# Patient Record
Sex: Male | Born: 1960 | Race: Black or African American | Hispanic: No | Marital: Single | Smoking: Former smoker
Health system: Southern US, Community
[De-identification: ages and names within clinical notes are randomized; demographics above are authoritative.]

## PROBLEM LIST (undated history)

## (undated) DIAGNOSIS — M1990 Unspecified osteoarthritis, unspecified site: Secondary | ICD-10-CM

## (undated) DIAGNOSIS — E119 Type 2 diabetes mellitus without complications: Secondary | ICD-10-CM

## (undated) DIAGNOSIS — I10 Essential (primary) hypertension: Secondary | ICD-10-CM

## (undated) DIAGNOSIS — K219 Gastro-esophageal reflux disease without esophagitis: Secondary | ICD-10-CM

## (undated) DIAGNOSIS — F419 Anxiety disorder, unspecified: Secondary | ICD-10-CM

## (undated) DIAGNOSIS — E785 Hyperlipidemia, unspecified: Secondary | ICD-10-CM

## (undated) DIAGNOSIS — Z449 Encounter for fitting and adjustment of unspecified external prosthetic device: Secondary | ICD-10-CM

## (undated) DIAGNOSIS — M8580 Other specified disorders of bone density and structure, unspecified site: Secondary | ICD-10-CM

## (undated) DIAGNOSIS — G709 Myoneural disorder, unspecified: Secondary | ICD-10-CM

## (undated) DIAGNOSIS — T7840XA Allergy, unspecified, initial encounter: Secondary | ICD-10-CM

## (undated) DIAGNOSIS — K759 Inflammatory liver disease, unspecified: Secondary | ICD-10-CM

## (undated) HISTORY — DX: Other specified disorders of bone density and structure, unspecified site: M85.80

## (undated) HISTORY — DX: Anxiety disorder, unspecified: F41.9

## (undated) HISTORY — PX: OTHER SURGICAL HISTORY: SHX169

## (undated) HISTORY — DX: Encounter for fitting and adjustment of unspecified external prosthetic device: Z44.9

## (undated) HISTORY — DX: Gastro-esophageal reflux disease without esophagitis: K21.9

## (undated) HISTORY — DX: Unspecified osteoarthritis, unspecified site: M19.90

## (undated) HISTORY — DX: Myoneural disorder, unspecified: G70.9

## (undated) HISTORY — PX: HERNIA REPAIR: SHX51

## (undated) HISTORY — DX: Hyperlipidemia, unspecified: E78.5

## (undated) HISTORY — DX: Allergy, unspecified, initial encounter: T78.40XA

---

## 2012-05-18 ENCOUNTER — Emergency Department (HOSPITAL_BASED_OUTPATIENT_CLINIC_OR_DEPARTMENT_OTHER)
Admission: EM | Admit: 2012-05-18 | Discharge: 2012-05-19 | Disposition: A | Discharge status: Home / Self Care | Payer: Medicare HMO | Attending: Emergency Medicine | Admitting: Emergency Medicine

## 2012-05-18 ENCOUNTER — Emergency Department (HOSPITAL_BASED_OUTPATIENT_CLINIC_OR_DEPARTMENT_OTHER): Payer: Medicare HMO

## 2012-05-18 ENCOUNTER — Encounter (HOSPITAL_BASED_OUTPATIENT_CLINIC_OR_DEPARTMENT_OTHER): Payer: Self-pay | Admitting: *Deleted

## 2012-05-18 DIAGNOSIS — E1049 Type 1 diabetes mellitus with other diabetic neurological complication: Secondary | ICD-10-CM | POA: Insufficient documentation

## 2012-05-18 DIAGNOSIS — G629 Polyneuropathy, unspecified: Secondary | ICD-10-CM

## 2012-05-18 DIAGNOSIS — I10 Essential (primary) hypertension: Secondary | ICD-10-CM | POA: Insufficient documentation

## 2012-05-18 DIAGNOSIS — Z79899 Other long term (current) drug therapy: Secondary | ICD-10-CM | POA: Insufficient documentation

## 2012-05-18 DIAGNOSIS — R7989 Other specified abnormal findings of blood chemistry: Secondary | ICD-10-CM | POA: Insufficient documentation

## 2012-05-18 DIAGNOSIS — E1142 Type 2 diabetes mellitus with diabetic polyneuropathy: Secondary | ICD-10-CM | POA: Insufficient documentation

## 2012-05-18 DIAGNOSIS — Z87891 Personal history of nicotine dependence: Secondary | ICD-10-CM | POA: Insufficient documentation

## 2012-05-18 DIAGNOSIS — E1069 Type 1 diabetes mellitus with other specified complication: Secondary | ICD-10-CM | POA: Insufficient documentation

## 2012-05-18 DIAGNOSIS — R739 Hyperglycemia, unspecified: Secondary | ICD-10-CM

## 2012-05-18 HISTORY — DX: Type 2 diabetes mellitus without complications: E11.9

## 2012-05-18 HISTORY — DX: Essential (primary) hypertension: I10

## 2012-05-18 LAB — GLUCOSE, CAPILLARY
Glucose-Capillary: 276 mg/dL — ABNORMAL HIGH (ref 70–99)
Glucose-Capillary: 343 mg/dL — ABNORMAL HIGH (ref 70–99)
Glucose-Capillary: 458 mg/dL — ABNORMAL HIGH (ref 70–99)
Glucose-Capillary: 471 mg/dL — ABNORMAL HIGH (ref 70–99)

## 2012-05-18 LAB — RAPID URINE DRUG SCREEN, HOSP PERFORMED
Amphetamines: NOT DETECTED
Benzodiazepines: NOT DETECTED
Cocaine: NOT DETECTED
Opiates: POSITIVE — AB

## 2012-05-18 LAB — COMPREHENSIVE METABOLIC PANEL
Albumin: 3.6 g/dL (ref 3.5–5.2)
Alkaline Phosphatase: 104 U/L (ref 39–117)
BUN: 14 mg/dL (ref 6–23)
Potassium: 3.9 mEq/L (ref 3.5–5.1)
Total Protein: 8.4 g/dL — ABNORMAL HIGH (ref 6.0–8.3)

## 2012-05-18 LAB — URINALYSIS, ROUTINE W REFLEX MICROSCOPIC
Glucose, UA: >1000 mg/dL — AB
Ketones, ur: 15 mg/dL — AB
Protein, ur: 100 mg/dL — AB

## 2012-05-18 LAB — CBC WITH DIFFERENTIAL/PLATELET
Basophils Absolute: 0.1 10*3/uL (ref 0.0–0.1)
Eosinophils Absolute: 0.1 10*3/uL (ref 0.0–0.7)
HCT: 38 % — ABNORMAL LOW (ref 39.0–52.0)
Lymphocytes Relative: 22 % (ref 12–46)
MCHC: 35 g/dL (ref 30.0–36.0)
Neutro Abs: 3.3 10*3/uL (ref 1.7–7.7)
Neutrophils Relative %: 59 % (ref 43–77)
RDW: 12.3 % (ref 11.5–15.5)

## 2012-05-18 LAB — ETHANOL: Alcohol, Ethyl (B): <11 mg/dL (ref 0–11)

## 2012-05-18 MED ORDER — OXYCODONE HCL 5 MG PO TABS: 5.0000 mg | ORAL_TABLET | ORAL | Status: DC | PRN

## 2012-05-18 MED ORDER — SODIUM CHLORIDE 0.9 % IV SOLN
1000.0000 mL | Freq: Once | INTRAVENOUS | Status: AC
  Administered 2012-05-18: 1000 mL via INTRAVENOUS

## 2012-05-18 MED ORDER — SODIUM CHLORIDE 0.9 % IV SOLN
Freq: Once | INTRAVENOUS | Status: AC
  Administered 2012-05-18: 1000 mL via INTRAVENOUS

## 2012-05-18 MED ORDER — SODIUM CHLORIDE 0.9 % IV SOLN
INTRAVENOUS | Status: DC
  Administered 2012-05-18: 100 [IU]/h via INTRAVENOUS

## 2012-05-18 MED ORDER — INSULIN REGULAR HUMAN 100 UNIT/ML IJ SOLN
INTRAMUSCULAR | Status: AC
  Filled 2012-05-18: qty 10

## 2012-05-18 MED ORDER — SODIUM CHLORIDE 0.9 % IV SOLN
1000.0000 mL | INTRAVENOUS | Status: DC
  Administered 2012-05-19: 1000 mL via INTRAVENOUS

## 2012-05-18 NOTE — ED Provider Notes (Signed)
History     CSN: [id number]  Arrival date & time 05/18/12  1758   First MD Initiated Contact with Patient 05/18/12 1814      Chief Complaint  Patient presents with  . Hyperglycemia    (Consider location/radiation/quality/duration/timing/severity/associated sxs/prior treatment) Patient is a 51 y.o. male presenting with diabetes problem. The history is provided by the patient. No language interpreter was used.  Diabetes He presents for his initial diabetic visit. He has type 1 diabetes mellitus.  Pt reports his monitor is reading high.   Pt reports he has been losing weight.   Pt reports he is having a lot of pain from nerve damage in his legs.  (Pt is in between doctors)   [facility] but [facility] will not manage his medications any longer (?dismissed)  He is scheduled to see [place] MD in November.  Family member reports pt is suffering in pain and has been confused and having trouble walking.   (Pt reports he can not be admitted because he has court tomorrow and has to appear  Past Medical History  Diagnosis Date  . Diabetes mellitus without complication   . Hypertension     History reviewed. No pertinent past surgical history.  No family history on file.  History  Substance Use Topics  . Smoking status: Former Games developer  . Smokeless tobacco: Not on file  . Alcohol Use: Yes      Review of Systems  All other systems reviewed and are negative.    Allergies  Review of patient's allergies indicates no known allergies.  Home Medications   Current Outpatient Rx  Name Route Sig Dispense Refill  . ALPRAZOLAM 1 MG PO TABS Oral Take 1 mg by mouth at bedtime as needed.    Marland Kitchen AMLODIPINE BESYLATE 10 MG PO TABS Oral Take 10 mg by mouth daily.    . FUROSEMIDE 20 MG PO TABS Oral Take 20 mg by mouth 2 (two) times daily.    . MELOXICAM 15 MG PO TABS Oral Take 15 mg by mouth daily.    Marland Kitchen METFORMIN HCL 1000 MG PO TABS Oral Take 1,000 mg by mouth 2 (two) times  daily with a meal.    . OXYCONTIN PO Oral Take by mouth.    . OXYCODONE HCL PO Oral Take by mouth.    . VENLAFAXINE HCL 75 MG PO TABS Oral Take 75 mg by mouth 2 (two) times daily.      BP 119/84  Pulse 95  Temp 98.5 F (36.9 C) (Oral)  Resp 20  SpO2 100%  Physical Exam  Nursing note and vitals reviewed. Constitutional: He is oriented to person, place, and time. He appears well-developed and well-nourished.  HENT:  Head: Normocephalic and atraumatic.  Right Ear: External ear normal.  Left Ear: External ear normal.  Nose: Nose normal.  Mouth/Throat: Oropharynx is clear and moist.  Eyes: Conjunctivae normal and EOM are normal. Pupils are equal, round, and reactive to light.  Neck: Normal range of motion. Neck supple.  Cardiovascular: Normal rate and regular rhythm.   Pulmonary/Chest: Effort normal and breath sounds normal.  Abdominal: Soft. Bowel sounds are normal.  Musculoskeletal: Normal range of motion.  Neurological: He is alert and oriented to person, place, and time. He has normal reflexes.  Skin: Skin is warm.  Psychiatric: He has a normal mood and affect.    ED Course  Procedures (including critical care time)  Labs Reviewed - No data to display  No results found.   No diagnosis found.  Results for orders placed during the hospital encounter of 05/18/12  GLUCOSE, CAPILLARY      Component Value Range   Glucose-Capillary 458 (*) 70 - 99 mg/dL   Comment 1 Notify RN     Comment 2 Documented in Chart    CBC WITH DIFFERENTIAL      Component Value Range   WBC 5.8  4.0 - 10.5 K/uL   RBC 4.21 (*) 4.22 - 5.81 MIL/uL   Hemoglobin 13.3  13.0 - 17.0 g/dL   HCT 16.1 (*) 09.6 - 04.5 %   MCV 90.3  78.0 - 100.0 fL   MCH 31.6  26.0 - 34.0 pg   MCHC 35.0  30.0 - 36.0 g/dL   RDW 40.9  81.1 - 91.4 %   Platelets 105 (*) 150 - 400 K/uL   Neutrophils Relative 59  43 - 77 %   Lymphocytes Relative 22  12 - 46 %   Monocytes Relative 17 (*) 3 - 12 %   Eosinophils Relative 1  0 -  5 %   Basophils Relative 1  0 - 1 %   Neutro Abs 3.3  1.7 - 7.7 K/uL   Lymphs Abs 1.3  0.7 - 4.0 K/uL   Monocytes Absolute 1.0  0.1 - 1.0 K/uL   Eosinophils Absolute 0.1  0.0 - 0.7 K/uL   Basophils Absolute 0.1  0.0 - 0.1 K/uL   Smear Review MORPHOLOGY UNREMARKABLE    COMPREHENSIVE METABOLIC PANEL      Component Value Range   Sodium 132 (*) 135 - 145 mEq/L   Potassium 3.9  3.5 - 5.1 mEq/L   Chloride 92 (*) 96 - 112 mEq/L   CO2 24  19 - 32 mEq/L   Glucose, Bld 496 (*) 70 - 99 mg/dL   BUN 14  6 - 23 mg/dL   Creatinine, Ser 7.82  0.50 - 1.35 mg/dL   Calcium 95.6  8.4 - 21.3 mg/dL   Total Protein 8.4 (*) 6.0 - 8.3 g/dL   Albumin 3.6  3.5 - 5.2 g/dL   AST 91 (*) 0 - 37 U/L   ALT 140 (*) 0 - 53 U/L   Alkaline Phosphatase 104  39 - 117 U/L   Total Bilirubin 1.8 (*) 0.3 - 1.2 mg/dL   GFR calc non Af Amer 86 (*) >90 mL/min   GFR calc Af Amer >90  >90 mL/min  ETHANOL      Component Value Range   Alcohol, Ethyl (B) <11  0 - 11 mg/dL  URINE RAPID DRUG SCREEN (HOSP PERFORMED)      Component Value Range   Opiates POSITIVE (*) NONE DETECTED   Cocaine NONE DETECTED  NONE DETECTED   Benzodiazepines NONE DETECTED  NONE DETECTED   Amphetamines NONE DETECTED  NONE DETECTED   Tetrahydrocannabinol POSITIVE (*) NONE DETECTED   Barbiturates NONE DETECTED  NONE DETECTED  URINALYSIS, ROUTINE W REFLEX MICROSCOPIC      Component Value Range   Color, Urine AMBER (*) YELLOW   APPearance CLEAR  CLEAR   Specific Gravity, Urine 1.039 (*) 1.005 - 1.030   pH 5.5  5.0 - 8.0   Glucose, UA >1000 (*) NEGATIVE mg/dL   Hgb urine dipstick MODERATE (*) NEGATIVE   Bilirubin Urine SMALL (*) NEGATIVE   Ketones, ur 15 (*) NEGATIVE mg/dL   Protein, ur 086 (*) NEGATIVE mg/dL   Urobilinogen, UA 1.0  0.0 - 1.0 mg/dL  Nitrite NEGATIVE  NEGATIVE   Leukocytes, UA NEGATIVE  NEGATIVE  URINE MICROSCOPIC-ADD ON      Component Value Range   Squamous Epithelial / LPF RARE  RARE   RBC / HPF 7-10  <3 RBC/hpf   Bacteria,  UA RARE  RARE   Ct Head Wo Contrast  05/18/2012  *RADIOLOGY REPORT*  Clinical Data: Mental confusion.  CT HEAD WITHOUT CONTRAST  Technique:  Contiguous axial images were obtained from the base of the skull through the vertex without contrast.  Comparison: None.  Findings: Mild generalized atrophy is present.  Periventricular and subcortical white matter hypoattenuation is advanced for age.  No acute cortical infarct, hemorrhage, or mass lesion is present.  The ventricles are of normal size.  No significant extra-axial fluid collection is present.  The left maxillary sinus and anterior ethmoid air cells are opacified and expanded.  The paranasal sinuses are otherwise clear.  The osseous skull is intact.  IMPRESSION:  1.  Mild atrophy and white matter disease is advanced for age. 2.  No acute intracranial abnormality. 3.  Opacified and enlarged left maxillary sinus raises the possibility of a mucocele.   Original Report Authenticated By: Jamesetta Orleans. MATTERN, M.D.     Date: 05/18/2012  Rate: 75  Rhythm: normal sinus rhythm  QRS Axis: normal  Intervals: normal  ST/T Wave abnormalities: normal  Conduction Disutrbances:none  Narrative Interpretation:   Old EKG Reviewed: none available   MDM  IV ns, glucomander started.  Pt tolerating po fluids and eating here.   I advised pt  He needs to follow up with primary care.   I will give pt an rx for 10 oxycodone.   Pt advised of increased lft.   Pt advised to avoid tylenol and alcohol.   I advised family to call [facility] to attempt an earlier appointment.         Lonia Skinner [place], [place] 05/18/12 2122  Elson Areas, [place] 05/18/12 [address]  Lonia Skinner [place], [place] 05/18/12 2202

## 2012-05-18 NOTE — ED Notes (Addendum)
High blood sugar at home. Yesterday it read high. Not sleeping or eating. Ran out of his medication 2 weeks ago. He is confused and aggressive at triage.

## 2012-05-18 NOTE — ED Notes (Signed)
CBG 458. RN and PA notified at bedside.

## 2012-05-19 LAB — GLUCOSE, CAPILLARY: Glucose-Capillary: 213 mg/dL — ABNORMAL HIGH (ref 70–99)

## 2012-05-19 NOTE — ED Provider Notes (Signed)
Medical screening examination/treatment/procedure(s) were conducted as a shared visit with non-physician practitioner(s) and myself.  I personally evaluated the patient during the encounter  CRITICAL CARE Performed by: Shelda Jakes.   Total critical care time: 30  Critical care time was exclusive of separately billable procedures and treating other patients.  Critical care was necessary to treat or prevent imminent or life-threatening deterioration.  Critical care was time spent personally by me on the following activities: development of treatment plan with patient and/or surrogate as well as nursing, discussions with consultants, evaluation of patient's response to treatment, examination of patient, obtaining history from patient or surrogate, ordering and performing treatments and interventions, ordering and review of laboratory studies, ordering and review of radiographic studies, pulse oximetry and re-evaluation of patient's condition.   Patient seen by me. High blood sugar requiring IV insulin drip for several hours to control blood sugars. They did get under control, came down to 200 range and patient stable enough for discharge.    Shelda Jakes, MD 05/19/12 1256

## 2013-07-28 ENCOUNTER — Encounter (HOSPITAL_BASED_OUTPATIENT_CLINIC_OR_DEPARTMENT_OTHER): Payer: Self-pay | Admitting: Emergency Medicine

## 2013-07-28 ENCOUNTER — Emergency Department (HOSPITAL_BASED_OUTPATIENT_CLINIC_OR_DEPARTMENT_OTHER)
Admission: EM | Admit: 2013-07-28 | Discharge: 2013-07-29 | Disposition: A | Discharge status: Home / Self Care | Payer: Medicare HMO | Attending: Emergency Medicine | Admitting: Emergency Medicine

## 2013-07-28 DIAGNOSIS — Z791 Long term (current) use of non-steroidal anti-inflammatories (NSAID): Secondary | ICD-10-CM | POA: Insufficient documentation

## 2013-07-28 DIAGNOSIS — R112 Nausea with vomiting, unspecified: Secondary | ICD-10-CM | POA: Insufficient documentation

## 2013-07-28 DIAGNOSIS — E119 Type 2 diabetes mellitus without complications: Secondary | ICD-10-CM | POA: Insufficient documentation

## 2013-07-28 DIAGNOSIS — R52 Pain, unspecified: Secondary | ICD-10-CM | POA: Insufficient documentation

## 2013-07-28 DIAGNOSIS — R1084 Generalized abdominal pain: Secondary | ICD-10-CM | POA: Insufficient documentation

## 2013-07-28 DIAGNOSIS — Z8669 Personal history of other diseases of the nervous system and sense organs: Secondary | ICD-10-CM | POA: Insufficient documentation

## 2013-07-28 DIAGNOSIS — Z87891 Personal history of nicotine dependence: Secondary | ICD-10-CM | POA: Insufficient documentation

## 2013-07-28 DIAGNOSIS — I10 Essential (primary) hypertension: Secondary | ICD-10-CM | POA: Insufficient documentation

## 2013-07-28 DIAGNOSIS — Z79899 Other long term (current) drug therapy: Secondary | ICD-10-CM | POA: Insufficient documentation

## 2013-07-28 LAB — CBC WITH DIFFERENTIAL/PLATELET
BASOS PCT: 1 % (ref 0–1)
Basophils Absolute: 0 10*3/uL (ref 0.0–0.1)
Eosinophils Absolute: 0 10*3/uL (ref 0.0–0.7)
Eosinophils Relative: 0 % (ref 0–5)
HEMATOCRIT: 31.4 % — AB (ref 39.0–52.0)
HEMOGLOBIN: 10.4 g/dL — AB (ref 13.0–17.0)
LYMPHS PCT: 14 % (ref 12–46)
Lymphs Abs: 0.6 10*3/uL — ABNORMAL LOW (ref 0.7–4.0)
MCH: 31 pg (ref 26.0–34.0)
MCHC: 33.1 g/dL (ref 30.0–36.0)
MCV: 93.7 fL (ref 78.0–100.0)
MONOS PCT: 11 % (ref 3–12)
Monocytes Absolute: 0.4 10*3/uL (ref 0.1–1.0)
NEUTROS ABS: 3.1 10*3/uL (ref 1.7–7.7)
NEUTROS PCT: 75 % (ref 43–77)
Platelets: 151 10*3/uL (ref 150–400)
RBC: 3.35 MIL/uL — ABNORMAL LOW (ref 4.22–5.81)
RDW: 11.3 % — ABNORMAL LOW (ref 11.5–15.5)
WBC: 4.1 10*3/uL (ref 4.0–10.5)

## 2013-07-28 LAB — BASIC METABOLIC PANEL
BUN: 12 mg/dL (ref 6–23)
CHLORIDE: 97 meq/L (ref 96–112)
CO2: 23 meq/L (ref 19–32)
Calcium: 9.6 mg/dL (ref 8.4–10.5)
Creatinine, Ser: 0.7 mg/dL (ref 0.50–1.35)
GFR calc Af Amer: >90 mL/min (ref >90)
GFR calc non Af Amer: >90 mL/min (ref >90)
Glucose, Bld: 127 mg/dL — ABNORMAL HIGH (ref 70–99)
POTASSIUM: 4 meq/L (ref 3.7–5.3)
Sodium: 136 mEq/L — ABNORMAL LOW (ref 137–147)

## 2013-07-28 MED ORDER — ONDANSETRON HCL 4 MG/2ML IJ SOLN
4.0000 mg | Freq: Once | INTRAMUSCULAR | Status: AC
Start: 2013-07-28 — End: 2013-07-28
  Administered 2013-07-28: 4 mg via INTRAVENOUS
  Filled 2013-07-28: qty 2

## 2013-07-28 MED ORDER — HYDROMORPHONE HCL PF 1 MG/ML IJ SOLN
INTRAMUSCULAR | Status: AC
  Administered 2013-07-28: 1 mg via INTRAVENOUS
  Filled 2013-07-28: qty 1

## 2013-07-28 MED ORDER — SODIUM CHLORIDE 0.9 % IV BOLUS (SEPSIS)
1000.0000 mL | Freq: Once | INTRAVENOUS | Status: AC
  Administered 2013-07-28: 1000 mL via INTRAVENOUS

## 2013-07-28 MED ORDER — HYDROMORPHONE HCL PF 1 MG/ML IJ SOLN
1.0000 mg | Freq: Once | INTRAMUSCULAR | Status: AC
  Administered 2013-07-28: 1 mg via INTRAVENOUS

## 2013-07-28 MED ORDER — OXYMETAZOLINE HCL 0.05 % NA SOLN
1.0000 | Freq: Once | NASAL | Status: AC
  Administered 2013-07-28: 1 via NASAL
  Filled 2013-07-28: qty 15

## 2013-07-28 NOTE — ED Notes (Signed)
Patient reports that he has had 6 episodes of vomiting since early am, also had a right sided nosebleed that has not stopped on assessment, no vomiting on arrival

## 2013-07-28 NOTE — ED Notes (Signed)
Pt's brother Lorina RabonLeonard Eggebrecht will pick pt up when he is discharged [phone]

## 2013-07-28 NOTE — ED Provider Notes (Signed)
CSN: [id number]     Arrival date & time 07/28/13  1840 History   First MD Initiated Contact with Patient 07/28/13 2247     Chief Complaint  Patient presents with  . Vomiting    (Consider location/radiation/quality/duration/timing/severity/associated sxs/prior Treatment) HPI This is a 53 year old male with history of diabetes. He has had nausea and vomiting since this morning. If not keep anything on his stomach. Eating or drinking anything causes him to vomit. He is also having loose stools. He has chronic neuropathy and has not been able to take his pain medication. He is complaining of generalized pain of moderate to severe intensity. All of the vomiting he has had a right epistaxis which has become hemostatic in the ED. He is having some mild generalized abdominal pain.  Past Medical History  Diagnosis Date  . Diabetes mellitus without complication   . Hypertension    History reviewed. No pertinent past surgical history. No family history on file. History  Substance Use Topics  . Smoking status: Former Games developermoker  . Smokeless tobacco: Not on file  . Alcohol Use: Yes    Review of Systems  All other systems reviewed and are negative.    Allergies  Review of patient's allergies indicates no known allergies.  Home Medications   Current Outpatient Rx  Name  Route  Sig  Dispense  Refill  . ALPRAZolam (XANAX) 1 MG tablet   Oral   Take 1 mg by mouth at bedtime as needed.         Marland Kitchen. oxymorphone (OPANA) 10 MG tablet   Oral   Take 10 mg by mouth every 4 (four) hours as needed for pain.         Marland Kitchen. amLODipine (NORVASC) 10 MG tablet   Oral   Take 10 mg by mouth daily.         . furosemide (LASIX) 20 MG tablet   Oral   Take 20 mg by mouth 2 (two) times daily.         . meloxicam (MOBIC) 15 MG tablet   Oral   Take 15 mg by mouth daily.         . metFORMIN (GLUCOPHAGE) 1000 MG tablet   Oral   Take 1,000 mg by mouth 2 (two) times daily with a meal.         .  OxyCODONE HCl (OXYCONTIN PO)   Oral   Take by mouth.         . OXYCODONE HCL PO   Oral   Take by mouth.         . venlafaxine (EFFEXOR) 75 MG tablet   Oral   Take 75 mg by mouth 2 (two) times daily.          BP 165/99  Pulse 103  Temp(Src) 98.2 F (36.8 C) (Oral)  Resp 18  Ht 5\' 7"  (1.702 m)  Wt 198 lb (89.812 kg)  BMI 31.00 kg/m2  SpO2 100%  Physical Exam General: Well-developed, well-nourished male in no acute distress; appearance consistent with age of record HENT: normocephalic; atraumatic; no active bleeding in nostrils Eyes: pupils equal, round and reactive to light; extraocular muscles intact Neck: supple Heart: regular rate and rhythm Lungs: clear to auscultation bilaterally Abdomen: soft; nondistended; mild diffuse tenderness; no masses or hepatosplenomegaly; bowel sounds present Extremities: No deformity; full range of motion; right BKA Neurologic: Awake, alert and oriented; motor function intact in all extremities and symmetric; no facial droop Skin: Warm and dry Psychiatric:  Normal mood and affect    ED Course  Procedures (including critical care time)   MDM   Nursing notes and vitals signs, including pulse oximetry, reviewed.  Summary of this visit's results, reviewed by myself:  Labs:  Results for orders placed during the hospital encounter of 07/28/13 (from the past 24 hour(s))  CBC WITH DIFFERENTIAL     Status: Abnormal   Collection Time    07/28/13 11:00 PM      Result Value Range   WBC 4.1  4.0 - 10.5 K/uL   RBC 3.35 (*) 4.22 - 5.81 MIL/uL   Hemoglobin 10.4 (*) 13.0 - 17.0 g/dL   HCT 11.9 (*) 14.7 - 82.9 %   MCV 93.7  78.0 - 100.0 fL   MCH 31.0  26.0 - 34.0 pg   MCHC 33.1  30.0 - 36.0 g/dL   RDW 56.2 (*) 13.0 - 86.5 %   Platelets 151  150 - 400 K/uL   Neutrophils Relative % 75  43 - 77 %   Neutro Abs 3.1  1.7 - 7.7 K/uL   Lymphocytes Relative 14  12 - 46 %   Lymphs Abs 0.6 (*) 0.7 - 4.0 K/uL   Monocytes Relative 11  3 - 12 %    Monocytes Absolute 0.4  0.1 - 1.0 K/uL   Eosinophils Relative 0  0 - 5 %   Eosinophils Absolute 0.0  0.0 - 0.7 K/uL   Basophils Relative 1  0 - 1 %   Basophils Absolute 0.0  0.0 - 0.1 K/uL  BASIC METABOLIC PANEL     Status: Abnormal   Collection Time    07/28/13 11:00 PM      Result Value Range   Sodium 136 (*) 137 - 147 mEq/L   Potassium 4.0  3.7 - 5.3 mEq/L   Chloride 97  96 - 112 mEq/L   CO2 23  19 - 32 mEq/L   Glucose, Bld 127 (*) 70 - 99 mg/dL   BUN 12  6 - 23 mg/dL   Creatinine, Ser 7.84  0.50 - 1.35 mg/dL   Calcium 9.6  8.4 - 69.6 mg/dL   GFR calc non Af Amer >90  >90 mL/min   GFR calc Af Amer >90  >90 mL/min  URINALYSIS, ROUTINE W REFLEX MICROSCOPIC     Status: Abnormal   Collection Time    07/28/13 11:00 PM      Result Value Range   Color, Urine YELLOW  YELLOW   APPearance CLEAR  CLEAR   Specific Gravity, Urine 1.013  1.005 - 1.030   pH 7.5  5.0 - 8.0   Glucose, UA NEGATIVE  NEGATIVE mg/dL   Hgb urine dipstick NEGATIVE  NEGATIVE   Bilirubin Urine NEGATIVE  NEGATIVE   Ketones, ur NEGATIVE  NEGATIVE mg/dL   Protein, ur 295 (*) NEGATIVE mg/dL   Urobilinogen, UA 2.0 (*) 0.0 - 1.0 mg/dL   Nitrite NEGATIVE  NEGATIVE   Leukocytes, UA NEGATIVE  NEGATIVE  URINE MICROSCOPIC-ADD ON     Status: None   Collection Time    07/28/13 11:00 PM      Result Value Range   RBC / HPF 0-2  <3 RBC/hpf   Urine-Other MUCOUS PRESENT     1:32 AM Drinking fluids without emesis.    Hanley Seamen, MD 07/29/13 [phone]

## 2013-07-29 LAB — URINALYSIS, ROUTINE W REFLEX MICROSCOPIC
Bilirubin Urine: NEGATIVE
Glucose, UA: NEGATIVE mg/dL
Hgb urine dipstick: NEGATIVE
Ketones, ur: NEGATIVE mg/dL
Leukocytes, UA: NEGATIVE
Nitrite: NEGATIVE
Protein, ur: 100 mg/dL — AB
Specific Gravity, Urine: 1.013 (ref 1.005–1.030)
Urobilinogen, UA: 2 mg/dL — ABNORMAL HIGH (ref 0.0–1.0)
pH: 7.5 (ref 5.0–8.0)

## 2013-07-29 LAB — URINE MICROSCOPIC-ADD ON

## 2013-07-29 MED ORDER — ALPRAZOLAM 0.5 MG PO TABS
1.0000 mg | ORAL_TABLET | Freq: Once | ORAL | Status: AC
  Administered 2013-07-29: 1 mg via ORAL
  Filled 2013-07-29: qty 2

## 2013-07-29 MED ORDER — HYDROMORPHONE HCL PF 1 MG/ML IJ SOLN
1.0000 mg | Freq: Once | INTRAMUSCULAR | Status: AC
  Administered 2013-07-29: 1 mg via INTRAVENOUS
  Filled 2013-07-29: qty 1

## 2013-07-29 MED ORDER — METOCLOPRAMIDE HCL 5 MG/ML IJ SOLN
10.0000 mg | Freq: Once | INTRAMUSCULAR | Status: AC
  Administered 2013-07-29: 10 mg via INTRAVENOUS
  Filled 2013-07-29: qty 2

## 2013-07-29 MED ORDER — ONDANSETRON 8 MG PO TBDP: 8.0000 mg | ORAL_TABLET | Freq: Three times a day (TID) | ORAL | Status: DC | PRN

## 2013-07-29 NOTE — ED Notes (Signed)
Diet Ginger Ale given. 

## 2013-07-29 NOTE — Discharge Instructions (Signed)

## 2014-08-13 DIAGNOSIS — D649 Anemia, unspecified: Secondary | ICD-10-CM | POA: Diagnosis not present

## 2014-08-13 DIAGNOSIS — D696 Thrombocytopenia, unspecified: Secondary | ICD-10-CM | POA: Diagnosis not present

## 2014-08-13 DIAGNOSIS — I10 Essential (primary) hypertension: Secondary | ICD-10-CM | POA: Diagnosis not present

## 2014-08-13 DIAGNOSIS — E1142 Type 2 diabetes mellitus with diabetic polyneuropathy: Secondary | ICD-10-CM | POA: Diagnosis not present

## 2014-08-13 DIAGNOSIS — G547 Phantom limb syndrome without pain: Secondary | ICD-10-CM | POA: Diagnosis not present

## 2014-08-28 DIAGNOSIS — H524 Presbyopia: Secondary | ICD-10-CM | POA: Diagnosis not present

## 2014-08-28 DIAGNOSIS — H40003 Preglaucoma, unspecified, bilateral: Secondary | ICD-10-CM | POA: Diagnosis not present

## 2014-08-28 DIAGNOSIS — E119 Type 2 diabetes mellitus without complications: Secondary | ICD-10-CM | POA: Diagnosis not present

## 2014-08-28 DIAGNOSIS — H2513 Age-related nuclear cataract, bilateral: Secondary | ICD-10-CM | POA: Diagnosis not present

## 2014-11-08 DIAGNOSIS — B182 Chronic viral hepatitis C: Secondary | ICD-10-CM | POA: Diagnosis not present

## 2014-11-08 DIAGNOSIS — Z89511 Acquired absence of right leg below knee: Secondary | ICD-10-CM | POA: Diagnosis not present

## 2014-11-08 DIAGNOSIS — E1142 Type 2 diabetes mellitus with diabetic polyneuropathy: Secondary | ICD-10-CM | POA: Diagnosis not present

## 2014-11-08 DIAGNOSIS — E1151 Type 2 diabetes mellitus with diabetic peripheral angiopathy without gangrene: Secondary | ICD-10-CM | POA: Diagnosis not present

## 2014-11-08 DIAGNOSIS — H40003 Preglaucoma, unspecified, bilateral: Secondary | ICD-10-CM | POA: Diagnosis not present

## 2014-11-08 DIAGNOSIS — L600 Ingrowing nail: Secondary | ICD-10-CM | POA: Diagnosis not present

## 2014-11-11 DIAGNOSIS — G894 Chronic pain syndrome: Secondary | ICD-10-CM | POA: Diagnosis not present

## 2014-11-11 DIAGNOSIS — B182 Chronic viral hepatitis C: Secondary | ICD-10-CM | POA: Diagnosis not present

## 2014-11-11 DIAGNOSIS — R0600 Dyspnea, unspecified: Secondary | ICD-10-CM | POA: Diagnosis not present

## 2014-11-11 DIAGNOSIS — R0602 Shortness of breath: Secondary | ICD-10-CM | POA: Diagnosis not present

## 2014-11-11 DIAGNOSIS — F1010 Alcohol abuse, uncomplicated: Secondary | ICD-10-CM | POA: Diagnosis not present

## 2014-11-11 DIAGNOSIS — E1151 Type 2 diabetes mellitus with diabetic peripheral angiopathy without gangrene: Secondary | ICD-10-CM | POA: Diagnosis not present

## 2014-11-11 DIAGNOSIS — R5081 Fever presenting with conditions classified elsewhere: Secondary | ICD-10-CM | POA: Diagnosis not present

## 2014-11-11 DIAGNOSIS — A403 Sepsis due to Streptococcus pneumoniae: Secondary | ICD-10-CM | POA: Diagnosis not present

## 2014-11-11 DIAGNOSIS — E1165 Type 2 diabetes mellitus with hyperglycemia: Secondary | ICD-10-CM | POA: Diagnosis not present

## 2014-11-11 DIAGNOSIS — E876 Hypokalemia: Secondary | ICD-10-CM | POA: Diagnosis not present

## 2014-11-11 DIAGNOSIS — G8929 Other chronic pain: Secondary | ICD-10-CM | POA: Diagnosis not present

## 2014-11-11 DIAGNOSIS — J189 Pneumonia, unspecified organism: Secondary | ICD-10-CM | POA: Diagnosis not present

## 2014-11-11 DIAGNOSIS — R40242 Glasgow coma scale score 9-12: Secondary | ICD-10-CM | POA: Diagnosis not present

## 2014-11-11 DIAGNOSIS — E669 Obesity, unspecified: Secondary | ICD-10-CM | POA: Diagnosis not present

## 2014-11-11 DIAGNOSIS — F10288 Alcohol dependence with other alcohol-induced disorder: Secondary | ICD-10-CM | POA: Diagnosis not present

## 2014-11-11 DIAGNOSIS — Z89511 Acquired absence of right leg below knee: Secondary | ICD-10-CM | POA: Diagnosis not present

## 2014-11-11 DIAGNOSIS — R509 Fever, unspecified: Secondary | ICD-10-CM | POA: Diagnosis not present

## 2014-11-11 DIAGNOSIS — E871 Hypo-osmolality and hyponatremia: Secondary | ICD-10-CM | POA: Diagnosis not present

## 2014-11-11 DIAGNOSIS — Z9114 Patient's other noncompliance with medication regimen: Secondary | ICD-10-CM | POA: Diagnosis not present

## 2014-11-11 DIAGNOSIS — E1310 Other specified diabetes mellitus with ketoacidosis without coma: Secondary | ICD-10-CM | POA: Diagnosis not present

## 2014-11-11 DIAGNOSIS — B1920 Unspecified viral hepatitis C without hepatic coma: Secondary | ICD-10-CM | POA: Diagnosis not present

## 2014-11-11 DIAGNOSIS — Z6831 Body mass index (BMI) 31.0-31.9, adult: Secondary | ICD-10-CM | POA: Diagnosis not present

## 2014-11-11 DIAGNOSIS — R6520 Severe sepsis without septic shock: Secondary | ICD-10-CM | POA: Diagnosis not present

## 2014-11-11 DIAGNOSIS — R4182 Altered mental status, unspecified: Secondary | ICD-10-CM | POA: Diagnosis not present

## 2014-11-11 DIAGNOSIS — R739 Hyperglycemia, unspecified: Secondary | ICD-10-CM | POA: Diagnosis not present

## 2014-11-11 DIAGNOSIS — A419 Sepsis, unspecified organism: Secondary | ICD-10-CM | POA: Diagnosis not present

## 2014-11-18 DIAGNOSIS — E1142 Type 2 diabetes mellitus with diabetic polyneuropathy: Secondary | ICD-10-CM | POA: Diagnosis not present

## 2014-11-18 DIAGNOSIS — R300 Dysuria: Secondary | ICD-10-CM | POA: Diagnosis not present

## 2014-11-20 DIAGNOSIS — E1142 Type 2 diabetes mellitus with diabetic polyneuropathy: Secondary | ICD-10-CM | POA: Diagnosis not present

## 2014-11-20 DIAGNOSIS — E1151 Type 2 diabetes mellitus with diabetic peripheral angiopathy without gangrene: Secondary | ICD-10-CM | POA: Diagnosis not present

## 2014-11-20 DIAGNOSIS — Z89511 Acquired absence of right leg below knee: Secondary | ICD-10-CM | POA: Diagnosis not present

## 2014-11-25 DIAGNOSIS — E11618 Type 2 diabetes mellitus with other diabetic arthropathy: Secondary | ICD-10-CM | POA: Diagnosis not present

## 2014-11-25 DIAGNOSIS — H40003 Preglaucoma, unspecified, bilateral: Secondary | ICD-10-CM | POA: Diagnosis not present

## 2014-11-25 DIAGNOSIS — D649 Anemia, unspecified: Secondary | ICD-10-CM | POA: Diagnosis not present

## 2014-11-25 DIAGNOSIS — B182 Chronic viral hepatitis C: Secondary | ICD-10-CM | POA: Diagnosis not present

## 2014-11-25 DIAGNOSIS — E119 Type 2 diabetes mellitus without complications: Secondary | ICD-10-CM | POA: Diagnosis not present

## 2014-12-13 DIAGNOSIS — Z89511 Acquired absence of right leg below knee: Secondary | ICD-10-CM | POA: Diagnosis not present

## 2014-12-13 DIAGNOSIS — F411 Generalized anxiety disorder: Secondary | ICD-10-CM | POA: Diagnosis not present

## 2014-12-13 DIAGNOSIS — M545 Low back pain: Secondary | ICD-10-CM | POA: Diagnosis not present

## 2014-12-13 DIAGNOSIS — I10 Essential (primary) hypertension: Secondary | ICD-10-CM | POA: Diagnosis not present

## 2014-12-13 DIAGNOSIS — J189 Pneumonia, unspecified organism: Secondary | ICD-10-CM | POA: Diagnosis not present

## 2014-12-17 DIAGNOSIS — B182 Chronic viral hepatitis C: Secondary | ICD-10-CM | POA: Diagnosis not present

## 2014-12-24 ENCOUNTER — Telehealth: Payer: Self-pay | Admitting: *Deleted

## 2014-12-24 DIAGNOSIS — E119 Type 2 diabetes mellitus without complications: Secondary | ICD-10-CM | POA: Diagnosis not present

## 2014-12-24 DIAGNOSIS — K8020 Calculus of gallbladder without cholecystitis without obstruction: Secondary | ICD-10-CM | POA: Diagnosis not present

## 2014-12-24 DIAGNOSIS — B182 Chronic viral hepatitis C: Secondary | ICD-10-CM | POA: Diagnosis not present

## 2014-12-24 NOTE — Telephone Encounter (Signed)
Unable to reach patient at time of Pre-Visit Call.  Left message for patient to return call when available.    

## 2014-12-25 ENCOUNTER — Telehealth: Payer: Self-pay | Admitting: Physician Assistant

## 2014-12-25 ENCOUNTER — Encounter: Payer: Self-pay | Admitting: Physician Assistant

## 2014-12-25 ENCOUNTER — Ambulatory Visit (INDEPENDENT_AMBULATORY_CARE_PROVIDER_SITE_OTHER): Payer: Medicare Other | Admitting: Physician Assistant

## 2014-12-25 VITALS — BP 124/80 | HR 94 | Temp 98.1°F | Ht 67.0 in | Wt 183.4 lb

## 2014-12-25 DIAGNOSIS — E785 Hyperlipidemia, unspecified: Secondary | ICD-10-CM | POA: Diagnosis not present

## 2014-12-25 DIAGNOSIS — M545 Low back pain, unspecified: Secondary | ICD-10-CM

## 2014-12-25 DIAGNOSIS — I10 Essential (primary) hypertension: Secondary | ICD-10-CM | POA: Diagnosis not present

## 2014-12-25 DIAGNOSIS — E119 Type 2 diabetes mellitus without complications: Secondary | ICD-10-CM

## 2014-12-25 DIAGNOSIS — G8929 Other chronic pain: Secondary | ICD-10-CM

## 2014-12-25 MED ORDER — CARISOPRODOL 350 MG PO TABS: 350.0000 mg | ORAL_TABLET | Freq: Three times a day (TID) | ORAL | Status: DC

## 2014-12-25 MED ORDER — VENLAFAXINE HCL 75 MG PO TABS
75.0000 mg | ORAL_TABLET | Freq: Two times a day (BID) | ORAL | Status: DC
Start: 2014-12-25 — End: 2015-03-25

## 2014-12-25 MED ORDER — OXYCODONE HCL 10 MG PO TABS: 10.0000 mg | ORAL_TABLET | Freq: Three times a day (TID) | ORAL | Status: DC | PRN

## 2014-12-25 MED ORDER — ALPRAZOLAM 1 MG PO TABS: 1.0000 mg | ORAL_TABLET | Freq: Every evening | ORAL | Status: DC | PRN

## 2014-12-25 MED ORDER — GABAPENTIN 300 MG PO CAPS
300.0000 mg | ORAL_CAPSULE | Freq: Three times a day (TID) | ORAL | Status: DC
Start: 2014-12-25 — End: 2014-12-28

## 2014-12-25 NOTE — Progress Notes (Signed)
Pre visit review using our clinic review tool, if applicable. No additional management support is needed unless otherwise documented below in the visit note. 

## 2014-12-25 NOTE — Telephone Encounter (Signed)
Relation to pt: self Call back number: [phone] Pharmacy: [id number] [place], [place] - [id number] [address] [phone] (Phone) [phone])         Reason for call:  Pt is currently at the pharmacy requesting a refill ALPRAZolam (XANAX) 1 MG tablet

## 2014-12-25 NOTE — Telephone Encounter (Signed)
error:[id number] ° °

## 2014-12-25 NOTE — Telephone Encounter (Signed)
Ok to send in 30 day supply.  Please call patient and let him know that he will have to sign a CSC and give UDs before any further refills will be given.

## 2014-12-25 NOTE — Telephone Encounter (Signed)
Prescriptions faxed to the pharmacy.//AB/CMA

## 2014-12-25 NOTE — Telephone Encounter (Signed)
Please advise.//AB/CMA 
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
## 2014-12-25 NOTE — Patient Instructions (Signed)
Please restart your medications as directed.  You will be contacted by General Surgery regarding your hernia and by Neurology/Pain Management regarding chronic back pain.  Follow-up in 1 month.

## 2014-12-28 ENCOUNTER — Other Ambulatory Visit: Payer: Self-pay | Admitting: Physician Assistant

## 2014-12-29 DIAGNOSIS — E785 Hyperlipidemia, unspecified: Secondary | ICD-10-CM | POA: Insufficient documentation

## 2014-12-29 DIAGNOSIS — G8929 Other chronic pain: Secondary | ICD-10-CM | POA: Insufficient documentation

## 2014-12-29 DIAGNOSIS — M545 Low back pain: Principal | ICD-10-CM

## 2014-12-29 DIAGNOSIS — I10 Essential (primary) hypertension: Secondary | ICD-10-CM | POA: Insufficient documentation

## 2014-12-29 DIAGNOSIS — E119 Type 2 diabetes mellitus without complications: Secondary | ICD-10-CM | POA: Insufficient documentation

## 2014-12-29 NOTE — Assessment & Plan Note (Signed)
Discussed with patient we are not a pain management clinic and that we do not prescribe Opana. Will allow refills of Oxycodone until we can get him in with pain Specialist. He is aware if we have to fill more than once he will be subject to urine drug screens. Oxycodone refilled.  Continue Soma and Gabapentin as directed. Referral placed to pain management.

## 2014-12-29 NOTE — Addendum Note (Signed)
Addended by: Marcelline MatesMARTIN, Graylee Arutyunyan on: 12/29/2014 07:13 PM   Modules accepted: Orders

## 2014-12-29 NOTE — Assessment & Plan Note (Signed)
Tolerating statin without myalgias.  Continue the same.  Dietary measures discussed with patient.

## 2014-12-29 NOTE — Assessment & Plan Note (Signed)
Testing supplies refilled.  Will test once daily. Continue current regimen.  Will follow-up in 1 month for CPE.

## 2014-12-29 NOTE — Assessment & Plan Note (Signed)
Stable.  Asymptomatic. Continue current regimen.

## 2015-01-24 ENCOUNTER — Ambulatory Visit (INDEPENDENT_AMBULATORY_CARE_PROVIDER_SITE_OTHER): Payer: Medicare Other | Admitting: Physician Assistant

## 2015-01-24 ENCOUNTER — Encounter: Payer: Self-pay | Admitting: Physician Assistant

## 2015-01-24 VITALS — BP 124/77 | HR 101 | Temp 98.0°F | Ht 67.0 in | Wt 191.2 lb

## 2015-01-24 DIAGNOSIS — G8929 Other chronic pain: Secondary | ICD-10-CM | POA: Diagnosis not present

## 2015-01-24 DIAGNOSIS — M545 Low back pain: Secondary | ICD-10-CM | POA: Diagnosis not present

## 2015-01-24 MED ORDER — OXYCODONE HCL 10 MG PO TABS: 10.0000 mg | ORAL_TABLET | Freq: Three times a day (TID) | ORAL | Status: DC | PRN

## 2015-01-24 MED ORDER — GABAPENTIN 300 MG PO CAPS: 600.0000 mg | ORAL_CAPSULE | Freq: Three times a day (TID) | ORAL | Status: DC

## 2015-01-24 NOTE — Progress Notes (Signed)
Pre visit review using our clinic review tool, if applicable. No additional management support is needed unless otherwise documented below in the visit note. 

## 2015-01-24 NOTE — Patient Instructions (Signed)
Please continue medications as directed, taking the Gabapentin 2 tablet three times daily. We will contact you regarding hold-up on Pain medicine referral. Follow-up 3 months.

## 2015-01-24 NOTE — Progress Notes (Signed)
Patient presents to clinic today for one-month follow-up regarding chronic low back pain. Referral to pain management was placed at last visit. Appointment still pending. Patient endorses taking his oxycodone 3 times daily as directed with relief symptoms. Endorses some days where breakthrough pain is severe. Is slowly increase his gabapentin to 600 mg 3 times daily with marked improvement in symptoms. Patient denies new symptoms or acute concerns today.  Past Medical History  Diagnosis Date  . Diabetes mellitus without complication   . Hypertension     Current Outpatient Prescriptions on File Prior to Visit  Medication Sig Dispense Refill  . ALPRAZolam (XANAX) 1 MG tablet Take 1 tablet (1 mg total) by mouth at bedtime as needed. 30 tablet 0  . amLODipine (NORVASC) 10 MG tablet Take 10 mg by mouth daily.    . B-D ULTRAFINE III SHORT PEN 31G X 8 MM MISC USE 1 NEEDLE DAILY OR AS DIRECTED.  1  . carisoprodol (SOMA) 350 MG tablet Take 1 tablet (350 mg total) by mouth 3 (three) times daily. 90 tablet 0  . Insulin Syringe-Needle U-100 (INSULIN SYRINGE 1CC/31GX5/16") 31G X 5/16" 1 ML MISC USE 1 SYRINGE D OR UTD.  0  . LANTUS SOLOSTAR 100 UNIT/ML Solostar Pen Inject 20 Units into the skin at bedtime.  0  . lisinopril (PRINIVIL,ZESTRIL) 10 MG tablet Take 10 mg by mouth daily.    . ONE TOUCH ULTRA TEST test strip 2 (two) times daily. for testing  3  . ONETOUCH DELICA LANCETS 33G MISC USE 1 LANCET BID OR AS DIRECTED.  3  . OVER THE COUNTER MEDICATION Alginate Dressing 4 x 4" Bndg-Take as directed.    Marland Kitchen oxymorphone (OPANA ER) 10 MG 12 hr tablet Take 10 mg by mouth 3 (three) times daily.    . simvastatin (ZOCOR) 10 MG tablet Take 10 mg by mouth at bedtime.    Marland Kitchen venlafaxine (EFFEXOR) 75 MG tablet Take 1 tablet (75 mg total) by mouth 2 (two) times daily. 60 tablet 1  . zolpidem (AMBIEN) 10 MG tablet Take 10 mg by mouth at bedtime.     No current facility-administered medications on file prior to  visit.    No Known Allergies  Family History  Problem Relation Age of Onset  . Diabetes Maternal Uncle   . Diabetes Maternal Grandmother     History   Social History  . Marital Status: Single    Spouse Name: N/A  . Number of Children: N/A  . Years of Education: N/A   Social History Main Topics  . Smoking status: Former Games developer  . Smokeless tobacco: Not on file  . Alcohol Use: Yes  . Drug Use: No  . Sexual Activity: Not on file   Other Topics Concern  . None   Social History Narrative   Review of Systems - See HPI.  All other ROS are negative.  BP 124/77 mmHg  Pulse 101  Temp(Src) 98 F (36.7 C) (Oral)  Ht  (1.702 m)  Wt 191 lb 3.2 oz (86.728 kg)  BMI 29.94 kg/m2  SpO2 100%  Physical Exam  Constitutional: He is oriented to person, place, and time.  Cardiovascular: Normal rate, regular rhythm, normal heart sounds and intact distal pulses.   Pulmonary/Chest: Effort normal and breath sounds normal. No respiratory distress. He has no wheezes. He has no rales. He exhibits no tenderness.  Neurological: He is alert and oriented to person, place, and time.  Skin: Skin is warm and  dry. No rash noted.  Psychiatric: Affect normal.  Vitals reviewed.   No results found for this or any previous visit (from the past 2160 hour(s)).  Assessment/Plan: Chronic low back pain Pain medicine referral still in progress. Coordinator contacted to see what the holdup is. Oxycodone refill. Rx for gabapentin 600 mg 3 times a day into pharmacy. We'll titrate up further to therapeutic effect. Patient to follow-up in 3 months.

## 2015-01-24 NOTE — Assessment & Plan Note (Signed)
Pain medicine referral still in progress. Coordinator contacted to see what the holdup is. Oxycodone refill. Rx for gabapentin 600 mg 3 times a day into pharmacy. We'll titrate up further to therapeutic effect. Patient to follow-up in 3 months.

## 2015-01-28 DIAGNOSIS — Z87891 Personal history of nicotine dependence: Secondary | ICD-10-CM | POA: Diagnosis not present

## 2015-01-28 DIAGNOSIS — I10 Essential (primary) hypertension: Secondary | ICD-10-CM | POA: Diagnosis not present

## 2015-01-28 DIAGNOSIS — B182 Chronic viral hepatitis C: Secondary | ICD-10-CM | POA: Diagnosis not present

## 2015-01-28 DIAGNOSIS — Z79899 Other long term (current) drug therapy: Secondary | ICD-10-CM | POA: Diagnosis not present

## 2015-01-28 DIAGNOSIS — Z794 Long term (current) use of insulin: Secondary | ICD-10-CM | POA: Diagnosis not present

## 2015-01-28 DIAGNOSIS — K219 Gastro-esophageal reflux disease without esophagitis: Secondary | ICD-10-CM | POA: Diagnosis not present

## 2015-01-28 DIAGNOSIS — F419 Anxiety disorder, unspecified: Secondary | ICD-10-CM | POA: Diagnosis not present

## 2015-01-28 DIAGNOSIS — G8929 Other chronic pain: Secondary | ICD-10-CM | POA: Diagnosis not present

## 2015-01-28 DIAGNOSIS — K740 Hepatic fibrosis: Secondary | ICD-10-CM | POA: Diagnosis not present

## 2015-01-28 DIAGNOSIS — G629 Polyneuropathy, unspecified: Secondary | ICD-10-CM | POA: Diagnosis not present

## 2015-01-28 DIAGNOSIS — E119 Type 2 diabetes mellitus without complications: Secondary | ICD-10-CM | POA: Diagnosis not present

## 2015-01-28 DIAGNOSIS — Z89511 Acquired absence of right leg below knee: Secondary | ICD-10-CM | POA: Diagnosis not present

## 2015-01-28 DIAGNOSIS — B181 Chronic viral hepatitis B without delta-agent: Secondary | ICD-10-CM | POA: Diagnosis not present

## 2015-01-29 ENCOUNTER — Telehealth: Payer: Self-pay | Admitting: Physician Assistant

## 2015-01-29 MED ORDER — ALPRAZOLAM 1 MG PO TABS: 1.0000 mg | ORAL_TABLET | Freq: Every evening | ORAL | Status: DC | PRN

## 2015-01-29 MED ORDER — CARISOPRODOL 350 MG PO TABS: 350.0000 mg | ORAL_TABLET | Freq: Three times a day (TID) | ORAL | Status: DC

## 2015-01-29 NOTE — Telephone Encounter (Signed)
Ok to refill xanax and Soma. Do not prescribe opana. Use the Oxycodone given and follow-up with pain management.

## 2015-01-29 NOTE — Telephone Encounter (Signed)
Caller name: Odie SeraSheldon Cohrs Relationship to patient: self Can be reached: [phone] [id number] [place] [place] - [address] [facility] [address]  Reason for call: Pt calling for refills on xanax and carisoprodol. He also states that he needs oxymorphone and didn't get a script for it. I provided pt phone # for pain mgmt since he has not yet received a call from them. He will call to schedule appt.

## 2015-01-29 NOTE — Telephone Encounter (Signed)
Requesting Xanax 1mg -Take 1 tablet by mouth at bedtime as needed.                    Carisoprodol 350mg -Take 1 tablet by mouth 3 times daily.                    Oxymorphone 10mg  12 hours-Take 1 tablet by mouth 3 times daily.   Last refill:12/25/14;#30,0                 12/25/14;#90,0                 We have not fill the Oxymorphone for him. Last OV:01/24/15 No UDS done Please advise.//AB/CMA

## 2015-01-31 NOTE — Telephone Encounter (Signed)
Called and spoke with the pt and informed him of the note below.  Pt verbalized understanding and agreed.  Informed the pt that the rx's was faxed to the pharmacy.//AB/CMA

## 2015-02-04 ENCOUNTER — Telehealth: Payer: Self-pay | Admitting: Physician Assistant

## 2015-02-04 NOTE — Telephone Encounter (Signed)
Relation to pt: self  Call back number: [phone]   Reason for call:  Patient handicap placard expired and would like another one. Please advise

## 2015-02-05 NOTE — Telephone Encounter (Signed)
Granted. Can pick up at front desk by lunch today.

## 2015-02-05 NOTE — Telephone Encounter (Signed)
[facility] and [facility] @ 3:11pm @ [phone]) informing the pt that the handicap placard was approved and place up front for him to pickup.//AB/CMA

## 2015-02-11 DIAGNOSIS — I70203 Unspecified atherosclerosis of native arteries of extremities, bilateral legs: Secondary | ICD-10-CM | POA: Diagnosis not present

## 2015-02-11 DIAGNOSIS — D62 Acute posthemorrhagic anemia: Secondary | ICD-10-CM | POA: Diagnosis not present

## 2015-02-11 DIAGNOSIS — Z89511 Acquired absence of right leg below knee: Secondary | ICD-10-CM | POA: Diagnosis not present

## 2015-02-11 DIAGNOSIS — B1920 Unspecified viral hepatitis C without hepatic coma: Secondary | ICD-10-CM | POA: Diagnosis not present

## 2015-02-11 DIAGNOSIS — G894 Chronic pain syndrome: Secondary | ICD-10-CM | POA: Diagnosis not present

## 2015-02-11 DIAGNOSIS — K625 Hemorrhage of anus and rectum: Secondary | ICD-10-CM | POA: Diagnosis not present

## 2015-02-11 DIAGNOSIS — K2970 Gastritis, unspecified, without bleeding: Secondary | ICD-10-CM | POA: Diagnosis not present

## 2015-02-11 DIAGNOSIS — N179 Acute kidney failure, unspecified: Secondary | ICD-10-CM | POA: Diagnosis not present

## 2015-02-11 DIAGNOSIS — E11618 Type 2 diabetes mellitus with other diabetic arthropathy: Secondary | ICD-10-CM | POA: Diagnosis not present

## 2015-02-11 DIAGNOSIS — K226 Gastro-esophageal laceration-hemorrhage syndrome: Secondary | ICD-10-CM | POA: Diagnosis not present

## 2015-02-11 DIAGNOSIS — F1010 Alcohol abuse, uncomplicated: Secondary | ICD-10-CM | POA: Diagnosis not present

## 2015-02-11 DIAGNOSIS — K219 Gastro-esophageal reflux disease without esophagitis: Secondary | ICD-10-CM | POA: Diagnosis not present

## 2015-02-11 DIAGNOSIS — K7030 Alcoholic cirrhosis of liver without ascites: Secondary | ICD-10-CM | POA: Diagnosis not present

## 2015-02-11 DIAGNOSIS — R579 Shock, unspecified: Secondary | ICD-10-CM | POA: Diagnosis not present

## 2015-02-11 DIAGNOSIS — R1110 Vomiting, unspecified: Secondary | ICD-10-CM | POA: Diagnosis not present

## 2015-02-11 DIAGNOSIS — G589 Mononeuropathy, unspecified: Secondary | ICD-10-CM | POA: Diagnosis not present

## 2015-02-11 DIAGNOSIS — K429 Umbilical hernia without obstruction or gangrene: Secondary | ICD-10-CM | POA: Diagnosis not present

## 2015-02-11 DIAGNOSIS — B182 Chronic viral hepatitis C: Secondary | ICD-10-CM | POA: Diagnosis not present

## 2015-02-11 DIAGNOSIS — I10 Essential (primary) hypertension: Secondary | ICD-10-CM | POA: Diagnosis not present

## 2015-02-11 DIAGNOSIS — H409 Unspecified glaucoma: Secondary | ICD-10-CM | POA: Diagnosis not present

## 2015-02-11 DIAGNOSIS — Z794 Long term (current) use of insulin: Secondary | ICD-10-CM | POA: Diagnosis not present

## 2015-02-11 DIAGNOSIS — R710 Precipitous drop in hematocrit: Secondary | ICD-10-CM | POA: Diagnosis not present

## 2015-02-11 DIAGNOSIS — F411 Generalized anxiety disorder: Secondary | ICD-10-CM | POA: Diagnosis not present

## 2015-02-11 DIAGNOSIS — D649 Anemia, unspecified: Secondary | ICD-10-CM | POA: Diagnosis not present

## 2015-02-11 DIAGNOSIS — E1142 Type 2 diabetes mellitus with diabetic polyneuropathy: Secondary | ICD-10-CM | POA: Diagnosis not present

## 2015-02-11 DIAGNOSIS — E872 Acidosis: Secondary | ICD-10-CM | POA: Diagnosis not present

## 2015-02-11 DIAGNOSIS — K922 Gastrointestinal hemorrhage, unspecified: Secondary | ICD-10-CM | POA: Diagnosis not present

## 2015-02-11 DIAGNOSIS — K920 Hematemesis: Secondary | ICD-10-CM | POA: Diagnosis not present

## 2015-02-11 DIAGNOSIS — Z9119 Patient's noncompliance with other medical treatment and regimen: Secondary | ICD-10-CM | POA: Diagnosis not present

## 2015-02-13 ENCOUNTER — Telehealth: Payer: Self-pay | Admitting: Physician Assistant

## 2015-02-13 NOTE — Telephone Encounter (Signed)
Caller name: [place] Relation to pt: Call back number: [phone] Pharmacy:  Reason for call:   Patient is requesting oxycodone and alprazolam refill.

## 2015-02-14 ENCOUNTER — Telehealth: Payer: Self-pay | Admitting: *Deleted

## 2015-02-14 NOTE — Telephone Encounter (Signed)
Transition Care Management Follow-up Telephone Call  Discharged from [facility] (stayed 2 nights) for GI bleed   Date discharged? 02/13/15    How have you been since you were released from the hospital? Patient states he is doing well, he came home and rested and has seen no more blood in his stools    Do you understand why you were in the hospital? YES- patient states he was having red stools, he was given an enema and took stool samples and the bleeding was resolved.  He states he had an endoscopy and it was clear.     Do you understand the discharge instrcutions? YES    Where were you discharged to? Home    Items Reviewed:  Medications reviewed: YES   Allergies reviewed: YES   Dietary changes reviewed: YES- patient states that he was told to eat more meat, watch fruits due to sugar but he should eat fruits and vegetables   Referrals reviewed: YES - he states he had no GI referral, was just told to follow-up with PCP    Functional Questionnaire:   Activities of Daily Living (ADLs):   He states they are independent in the following: medications, continence, feeding, ambuulation  States they require assistance with the following: none    Any transportation issues/concerns?: NO   Any patient concerns? YES- patient states he was unable to get his Ambien and would like a refill    Confirmed importance and date/time of follow-up visits scheduled YES- scheduled 02/21/15   Provider Appointment booked with Malva Cogan, PA   Confirmed with patient if condition begins to worsen call PCP or go to the ER.  Patient was given the office number and encouraged to call back with question or concerns.  : YES

## 2015-02-14 NOTE — Telephone Encounter (Signed)
Called and spoke with the pt and informed him that it's to soon to refill both meds.  Pt stated that he was not sure when he had then filled.  Pt agreed that it was to soon.//AB/CMA

## 2015-02-21 ENCOUNTER — Encounter: Payer: Self-pay | Admitting: Physician Assistant

## 2015-02-21 ENCOUNTER — Ambulatory Visit (INDEPENDENT_AMBULATORY_CARE_PROVIDER_SITE_OTHER): Payer: Medicare Other | Admitting: Physician Assistant

## 2015-02-21 VITALS — BP 108/70 | HR 104 | Temp 98.1°F | Ht 67.0 in | Wt 203.0 lb

## 2015-02-21 DIAGNOSIS — B182 Chronic viral hepatitis C: Secondary | ICD-10-CM | POA: Diagnosis not present

## 2015-02-21 DIAGNOSIS — M545 Low back pain: Secondary | ICD-10-CM | POA: Diagnosis not present

## 2015-02-21 DIAGNOSIS — K7030 Alcoholic cirrhosis of liver without ascites: Secondary | ICD-10-CM

## 2015-02-21 DIAGNOSIS — G8929 Other chronic pain: Secondary | ICD-10-CM

## 2015-02-21 DIAGNOSIS — K226 Gastro-esophageal laceration-hemorrhage syndrome: Secondary | ICD-10-CM | POA: Diagnosis not present

## 2015-02-21 MED ORDER — MORPHINE SULFATE ER 15 MG PO TBCR: 15.0000 mg | EXTENDED_RELEASE_TABLET | Freq: Two times a day (BID) | ORAL | Status: DC

## 2015-02-21 MED ORDER — OXYCODONE HCL 10 MG PO TABS: 10.0000 mg | ORAL_TABLET | Freq: Three times a day (TID) | ORAL | Status: DC | PRN

## 2015-02-21 NOTE — Patient Instructions (Signed)
Please continue chronic medications. I have added a prescription for MS Contin for you to take twice daily. This will help with pain until you see pain specialist. Do not take tylenol-containing products. Please stay well hydrated.  Please reconsider inpatient alcohol abuse treatment.  Please call [phone] for local AA groups and resources.  If you do not stop drinking, you will not be eligible for treatments/liver transplant and your risk of death from cirrhosis increases.  Follow-up with me in 1 month.

## 2015-02-21 NOTE — Progress Notes (Signed)
Pre visit review using our clinic review tool, if applicable. No additional management support is needed unless otherwise documented below in the visit note. 

## 2015-02-22 DIAGNOSIS — K7030 Alcoholic cirrhosis of liver without ascites: Secondary | ICD-10-CM | POA: Insufficient documentation

## 2015-02-22 DIAGNOSIS — B182 Chronic viral hepatitis C: Secondary | ICD-10-CM | POA: Insufficient documentation

## 2015-02-22 DIAGNOSIS — K226 Gastro-esophageal laceration-hemorrhage syndrome: Secondary | ICD-10-CM | POA: Insufficient documentation

## 2015-02-22 NOTE — Assessment & Plan Note (Signed)
Discussed treatment for alcoholism. Patient drinks > 40 oz of alcohol per day. Jaundice and icterus noted. Recommend IP treatment for alcoholism but patient declines stating he can do it own his own. Resources given. Follow-up with DR. Hurrelbrink as scheduled.

## 2015-02-22 NOTE — Progress Notes (Signed)
Justin Stone presents to clinic today for hospital follow-up. Justin Stone admitted to hospital for rectal bleeding and hematochezia. EGD and colonoscopy performed revealing Mallory-Weiss Tear. Bleeding resolved on its own and Justin Stone received IV fluid resuscitation. Liver enzymes high during stay and Justin Stone underwent biopsy giving Hep C and alcohol intake. Cirrhosis was confirmed. Justin Stone was giving follow-up with Dr. Marcelene Butte for treatment of Hep C, with instruction that he would only be treated if he stopped drinking.  Justin Stone endorses doing well since discharge. Denies nausea/vomiting, rectal bleeding, lightheadedness, dizziness or SOB. Continues to struggle with chronic pain but pain management referral still pending. Has not followed up with Dr. Marcelene Butte at present.  Past Medical History  Diagnosis Date  . Diabetes mellitus without complication   . Hypertension     Current Outpatient Prescriptions on File Prior to Visit  Medication Sig Dispense Refill  . ALPRAZolam (XANAX) 1 MG tablet Take 1 tablet (1 mg total) by mouth at bedtime as needed. 30 tablet 1  . amLODipine (NORVASC) 10 MG tablet Take 10 mg by mouth daily.    . B-D ULTRAFINE III SHORT PEN 31G X 8 MM MISC USE 1 NEEDLE DAILY OR AS DIRECTED.  1  . carisoprodol (SOMA) 350 MG tablet Take 1 tablet (350 mg total) by mouth 3 (three) times daily. 90 tablet 1  . furosemide (LASIX) 40 MG tablet Take 40 mg by mouth daily.    Marland Kitchen gabapentin (NEURONTIN) 300 MG capsule Take 2 capsules (600 mg total) by mouth 3 (three) times daily. 540 capsule 1  . Insulin Syringe-Needle U-100 (INSULIN SYRINGE 1CC/31GX5/16") 31G X 5/16" 1 ML MISC USE 1 SYRINGE D OR UTD.  0  . LANTUS SOLOSTAR 100 UNIT/ML Solostar Pen Inject 20 Units into the skin at bedtime.  0  . lisinopril (PRINIVIL,ZESTRIL) 10 MG tablet Take 10 mg by mouth daily.    . metFORMIN (GLUCOPHAGE) 500 MG tablet Take 1,000 mg by mouth 2 (two) times daily.    . ONE TOUCH ULTRA TEST test strip 2 (two)  times daily. for testing  3  . ONETOUCH DELICA LANCETS 33G MISC USE 1 LANCET BID OR AS DIRECTED.  3  . simvastatin (ZOCOR) 10 MG tablet Take 10 mg by mouth at bedtime.    Marland Kitchen venlafaxine (EFFEXOR) 75 MG tablet Take 1 tablet (75 mg total) by mouth 2 (two) times daily. 60 tablet 1  . zolpidem (AMBIEN) 10 MG tablet Take 10 mg by mouth at bedtime.     No current facility-administered medications on file prior to visit.    No Known Allergies  Family History  Problem Relation Age of Onset  . Diabetes Maternal Uncle   . Diabetes Maternal Grandmother     History   Social History  . Marital Status: Single    Spouse Name: N/A  . Number of Children: N/A  . Years of Education: N/A   Social History Main Topics  . Smoking status: Former Games developer  . Smokeless tobacco: Not on file  . Alcohol Use: Yes  . Drug Use: No  . Sexual Activity: Not on file   Other Topics Concern  . None   Social History Narrative   Review of Systems - See HPI.  All other ROS are negative.  BP 108/70 mmHg  Pulse 104  Temp(Src) 98.1 F (36.7 C) (Oral)  Ht  (1.702 m)  Wt 203 lb (92.08 kg)  BMI 31.79 kg/m2  SpO2 98%  Physical Exam  Constitutional: He is oriented to  person, place, and time and well-developed, well-nourished, and in no distress.  HENT:  Head: Normocephalic and atraumatic.  Eyes: Scleral icterus is present.  Neck: Neck supple.  Cardiovascular: Normal rate, regular rhythm, normal heart sounds and intact distal pulses.   Pulmonary/Chest: Effort normal and breath sounds normal. No respiratory distress. He has no wheezes. He has no rales. He exhibits no tenderness.  Abdominal: Soft. Bowel sounds are normal. He exhibits no distension and no mass. There is no rebound and no guarding.  Neurological: He is alert and oriented to person, place, and time.  Skin: Skin is warm and dry.  Jaundice noted.  Psychiatric: Affect normal.  Vitals reviewed.   No results found for this or any previous  visit (from the past 2160 hour(s)).  Assessment/Plan: Chronic low back pain Pain Management referral pending. No tylenol containing products giving liver function. Will refill oxycodone and will give one-time script for MS contin to help with pain until established.  Mallory-Weiss tear No active bleeding. Justin Stone doing well. Follow-up with GI as scheduled. IF anything recurs he is to return immediately or proceed to ER.  Alcoholic cirrhosis of liver without ascites Discussed treatment for alcoholism. Justin Stone drinks > 40 oz of alcohol per day. Jaundice and icterus noted. Recommend IP treatment for alcoholism but Justin Stone declines stating he can do it own his own. Resources given. Follow-up with DR. Hurrelbrink as scheduled.   Chronic hepatitis C without hepatic coma Justin Stone to follow-up with Dr. Marcelene Butte regarding treatment options. Discussed the severity of his health conditions. Justin Stone seems very laid back regarding his diagnoses.

## 2015-02-22 NOTE — Assessment & Plan Note (Signed)
Patient to follow-up with Dr. Marcelene Butte regarding treatment options. Discussed the severity of his health conditions. Patient seems very laid back regarding his diagnoses.

## 2015-02-22 NOTE — Assessment & Plan Note (Signed)
Pain Management referral pending. No tylenol containing products giving liver function. Will refill oxycodone and will give one-time script for MS contin to help with pain until established.

## 2015-02-22 NOTE — Assessment & Plan Note (Signed)
No active bleeding. Patient doing well. Follow-up with GI as scheduled. IF anything recurs he is to return immediately or proceed to ER.

## 2015-02-27 DIAGNOSIS — D62 Acute posthemorrhagic anemia: Secondary | ICD-10-CM | POA: Diagnosis not present

## 2015-02-27 DIAGNOSIS — K921 Melena: Secondary | ICD-10-CM | POA: Diagnosis not present

## 2015-02-27 DIAGNOSIS — B182 Chronic viral hepatitis C: Secondary | ICD-10-CM | POA: Diagnosis not present

## 2015-02-27 DIAGNOSIS — F1010 Alcohol abuse, uncomplicated: Secondary | ICD-10-CM | POA: Diagnosis not present

## 2015-02-27 DIAGNOSIS — K7460 Unspecified cirrhosis of liver: Secondary | ICD-10-CM | POA: Diagnosis not present

## 2015-03-18 ENCOUNTER — Telehealth: Payer: Self-pay | Admitting: *Deleted

## 2015-03-18 NOTE — Telephone Encounter (Signed)
Caller is patient's care manager [RN] for Diabetes with [employer], she was needing to know pt's last BP and A1C results. Per provider, pt is non-compliant in care for chronic conditions and therefore, has not presented to office for physical as requested by provider at [facility] and/or to lab for blood draw, so we do not have A1C results [last BP was given]/SLS

## 2015-03-21 ENCOUNTER — Telehealth: Payer: Self-pay | Admitting: Physician Assistant

## 2015-03-21 NOTE — Telephone Encounter (Signed)
Will address at visit.

## 2015-03-21 NOTE — Telephone Encounter (Signed)
Pt is asking for med refills to be ready when he comes for appt 03/24/15. Pt has requested 3 month supply of everything so he doesn't have to keep going back and forth to pharmacy.  Pt needs carisoprodol, takes 3/day - wants 90 day supply with refills Pt needs alprazolam, takes 1/night - wants 90 day supply (advised I didn't think it is permitted) Pt needs morphine, takes 2/day - wants 90 day supply (advised I didn't think it is permitted) Pt needs oxycodone, takes 3/day - wants 90 day supply (advised I didn't think it is permitted) Pt needs gabapentin, takes 2ud 3/day and sometimes more than that - advised RX is for 2ud 3/day - pt stated all meds are as needed and sometimes he needs more - I advised him to discuss with you at his next appt as a 3 month supply was sent in July and he shouldn't need a refill til beginning of October.

## 2015-03-24 ENCOUNTER — Ambulatory Visit: Payer: Medicare Other | Admitting: Physician Assistant

## 2015-03-25 ENCOUNTER — Ambulatory Visit (INDEPENDENT_AMBULATORY_CARE_PROVIDER_SITE_OTHER): Payer: Medicare Other | Admitting: Physician Assistant

## 2015-03-25 ENCOUNTER — Encounter: Payer: Self-pay | Admitting: Physician Assistant

## 2015-03-25 VITALS — BP 138/76 | HR 93 | Temp 98.0°F | Resp 18 | Ht 67.0 in | Wt 193.0 lb

## 2015-03-25 DIAGNOSIS — M545 Low back pain: Secondary | ICD-10-CM | POA: Diagnosis not present

## 2015-03-25 DIAGNOSIS — Z23 Encounter for immunization: Secondary | ICD-10-CM | POA: Diagnosis not present

## 2015-03-25 DIAGNOSIS — G8929 Other chronic pain: Secondary | ICD-10-CM

## 2015-03-25 DIAGNOSIS — E119 Type 2 diabetes mellitus without complications: Secondary | ICD-10-CM

## 2015-03-25 DIAGNOSIS — K7030 Alcoholic cirrhosis of liver without ascites: Secondary | ICD-10-CM | POA: Diagnosis not present

## 2015-03-25 DIAGNOSIS — Z89611 Acquired absence of right leg above knee: Secondary | ICD-10-CM

## 2015-03-25 MED ORDER — OXYCODONE HCL 10 MG PO TABS: 10.0000 mg | ORAL_TABLET | Freq: Three times a day (TID) | ORAL | Status: DC | PRN

## 2015-03-25 MED ORDER — CARISOPRODOL 350 MG PO TABS: 350.0000 mg | ORAL_TABLET | Freq: Three times a day (TID) | ORAL | Status: DC

## 2015-03-25 MED ORDER — MORPHINE SULFATE ER 15 MG PO TBCR: 15.0000 mg | EXTENDED_RELEASE_TABLET | Freq: Two times a day (BID) | ORAL | Status: DC

## 2015-03-25 MED ORDER — VENLAFAXINE HCL 75 MG PO TABS: 75.0000 mg | ORAL_TABLET | Freq: Two times a day (BID) | ORAL | Status: DC

## 2015-03-25 MED ORDER — ALPRAZOLAM 1 MG PO TABS: 1.0000 mg | ORAL_TABLET | Freq: Every evening | ORAL | Status: DC | PRN

## 2015-03-25 NOTE — Progress Notes (Signed)
Pre visit review using our clinic review tool, if applicable. No additional management support is needed unless otherwise documented below in the visit note/SLS  

## 2015-03-25 NOTE — Patient Instructions (Signed)
Please go to the lab for blood work. I will call you with your results. Continue medications as directed. Keep your phone on as Pain Management should be contacting you very soon for an appointment. They will take over pain medication management.  Schedule an appointment with Dr. Marcelene Butte so your Hep C can be treated. I am proud of you for cutting down on alcohol consumption. We need to try our best to fully stop drinking. It is only doing more damage to your body.

## 2015-03-26 DIAGNOSIS — E119 Type 2 diabetes mellitus without complications: Secondary | ICD-10-CM | POA: Insufficient documentation

## 2015-03-26 DIAGNOSIS — Z89511 Acquired absence of right leg below knee: Secondary | ICD-10-CM | POA: Insufficient documentation

## 2015-03-26 DIAGNOSIS — Z23 Encounter for immunization: Secondary | ICD-10-CM | POA: Insufficient documentation

## 2015-03-26 LAB — BASIC METABOLIC PANEL
BUN: 26 mg/dL — AB (ref 6–23)
CALCIUM: 9.8 mg/dL (ref 8.4–10.5)
CO2: 24 mEq/L (ref 19–32)
CREATININE: 1.16 mg/dL (ref 0.40–1.50)
Chloride: 105 mEq/L (ref 96–112)
GFR: 84.67 mL/min (ref >60.00)
GLUCOSE: 124 mg/dL — AB (ref 70–99)
Potassium: 4.2 mEq/L (ref 3.5–5.1)
Sodium: 141 mEq/L (ref 135–145)

## 2015-03-26 LAB — HEMOGLOBIN A1C: Hgb A1c MFr Bld: 4.2 % — ABNORMAL LOW (ref 4.6–6.5)

## 2015-03-26 NOTE — Assessment & Plan Note (Signed)
Stable. Medications refilled. Med management will be taken over by Pain Clinic once he establishes this month.

## 2015-03-26 NOTE — Progress Notes (Signed)
Patient presents to clinic today for 30-month follow-up of chronic low back pain. Endorses taking medications as directed. Notes doing much better with addition of extended release Morphine. Has been approved by pain management and will be seeing them in the next 1-2 weeks. Denies new or worsening symptoms. Has not followed up with Liver specialist, Dr. Marcelene Butte as directed for alcoholic cirrhosis and chronic hepatitis C. Was previously drinking a 40 oz of beer per day. Is now down to 1/2-1 8 oz can of beer per day. Denies abdominal pain or change to bowel habits.  Is also following up for diabetes mellitus, II. Is taking his metformin and Lantus as directed. Endorses fasting CBGs in the 90s. Endorses up-to-date of diabetic eye exam. Will bring in records.  Past Medical History  Diagnosis Date  . Diabetes mellitus without complication   . Hypertension     Current Outpatient Prescriptions on File Prior to Visit  Medication Sig Dispense Refill  . amLODipine (NORVASC) 10 MG tablet Take 10 mg by mouth daily.    . B-D ULTRAFINE III SHORT PEN 31G X 8 MM MISC USE 1 NEEDLE DAILY OR AS DIRECTED.  1  . furosemide (LASIX) 40 MG tablet Take 40 mg by mouth daily.    Marland Kitchen gabapentin (NEURONTIN) 300 MG capsule Take 2 capsules (600 mg total) by mouth 3 (three) times daily. 540 capsule 1  . Insulin Syringe-Needle U-100 (INSULIN SYRINGE 1CC/31GX5/16") 31G X 5/16" 1 ML MISC USE 1 SYRINGE D OR UTD.  0  . LANTUS SOLOSTAR 100 UNIT/ML Solostar Pen Inject 20 Units into the skin at bedtime.  0  . lisinopril (PRINIVIL,ZESTRIL) 10 MG tablet Take 10 mg by mouth daily.    . metFORMIN (GLUCOPHAGE) 500 MG tablet Take 1,000 mg by mouth 2 (two) times daily.    . ONE TOUCH ULTRA TEST test strip 2 (two) times daily. for testing  3  . ONETOUCH DELICA LANCETS 33G MISC USE 1 LANCET BID OR AS DIRECTED.  3  . simvastatin (ZOCOR) 10 MG tablet Take 10 mg by mouth at bedtime.    Marland Kitchen zolpidem (AMBIEN) 10 MG tablet Take 10 mg by mouth  at bedtime.     No current facility-administered medications on file prior to visit.    No Known Allergies  Family History  Problem Relation Age of Onset  . Diabetes Maternal Uncle   . Diabetes Maternal Grandmother     Social History   Social History  . Marital Status: Single    Spouse Name: N/A  . Number of Children: N/A  . Years of Education: N/A   Social History Main Topics  . Smoking status: Former Games developer  . Smokeless tobacco: None  . Alcohol Use: Yes  . Drug Use: No  . Sexual Activity: Not Asked   Other Topics Concern  . None   Social History Narrative   Review of Systems - See HPI.  All other ROS are negative.  BP 138/76 mmHg  Pulse 93  Temp(Src) 98 F (36.7 C) (Oral)  Resp 18  Ht 5\' 7"  (1.702 m)  Wt 193 lb (87.544 kg)  BMI 30.22 kg/m2  SpO2 98%  Physical Exam  Constitutional: He is oriented to person, place, and time and well-developed, well-nourished, and in no distress.  HENT:  Head: Normocephalic and atraumatic.  Eyes: Pupils are equal, round, and reactive to light. Scleral icterus is present.  Cardiovascular: Normal rate, regular rhythm, normal heart sounds and intact distal pulses.   Pulmonary/Chest:  Effort normal and breath sounds normal. No respiratory distress. He has no wheezes. He has no rales. He exhibits no tenderness.  Neurological: He is alert and oriented to person, place, and time.  Vitals reviewed.  No results found for this or any previous visit (from the past 2160 hour(s)).  Assessment/Plan: Alcoholic cirrhosis of liver without ascites Patient has reduced alcohol intake which I congratulated him on. Will now work on complete cessation. Again refuses any help on my part. Encouraged patient to schedule appointment with his Hepatologist as he is well overdue.  Chronic low back pain Stable. Medications refilled. Med management will be taken over by Pain Clinic once he establishes this month.  Diabetes mellitus type II,  controlled Continue current regimen. Will check BMP and A1C. Foot exam performed (see notes).  Need for prophylactic vaccination against Streptococcus pneumoniae (pneumococcus) Pneumovax given.

## 2015-03-26 NOTE — Assessment & Plan Note (Signed)
Patient has reduced alcohol intake which I congratulated him on. Will now work on complete cessation. Again refuses any help on my part. Encouraged patient to schedule appointment with his Hepatologist as he is well overdue.

## 2015-03-26 NOTE — Assessment & Plan Note (Signed)
Pneumovax given

## 2015-03-26 NOTE — Assessment & Plan Note (Signed)
Continue current regimen. Will check BMP and A1C. Foot exam performed (see notes).

## 2015-03-27 ENCOUNTER — Telehealth: Payer: Self-pay | Admitting: Physician Assistant

## 2015-03-27 NOTE — Telephone Encounter (Signed)
Pt was no show 03/24/15 10:15am, follow up appt, pt called and came in 03/25/15, charge for no show?

## 2015-03-27 NOTE — Telephone Encounter (Signed)
Charge. 

## 2015-04-07 ENCOUNTER — Telehealth: Payer: Self-pay | Admitting: Physician Assistant

## 2015-04-07 NOTE — Telephone Encounter (Signed)
Patient informed, understood & agreed; patient will not take supplement/SLS

## 2015-04-07 NOTE — Telephone Encounter (Signed)
Is not a prescription is an Advice worker supplement thought to increase human growth hormone which helps with metabolism/ anti-aging etc. No legit long-term data for me to review. Has potential to interact with medication regimen. I do not have many thoughts on it other than he should seriously discuss with his liver specialist for considering use.

## 2015-04-07 NOTE — Telephone Encounter (Signed)
Relation to pt: self  Call back number:  [phone] Pharmacy:  Reason for call:  Patient would like to discuss serovital hgh, would like PCP input on medication

## 2015-04-08 ENCOUNTER — Telehealth: Payer: Self-pay | Admitting: Physician Assistant

## 2015-04-08 NOTE — Telephone Encounter (Signed)
Relation to ZO:XWRU Call back number: [phone] Pharmacy:  Reason for call:  Patient states he received a VM regarding metFORMIN (GLUCOPHAGE) 500 MG tablet  medication change and would like some clarification

## 2015-04-08 NOTE — Telephone Encounter (Signed)
Called patient. No answer. LMOM for callback.

## 2015-04-08 NOTE — Telephone Encounter (Signed)
Attempted to call again. No answer -- went straight to voicemail. This is the 3rd time I have personally tried to call patient. Detailed message left on machine. If patient calls again please inform him we are stopping his Metformin for now cause his blood work looked good. He is to follow-up in 3 months to recheck his A1C off of medication.

## 2015-04-08 NOTE — Telephone Encounter (Signed)
Pt is returning your call.   CB#: [phone]

## 2015-04-08 NOTE — Telephone Encounter (Signed)
Patient received message and voice understanding

## 2015-04-09 ENCOUNTER — Encounter: Payer: Self-pay | Admitting: Physical Medicine & Rehabilitation

## 2015-04-14 ENCOUNTER — Telehealth: Payer: Self-pay | Admitting: Physician Assistant

## 2015-04-14 NOTE — Telephone Encounter (Signed)
Pt said that he was contacted by pain mgmt and is scheduled for 05/06/15. Pt is stating that he is unfamiliar with [facility], doesn't know where the place is, and drives a scooter. He is not comfortable going all the way out in [place]. He is asking if he can continue to get medications from you. Per referral notes pain mgmt notified pt they would see him for non-narcotic treatment. Pt is asking that [facility] refill pain meds for him prior to pain mgmt. He stated that he can make it to the [facility] pain clinic (He said across from [facility]). He thinks [facility]. Please notify pt of your decisions. Ph# [phone].

## 2015-04-14 NOTE — Telephone Encounter (Signed)
Returning call.

## 2015-04-14 NOTE — Telephone Encounter (Signed)
So I suggest he keep the appointment with pain management already set up until we can get him in with new pain clinic in [place] (this can take months). Again I only agreed to take over pain medicine until he was evaluated by pain specialist. Will take over until 10/11 which is his scheduled appointment.

## 2015-04-14 NOTE — Telephone Encounter (Signed)
LMOM with contact name and number [for return call, if needed] RE: results and further provider instructions/SLS  

## 2015-04-20 ENCOUNTER — Other Ambulatory Visit: Payer: Self-pay | Admitting: Physician Assistant

## 2015-04-24 DIAGNOSIS — K7030 Alcoholic cirrhosis of liver without ascites: Secondary | ICD-10-CM | POA: Diagnosis not present

## 2015-04-24 DIAGNOSIS — B182 Chronic viral hepatitis C: Secondary | ICD-10-CM | POA: Diagnosis not present

## 2015-04-25 DIAGNOSIS — K7030 Alcoholic cirrhosis of liver without ascites: Secondary | ICD-10-CM | POA: Diagnosis not present

## 2015-04-25 DIAGNOSIS — B182 Chronic viral hepatitis C: Secondary | ICD-10-CM | POA: Diagnosis not present

## 2015-04-28 ENCOUNTER — Ambulatory Visit: Payer: Medicare Other | Admitting: Physician Assistant

## 2015-04-29 ENCOUNTER — Encounter: Payer: Self-pay | Admitting: Physician Assistant

## 2015-04-29 ENCOUNTER — Ambulatory Visit (INDEPENDENT_AMBULATORY_CARE_PROVIDER_SITE_OTHER): Payer: Medicare Other | Admitting: Physician Assistant

## 2015-04-29 VITALS — BP 142/94 | HR 106 | Temp 98.0°F | Resp 16 | Ht 67.0 in | Wt 206.2 lb

## 2015-04-29 DIAGNOSIS — Z794 Long term (current) use of insulin: Secondary | ICD-10-CM

## 2015-04-29 DIAGNOSIS — Z89611 Acquired absence of right leg above knee: Secondary | ICD-10-CM | POA: Diagnosis not present

## 2015-04-29 DIAGNOSIS — M545 Low back pain, unspecified: Secondary | ICD-10-CM

## 2015-04-29 DIAGNOSIS — E1140 Type 2 diabetes mellitus with diabetic neuropathy, unspecified: Secondary | ICD-10-CM

## 2015-04-29 DIAGNOSIS — G8929 Other chronic pain: Secondary | ICD-10-CM

## 2015-04-29 DIAGNOSIS — Z23 Encounter for immunization: Secondary | ICD-10-CM

## 2015-04-29 DIAGNOSIS — I10 Essential (primary) hypertension: Secondary | ICD-10-CM | POA: Diagnosis not present

## 2015-04-29 MED ORDER — AMLODIPINE BESYLATE 10 MG PO TABS: 10.0000 mg | ORAL_TABLET | Freq: Every day | ORAL | Status: DC

## 2015-04-29 MED ORDER — MORPHINE SULFATE ER 15 MG PO TBCR: 15.0000 mg | EXTENDED_RELEASE_TABLET | Freq: Two times a day (BID) | ORAL | Status: DC

## 2015-04-29 MED ORDER — OXYCODONE HCL 10 MG PO TABS: 10.0000 mg | ORAL_TABLET | Freq: Three times a day (TID) | ORAL | Status: DC | PRN

## 2015-04-29 NOTE — Assessment & Plan Note (Signed)
Will increase Lantus by 2 units every 4 nights until fasting sugars at goal. Will remain off of Metformin. Will recheck labs in 3 months.

## 2015-04-29 NOTE — Assessment & Plan Note (Signed)
Some increased phantom limb symptoms. Continue pain regimen. Increase bedtime Gabapentin to 900 mg.

## 2015-04-29 NOTE — Progress Notes (Signed)
Patient presents to clinic today for follow-up of chronic medical medications.  Diabetes Mellitus II -- Last A1C at 4.2 on last check so Metformin was discontinued. Patient continues Lantus at 16 units daily. AM fasting sugars averaging in 130s-150s.  Hypertension -- Is taking lisinopril daily as directed. Has been out of amlodipine. Patient denies chest pain, palpitations, lightheadedness, dizziness, vision changes or frequent headaches.  Chronic Pain -- Taking medications as directed with good relief overall. Is having some phantom limb pain in this RLE that is s/p BKA. Is taking more medication than previous due to these symptoms. Has appointment with Dr. Merceda Elks on 05/06/2015 to establish care.  Past Medical History  Diagnosis Date  . Diabetes mellitus without complication (HCC)   . Hypertension     Current Outpatient Prescriptions on File Prior to Visit  Medication Sig Dispense Refill  . ALPRAZolam (XANAX) 1 MG tablet Take 1 tablet (1 mg total) by mouth at bedtime as needed. 30 tablet 1  . B-D ULTRAFINE III SHORT PEN 31G X 8 MM MISC USE 1 NEEDLE DAILY OR AS DIRECTED.  1  . carisoprodol (SOMA) 350 MG tablet Take 1 tablet (350 mg total) by mouth 3 (three) times daily. 90 tablet 1  . furosemide (LASIX) 40 MG tablet Take 40 mg by mouth daily.    Marland Kitchen gabapentin (NEURONTIN) 300 MG capsule Take 2 capsules (600 mg total) by mouth 3 (three) times daily. 540 capsule 1  . Insulin Syringe-Needle U-100 (INSULIN SYRINGE 1CC/31GX5/16") 31G X 5/16" 1 ML MISC USE 1 SYRINGE D OR UTD.  0  . LANTUS SOLOSTAR 100 UNIT/ML Solostar Pen Inject 20 Units into the skin at bedtime.  0  . lisinopril (PRINIVIL,ZESTRIL) 10 MG tablet Take 10 mg by mouth daily.    . ONE TOUCH ULTRA TEST test strip 2 (two) times daily. for testing  3  . ONETOUCH DELICA LANCETS 33G MISC USE 1 LANCET BID OR AS DIRECTED.  3  . simvastatin (ZOCOR) 10 MG tablet Take 10 mg by mouth at bedtime.    Marland Kitchen venlafaxine (EFFEXOR) 75 MG tablet Take  1 tablet (75 mg total) by mouth 2 (two) times daily. 180 tablet 1  . zolpidem (AMBIEN) 10 MG tablet Take 10 mg by mouth at bedtime.     No current facility-administered medications on file prior to visit.    No Known Allergies  Family History  Problem Relation Age of Onset  . Diabetes Maternal Uncle   . Diabetes Maternal Grandmother     Social History   Social History  . Marital Status: Single    Spouse Name: N/A  . Number of Children: N/A  . Years of Education: N/A   Social History Main Topics  . Smoking status: Former Games developer  . Smokeless tobacco: None  . Alcohol Use: Yes  . Drug Use: No  . Sexual Activity: Not Asked   Other Topics Concern  . None   Social History Narrative   Review of Systems - See HPI.  All other ROS are negative.  BP 142/94 mmHg  Pulse 106  Temp(Src) 98 F (36.7 C) (Oral)  Resp 16  Ht  (1.702 m)  Wt 206 lb 4 oz (93.554 kg)  BMI 32.30 kg/m2  SpO2 97%  Physical Exam  Constitutional: He is oriented to person, place, and time and well-developed, well-nourished, and in no distress.  HENT:  Head: Normocephalic and atraumatic.  Eyes: Conjunctivae are normal.  Neck: Neck supple.  Cardiovascular: Normal rate, regular  rhythm, normal heart sounds and intact distal pulses.   Pulmonary/Chest: Effort normal and breath sounds normal. No respiratory distress. He has no wheezes. He has no rales. He exhibits no tenderness.  Musculoskeletal:  S/p BKA noted. No sign of swelling, erythema, tenderness or warmth.  Neurological: He is alert and oriented to person, place, and time.  Skin: Skin is warm and dry. No rash noted.  Psychiatric: Affect normal.  Vitals reviewed.   Recent Results (from the past 2160 hour(s))  Basic Metabolic Panel (BMET)     Status: Abnormal   Collection Time: 03/25/15  2:03 PM  Result Value Ref Range   Sodium 141 135 - 145 mEq/L   Potassium 4.2 3.5 - 5.1 mEq/L   Chloride 105 96 - 112 mEq/L   CO2 24 19 - 32 mEq/L    Glucose, Bld 124 (H) 70 - 99 mg/dL   BUN 26 (H) 6 - 23 mg/dL   Creatinine, Ser 5.78 0.40 - 1.50 mg/dL   Calcium 9.8 8.4 - 46.9 mg/dL   GFR 62.95 >28.41 mL/min  Hemoglobin A1c     Status: Abnormal   Collection Time: 03/25/15  2:03 PM  Result Value Ref Range   Hgb A1c MFr Bld 4.2 (L) 4.6 - 6.5 %    Comment: Glycemic Control Guidelines for People with Diabetes:Non Diabetic:  <6%Goal of Therapy: <7%Additional Action Suggested:  >8%     Assessment/Plan: Essential hypertension, benign Amlodipine refilled. Continue medication regimen. Limit salt intake. Will routinely monitor.  Diabetes mellitus type II, controlled Will increase Lantus by 2 units every 4 nights until fasting sugars at goal. Will remain off of Metformin. Will recheck labs in 3 months.  Chronic low back pain Medications refilled. Follow-up with Dr. Kirtland Bouchard for NP appointment at pain clinic.  S/P AKA (above knee amputation) unilateral Some increased phantom limb symptoms. Continue pain regimen. Increase bedtime Gabapentin to 900 mg.

## 2015-04-29 NOTE — Assessment & Plan Note (Signed)
Amlodipine refilled. Continue medication regimen. Limit salt intake. Will routinely monitor.

## 2015-04-29 NOTE — Progress Notes (Signed)
Pre visit review using our clinic review tool, if applicable. No additional management support is needed unless otherwise documented below in the visit note/SLS  

## 2015-04-29 NOTE — Assessment & Plan Note (Signed)
Medications refilled. Follow-up with Dr. Kirtland Bouchard for NP appointment at pain clinic.

## 2015-04-29 NOTE — Patient Instructions (Addendum)
Please continue pain medications as directed but increase nighttime dose of Gabapentin to 900 mg (1.5 tablets). Continue blood pressure medications as directed.  Follow-up with Pain Management as scheduled.  For Lantus, increase by 2 units every 4 days until sugars averaging 100-120.  Follow-up 3 months.

## 2015-04-30 DIAGNOSIS — Z23 Encounter for immunization: Secondary | ICD-10-CM | POA: Diagnosis not present

## 2015-05-06 ENCOUNTER — Encounter: Payer: Medicare Other | Attending: Physical Medicine & Rehabilitation

## 2015-05-06 ENCOUNTER — Encounter: Payer: Self-pay | Admitting: Physical Medicine & Rehabilitation

## 2015-05-06 ENCOUNTER — Ambulatory Visit (HOSPITAL_BASED_OUTPATIENT_CLINIC_OR_DEPARTMENT_OTHER): Payer: Medicare Other | Admitting: Physical Medicine & Rehabilitation

## 2015-05-06 ENCOUNTER — Telehealth: Payer: Self-pay | Admitting: Physician Assistant

## 2015-05-06 VITALS — BP 133/75 | HR 101 | Resp 14

## 2015-05-06 DIAGNOSIS — M25561 Pain in right knee: Secondary | ICD-10-CM

## 2015-05-06 DIAGNOSIS — F1010 Alcohol abuse, uncomplicated: Secondary | ICD-10-CM | POA: Insufficient documentation

## 2015-05-06 DIAGNOSIS — Z87891 Personal history of nicotine dependence: Secondary | ICD-10-CM | POA: Diagnosis not present

## 2015-05-06 DIAGNOSIS — E119 Type 2 diabetes mellitus without complications: Secondary | ICD-10-CM | POA: Diagnosis not present

## 2015-05-06 DIAGNOSIS — Z79891 Long term (current) use of opiate analgesic: Secondary | ICD-10-CM | POA: Diagnosis not present

## 2015-05-06 DIAGNOSIS — M24812 Other specific joint derangements of left shoulder, not elsewhere classified: Secondary | ICD-10-CM | POA: Insufficient documentation

## 2015-05-06 DIAGNOSIS — M25511 Pain in right shoulder: Secondary | ICD-10-CM | POA: Insufficient documentation

## 2015-05-06 DIAGNOSIS — M545 Low back pain: Secondary | ICD-10-CM | POA: Diagnosis not present

## 2015-05-06 DIAGNOSIS — M25512 Pain in left shoulder: Secondary | ICD-10-CM | POA: Diagnosis not present

## 2015-05-06 DIAGNOSIS — I10 Essential (primary) hypertension: Secondary | ICD-10-CM | POA: Insufficient documentation

## 2015-05-06 DIAGNOSIS — G8929 Other chronic pain: Secondary | ICD-10-CM | POA: Insufficient documentation

## 2015-05-06 DIAGNOSIS — G546 Phantom limb syndrome with pain: Secondary | ICD-10-CM | POA: Insufficient documentation

## 2015-05-06 DIAGNOSIS — G629 Polyneuropathy, unspecified: Secondary | ICD-10-CM | POA: Insufficient documentation

## 2015-05-06 DIAGNOSIS — Z89511 Acquired absence of right leg below knee: Secondary | ICD-10-CM | POA: Diagnosis not present

## 2015-05-06 NOTE — Patient Instructions (Signed)
We will not prescribe narcotic pain medicines if you are still actively drinking.  There are alcohol treatment facilities and practices in [facility], talk to your family doctor about this  We can see you back to review x-rays of your back and shoulders, also your right knee. We discussed nerve blocks for phantom pain as well as back injections

## 2015-05-06 NOTE — Telephone Encounter (Signed)
Pt states that Pain Mgmt advised him to get xrays and he may get injections in lower back (nerve blocker). Pt said that he was told any medications/narcotics would have to be prescribed thru [facility].

## 2015-05-06 NOTE — Progress Notes (Signed)
Subjective:    Patient ID: Justin Stone, male    DOB: 11/18/1960, 54 y.o.   MRN: [id number]  HPI Reason for consult:  Low back pain  54 year old male with a greater than four-year history of low back pain. Patient has had some workup in [place] when he lived there for years ago. This included x-rays of his low back as well as his neck as well as what sounds like an EMG study.  Patient has some radiation of his back pain toward his left lower extremity. No weakness in the left leg and left foot is numb. Patient has been treated for diabetes, he is on Lantus insulin and no longer is on metformin.  Functionally he is independent using a cane as well as a right below-knee prosthesis.  Patient had an amputation, right below-knee due to diabetic ulcer diagnosed 2 or 3 years ago. Has had chronic phantom pain and is on gabapentin for this.  He has been evaluated by hepatologist who was considering medical treatment of hepatitis C. The patient was advised to stop drinking in order to start using this medication.  Social history: Still drinks on a daily basis. 40 ounces of multiple liquor per day.  Medications include MS Contin 15 mg twice a day and oxycodone 10 mg 3 times a day  Pain Inventory Average Pain 7 Pain Right Now 8 My pain is sharp, burning, stabbing and tingling  In the last 24 hours, has pain interfered with the following? General activity 4 Relation with others 1 Enjoyment of life 3 What TIME of day is your pain at its worst? evening Sleep (in general) Poor  Pain is worse with: walking Pain improves with: no selection Relief from Meds: no selection  Mobility walk without assistance walk with assistance use a cane how many minutes can you walk? 10-15 ability to climb steps?  yes  Function not employed: date last employed . disabled: date disabled .  Neuro/Psych numbness tingling spasms  Prior Studies Any changes since last visit?  no  Physicians  involved in your care Any changes since last visit?  no   Family History  Problem Relation Age of Onset  . Diabetes Maternal Uncle   . Diabetes Maternal Grandmother    Social History   Social History  . Marital Status: Single    Spouse Name: N/A  . Number of Children: N/A  . Years of Education: N/A   Social History Main Topics  . Smoking status: Former Games developer  . Smokeless tobacco: None  . Alcohol Use: Yes  . Drug Use: No  . Sexual Activity: Not Asked   Other Topics Concern  . None   Social History Narrative   History reviewed. No pertinent past surgical history. Past Medical History  Diagnosis Date  . Diabetes mellitus without complication (HCC)   . Hypertension    BP 133/75 mmHg  Pulse 101  Resp 14  SpO2 97%  Opioid Risk Score:   Fall Risk Score:  `1  Depression screen PHQ 2/9  Depression screen PHQ 2/9 05/06/2015  Decreased Interest 1  Down, Depressed, Hopeless 0  PHQ - 2 Score 1  Altered sleeping 1  Tired, decreased energy 0  Change in appetite 0  Feeling bad or failure about yourself  0  Trouble concentrating 0  Moving slowly or fidgety/restless 0  Suicidal thoughts 0  PHQ-9 Score 2     Review of Systems  Constitutional:       Tingling Spasms  Endocrine:       High blood sugar  Neurological: Positive for numbness.  All other systems reviewed and are negative.      Objective:   Physical Exam  Constitutional: He is oriented to person, place, and time. He appears well-developed and well-nourished.  HENT:  Head: Normocephalic and atraumatic.  Eyes: Conjunctivae and EOM are normal. Pupils are equal, round, and reactive to light.  Neck: Normal range of motion.  Cardiovascular: Normal rate, regular rhythm and normal heart sounds.   Pulmonary/Chest: Effort normal and breath sounds normal.  Abdominal: Soft.  Neurological: He is alert and oriented to person, place, and time.  Strength is 5/5 bilateral deltoids, biceps, triceps, grip, left  hip flexor, left knee extensor, left ankle dorsiflexor Right hip flexor and knee extensor are 4/5  Nursing note and vitals reviewed. No tenderness to palpation along the lumbar paraspinal muscles. No tenderness over the hips. Sensation is reduced right and left hands and fingers. Intact sensation at the forearms. Sensation is absent below the knee on the left side.  Ambulates with right BK prosthesis  Skin: Right BK stump well-healed, There is some soft tissue swelling around the area of the fibular head. There is some angulation and Varus deformity, Right knee  Left before meals joint prominence mild tenderness palpation. Has limitation of left shoulder abduction as well as forward flexion limited to pain  Right shoulder has pain with abduction at 90.    Assessment & Plan:  1. Chronic low back pain this is the primary reason for referral. Etiology unclear appears to be mainly axial although he does have some radiating features down the left lower extremity. His last imaging studies were about 4 years ago when living in [place]. We discussed repeating imaging at least lumbar spine 2-3 views to evaluate disc space loss as well as potential facet arthropathy. He may benefit from lumbar medial branch blocks or sacroiliac injections for the axial pain. No clear-cut signs of radiculopathy, he does have sensory loss but this is years to be neuropathy  2. 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 He has  Upper extremity as well as lower external sensory loss. Advised discontinuation of alcohol. His diabetic control appears to be good  3. Chronic narcotic use, given active ethanol abuse, would not prescribe narcotic analgesics. Have discussed alcohol treatment, he is looking for place closer to home which is in [facility]. I advised him to discuss this with his primary care physician and will send a message  4. Bilateral shoulder pain he has left before  meals joint separation his right shoulder appears to be more of an impingement-type syndrome we'll check x-rays, may need musculoskeletal ultrasound of the shoulder to evaluate soft tissue    5. Phantom limb pain right lower extremity appears to be mediated around the peroneal nerve may have a neuroma, will check x-ray of the knee may benefit from peroneal nerve block under ultrasound guidance.

## 2015-05-06 NOTE — Telephone Encounter (Signed)
Unfortunately I agreed to take over narcotic medications only until pain management has seen him. Recommend he follow-up with them regarding injections and non-narcotic medication management. We will need to wean him down off of narcotic medication if they are not taking over as I am not comfortable writing the medication chronically, especially giving alcohol abuse.

## 2015-05-07 NOTE — Telephone Encounter (Signed)
LMOM with contact name and number for return call RE: provider instructions on pain medications/SLS

## 2015-05-09 NOTE — Telephone Encounter (Signed)
Patient called back and apologized for not being able to get in touch with, stated "his phone was turned off and then he was in the bathroom at other time"; explained that that was fine, as we are in clinic during the day and cannot always get the phone calls when they come in, just make sure that the person he speaks with sends us a message.  Patient informed, understood & agreed; he will start weaning off narcotics [Oxycodone & morphine] beginning Sunday [he wanted to start on Monday, I told him that I would let him wait until Sunday]; he is to call back next week and let us know how he is doing and he is to contact Pain Mgt today and discuss getting his appointment for Injections & to start a non-narcotic medication [explained that after injections, he may find that he doesn't need the pain medication anymore], but we'll begin by weaning off of the narcotics/SLS

## 2015-05-09 NOTE — Telephone Encounter (Signed)
Pt returning call. Same #.

## 2015-05-09 NOTE — Telephone Encounter (Signed)
Whomever picked up the phone was inquiring with someone at the location as to "if someone there had called [place]?"; I then stated "this is [name] [facility] [place], is Mr. Odie SeraSheldon Gusler available?", the phone was then hung up on me.?/SLS

## 2015-05-12 ENCOUNTER — Other Ambulatory Visit: Payer: Self-pay | Admitting: Physical Medicine & Rehabilitation

## 2015-05-12 ENCOUNTER — Ambulatory Visit (HOSPITAL_BASED_OUTPATIENT_CLINIC_OR_DEPARTMENT_OTHER)
Admission: RE | Admit: 2015-05-12 | Discharge: 2015-05-12 | Disposition: A | Discharge status: Home / Self Care | Payer: Medicare Other | Source: Ambulatory Visit | Attending: Physical Medicine & Rehabilitation | Admitting: Physical Medicine & Rehabilitation

## 2015-05-12 DIAGNOSIS — M25511 Pain in right shoulder: Secondary | ICD-10-CM | POA: Insufficient documentation

## 2015-05-12 DIAGNOSIS — S42122A Displaced fracture of acromial process, left shoulder, initial encounter for closed fracture: Secondary | ICD-10-CM | POA: Insufficient documentation

## 2015-05-12 DIAGNOSIS — Z89519 Acquired absence of unspecified leg below knee: Secondary | ICD-10-CM | POA: Diagnosis not present

## 2015-05-12 DIAGNOSIS — M4316 Spondylolisthesis, lumbar region: Secondary | ICD-10-CM | POA: Insufficient documentation

## 2015-05-12 DIAGNOSIS — M25561 Pain in right knee: Principal | ICD-10-CM

## 2015-05-12 DIAGNOSIS — G8929 Other chronic pain: Secondary | ICD-10-CM | POA: Insufficient documentation

## 2015-05-12 DIAGNOSIS — M5136 Other intervertebral disc degeneration, lumbar region: Secondary | ICD-10-CM | POA: Insufficient documentation

## 2015-05-12 DIAGNOSIS — X58XXXA Exposure to other specified factors, initial encounter: Secondary | ICD-10-CM | POA: Diagnosis not present

## 2015-05-12 DIAGNOSIS — M549 Dorsalgia, unspecified: Secondary | ICD-10-CM | POA: Insufficient documentation

## 2015-05-12 DIAGNOSIS — M25512 Pain in left shoulder: Secondary | ICD-10-CM | POA: Diagnosis not present

## 2015-05-12 DIAGNOSIS — M47817 Spondylosis without myelopathy or radiculopathy, lumbosacral region: Secondary | ICD-10-CM | POA: Diagnosis not present

## 2015-05-12 DIAGNOSIS — M24812 Other specific joint derangements of left shoulder, not elsewhere classified: Secondary | ICD-10-CM

## 2015-05-12 DIAGNOSIS — M545 Low back pain: Secondary | ICD-10-CM

## 2015-05-16 ENCOUNTER — Telehealth: Payer: Self-pay | Admitting: Physician Assistant

## 2015-05-16 NOTE — Telephone Encounter (Addendum)
Relation to WU:JWJXpt:self Call back number: [phone]   Reason for call:  Patient inquiring about DG X-RAY results and would like to speak with you in regards of medication.

## 2015-05-19 ENCOUNTER — Telehealth: Payer: Self-pay | Admitting: Physical Medicine & Rehabilitation

## 2015-05-19 NOTE — Telephone Encounter (Signed)
I spoke to patient today. 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 Patient states he fell 3 or 4 months ago but did not get an x-ray. Recommend orthopedic evaluation  Patient states he quit drinking but I agree with taper of narcotic analgesics given his history.

## 2015-05-19 NOTE — Telephone Encounter (Signed)
Do you want an official discharge letter to be issued?

## 2015-05-19 NOTE — Telephone Encounter (Signed)
Patient is wanting our office to call his primary doctor Malva CoganCody Martin because they are tapering his pain medication down because he is now seeing us.  He is still in pain and wants to know what to do.  Patient is stating he is not drinking now.  Patient would also like to know about his Xrays he had done.  As I was talking to patient more, he asked me not to type what he was saying.  He did mention that he punches people when they don't do what he asks them to do and I said don't do that, he said that is just what I do.

## 2015-05-19 NOTE — Telephone Encounter (Signed)
Will continue taper off of the narcotics.

## 2015-05-19 NOTE — Telephone Encounter (Signed)
Again, did not order the x-rays so he will need to speak with Dr. Dan MakerKeirstein regarding this. Please see what his concern is regarding medications.

## 2015-05-19 NOTE — Telephone Encounter (Signed)
Called patient, informed that the ordering provider will be the one that gives him the results on his Xrays [Dr. Kirsteins] and again that he needed to call their office for this request, as patient was suppose to call on [Friday] 10.14.16 after our phone conversation to get set up for Nerve Block Injections and also to discuss pain management prescribing non-narcotic medication, as he would begin weaning off of the narcotic medication [Monday, 10.17.16]. Pt states that once a day on narcotic pain medication is not giving him any pain relief. Patient states that his appointment is on June 06, 2015 with Dr. Wynn BankerKirsteins and he was reminded of his appointment with Selena Battenody on Monday, 10.31.16. Reiterated to patient that he needs to call Dr. Wynn BankerKirsteins office [place][as told on 10.14.16, but has yet to do] and get started on non-narcotic medication while he is weaning off of the narcotic medication [which he would already be on to help with breakthrough pain, if he had called when first told to on 10.14.16]; pt understood [gave him their phone number at his request] & agreed/SLS   Regis BillSharon L Scates, CMA at 05/09/2015 2:15 PM     Status: Signed       Expand All Collapse All   Patient called back and apologized for not being able to get in touch with, stated "his phone was turned off and then he was in the bathroom at other time"; explained that that was fine, as we are in clinic during the day and cannot always get the phone calls when they come in, just make sure that the person he speaks with sends us a message.  Patient informed, understood & agreed; he will start weaning off narcotics [Oxycodone & morphine] beginning Sunday [he wanted to start on Monday, I told him that I would let him wait until Sunday]; he is to call back next week and let us know how he is doing and he is to contact Pain Mgt today and discuss getting his appointment for Injections & to start a non-narcotic medication [explained that after  injections, he may find that he doesn't need the pain medication anymore], but we'll begin by weaning off of the narcotics/SLS

## 2015-05-21 ENCOUNTER — Other Ambulatory Visit: Payer: Self-pay | Admitting: Physician Assistant

## 2015-05-23 NOTE — Telephone Encounter (Signed)
Rx request faxed to pharmacy/SLS  

## 2015-05-26 ENCOUNTER — Encounter: Payer: Self-pay | Admitting: Physician Assistant

## 2015-05-26 ENCOUNTER — Ambulatory Visit (INDEPENDENT_AMBULATORY_CARE_PROVIDER_SITE_OTHER): Payer: Medicare Other | Admitting: Physician Assistant

## 2015-05-26 ENCOUNTER — Other Ambulatory Visit: Payer: Self-pay | Admitting: Physician Assistant

## 2015-05-26 VITALS — BP 135/73 | HR 82 | Temp 98.1°F | Ht 67.0 in | Wt 214.2 lb

## 2015-05-26 DIAGNOSIS — K7030 Alcoholic cirrhosis of liver without ascites: Secondary | ICD-10-CM | POA: Diagnosis not present

## 2015-05-26 DIAGNOSIS — G8929 Other chronic pain: Secondary | ICD-10-CM | POA: Diagnosis not present

## 2015-05-26 DIAGNOSIS — M545 Low back pain: Secondary | ICD-10-CM | POA: Diagnosis not present

## 2015-05-26 MED ORDER — OXYCODONE HCL 10 MG PO TABS: 10.0000 mg | ORAL_TABLET | Freq: Three times a day (TID) | ORAL | Status: DC | PRN

## 2015-05-26 MED ORDER — MORPHINE SULFATE ER 15 MG PO TBCR: 15.0000 mg | EXTENDED_RELEASE_TABLET | Freq: Two times a day (BID) | ORAL | Status: DC

## 2015-05-26 NOTE — Progress Notes (Signed)
Patient presents to clinic today for follow-up regarding chronic pain. Is followed by Pain Management (Dr. Bertram GalaKiersteins). We have been working on tapering narcotic pain medications due to alcohol use and liver cirrhosis. Patient is having non-narcotic treatments -- 06/06/15, nerve block -- by Specialist. Is taking Morphine and Oxycodone as directed, knowing that we are slowly tapering the medications. Has cut down on drinking. Has had 1 beer since last visit. Is not going to AA and refuses to go at presently "as does not feel is needed".  Past Medical History  Diagnosis Date  . Diabetes mellitus without complication (HCC)   . Hypertension     Current Outpatient Prescriptions on File Prior to Visit  Medication Sig Dispense Refill  . ALPRAZolam (XANAX) 1 MG tablet TAKE 1 TABLET BY MOUTH AT BEDTIME AS NEEDED 30 tablet 0  . amLODipine (NORVASC) 10 MG tablet Take 1 tablet (10 mg total) by mouth daily. 30 tablet 5  . B-D ULTRAFINE III SHORT PEN 31G X 8 MM MISC USE 1 NEEDLE DAILY OR AS DIRECTED.  1  . carisoprodol (SOMA) 350 MG tablet Take 1 tablet (350 mg total) by mouth 3 (three) times daily. 90 tablet 1  . furosemide (LASIX) 40 MG tablet Take 40 mg by mouth daily.    Marland Kitchen. gabapentin (NEURONTIN) 300 MG capsule Take 2 capsules (600 mg total) by mouth 3 (three) times daily. 540 capsule 1  . Insulin Syringe-Needle U-100 (INSULIN SYRINGE 1CC/31GX5/16") 31G X 5/16" 1 ML MISC USE 1 SYRINGE D OR UTD.  0  . LANTUS SOLOSTAR 100 UNIT/ML Solostar Pen Inject 20 Units into the skin at bedtime.  0  . lisinopril (PRINIVIL,ZESTRIL) 10 MG tablet Take 10 mg by mouth daily.    . ONE TOUCH ULTRA TEST test strip 2 (two) times daily. for testing  3  . ONETOUCH DELICA LANCETS 33G MISC USE 1 LANCET BID OR AS DIRECTED.  3  . venlafaxine (EFFEXOR) 75 MG tablet Take 1 tablet (75 mg total) by mouth 2 (two) times daily. 180 tablet 1  . simvastatin (ZOCOR) 10 MG tablet Take 10 mg by mouth at bedtime.    Marland Kitchen. zolpidem (AMBIEN) 10 MG  tablet Take 10 mg by mouth at bedtime.     No current facility-administered medications on file prior to visit.    No Known Allergies  Family History  Problem Relation Age of Onset  . Diabetes Maternal Uncle   . Diabetes Maternal Grandmother     Social History   Social History  . Marital Status: Single    Spouse Name: N/A  . Number of Children: N/A  . Years of Education: N/A   Social History Main Topics  . Smoking status: Former Games developermoker  . Smokeless tobacco: None  . Alcohol Use: Yes  . Drug Use: No  . Sexual Activity: Not Asked   Other Topics Concern  . None   Social History Narrative   Review of Systems - See HPI.  All other ROS are negative.  BP 135/73 mmHg  Pulse 82  Temp(Src) 98.1 F (36.7 C) (Oral)  Ht 5\' 7"  (1.702 m)  Wt 214 lb 3.2 oz (97.16 kg)  BMI 33.54 kg/m2  SpO2 100%  Physical Exam  Constitutional: He is oriented to person, place, and time and well-developed, well-nourished, and in no distress.  HENT:  Head: Normocephalic and atraumatic.  Eyes: Conjunctivae are normal.  Neck: Neck supple.  Cardiovascular: Normal rate, regular rhythm, normal heart sounds and intact distal pulses.  Pulmonary/Chest: Effort normal and breath sounds normal. No respiratory distress. He has no wheezes. He has no rales. He exhibits no tenderness.  Neurological: He is alert and oriented to person, place, and time.  Skin: Skin is warm and dry. No rash noted.  Psychiatric: Affect normal.  Vitals reviewed.  Recent Results (from the past 2160 hour(s))  Basic Metabolic Panel (BMET)     Status: Abnormal   Collection Time: 03/25/15  2:03 PM  Result Value Ref Range   Sodium 141 135 - 145 mEq/L   Potassium 4.2 3.5 - 5.1 mEq/L   Chloride 105 96 - 112 mEq/L   CO2 24 19 - 32 mEq/L   Glucose, Bld 124 (H) 70 - 99 mg/dL   BUN 26 (H) 6 - 23 mg/dL   Creatinine, Ser 4.09 0.40 - 1.50 mg/dL   Calcium 9.8 8.4 - 81.1 mg/dL   GFR 91.47 >82.95 mL/min  Hemoglobin A1c     Status:  Abnormal   Collection Time: 03/25/15  2:03 PM  Result Value Ref Range   Hgb A1c MFr Bld 4.2 (L) 4.6 - 6.5 %    Comment: Glycemic Control Guidelines for People with Diabetes:Non Diabetic:  <6%Goal of Therapy: <7%Additional Action Suggested:  >8%    Assessment/Plan: Chronic low back pain Will increase Gabapentin to 600 mg AM, 900 mg Noon and PM. Will allow another month at same dose of pain medications until patient has his nerve blocks and injections with pain specialist. Will then resume taper.  Alcoholic cirrhosis of liver without ascites Patient has further decreased alcohol consumption to one beer every 2 weeks or so. Commend patient on efforts. Still recommend AA but patient refusing. Follow-up with GI specialist as scheduled. Will continue taper of medications.

## 2015-05-26 NOTE — Assessment & Plan Note (Signed)
Patient has further decreased alcohol consumption to one beer every 2 weeks or so. Commend patient on efforts. Still recommend AA but patient refusing. Follow-up with GI specialist as scheduled. Will continue taper of medications.

## 2015-05-26 NOTE — Assessment & Plan Note (Signed)
Will increase Gabapentin to 600 mg AM, 900 mg Noon and PM. Will allow another month at same dose of pain medications until patient has his nerve blocks and injections with pain specialist. Will then resume taper.

## 2015-05-26 NOTE — Patient Instructions (Signed)
Please continue pain medications as directed. Increase the Gabapentin to 2 tablets each morning, 3 tablets at noon and 3 tablets each evening. Follow-up with Dr. Merceda ElksKierstein as scheduled. After you have had your treatments we will taper down on medications further.  Follow-up with me in early December.

## 2015-05-26 NOTE — Progress Notes (Signed)
Pre visit review using our clinic review tool, if applicable. No additional management support is needed unless otherwise documented below in the visit note. 

## 2015-05-28 ENCOUNTER — Telehealth: Payer: Self-pay

## 2015-05-28 NOTE — Telephone Encounter (Signed)
Left msg for patient to call back to schedule AWV

## 2015-06-04 ENCOUNTER — Telehealth: Payer: Self-pay | Admitting: Physician Assistant

## 2015-06-04 NOTE — Telephone Encounter (Signed)
Relation to ZO:XWRUpt:self Call back number:[phone]   Reason for call:  Patient wanted to inform PA he receved "Hep C" medication and wanted to know when you wanted him to start medication

## 2015-06-05 NOTE — Telephone Encounter (Signed)
Pt notified and made aware.  He stated understanding and said that he would call Dr. Marcelene ButteHurrelbrink. No further actions required at this time.

## 2015-06-05 NOTE — Telephone Encounter (Signed)
I did not prescribe the medication. His GI doctor, Dr. Marcelene Buttehurrelbrink would have been the one to prescribe. The patient should take as directed.

## 2015-06-06 ENCOUNTER — Encounter: Payer: Medicare Other | Attending: Physical Medicine & Rehabilitation

## 2015-06-06 ENCOUNTER — Encounter: Payer: Self-pay | Admitting: Physical Medicine & Rehabilitation

## 2015-06-06 ENCOUNTER — Ambulatory Visit (HOSPITAL_BASED_OUTPATIENT_CLINIC_OR_DEPARTMENT_OTHER): Payer: Medicare Other | Admitting: Physical Medicine & Rehabilitation

## 2015-06-06 VITALS — BP 115/69 | HR 96 | Resp 14

## 2015-06-06 DIAGNOSIS — Z87891 Personal history of nicotine dependence: Secondary | ICD-10-CM | POA: Diagnosis not present

## 2015-06-06 DIAGNOSIS — Z79891 Long term (current) use of opiate analgesic: Secondary | ICD-10-CM | POA: Diagnosis not present

## 2015-06-06 DIAGNOSIS — F1010 Alcohol abuse, uncomplicated: Secondary | ICD-10-CM | POA: Insufficient documentation

## 2015-06-06 DIAGNOSIS — M25511 Pain in right shoulder: Secondary | ICD-10-CM | POA: Diagnosis not present

## 2015-06-06 DIAGNOSIS — M24812 Other specific joint derangements of left shoulder, not elsewhere classified: Secondary | ICD-10-CM | POA: Diagnosis not present

## 2015-06-06 DIAGNOSIS — G8929 Other chronic pain: Secondary | ICD-10-CM | POA: Insufficient documentation

## 2015-06-06 DIAGNOSIS — M25561 Pain in right knee: Secondary | ICD-10-CM | POA: Diagnosis not present

## 2015-06-06 DIAGNOSIS — G546 Phantom limb syndrome with pain: Secondary | ICD-10-CM | POA: Diagnosis not present

## 2015-06-06 DIAGNOSIS — M545 Low back pain: Secondary | ICD-10-CM | POA: Insufficient documentation

## 2015-06-06 DIAGNOSIS — E119 Type 2 diabetes mellitus without complications: Secondary | ICD-10-CM | POA: Insufficient documentation

## 2015-06-06 DIAGNOSIS — G629 Polyneuropathy, unspecified: Secondary | ICD-10-CM | POA: Insufficient documentation

## 2015-06-06 DIAGNOSIS — Z89511 Acquired absence of right leg below knee: Secondary | ICD-10-CM | POA: Diagnosis not present

## 2015-06-06 DIAGNOSIS — M25512 Pain in left shoulder: Secondary | ICD-10-CM | POA: Insufficient documentation

## 2015-06-06 DIAGNOSIS — I10 Essential (primary) hypertension: Secondary | ICD-10-CM | POA: Insufficient documentation

## 2015-06-06 NOTE — Patient Instructions (Signed)
Medication management by your primary care physician Next visit will be for spine injection We will discuss the effect of this nerve block next visit.

## 2015-06-06 NOTE — Progress Notes (Signed)
Right peroneal nerve block under ultrasound guidance. Indication phantom limb pain status post right below-knee amputation  Informed consent was obtained after discussing risks and benefits of the procedure with patient these include bleeding bruising and infection he likes to proceed and has given written consent Patient placed in the left lateral decubitus position the fibular head was identified with palpation and the area posterior to the fibular head was scanned with  Linear transducer 12 Hz. Peroneal nerve was identified in transverse section posterior to the fibular head.  After Betadine prep and sterile drape and under  Sterile conditions using sterile gel and sterile probe cover a 25-gauge 1.5 inch needle was inserted under direct ultrasound visualization needle was placed first anterior to the nerve and then posterior to the nerve infiltrating 1.5 mL of 0.25% Marcaine into the nerve and 2.5 mL of the Marcaine posterior to the nerve. Patient tolerated procedure well postprocedure instructions given

## 2015-06-07 DIAGNOSIS — L089 Local infection of the skin and subcutaneous tissue, unspecified: Secondary | ICD-10-CM | POA: Diagnosis not present

## 2015-06-07 DIAGNOSIS — S61219A Laceration without foreign body of unspecified finger without damage to nail, initial encounter: Secondary | ICD-10-CM | POA: Diagnosis not present

## 2015-06-07 DIAGNOSIS — Z794 Long term (current) use of insulin: Secondary | ICD-10-CM | POA: Diagnosis not present

## 2015-06-07 DIAGNOSIS — Z79899 Other long term (current) drug therapy: Secondary | ICD-10-CM | POA: Diagnosis not present

## 2015-06-07 DIAGNOSIS — G4700 Insomnia, unspecified: Secondary | ICD-10-CM | POA: Diagnosis not present

## 2015-06-07 DIAGNOSIS — B182 Chronic viral hepatitis C: Secondary | ICD-10-CM | POA: Diagnosis not present

## 2015-06-07 DIAGNOSIS — F419 Anxiety disorder, unspecified: Secondary | ICD-10-CM | POA: Diagnosis not present

## 2015-06-07 DIAGNOSIS — E1140 Type 2 diabetes mellitus with diabetic neuropathy, unspecified: Secondary | ICD-10-CM | POA: Diagnosis not present

## 2015-06-07 DIAGNOSIS — G8929 Other chronic pain: Secondary | ICD-10-CM | POA: Diagnosis not present

## 2015-06-07 DIAGNOSIS — Z89511 Acquired absence of right leg below knee: Secondary | ICD-10-CM | POA: Diagnosis not present

## 2015-06-07 DIAGNOSIS — I10 Essential (primary) hypertension: Secondary | ICD-10-CM | POA: Diagnosis not present

## 2015-06-07 DIAGNOSIS — S61211A Laceration without foreign body of left index finger without damage to nail, initial encounter: Secondary | ICD-10-CM | POA: Diagnosis not present

## 2015-06-09 ENCOUNTER — Telehealth: Payer: Self-pay | Admitting: Physician Assistant

## 2015-06-09 NOTE — Telephone Encounter (Signed)
Recommend he elevate extremity. Get an ACE wrap and apply to the extremity to help with swelling so the prosthetic will fit.

## 2015-06-09 NOTE — Telephone Encounter (Signed)
Caller name: Self   Can be reached: [phone]   Reason for call: FYI: Patient complaining of not being able to wear his prosthetic leg because of swelling. Called [facility] they can not see him until the end of the month. Plse Adv

## 2015-06-09 NOTE — Telephone Encounter (Signed)
Spoke with pt and he voices understanding. Pt is willing to try the ACE wrap to help with the swelling.

## 2015-06-17 ENCOUNTER — Other Ambulatory Visit: Payer: Self-pay | Admitting: Physician Assistant

## 2015-06-26 DIAGNOSIS — B182 Chronic viral hepatitis C: Secondary | ICD-10-CM | POA: Diagnosis not present

## 2015-06-26 DIAGNOSIS — Z79899 Other long term (current) drug therapy: Secondary | ICD-10-CM | POA: Diagnosis not present

## 2015-06-27 ENCOUNTER — Ambulatory Visit (INDEPENDENT_AMBULATORY_CARE_PROVIDER_SITE_OTHER): Payer: Medicare Other | Admitting: Physician Assistant

## 2015-06-27 ENCOUNTER — Encounter: Payer: Self-pay | Admitting: Physician Assistant

## 2015-06-27 ENCOUNTER — Encounter: Payer: Self-pay | Admitting: *Deleted

## 2015-06-27 VITALS — BP 124/83 | HR 97 | Temp 98.2°F | Resp 16 | Ht 67.0 in | Wt 208.5 lb

## 2015-06-27 DIAGNOSIS — E669 Obesity, unspecified: Secondary | ICD-10-CM

## 2015-06-27 DIAGNOSIS — Z794 Long term (current) use of insulin: Secondary | ICD-10-CM

## 2015-06-27 DIAGNOSIS — Z125 Encounter for screening for malignant neoplasm of prostate: Secondary | ICD-10-CM | POA: Diagnosis not present

## 2015-06-27 DIAGNOSIS — Z0000 Encounter for general adult medical examination without abnormal findings: Secondary | ICD-10-CM

## 2015-06-27 DIAGNOSIS — E1140 Type 2 diabetes mellitus with diabetic neuropathy, unspecified: Secondary | ICD-10-CM

## 2015-06-27 DIAGNOSIS — I10 Essential (primary) hypertension: Secondary | ICD-10-CM

## 2015-06-27 DIAGNOSIS — B182 Chronic viral hepatitis C: Secondary | ICD-10-CM | POA: Diagnosis not present

## 2015-06-27 DIAGNOSIS — Z136 Encounter for screening for cardiovascular disorders: Secondary | ICD-10-CM

## 2015-06-27 DIAGNOSIS — E785 Hyperlipidemia, unspecified: Secondary | ICD-10-CM | POA: Diagnosis not present

## 2015-06-27 DIAGNOSIS — Z89511 Acquired absence of right leg below knee: Secondary | ICD-10-CM

## 2015-06-27 LAB — COMPREHENSIVE METABOLIC PANEL
ALBUMIN: 3.9 g/dL (ref 3.5–5.2)
ALT: 52 U/L (ref 0–53)
AST: 55 U/L — ABNORMAL HIGH (ref 0–37)
Alkaline Phosphatase: 79 U/L (ref 39–117)
BILIRUBIN TOTAL: 1.2 mg/dL (ref 0.2–1.2)
BUN: 26 mg/dL — ABNORMAL HIGH (ref 6–23)
CO2: 24 mEq/L (ref 19–32)
Calcium: 9.7 mg/dL (ref 8.4–10.5)
Chloride: 100 mEq/L (ref 96–112)
Creatinine, Ser: 1.48 mg/dL (ref 0.40–1.50)
GFR: 63.86 mL/min (ref >60.00)
GLUCOSE: 148 mg/dL — AB (ref 70–99)
POTASSIUM: 3.8 meq/L (ref 3.5–5.1)
Sodium: 136 mEq/L (ref 135–145)
TOTAL PROTEIN: 8.9 g/dL — AB (ref 6.0–8.3)

## 2015-06-27 LAB — LIPID PANEL
CHOLESTEROL: 165 mg/dL (ref 0–200)
HDL: 40 mg/dL (ref >39.00)
LDL CALC: 108 mg/dL — AB (ref 0–99)
NonHDL: 124.58
TRIGLYCERIDES: 84 mg/dL (ref 0.0–149.0)
Total CHOL/HDL Ratio: 4
VLDL: 16.8 mg/dL (ref 0.0–40.0)

## 2015-06-27 LAB — TSH: TSH: 1.38 u[IU]/mL (ref 0.35–4.50)

## 2015-06-27 LAB — PSA, MEDICARE: PSA: 2.88 ng/mL (ref 0.10–4.00)

## 2015-06-27 LAB — HEMOGLOBIN A1C: Hgb A1c MFr Bld: 4.3 % — ABNORMAL LOW (ref 4.6–6.5)

## 2015-06-27 MED ORDER — MORPHINE SULFATE ER 15 MG PO TBCR: 15.0000 mg | EXTENDED_RELEASE_TABLET | Freq: Two times a day (BID) | ORAL | Status: DC

## 2015-06-27 MED ORDER — OXYCODONE HCL 10 MG PO TABS: 10.0000 mg | ORAL_TABLET | Freq: Two times a day (BID) | ORAL | Status: DC | PRN

## 2015-06-27 MED ORDER — ALPRAZOLAM 1 MG PO TABS: 1.0000 mg | ORAL_TABLET | Freq: Every evening | ORAL | Status: DC | PRN

## 2015-06-27 MED ORDER — CARISOPRODOL 350 MG PO TABS: 350.0000 mg | ORAL_TABLET | Freq: Three times a day (TID) | ORAL | Status: DC

## 2015-06-27 NOTE — Patient Instructions (Signed)
Please continue medications as directed. Follow-up with all of your specialists as directed. I will be sending in a prescription for a new prosthetic. You will be contacted by [employer] for fitting.  Please go to the lab for blood work. I will call you with your results.  Preventive Care for Adults, Male A healthy lifestyle and preventive care can promote health and wellness. Preventive health guidelines for men include the following key practices:  A routine yearly physical is a good way to check with your health care provider about your health and preventative screening. It is a chance to share any concerns and updates on your health and to receive a thorough exam.  Visit your dentist for a routine exam and preventative care every 6 months. Brush your teeth twice a day and floss once a day. Good oral hygiene prevents tooth decay and gum disease.  The frequency of eye exams is based on your age, health, family medical history, use of contact lenses, and other factors. Follow your health care provider's recommendations for frequency of eye exams.  Eat a healthy diet. Foods such as vegetables, fruits, whole grains, low-fat dairy products, and lean protein foods contain the nutrients you need without too many calories. Decrease your intake of foods high in solid fats, added sugars, and salt. Eat the right amount of calories for you.Get information about a proper diet from your health care provider, if necessary.  Regular physical exercise is one of the most important things you can do for your health. Most adults should get at least 150 minutes of moderate-intensity exercise (any activity that increases your heart rate and causes you to sweat) each week. In addition, most adults need muscle-strengthening exercises on 2 or more days a week.  Maintain a healthy weight. The body mass index (BMI) is a screening tool to identify possible weight problems. It provides an estimate of body fat based on  height and weight. Your health care provider can find your BMI and can help you achieve or maintain a healthy weight.For adults 20 years and older:  A BMI below 18.5 is considered underweight.  A BMI of 18.5 to 24.9 is normal.  A BMI of 25 to 29.9 is considered overweight.  A BMI of 30 and above is considered obese.  Maintain normal blood lipids and cholesterol levels by exercising and minimizing your intake of saturated fat. Eat a balanced diet with plenty of fruit and vegetables. Blood tests for lipids and cholesterol should begin at age 23 and be repeated every 5 years. If your lipid or cholesterol levels are high, you are over 50, or you are at high risk for heart disease, you may need your cholesterol levels checked more frequently.Ongoing high lipid and cholesterol levels should be treated with medicines if diet and exercise are not working.  If you smoke, find out from your health care provider how to quit. If you do not use tobacco, do not start.  Lung cancer screening is recommended for adults aged 76-80 years who are at high risk for developing lung cancer because of a history of smoking. A yearly low-dose CT scan of the lungs is recommended for people who have at least a 30-pack-year history of smoking and are a current smoker or have quit within the past 15 years. A pack year of smoking is smoking an average of 1 pack of cigarettes a day for 1 year (for example: 1 pack a day for 30 years or 2 packs a day  for 15 years). Yearly screening should continue until the smoker has stopped smoking for at least 15 years. Yearly screening should be stopped for people who develop a health problem that would prevent them from having lung cancer treatment.  If you choose to drink alcohol, do not have more than 2 drinks per day. One drink is considered to be 12 ounces (355 mL) of beer, 5 ounces (148 mL) of wine, or 1.5 ounces (44 mL) of liquor.  Avoid use of street drugs. Do not share needles with  anyone. Ask for help if you need support or instructions about stopping the use of drugs.  High blood pressure causes heart disease and increases the risk of stroke. Your blood pressure should be checked at least every 1-2 years. Ongoing high blood pressure should be treated with medicines, if weight loss and exercise are not effective.  If you are 63-59 years old, ask your health care provider if you should take aspirin to prevent heart disease.  Diabetes screening is done by taking a blood sample to check your blood glucose level after you have not eaten for a certain period of time (fasting). If you are not overweight and you do not have risk factors for diabetes, you should be screened once every 3 years starting at age 72. If you are overweight or obese and you are 42-37 years of age, you should be screened for diabetes every year as part of your cardiovascular risk assessment.  Colorectal cancer can be detected and often prevented. Most routine colorectal cancer screening begins at the age of 88 and continues through age 23. However, your health care provider may recommend screening at an earlier age if you have risk factors for colon cancer. On a yearly basis, your health care provider may provide home test kits to check for hidden blood in the stool. Use of a small camera at the end of a tube to directly examine the colon (sigmoidoscopy or colonoscopy) can detect the earliest forms of colorectal cancer. Talk to your health care provider about this at age 55, when routine screening begins. Direct exam of the colon should be repeated every 5-10 years through age 11, unless early forms of precancerous polyps or small growths are found.  People who are at an increased risk for hepatitis B should be screened for this virus. You are considered at high risk for hepatitis B if:  You were born in a country where hepatitis B occurs often. Talk with your health care provider about which countries are  considered high risk.  Your parents were born in a high-risk country and you have not received a shot to protect against hepatitis B (hepatitis B vaccine).  You have HIV or AIDS.  You use needles to inject street drugs.  You live with, or have sex with, someone who has hepatitis B.  You are a man who has sex with other men (MSM).  You get hemodialysis treatment.  You take certain medicines for conditions such as cancer, organ transplantation, and autoimmune conditions.  Hepatitis C blood testing is recommended for all people born from [birth year] through 1965 and any individual with known risks for hepatitis C.  Practice safe sex. Use condoms and avoid high-risk sexual practices to reduce the spread of sexually transmitted infections (STIs). STIs include gonorrhea, chlamydia, syphilis, trichomonas, herpes, HPV, and human immunodeficiency virus (HIV). Herpes, HIV, and HPV are viral illnesses that have no cure. They can result in disability, cancer, and death.  If you  are a man who has sex with other men, you should be screened at least once per year for:  HIV.  Urethral, rectal, and pharyngeal infection of gonorrhea, chlamydia, or both.  If you are at risk of being infected with HIV, it is recommended that you take a prescription medicine daily to prevent HIV infection. This is called preexposure prophylaxis (PrEP). You are considered at risk if:  You are a man who has sex with other men (MSM) and have other risk factors.  You are a heterosexual man, are sexually active, and are at increased risk for HIV infection.  You take drugs by injection.  You are sexually active with a partner who has HIV.  Talk with your health care provider about whether you are at high risk of being infected with HIV. If you choose to begin PrEP, you should first be tested for HIV. You should then be tested every 3 months for as long as you are taking PrEP.  A one-time screening for abdominal aortic aneurysm  (AAA) and surgical repair of large AAAs by ultrasound are recommended for men ages 71 to 8 years who are current or former smokers.  Healthy men should no longer receive prostate-specific antigen (PSA) blood tests as part of routine cancer screening. Talk with your health care provider about prostate cancer screening.  Testicular cancer screening is not recommended for adult males who have no symptoms. Screening includes self-exam, a health care provider exam, and other screening tests. Consult with your health care provider about any symptoms you have or any concerns you have about testicular cancer.  Use sunscreen. Apply sunscreen liberally and repeatedly throughout the day. You should seek shade when your shadow is shorter than you. Protect yourself by wearing long sleeves, pants, a wide-brimmed hat, and sunglasses year round, whenever you are outdoors.  Once a month, do a whole-body skin exam, using a mirror to look at the skin on your back. Tell your health care provider about new moles, moles that have irregular borders, moles that are larger than a pencil eraser, or moles that have changed in shape or color.  Stay current with required vaccines (immunizations).  Influenza vaccine. All adults should be immunized every year.  Tetanus, diphtheria, and acellular pertussis (Td, Tdap) vaccine. An adult who has not previously received Tdap or who does not know his vaccine status should receive 1 dose of Tdap. This initial dose should be followed by tetanus and diphtheria toxoids (Td) booster doses every 10 years. Adults with an unknown or incomplete history of completing a 3-dose immunization series with Td-containing vaccines should begin or complete a primary immunization series including a Tdap dose. Adults should receive a Td booster every 10 years.  Varicella vaccine. An adult without evidence of immunity to varicella should receive 2 doses or a second dose if he has previously received 1  dose.  Human papillomavirus (HPV) vaccine. Males aged 11-21 years who have not received the vaccine previously should receive the 3-dose series. Males aged 22-26 years may be immunized. Immunization is recommended through the age of 81 years for any male who has sex with males and did not get any or all doses earlier. Immunization is recommended for any person with an immunocompromised condition through the age of 34 years if he did not get any or all doses earlier. During the 3-dose series, the second dose should be obtained 4-8 weeks after the first dose. The third dose should be obtained 24 weeks after the first  dose and 16 weeks after the second dose.  Zoster vaccine. One dose is recommended for adults aged 87 years or older unless certain conditions are present.  Measles, mumps, and rubella (MMR) vaccine. Adults born before [birth year] generally are considered immune to measles and mumps. Adults born in [birth year] or later should have 1 or more doses of MMR vaccine unless there is a contraindication to the vaccine or there is laboratory evidence of immunity to each of the three diseases. A routine second dose of MMR vaccine should be obtained at least 28 days after the first dose for students attending postsecondary schools, health care workers, or international travelers. People who received inactivated measles vaccine or an unknown type of measles vaccine during 1963-1967 should receive 2 doses of MMR vaccine. People who received inactivated mumps vaccine or an unknown type of mumps vaccine before 1979 and are at high risk for mumps infection should consider immunization with 2 doses of MMR vaccine. Unvaccinated health care workers born before [birth year] who lack laboratory evidence of measles, mumps, or rubella immunity or laboratory confirmation of disease should consider measles and mumps immunization with 2 doses of MMR vaccine or rubella immunization with 1 dose of MMR vaccine.  Pneumococcal 13-valent conjugate  (PCV13) vaccine. When indicated, a person who is uncertain of his immunization history and has no record of immunization should receive the PCV13 vaccine. All adults 47 years of age and older should receive this vaccine. An adult aged 95 years or older who has certain medical conditions and has not been previously immunized should receive 1 dose of PCV13 vaccine. This PCV13 should be followed with a dose of pneumococcal polysaccharide (PPSV23) vaccine. Adults who are at high risk for pneumococcal disease should obtain the PPSV23 vaccine at least 8 weeks after the dose of PCV13 vaccine. Adults older than 53 years of age who have normal immune system function should obtain the PPSV23 vaccine dose at least 1 year after the dose of PCV13 vaccine.  Pneumococcal polysaccharide (PPSV23) vaccine. When PCV13 is also indicated, PCV13 should be obtained first. All adults aged 28 years and older should be immunized. An adult younger than age 46 years who has certain medical conditions should be immunized. Any person who resides in a nursing home or long-term care facility should be immunized. An adult smoker should be immunized. People with an immunocompromised condition and certain other conditions should receive both PCV13 and PPSV23 vaccines. People with human immunodeficiency virus (HIV) infection should be immunized as soon as possible after diagnosis. Immunization during chemotherapy or radiation therapy should be avoided. Routine use of PPSV23 vaccine is not recommended for American Indians, [place] Natives, or people younger than 65 years unless there are medical conditions that require PPSV23 vaccine. When indicated, people who have unknown immunization and have no record of immunization should receive PPSV23 vaccine. One-time revaccination 5 years after the first dose of PPSV23 is recommended for people aged 19-64 years who have chronic kidney failure, nephrotic syndrome, asplenia, or immunocompromised conditions.  People who received 1-2 doses of PPSV23 before age 2 years should receive another dose of PPSV23 vaccine at age 40 years or later if at least 5 years have passed since the previous dose. Doses of PPSV23 are not needed for people immunized with PPSV23 at or after age 7 years.  Meningococcal vaccine. Adults with asplenia or persistent complement component deficiencies should receive 2 doses of quadrivalent meningococcal conjugate (MenACWY-D) vaccine. The doses should be obtained at least 2 months apart. Microbiologists  working with certain meningococcal bacteria, [employer] recruits, people at risk during an outbreak, and people who travel to or live in countries with a high rate of meningitis should be immunized. A first-year college student up through age 61 years who is living in a residence hall should receive a dose if he did not receive a dose on or after his 16th birthday. Adults who have certain high-risk conditions should receive one or more doses of vaccine.  Hepatitis A vaccine. Adults who wish to be protected from this disease, have chronic liver disease, work with hepatitis A-infected animals, work in hepatitis A research labs, or travel to or work in countries with a high rate of hepatitis A should be immunized. Adults who were previously unvaccinated and who anticipate close contact with an international adoptee during the first 60 days after arrival in the [place] from a country with a high rate of hepatitis A should be immunized.  Hepatitis B vaccine. Adults should be immunized if they wish to be protected from this disease, are under age 76 years and have diabetes, have chronic liver disease, have had more than one sex partner in the past 6 months, may be exposed to blood or other infectious body fluids, are household contacts or sex partners of hepatitis B positive people, are clients or workers in certain care facilities, or travel to or work in countries with a high rate of hepatitis  B.  Haemophilus influenzae type b (Hib) vaccine. A previously unvaccinated person with asplenia or sickle cell disease or having a scheduled splenectomy should receive 1 dose of Hib vaccine. Regardless of previous immunization, a recipient of a hematopoietic stem cell transplant should receive a 3-dose series 6-12 months after his successful transplant. Hib vaccine is not recommended for adults with HIV infection. Preventive Service / Frequency Ages 51 to 50  Blood pressure check.** / Every 3-5 years.  Lipid and cholesterol check.** / Every 5 years beginning at age 27.  Hepatitis C blood test.** / For any individual with known risks for hepatitis C.  Skin self-exam. / Monthly.  Influenza vaccine. / Every year.  Tetanus, diphtheria, and acellular pertussis (Tdap, Td) vaccine.** / Consult your health care provider. 1 dose of Td every 10 years.  Varicella vaccine.** / Consult your health care provider.  HPV vaccine. / 3 doses over 6 months, if 38 or younger.  Measles, mumps, rubella (MMR) vaccine.** / You need at least 1 dose of MMR if you were born in 1957 or later. You may also need a second dose.  Pneumococcal 13-valent conjugate (PCV13) vaccine.** / Consult your health care provider.  Pneumococcal polysaccharide (PPSV23) vaccine.** / 1 to 2 doses if you smoke cigarettes or if you have certain conditions.  Meningococcal vaccine.** / 1 dose if you are age 43 to 80 years and a [employer] researcher living in a residence hall, or have one of several medical conditions. You may also need additional booster doses.  Hepatitis A vaccine.** / Consult your health care provider.  Hepatitis B vaccine.** / Consult your health care provider.  Haemophilus influenzae type b (Hib) vaccine.** / Consult your health care provider. Ages 60 to 67  Blood pressure check.** / Every year.  Lipid and cholesterol check.** / Every 5 years beginning at age 33.  Lung cancer screening. / Every year if  you are aged 67-80 years and have a 30-pack-year history of smoking and currently smoke or have quit within the past 15 years. Yearly screening is stopped  once you have quit smoking for at least 15 years or develop a health problem that would prevent you from having lung cancer treatment.  Fecal occult blood test (FOBT) of stool. / Every year beginning at age 66 and continuing until age 37. You may not have to do this test if you get a colonoscopy every 10 years.  Flexible sigmoidoscopy** or colonoscopy.** / Every 5 years for a flexible sigmoidoscopy or every 10 years for a colonoscopy beginning at age 78 and continuing until age 67.  Hepatitis C blood test.** / For all people born from [birth year] through 1965 and any individual with known risks for hepatitis C.  Skin self-exam. / Monthly.  Influenza vaccine. / Every year.  Tetanus, diphtheria, and acellular pertussis (Tdap/Td) vaccine.** / Consult your health care provider. 1 dose of Td every 10 years.  Varicella vaccine.** / Consult your health care provider.  Zoster vaccine.** / 1 dose for adults aged 21 years or older.  Measles, mumps, rubella (MMR) vaccine.** / You need at least 1 dose of MMR if you were born in 1957 or later. You may also need a second dose.  Pneumococcal 13-valent conjugate (PCV13) vaccine.** / Consult your health care provider.  Pneumococcal polysaccharide (PPSV23) vaccine.** / 1 to 2 doses if you smoke cigarettes or if you have certain conditions.  Meningococcal vaccine.** / Consult your health care provider.  Hepatitis A vaccine.** / Consult your health care provider.  Hepatitis B vaccine.** / Consult your health care provider.  Haemophilus influenzae type b (Hib) vaccine.** / Consult your health care provider. Ages 66 and over  Blood pressure check.** / Every year.  Lipid and cholesterol check.**/ Every 5 years beginning at age 68.  Lung cancer screening. / Every year if you are aged 61-80 years and have a  30-pack-year history of smoking and currently smoke or have quit within the past 15 years. Yearly screening is stopped once you have quit smoking for at least 15 years or develop a health problem that would prevent you from having lung cancer treatment.  Fecal occult blood test (FOBT) of stool. / Every year beginning at age 64 and continuing until age 49. You may not have to do this test if you get a colonoscopy every 10 years.  Flexible sigmoidoscopy** or colonoscopy.** / Every 5 years for a flexible sigmoidoscopy or every 10 years for a colonoscopy beginning at age 54 and continuing until age 30.  Hepatitis C blood test.** / For all people born from [birth year] through 1964 and any individual with known risks for hepatitis C.  Abdominal aortic aneurysm (AAA) screening.** / A one-time screening for ages 39 to 4 years who are current or former smokers.  Skin self-exam. / Monthly.  Influenza vaccine. / Every year.  Tetanus, diphtheria, and acellular pertussis (Tdap/Td) vaccine.** / 1 dose of Td every 10 years.  Varicella vaccine.** / Consult your health care provider.  Zoster vaccine.** / 1 dose for adults aged 98 years or older.  Pneumococcal 13-valent conjugate (PCV13) vaccine.** / 1 dose for all adults aged 91 years and older.  Pneumococcal polysaccharide (PPSV23) vaccine.** / 1 dose for all adults aged 12 years and older.  Meningococcal vaccine.** / Consult your health care provider.  Hepatitis A vaccine.** / Consult your health care provider.  Hepatitis B vaccine.** / Consult your health care provider.  Haemophilus influenzae type b (Hib) vaccine.** / Consult your health care provider. **Family history and personal history of risk and conditions may change your health  care provider's recommendations.   This information is not intended to replace advice given to you by your health care provider. Make sure you discuss any questions you have with your health care provider.   Document  Released: 09/07/2001 Document Revised: 08/02/2014 Document Reviewed: 12/07/2010 [employer]vier Interactive Patient Education [employer].

## 2015-06-27 NOTE — Progress Notes (Signed)
Pre visit review using our clinic review tool, if applicable. No additional management support is needed unless otherwise documented below in the visit note/SLS  

## 2015-06-27 NOTE — Progress Notes (Signed)
Subjective:    Justin Stone is a 54 y.o. male who presents for Medicare Annual/Subsequent preventive examination.   Preventive Screening-Counseling & Management  Tobacco History  Smoking status  . Former Smoker  Smokeless tobacco  . Not on file    Problems Prior to Visit 1. S/P BKA R lower extremity -- patient needing new Rx for prosthesis as old prosthetic is no longer fitting correctly. Ambulation is impaired without prosthesis. Patient has not been wearing old prosthetic due to poor fit causing increased risk of falls. Patient with chronic pain (phantom limb pain), recently received nerve block from pain management.  2. Chronic Hepatitis C/Alcoholic Cirrhosis of Liver -- patient endorses he has weaned completely off of alcohol. Is following with Hepatology with current treatment for Hepatitis C. Patient is being weaned off of pain medications due to liver insult.   Current Problems (verified) Patient Active Problem List   Diagnosis Date Noted  . 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 Diabetes mellitus type II, controlled (HCC) 03/26/2015  . Need for prophylactic vaccination against Streptococcus pneumoniae (pneumococcus) 03/26/2015  . S/P BKA (below knee amputation) (HCC) 03/26/2015  . Mallory-Weiss tear 02/22/2015  . Alcoholic cirrhosis of liver without ascites (HCC) 02/22/2015  . Chronic hepatitis C without hepatic coma (HCC) 02/22/2015  . Chronic low back pain 12/29/2014  . Essential hypertension, benign 12/29/2014  . Hyperlipidemia LDL goal <100 12/29/2014    Medications Prior to Visit Current Outpatient Prescriptions on File Prior to Visit  Medication Sig Dispense Refill  . ALPRAZolam (XANAX) 1 MG tablet TAKE 1 TABLET BY MOUTH AT BEDTIME AS NEEDED 30 tablet 0  . amLODipine (NORVASC) 10 MG tablet Take 1 tablet (10 mg total) by mouth daily. 30 tablet 5  . B-D ULTRAFINE III SHORT PEN 31G X 8 MM MISC USE 1 NEEDLE DAILY OR AS DIRECTED.  1   . carisoprodol (SOMA) 350 MG tablet Take 1 tablet (350 mg total) by mouth 3 (three) times daily. 90 tablet 1  . furosemide (LASIX) 40 MG tablet Take 40 mg by mouth daily.    Marland Kitchen gabapentin (NEURONTIN) 300 MG capsule Take 2 capsules (600 mg total) by mouth 3 (three) times daily. 540 capsule 1  . Insulin Syringe-Needle U-100 (INSULIN SYRINGE 1CC/31GX5/16") 31G X 5/16" 1 ML MISC USE 1 SYRINGE D OR UTD.  0  . LANTUS SOLOSTAR 100 UNIT/ML Solostar Pen Inject 20 Units into the skin at bedtime.  0  . lisinopril (PRINIVIL,ZESTRIL) 10 MG tablet Take 10 mg by mouth daily.    Marland Kitchen morphine (MS CONTIN) 15 MG 12 hr tablet Take 1 tablet (15 mg total) by mouth every 12 (twelve) hours. 60 tablet 0  . ONE TOUCH ULTRA TEST test strip 2 (two) times daily. for testing  3  . ONETOUCH DELICA LANCETS 33G MISC USE 1 LANCET BID OR AS DIRECTED.  3  . Oxycodone HCl 10 MG TABS Take 1 tablet (10 mg total) by mouth 3 (three) times daily as needed. 90 tablet 0  . simvastatin (ZOCOR) 10 MG tablet Take 10 mg by mouth at bedtime.    Marland Kitchen venlafaxine (EFFEXOR) 75 MG tablet Take 1 tablet (75 mg total) by mouth 2 (two) times daily. 180 tablet 1   No current facility-administered medications on file prior to visit.    Current Medications (verified) Current Outpatient Prescriptions  Medication Sig Dispense Refill  . ALPRAZolam (XANAX) 1 MG tablet TAKE 1 TABLET BY MOUTH AT BEDTIME AS NEEDED 30  tablet 0  . amLODipine (NORVASC) 10 MG tablet Take 1 tablet (10 mg total) by mouth daily. 30 tablet 5  . B-D ULTRAFINE III SHORT PEN 31G X 8 MM MISC USE 1 NEEDLE DAILY OR AS DIRECTED.  1  . carisoprodol (SOMA) 350 MG tablet Take 1 tablet (350 mg total) by mouth 3 (three) times daily. 90 tablet 1  . furosemide (LASIX) 40 MG tablet Take 40 mg by mouth daily.    Marland Kitchen gabapentin (NEURONTIN) 300 MG capsule Take 2 capsules (600 mg total) by mouth 3 (three) times daily. 540 capsule 1  . Insulin Syringe-Needle U-100 (INSULIN SYRINGE 1CC/31GX5/16") 31G X 5/16"  1 ML MISC USE 1 SYRINGE D OR UTD.  0  . LANTUS SOLOSTAR 100 UNIT/ML Solostar Pen Inject 20 Units into the skin at bedtime.  0  . lisinopril (PRINIVIL,ZESTRIL) 10 MG tablet Take 10 mg by mouth daily.    Marland Kitchen morphine (MS CONTIN) 15 MG 12 hr tablet Take 1 tablet (15 mg total) by mouth every 12 (twelve) hours. 60 tablet 0  . ONE TOUCH ULTRA TEST test strip 2 (two) times daily. for testing  3  . ONETOUCH DELICA LANCETS 33G MISC USE 1 LANCET BID OR AS DIRECTED.  3  . Oxycodone HCl 10 MG TABS Take 1 tablet (10 mg total) by mouth 3 (three) times daily as needed. 90 tablet 0  . pyridOXINE (VITAMIN B-6) 25 MG tablet Take 25 mg by mouth daily.    . simvastatin (ZOCOR) 10 MG tablet Take 10 mg by mouth at bedtime.    Marland Kitchen venlafaxine (EFFEXOR) 75 MG tablet Take 1 tablet (75 mg total) by mouth 2 (two) times daily. 180 tablet 1   No current facility-administered medications for this visit.    Allergies (verified) Review of patient's allergies indicates no known allergies.   PAST HISTORY  Family History Family History  Problem Relation Age of Onset  . Diabetes Maternal Uncle   . Diabetes Maternal Grandmother    Social History Social History  Substance Use Topics  . Smoking status: Former Games developer  . Smokeless tobacco: Not on file  . Alcohol Use: Yes   Are there smokers in your home (other than you)?  No  Risk Factors Current exercise habits: Exercise is limited by BKA.  Dietary issues discussed: Well-balanced diet. Body mass index is 32.65 kg/(m^2).  Cardiac risk factors: diabetes mellitus, dyslipidemia, hypertension, male gender, obesity (BMI >= 30 kg/m2) and sedentary lifestyle.  Depression Screen (Note: if answer to either of the following is "Yes", a more complete depression screening is indicated)   Q1: Over the past two weeks, have you felt down, depressed or hopeless? No  Q2: Over the past two weeks, have you felt little interest or pleasure in doing things? No  Have you lost interest or  pleasure in daily life? No  Do you often feel hopeless? No  Do you cry easily over simple problems? No  Activities of Daily Living In your present state of health, do you have any difficulty performing the following activities?:  Driving? Yes Managing money?  No Feeding yourself? No Getting from bed to chair? Yes -- prosthesis Climbing a flight of stairs? Yes -- prosthesis Preparing food and eating?: No Bathing or showering? Yes -- prosthesis Getting dressed: Yes -- prosthesis Getting to the toilet? Yes -- prosthesis Using the toilet:No Moving around from place to place: Yes -- prosthesis In the past year have you fallen or had a near fall?:Yes -- due  to poorly fitting prosthesis. New Rx ordered.   Are you sexually active?  Yes  Do you have more than one partner?  No  Hearing Difficulties: No Do you often ask people to speak up or repeat themselves? No Do you experience ringing or noises in your ears? No Do you have difficulty understanding soft or whispered voices? No   Do you feel that you have a problem with memory? No  Do you often misplace items? No  Do you feel safe at home?  Yes  Cognitive Testing  Alert? Yes  Normal Appearance?Yes  Oriented to person? Yes  Place? Yes   Time? Yes  Recall of three objects?  Yes  Can perform simple calculations? Yes  Displays appropriate judgment?Yes  Can read the correct time from a watch face?Yes   Advanced Directives have been discussed with the patient? Yes   List the Names of Other Physician/Practitioners you currently use: See Care Team tab in EMR for comprehensive list.  Indicate any recent Medical Services you may have received from other than [facility] providers in the past year (date may be approximate).  Immunization History  Administered Date(s) Administered  . Influenza,inj,Quad PF,36+ Mos 04/30/2015  . Pneumococcal Polysaccharide-23 03/25/2015    Screening Tests Health Maintenance  Topic Date Due  . OPHTHALMOLOGY  EXAM  02/17/1972  . HIV Screening  02/16/1977  . DTaP/Tdap/Td (1 - Tdap) 02/16/1981  . TETANUS/TDAP  02/16/1981  . COLONOSCOPY  02/17/2012  . HEMOGLOBIN A1C  09/23/2015  . INFLUENZA VACCINE  02/24/2016  . FOOT EXAM  03/24/2016  . PNEUMOCOCCAL POLYSACCHARIDE VACCINE (2) 03/24/2020  . Hepatitis C Screening  Completed    All answers were reviewed with the patient and necessary referrals were made:  Piedad Climes, PA-C   06/27/2015   History reviewed: allergies, current medications, past family history, past medical history, past social history, past surgical history and problem list  Review of Systems Pertinent items noted in HPI and remainder of comprehensive ROS otherwise negative.    Objective:   Vision by Snellen chart: right WUJ:WJXBJYN declines measurement, left WGN:FAOZHYQ declines measurement   Blood pressure 124/83, pulse 97, temperature 98.2 F (36.8 C), temperature source Oral, resp. rate 16, height 5\' 7"  (1.702 m), weight 208 lb 8 oz (94.575 kg), SpO2 99 %. Body mass index is 32.65 kg/(m^2).  General appearance: alert, cooperative, appears stated age and no distress Head: Normocephalic, without obvious abnormality, atraumatic Eyes: conjunctivae/corneas clear. PERRL, EOM's intact. Fundi benign. Ears: normal TM's and external ear canals both ears Nose: Nares normal. Septum midline. Mucosa normal. No drainage or sinus tenderness. Throat: lips, mucosa, and tongue normal; teeth and gums normal Lungs: clear to auscultation bilaterally Heart: regular rate and rhythm, S1, S2 normal, no murmur, click, rub or gallop Abdomen: soft, non-tender; bowel sounds normal; no masses,  no organomegaly Extremities: no edema, varcisosites noted. Pulses of LLE intact and 2+. R popliteal pulse 2+. Patient s/p BKA right lower extremity. Pulses: 2+ and symmetric     Assessment:     (1) Medicare Wellness, Subsequent (2) S/P BKA right knee (3) Chronic Hepatitis C (4) Screening for  Ischemic Heart Disease (5) Prostate Cancer Screening (6) Obesity      Plan:     (1) During the course of the visit the patient was educated and counseled about appropriate screening and preventive services including:    Influenza vaccine  Td vaccine  Screening electrocardiogram  Prostate cancer screening  Diabetes screening  Nutrition counseling  Advanced directives: has NO advanced directive - not interested in additional information  (2) Patient with medical necessity for prosthetic. Order placed and faxed to [facility]. Will refill pain medications, reducing dosing of Oxycodone. Will continue to wean down as patient has further procedures with pain management.  (3) Continue follow-up with GI (Hepatology). Patient to be commended on alcohol cessation.  (4) EKG reveals NSR with rate of 98 bpm. ASA therapy discussed.  (5) Will obtain screening Medicare PSA today after discussing risks and benefits.  (6) Body mass index is 32.65 kg/(m^2). Exercise limited by amputation. Exercises discussed. Diet reviewed.   Patient Instructions (the written plan) was given to the patient.  Medicare Attestation I have personally reviewed: The patient's medical and social history Their use of alcohol, tobacco or illicit drugs Their current medications and supplements The patient's functional ability including ADLs,fall risks, home safety risks, cognitive, and hearing and visual impairment Diet and physical activities Evidence for depression or mood disorders  The patient's weight, height, BMI, and visual acuity have been recorded in the chart.  I have made referrals, counseling, and provided education to the patient based on review of the above and I have provided the patient with a written personalized care plan for preventive services.     Marcelline MatesMartin, Aishah Teffeteller [place], [place]PA-C   06/27/2015

## 2015-06-29 DIAGNOSIS — E669 Obesity, unspecified: Secondary | ICD-10-CM | POA: Insufficient documentation

## 2015-06-30 ENCOUNTER — Telehealth: Payer: Self-pay | Admitting: *Deleted

## 2015-06-30 NOTE — Telephone Encounter (Signed)
Forwarded to Cody. JG//CMA 

## 2015-07-03 DIAGNOSIS — B182 Chronic viral hepatitis C: Secondary | ICD-10-CM | POA: Diagnosis not present

## 2015-07-03 DIAGNOSIS — K7030 Alcoholic cirrhosis of liver without ascites: Secondary | ICD-10-CM | POA: Diagnosis not present

## 2015-07-07 ENCOUNTER — Telehealth: Payer: Self-pay | Admitting: Physician Assistant

## 2015-07-07 NOTE — Telephone Encounter (Signed)
Pt calling to find out what dose of insulin he should be on. He isn't sure if it is 12 or 16 now. Pt requesting call [phone].

## 2015-07-08 NOTE — Telephone Encounter (Signed)
[facility] @ [phone]) asking the pt to RTC regarding note below.//AB/CMA

## 2015-07-08 NOTE — Telephone Encounter (Signed)
Pt called back. Call was dropped after trying to transfer. Please call him with dose of insulin.

## 2015-07-08 NOTE — Telephone Encounter (Signed)
[facility] 6[facility]43[facility] @ [phone]) asking the pt to RTC regarding note on insulin.//AB/CMA

## 2015-07-09 ENCOUNTER — Other Ambulatory Visit: Payer: Self-pay | Admitting: Physician Assistant

## 2015-07-10 NOTE — Telephone Encounter (Signed)
Called and spoke with the pt and informed him again of the recent lab note regarding his insulin.  In[facility] the [facility] that [facility] decrease his Lantus by 4 units if he is getting fasting sugars < 80.  Pt verbalized understanding.  Also informed the pt that he is to follow-up in 3 months.//AB/CMA

## 2015-07-22 ENCOUNTER — Telehealth: Payer: Self-pay | Admitting: Physician Assistant

## 2015-07-22 ENCOUNTER — Encounter: Payer: Self-pay | Admitting: Physical Medicine & Rehabilitation

## 2015-07-22 ENCOUNTER — Ambulatory Visit (HOSPITAL_BASED_OUTPATIENT_CLINIC_OR_DEPARTMENT_OTHER): Payer: Medicare Other | Admitting: Physical Medicine & Rehabilitation

## 2015-07-22 ENCOUNTER — Encounter: Payer: Medicare Other | Attending: Physical Medicine & Rehabilitation

## 2015-07-22 VITALS — BP 142/72 | HR 102 | Resp 16

## 2015-07-22 DIAGNOSIS — Z87891 Personal history of nicotine dependence: Secondary | ICD-10-CM | POA: Diagnosis not present

## 2015-07-22 DIAGNOSIS — G629 Polyneuropathy, unspecified: Secondary | ICD-10-CM | POA: Diagnosis not present

## 2015-07-22 DIAGNOSIS — M25511 Pain in right shoulder: Secondary | ICD-10-CM | POA: Insufficient documentation

## 2015-07-22 DIAGNOSIS — M25561 Pain in right knee: Secondary | ICD-10-CM | POA: Insufficient documentation

## 2015-07-22 DIAGNOSIS — F1010 Alcohol abuse, uncomplicated: Secondary | ICD-10-CM | POA: Diagnosis not present

## 2015-07-22 DIAGNOSIS — G546 Phantom limb syndrome with pain: Secondary | ICD-10-CM | POA: Diagnosis not present

## 2015-07-22 DIAGNOSIS — Z89511 Acquired absence of right leg below knee: Secondary | ICD-10-CM | POA: Insufficient documentation

## 2015-07-22 DIAGNOSIS — G8929 Other chronic pain: Secondary | ICD-10-CM | POA: Insufficient documentation

## 2015-07-22 DIAGNOSIS — I10 Essential (primary) hypertension: Secondary | ICD-10-CM | POA: Insufficient documentation

## 2015-07-22 DIAGNOSIS — M24812 Other specific joint derangements of left shoulder, not elsewhere classified: Secondary | ICD-10-CM | POA: Diagnosis not present

## 2015-07-22 DIAGNOSIS — M47816 Spondylosis without myelopathy or radiculopathy, lumbar region: Secondary | ICD-10-CM | POA: Diagnosis not present

## 2015-07-22 DIAGNOSIS — M25512 Pain in left shoulder: Secondary | ICD-10-CM | POA: Insufficient documentation

## 2015-07-22 DIAGNOSIS — Z79891 Long term (current) use of opiate analgesic: Secondary | ICD-10-CM | POA: Insufficient documentation

## 2015-07-22 DIAGNOSIS — E119 Type 2 diabetes mellitus without complications: Secondary | ICD-10-CM | POA: Insufficient documentation

## 2015-07-22 DIAGNOSIS — M545 Low back pain: Secondary | ICD-10-CM | POA: Diagnosis not present

## 2015-07-22 NOTE — Progress Notes (Signed)
  PROCEDURE RECORD [facility]   Name: Justin Stone DOB:1960-11-27 MRN: [id number]  Date:07/22/2015  Physician: Claudette LawsAndrew Kirsteins, MD    Nurse/CMA: Kathrine HaddockAmber Wood, CMA  Allergies: No Known Allergies  Consent Signed: Yes.    Is patient diabetic? Yes.    CBG today? Did not check this morning  Pregnant: No. LMP: No LMP for male patient. (age 53-55)  Anticoagulants: no Anti-inflammatory: no Antibiotics: no  Procedure: Bilateral Medial Branch Block L 3-4-5 Position: Prone Start Time: 11:22am End Time: 11:34am Fluoro Time: 42  RN/CMA Amber [employer]Wood Amber Wood    Time 10:52 am 11:40am    BP 142/72 148/64    Pulse 102 98    Respirations 16 16    O2 Sat 99 99    S/S 6 6    Pain Level 7/10 4/10     D/C home with [employer], patient A & O X 3, D/C instructions reviewed, and sits independently.

## 2015-07-22 NOTE — Progress Notes (Signed)
Bilateral Lumbar L3, L4  medial branch blocks and L 5 dorsal ramus injection under fluoroscopic guidance  Indication: Lumbar pain which is not relieved by medication management or other conservative care and interfering with self-care and mobility.  Informed consent was obtained after describing risks and benefits of the procedure with the patient, this includes bleeding, infection, paralysis and medication side effects.  The patient wishes to proceed and has given written consent.  The patient was placed in prone position.  The lumbar area was marked and prepped with Betadine.  One mL of 1% lidocaine was injected into each of 6 areas into the skin and subcutaneous tissue.  Then a 22-gauge 5 spinal needle was inserted targeting the junction of the left S1 superior articular process and sacral ala junction. Needle was advanced under fluoroscopic guidance.  Bone contact was made.  Omnipaque 180 was injected x 0.5 mL demonstrating no intravascular uptake.  Then a solution containing one mL of 4 mg per mL dexamethasone and 3 mL of 2% MPF lidocaine was injected x 0.5 mL.  Then the left L5 superior articular process in transverse process junction was targeted.  Bone contact was made.  Omnipaque 180 was injected x 0.5 mL demonstrating no intravascular uptake. Then a solution containing one mL of 4 mg per mL dexamethasone and 3 mL of 2% MPF lidocaine was injected x 0.5 mL.  Then the left L4 superior articular process in transverse process junction was targeted.  Bone contact was made.  Omnipaque 180 was injected x 0.5 mL demonstrating no intravascular uptake.  Then a solution containing one mL of 4 mg per mL dexamethasone and 3 mL if 2% MPF lidocaine was injected x 0.5 mL.  This same procedure was performed on the right side using the same needle, technique and injectate.  Patient tolerated procedure well.  Post procedure instructions were given. 

## 2015-07-22 NOTE — Patient Instructions (Signed)

## 2015-07-22 NOTE — Telephone Encounter (Signed)
Caller name: Self   Can be reached: [phone]    Reason for call: Request rx for Crut[facility] fax # [phone]

## 2015-07-23 NOTE — Telephone Encounter (Signed)
Called and spoke with the pt and he was requesting for the [facility] that was [facility] to [facility] to be faxed to the medical supply store listed below.  Informed the pt that the prescription with the order was given to him on (06/27/15).  We do not have the order.  Pt went to get the paperwork from the visit on (06/27/15),and noticed that he had the prescription with him.  Pt stated that he will take the prescription to the supply store.//AB/CMA

## 2015-07-30 ENCOUNTER — Encounter: Payer: Self-pay | Admitting: Physician Assistant

## 2015-07-30 ENCOUNTER — Ambulatory Visit (INDEPENDENT_AMBULATORY_CARE_PROVIDER_SITE_OTHER): Payer: Medicare Other | Admitting: Physician Assistant

## 2015-07-30 ENCOUNTER — Telehealth: Payer: Self-pay | Admitting: *Deleted

## 2015-07-30 VITALS — BP 130/60 | HR 115 | Temp 98.6°F | Ht 67.0 in | Wt 219.4 lb

## 2015-07-30 DIAGNOSIS — G8929 Other chronic pain: Secondary | ICD-10-CM | POA: Diagnosis not present

## 2015-07-30 DIAGNOSIS — M545 Low back pain: Secondary | ICD-10-CM | POA: Diagnosis not present

## 2015-07-30 DIAGNOSIS — B182 Chronic viral hepatitis C: Secondary | ICD-10-CM

## 2015-07-30 MED ORDER — MORPHINE SULFATE ER 15 MG PO TBCR: 15.0000 mg | EXTENDED_RELEASE_TABLET | Freq: Two times a day (BID) | ORAL | Status: DC

## 2015-07-30 MED ORDER — OXYCODONE HCL 10 MG PO TABS: 10.0000 mg | ORAL_TABLET | Freq: Two times a day (BID) | ORAL | Status: DC | PRN

## 2015-07-30 NOTE — Progress Notes (Signed)
Pre visit review using our clinic review tool, if applicable. No additional management support is needed unless otherwise documented below in the visit note. 

## 2015-07-30 NOTE — Telephone Encounter (Signed)
Burgess EstelleJamie Atkins from [facility] called requesting a handwritten Rx for a new prosthetic that includes more detail. She is asking for clinical notes that can help support the patient's need for a new prosthetic. She has faxed over a more detailed rx script that can be filled out and signed. This is currently with Barbara CowerJason.  Mr. Susa Raringlsop called too asking where we were on the process. I in formed him that a new fax had been sent to us and that we will be dealing with it as soon as as possible

## 2015-07-30 NOTE — Progress Notes (Signed)
Patient presents to clinic today for follow-up regarding pain management. Patient is still followed by Pain clinic for non-narcotic therapy due to history of alcoholic cirrhosis and Hep C infection being treated currently with [facility]. We have been weaning Justin Stone down off of pain medications as we are not a pain clinic and will not continue narcotics long-term due to health issues. Endorses taking medications as prescribed. Feels doing well overall with current regimen. Endorses phantom limb pain daily but is helped with Gabapentin 600 mg TID. Is scheduled to see pain management for another injection in the next couple of weeks.  Past Medical History  Diagnosis Date  . Diabetes mellitus without complication ([place])   . Hypertension     Current Outpatient Prescriptions on File Prior to Visit  Medication Sig Dispense Refill  . ALPRAZolam (XANAX) 1 MG tablet Take 1 tablet (1 mg total) by mouth at bedtime as needed. 30 tablet 2  . amLODipine (NORVASC) 10 MG tablet Take 1 tablet (10 mg total) by mouth daily. 30 tablet 5  . B-D ULTRAFINE III SHORT PEN 31G X 8 MM MISC USE 1 NEEDLE DAILY OR AS DIRECTED.  1  . carisoprodol (SOMA) 350 MG tablet Take 1 tablet (350 mg total) by mouth 3 (three) times daily. 90 tablet 1  . furosemide (LASIX) 40 MG tablet Take 40 mg by mouth daily.    Marland Kitchen gabapentin (NEURONTIN) 300 MG capsule TAKE 2 CAPSULES BY MOUTH THREE TIMES DAILY 540 capsule 0  . Insulin Syringe-Needle U-100 (INSULIN SYRINGE 1CC/31GX5/16") 31G X 5/16" 1 ML MISC USE 1 SYRINGE D OR UTD.  0  . LANTUS SOLOSTAR 100 UNIT/ML Solostar Pen Inject 20 Units into the skin at bedtime.  0  . lisinopril (PRINIVIL,ZESTRIL) 10 MG tablet Take 10 mg by mouth daily.    . ONE TOUCH ULTRA TEST test strip 2 (two) times daily. for testing  3  . ONETOUCH DELICA LANCETS 39J MISC USE 1 LANCET BID OR AS DIRECTED.  3  . simvastatin (ZOCOR) 10 MG tablet Take 10 mg by mouth at bedtime.    Marland Kitchen venlafaxine (EFFEXOR) 75 MG tablet Take  1 tablet (75 mg total) by mouth 2 (two) times daily. 180 tablet 1   No current facility-administered medications on file prior to visit.    No Known Allergies  Family History  Problem Relation Age of Onset  . Diabetes Maternal Uncle   . Diabetes Maternal Grandmother     Social History   Social History  . Marital Status: Single    Spouse Name: N/A  . Number of Children: N/A  . Years of Education: N/A   Social History Main Topics  . Smoking status: Former Research scientist (life sciences)  . Smokeless tobacco: None  . Alcohol Use: Yes  . Drug Use: No  . Sexual Activity: Not Asked   Other Topics Concern  . None   Social History Narrative   Review of Systems - See HPI.  All other ROS are negative.  BP 130/60 mmHg  Pulse 115  Temp(Src) 98.6 F (37 C) (Oral)  Ht 5' 7" (1.702 m)  Wt 219 lb 6.4 oz (99.519 kg)  BMI 34.35 kg/m2  SpO2 99%  Physical Exam  Constitutional: Justin Stone is oriented to person, place, and time and well-developed, well-nourished, and in no distress.  HENT:  Head: Normocephalic and atraumatic.  Cardiovascular: Normal rate.   Pulmonary/Chest: Effort normal and breath sounds normal.  Musculoskeletal:  R BKA noted. No prothesis in place today  Neurological: Justin Stone is alert and oriented to person, place, and time.  Skin: Skin is warm and dry. No rash noted.  Vitals reviewed.   Recent Results (from the past 2160 hour(s))  Comp Met (CMET)     Status: Abnormal   Collection Time: 06/27/15 10:19 AM  Result Value Ref Range   Sodium 136 135 - 145 mEq/L   Potassium 3.8 3.5 - 5.1 mEq/L   Chloride 100 96 - 112 mEq/L   CO2 24 19 - 32 mEq/L   Glucose, Bld 148 (H) 70 - 99 mg/dL   BUN 26 (H) 6 - 23 mg/dL   Creatinine, Ser 1.48 0.40 - 1.50 mg/dL   Total Bilirubin 1.2 0.2 - 1.2 mg/dL   Alkaline Phosphatase 79 39 - 117 U/L   AST 55 (H) 0 - 37 U/L   ALT 52 0 - 53 U/L   Total Protein 8.9 (H) 6.0 - 8.3 g/dL   Albumin 3.9 3.5 - 5.2 g/dL   Calcium 9.7 8.4 - 10.5 mg/dL   GFR 63.86 >60.00 mL/min   TSH     Status: None   Collection Time: 06/27/15 10:19 AM  Result Value Ref Range   TSH 1.38 0.35 - 4.50 uIU/mL  Hemoglobin A1c     Status: Abnormal   Collection Time: 06/27/15 10:19 AM  Result Value Ref Range   Hgb A1c MFr Bld 4.3 (L) 4.6 - 6.5 %    Comment: Glycemic Control Guidelines for People with Diabetes:Non Diabetic:  <6%Goal of Therapy: <7%Additional Action Suggested:  >8%   Lipid Profile     Status: Abnormal   Collection Time: 06/27/15 10:19 AM  Result Value Ref Range   Cholesterol 165 0 - 200 mg/dL    Comment: ATP III Classification       Desirable:  < 200 mg/dL               Borderline High:  200 - 239 mg/dL          High:  > = 240 mg/dL   Triglycerides 84.0 0.0 - 149.0 mg/dL    Comment: Normal:  <150 mg/dLBorderline High:  150 - 199 mg/dL   HDL 40.00 >39.00 mg/dL   VLDL 16.8 0.0 - 40.0 mg/dL   LDL Cholesterol 108 (H) 0 - 99 mg/dL   Total CHOL/HDL Ratio 4     Comment:                Men          Women1/2 Average Risk     3.4          3.3Average Risk          5.0          4.42X Average Risk          9.6          7.13X Average Risk          15.0          11.0                       NonHDL 124.58     Comment: NOTE:  Non-HDL goal should be 30 mg/dL higher than patient's LDL goal (i.e. LDL goal of < 70 mg/dL, would have non-HDL goal of < 100 mg/dL)  PSA, Medicare     Status: None   Collection Time: 06/27/15 10:19 AM  Result Value Ref Range   PSA 2.88 0.10 - 4.00 ng/ml  Assessment/Plan: Chronic low back pain Patient still without prosthesis so hopefully will be getting soon. Upcoming injections with Pain Clinic. Will continue current regimen this month until prosthetic is obtained as use of crutches is straining his back and worsening pain. Will increase nighttime dose of Gabapentin to 900 mg.

## 2015-07-30 NOTE — Patient Instructions (Signed)
Please continue pain medication as directed for this month.  Please increase nighttime gabapentin dose to 900 mg (3 capsules).  Follow-up with Dr. Merceda ElksKierstein as directed.  We are rechecking your liver enzymes so do not forget to stop by the lab on your way out. I will call with your results.

## 2015-07-30 NOTE — Assessment & Plan Note (Signed)
Patient still without prosthesis so hopefully will be getting soon. Upcoming injections with Pain Clinic. Will continue current regimen this month until prosthetic is obtained as use of crutches is straining his back and worsening pain. Will increase nighttime dose of Gabapentin to 900 mg.

## 2015-07-31 LAB — HEPATIC FUNCTION PANEL
ALK PHOS: 90 U/L (ref 39–117)
ALT: 75 U/L — ABNORMAL HIGH (ref 0–53)
AST: 72 U/L — AB (ref 0–37)
Albumin: 4 g/dL (ref 3.5–5.2)
BILIRUBIN DIRECT: 0.3 mg/dL (ref 0.0–0.3)
TOTAL PROTEIN: 8.7 g/dL — AB (ref 6.0–8.3)
Total Bilirubin: 0.7 mg/dL (ref 0.2–1.2)

## 2015-08-21 ENCOUNTER — Ambulatory Visit: Payer: Medicare Other | Admitting: Physical Medicine & Rehabilitation

## 2015-08-26 ENCOUNTER — Encounter: Payer: Self-pay | Admitting: Physician Assistant

## 2015-08-26 ENCOUNTER — Ambulatory Visit (INDEPENDENT_AMBULATORY_CARE_PROVIDER_SITE_OTHER): Payer: Medicare Other | Admitting: Physician Assistant

## 2015-08-26 ENCOUNTER — Ambulatory Visit (HOSPITAL_BASED_OUTPATIENT_CLINIC_OR_DEPARTMENT_OTHER): Payer: Medicare Other | Admitting: Physical Medicine & Rehabilitation

## 2015-08-26 ENCOUNTER — Encounter: Payer: Medicare Other | Attending: Physical Medicine & Rehabilitation

## 2015-08-26 ENCOUNTER — Encounter: Payer: Self-pay | Admitting: Physical Medicine & Rehabilitation

## 2015-08-26 VITALS — BP 139/73 | HR 104 | Temp 98.0°F | Ht 67.0 in | Wt 229.6 lb

## 2015-08-26 VITALS — BP 129/71 | HR 100

## 2015-08-26 DIAGNOSIS — M545 Low back pain: Secondary | ICD-10-CM

## 2015-08-26 DIAGNOSIS — M24812 Other specific joint derangements of left shoulder, not elsewhere classified: Secondary | ICD-10-CM | POA: Insufficient documentation

## 2015-08-26 DIAGNOSIS — Z87891 Personal history of nicotine dependence: Secondary | ICD-10-CM | POA: Diagnosis not present

## 2015-08-26 DIAGNOSIS — G546 Phantom limb syndrome with pain: Secondary | ICD-10-CM | POA: Insufficient documentation

## 2015-08-26 DIAGNOSIS — M47816 Spondylosis without myelopathy or radiculopathy, lumbar region: Secondary | ICD-10-CM

## 2015-08-26 DIAGNOSIS — I10 Essential (primary) hypertension: Secondary | ICD-10-CM | POA: Diagnosis not present

## 2015-08-26 DIAGNOSIS — G8929 Other chronic pain: Secondary | ICD-10-CM | POA: Diagnosis not present

## 2015-08-26 DIAGNOSIS — G629 Polyneuropathy, unspecified: Secondary | ICD-10-CM | POA: Diagnosis not present

## 2015-08-26 DIAGNOSIS — E119 Type 2 diabetes mellitus without complications: Secondary | ICD-10-CM | POA: Insufficient documentation

## 2015-08-26 DIAGNOSIS — F1010 Alcohol abuse, uncomplicated: Secondary | ICD-10-CM | POA: Insufficient documentation

## 2015-08-26 DIAGNOSIS — M25561 Pain in right knee: Secondary | ICD-10-CM | POA: Diagnosis not present

## 2015-08-26 DIAGNOSIS — Z89511 Acquired absence of right leg below knee: Secondary | ICD-10-CM | POA: Diagnosis not present

## 2015-08-26 DIAGNOSIS — Z79891 Long term (current) use of opiate analgesic: Secondary | ICD-10-CM | POA: Insufficient documentation

## 2015-08-26 DIAGNOSIS — M25511 Pain in right shoulder: Secondary | ICD-10-CM | POA: Insufficient documentation

## 2015-08-26 DIAGNOSIS — M25512 Pain in left shoulder: Secondary | ICD-10-CM | POA: Insufficient documentation

## 2015-08-26 MED ORDER — MORPHINE SULFATE ER 15 MG PO TBCR: 15.0000 mg | EXTENDED_RELEASE_TABLET | Freq: Two times a day (BID) | ORAL | Status: DC

## 2015-08-26 MED ORDER — OXYCODONE HCL 10 MG PO TABS: 10.0000 mg | ORAL_TABLET | Freq: Two times a day (BID) | ORAL | Status: DC | PRN

## 2015-08-26 NOTE — Progress Notes (Signed)
  PROCEDURE RECORD [facility]   Name: Justin Stone DOB:1960-12-27 MRN: [id number]  Date:08/26/2015  Physician: Claudette Laws, MD    Nurse/CMA: Shumaker RN  Allergies: No Known Allergies  Consent Signed: Yes.    Is patient diabetic? Yes.    CBG today?  Pregnant: No. LMP: No LMP for male patient. (age 54-55)  Anticoagulants: no Anti-inflammatory: no Antibiotics: no  Procedure: Bilateral medial branch block Position: Prone Start Time: 11:54 End Time:12:02  Fluoro Time: 35 sec  RN/CMA Designer, multimedia    Time 11:42 12:07    BP 129/71 147/73    Pulse 99 89    Respirations 14 14    O2 Sat 100 99    S/S 6 6    Pain Level 8/10 4-5/10     D/C home with transportation, patient A & O X 3, D/C instructions reviewed, and sits independently.

## 2015-08-26 NOTE — Progress Notes (Signed)
Bilateral Lumbar L3, L4  medial branch blocks and L 5 dorsal ramus injection under fluoroscopic guidance  Indication: Lumbar pain which is not relieved by medication management or other conservative care and interfering with self-care and mobility.  Informed consent was obtained after describing risks and benefits of the procedure with the patient, this includes bleeding, infection, paralysis and medication side effects.  The patient wishes to proceed and has given written consent.  The patient was placed in prone position.  The lumbar area was marked and prepped with Betadine.  One mL of 1% lidocaine was injected into each of 6 areas into the skin and subcutaneous tissue.  Then a 22-gauge 5 spinal needle was inserted targeting the junction of the left S1 superior articular process and sacral ala junction. Needle was advanced under fluoroscopic guidance.  Bone contact was made.  Omnipaque 180 was injected x 0.5 mL demonstrating no intravascular uptake.  Then a solution containing one mL of 4 mg per mL dexamethasone and 3 mL of 2% MPF lidocaine was injected x 0.5 mL.  Then the left L5 superior articular process in transverse process junction was targeted.  Bone contact was made.  Omnipaque 180 was injected x 0.5 mL demonstrating no intravascular uptake. Then a solution containing one mL of 4 mg per mL dexamethasone and 3 mL of 2% MPF lidocaine was injected x 0.5 mL.  Then the left L4 superior articular process in transverse process junction was targeted.  Bone contact was made.  Omnipaque 180 was injected x 0.5 mL demonstrating no intravascular uptake.  Then a solution containing one mL of 4 mg per mL dexamethasone and 3 mL if 2% MPF lidocaine was injected x 0.5 mL.  This same procedure was performed on the right side using the same needle, technique and injectate.  Patient tolerated procedure well.  Post procedure instructions were given. 

## 2015-08-26 NOTE — Progress Notes (Signed)
Pre visit review using our clinic review tool, if applicable. No additional management support is needed unless otherwise documented below in the visit note. 

## 2015-08-26 NOTE — Patient Instructions (Signed)
Please continue medications as directed. Follow-up as scheduled with Dr. Merceda Elks for next procedure.  Follow-up with me in late march. We will work on reducing your pain medications at that time as hopefully these procedures will help relieve your pain long-term.

## 2015-08-26 NOTE — Patient Instructions (Signed)

## 2015-08-26 NOTE — Assessment & Plan Note (Signed)
Had nerve block this AM. Will see how he responds. Is scheduled for prosthetic fitting in February. Feel this will be of great benefit not only to ambulation but for back pain as well. Will continue current medication regimen for the next month but at follow-up in March will continuing weaning off of Oxycodone. Patient encouraged to follow-up with pain management as scheduled.

## 2015-08-26 NOTE — Progress Notes (Signed)
Patient presents to clinic today following up on chronic pain of lumbar region and of RLE. Patient was seen by his pain specialist this morning and had a bilateral medical branch block performed. Is scheduled for another procedure in early February.  Has finally been scheduled to have new prosthetic fitted. Denies new or worsening symptoms.  Past Medical History  Diagnosis Date  . Diabetes mellitus without complication ([facility])   . Hypertension     Current Outpatient Prescriptions on File Prior to Visit  Medication Sig Dispense Refill  . ALPRAZolam (XANAX) 1 MG tablet Take 1 tablet (1 mg total) by mouth at bedtime as needed. 30 tablet 2  . amLODipine (NORVASC) 10 MG tablet Take 1 tablet (10 mg total) by mouth daily. 30 tablet 5  . B-D ULTRAFINE III SHORT PEN 31G X 8 MM MISC USE 1 NEEDLE DAILY OR AS DIRECTED.  1  . carisoprodol (SOMA) 350 MG tablet Take 1 tablet (350 mg total) by mouth 3 (three) times daily. 90 tablet 1  . furosemide (LASIX) 40 MG tablet Take 40 mg by mouth daily.    Marland Kitchen gabapentin (NEURONTIN) 300 MG capsule TAKE 2 CAPSULES BY MOUTH THREE TIMES DAILY 540 capsule 0  . HARVONI 90-400 MG TABS TAKE 1 TABLE  BY MOUTH IN MORNING.    . Insulin Syringe-Needle U-100 (INSULIN SYRINGE 1CC/31GX5/16") 31G X 5/16" 1 ML MISC USE 1 SYRINGE D OR UTD.  0  . LANTUS SOLOSTAR 100 UNIT/ML Solostar Pen Inject 20 Units into the skin at bedtime.  0  . lisinopril (PRINIVIL,ZESTRIL) 10 MG tablet Take 10 mg by mouth daily.    . ONE TOUCH ULTRA TEST test strip 2 (two) times daily. for testing  3  . ONETOUCH DELICA LANCETS 83J MISC USE 1 LANCET BID OR AS DIRECTED.  3  . ribavirin (COPEGUS) 200 MG tablet TAKE 3 TABLES BY MOUTH IN THE MORNING,THEN TAKE 3 TABLETS BY MOUTH IN THE EVENING.    . simvastatin (ZOCOR) 10 MG tablet Take 10 mg by mouth at bedtime.    Marland Kitchen venlafaxine (EFFEXOR) 75 MG tablet Take 1 tablet (75 mg total) by mouth 2 (two) times daily. 180 tablet 1   No current facility-administered  medications on file prior to visit.    No Known Allergies  Family History  Problem Relation Age of Onset  . Diabetes Maternal Uncle   . Diabetes Maternal Grandmother     Social History   Social History  . Marital Status: Single    Spouse Name: N/A  . Number of Children: N/A  . Years of Education: N/A   Social History Main Topics  . Smoking status: Former Research scientist (life sciences)  . Smokeless tobacco: None  . Alcohol Use: Yes  . Drug Use: No  . Sexual Activity: Not Asked   Other Topics Concern  . None   Social History Narrative    Review of Systems - See HPI.  All other ROS are negative.  BP 139/73 mmHg  Pulse 104  Temp(Src) 98 F (36.7 C) (Oral)  Ht 5' 7"  (1.702 m)  Wt 229 lb 9.6 oz (104.146 kg)  BMI 35.95 kg/m2  SpO2 99%  Physical Exam  Constitutional: He is oriented to person, place, and time and well-developed, well-nourished, and in no distress.  HENT:  Head: Normocephalic and atraumatic.  Cardiovascular: Normal rate, regular rhythm, normal heart sounds and intact distal pulses.   Pulmonary/Chest: Effort normal and breath sounds normal. No respiratory distress. He has no wheezes. He has  no rales. He exhibits no tenderness.  Neurological: He is alert and oriented to person, place, and time.  Skin: Skin is warm and dry. No rash noted.  Psychiatric: Affect normal.  Vitals reviewed.   Recent Results (from the past 2160 hour(s))  Comp Met (CMET)     Status: Abnormal   Collection Time: 06/27/15 10:19 AM  Result Value Ref Range   Sodium 136 135 - 145 mEq/L   Potassium 3.8 3.5 - 5.1 mEq/L   Chloride 100 96 - 112 mEq/L   CO2 24 19 - 32 mEq/L   Glucose, Bld 148 (H) 70 - 99 mg/dL   BUN 26 (H) 6 - 23 mg/dL   Creatinine, Ser 1.48 0.40 - 1.50 mg/dL   Total Bilirubin 1.2 0.2 - 1.2 mg/dL   Alkaline Phosphatase 79 39 - 117 U/L   AST 55 (H) 0 - 37 U/L   ALT 52 0 - 53 U/L   Total Protein 8.9 (H) 6.0 - 8.3 g/dL   Albumin 3.9 3.5 - 5.2 g/dL   Calcium 9.7 8.4 - 10.5 mg/dL   GFR  63.86 >60.00 mL/min  TSH     Status: None   Collection Time: 06/27/15 10:19 AM  Result Value Ref Range   TSH 1.38 0.35 - 4.50 uIU/mL  Hemoglobin A1c     Status: Abnormal   Collection Time: 06/27/15 10:19 AM  Result Value Ref Range   Hgb A1c MFr Bld 4.3 (L) 4.6 - 6.5 %    Comment: Glycemic Control Guidelines for People with Diabetes:Non Diabetic:  <6%Goal of Therapy: <7%Additional Action Suggested:  >8%   Lipid Profile     Status: Abnormal   Collection Time: 06/27/15 10:19 AM  Result Value Ref Range   Cholesterol 165 0 - 200 mg/dL    Comment: ATP III Classification       Desirable:  < 200 mg/dL               Borderline High:  200 - 239 mg/dL          High:  > = 240 mg/dL   Triglycerides 84.0 0.0 - 149.0 mg/dL    Comment: Normal:  <150 mg/dLBorderline High:  150 - 199 mg/dL   HDL 40.00 >39.00 mg/dL   VLDL 16.8 0.0 - 40.0 mg/dL   LDL Cholesterol 108 (H) 0 - 99 mg/dL   Total CHOL/HDL Ratio 4     Comment:                Men          Women1/2 Average Risk     3.4          3.3Average Risk          5.0          4.42X Average Risk          9.6          7.13X Average Risk          15.0          11.0                       NonHDL 124.58     Comment: NOTE:  Non-HDL goal should be 30 mg/dL higher than patient's LDL goal (i.e. LDL goal of < 70 mg/dL, would have non-HDL goal of < 100 mg/dL)  PSA, Medicare     Status: None   Collection Time: 06/27/15 10:19 AM  Result  Value Ref Range   PSA 2.88 0.10 - 4.00 ng/ml  Hepatic function panel     Status: Abnormal   Collection Time: 07/30/15  2:58 PM  Result Value Ref Range   Total Bilirubin 0.7 0.2 - 1.2 mg/dL   Bilirubin, Direct 0.3 0.0 - 0.3 mg/dL   Alkaline Phosphatase 90 39 - 117 U/L   AST 72 (H) 0 - 37 U/L   ALT 75 (H) 0 - 53 U/L   Total Protein 8.7 (H) 6.0 - 8.3 g/dL   Albumin 4.0 3.5 - 5.2 g/dL   Assessment/Plan: Chronic low back pain Had nerve block this AM. Will see how he responds. Is scheduled for prosthetic fitting in February. Feel this  will be of great benefit not only to ambulation but for back pain as well. Will continue current medication regimen for the next month but at follow-up in March will continuing weaning off of Oxycodone. Patient encouraged to follow-up with pain management as scheduled.

## 2015-08-27 ENCOUNTER — Other Ambulatory Visit: Payer: Self-pay | Admitting: Physician Assistant

## 2015-09-16 ENCOUNTER — Ambulatory Visit (HOSPITAL_BASED_OUTPATIENT_CLINIC_OR_DEPARTMENT_OTHER): Payer: Medicare Other | Admitting: Physical Medicine & Rehabilitation

## 2015-09-16 ENCOUNTER — Encounter: Payer: Medicare Other | Attending: Physical Medicine & Rehabilitation

## 2015-09-16 ENCOUNTER — Encounter: Payer: Self-pay | Admitting: Physical Medicine & Rehabilitation

## 2015-09-16 VITALS — BP 144/68 | HR 90

## 2015-09-16 DIAGNOSIS — I10 Essential (primary) hypertension: Secondary | ICD-10-CM | POA: Insufficient documentation

## 2015-09-16 DIAGNOSIS — Z89511 Acquired absence of right leg below knee: Secondary | ICD-10-CM | POA: Diagnosis not present

## 2015-09-16 DIAGNOSIS — M24812 Other specific joint derangements of left shoulder, not elsewhere classified: Secondary | ICD-10-CM | POA: Insufficient documentation

## 2015-09-16 DIAGNOSIS — M25511 Pain in right shoulder: Secondary | ICD-10-CM | POA: Insufficient documentation

## 2015-09-16 DIAGNOSIS — Z87891 Personal history of nicotine dependence: Secondary | ICD-10-CM | POA: Insufficient documentation

## 2015-09-16 DIAGNOSIS — Z79891 Long term (current) use of opiate analgesic: Secondary | ICD-10-CM | POA: Insufficient documentation

## 2015-09-16 DIAGNOSIS — G546 Phantom limb syndrome with pain: Secondary | ICD-10-CM | POA: Diagnosis not present

## 2015-09-16 DIAGNOSIS — E119 Type 2 diabetes mellitus without complications: Secondary | ICD-10-CM | POA: Diagnosis not present

## 2015-09-16 DIAGNOSIS — G8929 Other chronic pain: Secondary | ICD-10-CM | POA: Insufficient documentation

## 2015-09-16 DIAGNOSIS — G629 Polyneuropathy, unspecified: Secondary | ICD-10-CM | POA: Insufficient documentation

## 2015-09-16 DIAGNOSIS — M25512 Pain in left shoulder: Secondary | ICD-10-CM | POA: Insufficient documentation

## 2015-09-16 DIAGNOSIS — F1010 Alcohol abuse, uncomplicated: Secondary | ICD-10-CM | POA: Diagnosis not present

## 2015-09-16 DIAGNOSIS — M25561 Pain in right knee: Secondary | ICD-10-CM | POA: Insufficient documentation

## 2015-09-16 DIAGNOSIS — M545 Low back pain: Secondary | ICD-10-CM | POA: Diagnosis not present

## 2015-09-16 DIAGNOSIS — M47816 Spondylosis without myelopathy or radiculopathy, lumbar region: Secondary | ICD-10-CM | POA: Diagnosis not present

## 2015-09-16 NOTE — Patient Instructions (Signed)
You had a radio frequency procedure today This was done to alleviate joint pain in your lumbar area We injected a combination of dexamethasone which is a steroid as well as lidocaine which is a local anesthetic. Dexamethasone made increased blood sugars you are diabetic You may experience soreness at the injection sites. You may also experienced some irritation of the nerves that were heated I'm recommending ice for 30 minutes every 2 hours as needed for the next 24-48 hours   

## 2015-09-16 NOTE — Progress Notes (Addendum)
  PROCEDURE RECORD [facility]   Name: RENLEY BANWART DOB:[birth year] MRN: [id number]  Date:09/16/2015  Physician: Claudette Laws, MD    Nurse/CMA: Addysin Porco RN  Allergies: No Known Allergies  Consent Signed: Yes.    Is patient diabetic? Yes.    CBG today? 108  Pregnant: No. LMP: No LMP for male patient. (age 54-55)  Anticoagulants: no Anti-inflammatory: no Antibiotics: no  Procedure:right radiofrequency neurotomy L 3-4-5  Position: Prone Start Time: 12:50 End Time: 1:12 Fluoro Time: 53 sec  RN/CMA  Arts administrator    Time 12:20  1:17    BP 144/68 162/81    Pulse 90 98    Respirations 16 16    O2 Sat 98 100    S/S 6 6    Pain Level 5/10      D/C home with Jonny Ruiz, patient A & O X 3, D/C instructions reviewed, and sits independently.

## 2015-09-16 NOTE — Progress Notes (Signed)
RightL5 dorsal ramus., Right L4 and Right L3 medial branch radio frequency neurotomy under fluoroscopic guidance   Indication: Low back pain due to lumbar spondylosis which has been relieved on 2 occasions by greater than 50% by lumbar medial branch blocks at corresponding levels.  Informed consent was obtained after describing risks and benefits of the procedure with the patient, this includes bleeding, bruising, infection, paralysis and medication side effects. The patient wishes to proceed and has given written consent. The patient was placed in a prone position. The lumbar and sacral area was marked and prepped with Betadine. A 25-gauge 1-1/2 inch needle was inserted into the skin and subcutaneous tissue at 3 sites in one ML of 1% lidocaine was injected into each site. Then a 20-gauge 15 cm radio frequency needle with a 1 cm curved active tip was inserted targeting the Right S1 SAP/sacral ala junction. Bone contact was made and confirmed with lateral imaging. Sensory stimulation at 50 Hz followed by motor stimulation at 2 Hz confirm proper needle location followed by injection of one ML of the solution containing one ML of 4 mg per mL dexamethasone and 3 mL of 1% MPF lidocaine. Then the Right L5 SAP/transverse process junction was targeted. Bone contact was made and confirmed with lateral imaging. Sensory stimulation at 50 Hz followed by motor stimulation at 2 Hz confirm proper needle location followed by injection of one ML of the solution containing one ML of 4 mg per mL dexamethasone and 3 mL of 1% MPF lidocaine. Then the Right L4 SAP/transverse process junction was targeted. Bone contact was made and confirmed with lateral imaging. Sensory stimulation at 50 Hz followed by motor stimulation at 2 Hz confirm proper needle location followed by injection of one ML of the solution containing one ML of 4 mg per mL dexamethasone and 3 mL of 1% MPF lidocaine. Radio frequency lesion being at [facility] for 90 seconds  was performed. Needles were removed. Post procedure instructions and vital signs were performed. Patient tolerated procedure well. Followup appointment was given.  Should be able to use 20-gauge 10cm RF needles for the left side

## 2015-09-18 ENCOUNTER — Other Ambulatory Visit: Payer: Self-pay | Admitting: Physician Assistant

## 2015-09-18 NOTE — Telephone Encounter (Signed)
Last refill 07/09/2015  #540 with 0 refills Last Office visit on 08/26/2015

## 2015-09-29 ENCOUNTER — Other Ambulatory Visit: Payer: Self-pay | Admitting: Physician Assistant

## 2015-10-03 ENCOUNTER — Encounter: Payer: Self-pay | Admitting: Physician Assistant

## 2015-10-03 ENCOUNTER — Ambulatory Visit (INDEPENDENT_AMBULATORY_CARE_PROVIDER_SITE_OTHER): Payer: Medicare Other | Admitting: Physician Assistant

## 2015-10-03 VITALS — BP 110/70 | HR 107 | Temp 97.8°F | Ht 67.0 in | Wt 192.4 lb

## 2015-10-03 DIAGNOSIS — M545 Low back pain: Secondary | ICD-10-CM | POA: Diagnosis not present

## 2015-10-03 DIAGNOSIS — G8929 Other chronic pain: Secondary | ICD-10-CM

## 2015-10-03 MED ORDER — MORPHINE SULFATE ER 15 MG PO TBCR: 15.0000 mg | EXTENDED_RELEASE_TABLET | Freq: Two times a day (BID) | ORAL | Status: DC

## 2015-10-03 MED ORDER — OXYCODONE HCL 10 MG PO TABS: 10.0000 mg | ORAL_TABLET | Freq: Two times a day (BID) | ORAL | Status: DC | PRN

## 2015-10-03 NOTE — Patient Instructions (Signed)
Please go to follow-up with Pain Management. You can talk to them about taking over pain medications. If they are willing or wanting to take that over I am fine with that.  Please take medications as directed. Follow-up with me in 3 months. Return sooner if needed.

## 2015-10-03 NOTE — Assessment & Plan Note (Signed)
Has been out of medication for several days with flare in pain. Medications refilled. He is to take as directed. Follow-up with Pain Management as directed for continued procedures. Follow-up here in 3 months.

## 2015-10-03 NOTE — Progress Notes (Signed)
Pre visit review using our clinic review tool, if applicable. No additional management support is needed unless otherwise documented below in the visit note. 

## 2015-10-03 NOTE — Progress Notes (Signed)
Patient presents to clinic today for follow-up of medication mangement for chronic pain. Patient underwent a nerve block on 09/16/15. Endorses significant relief in phantom nerve pain since that time but low back pain has been worse since the procedure.  Patient states he has not followed up with Pain Management regarding this change in pain. Has an appointment next week. Patient endorses increased stiffness of lower back, especially in mornings. Is taking medications as directed with some relief in symptoms.   Past Medical History  Diagnosis Date  . Diabetes mellitus without complication ([facility])   . Hypertension     Current Outpatient Prescriptions on File Prior to Visit  Medication Sig Dispense Refill  . ALPRAZolam (XANAX) 1 MG tablet TAKE 1 TABLET BY MOUTH AT BEDTIME AS NEEDED 30 tablet 1  . amLODipine (NORVASC) 10 MG tablet Take 1 tablet (10 mg total) by mouth daily. 30 tablet 5  . B-D ULTRAFINE III SHORT PEN 31G X 8 MM MISC USE 1 NEEDLE DAILY OR AS DIRECTED.  1  . carisoprodol (SOMA) 350 MG tablet TAKE 1 TABLET BY MOUTH THREE TIMES DAILY 90 tablet 1  . furosemide (LASIX) 40 MG tablet Take 40 mg by mouth daily.    Marland Kitchen gabapentin (NEURONTIN) 300 MG capsule TAKE 2 CAPSULES BY MOUTH THREE TIMES DAILY 540 capsule 0  . Insulin Syringe-Needle U-100 (INSULIN SYRINGE 1CC/31GX5/16") 31G X 5/16" 1 ML MISC USE 1 SYRINGE D OR UTD.  0  . LANTUS SOLOSTAR 100 UNIT/ML Solostar Pen Inject 20 Units into the skin at bedtime. Sliding scale  0  . lisinopril (PRINIVIL,ZESTRIL) 10 MG tablet Take 10 mg by mouth daily.    . ONE TOUCH ULTRA TEST test strip 2 (two) times daily. for testing  3  . ONETOUCH DELICA LANCETS 33G MISC USE 1 LANCET BID OR AS DIRECTED.  3  . ribavirin (COPEGUS) 200 MG tablet TAKE 3 TABLES BY MOUTH IN THE MORNING,THEN TAKE 3 TABLETS BY MOUTH IN THE EVENING.    . simvastatin (ZOCOR) 10 MG tablet Take 10 mg by mouth at bedtime.    Marland Kitchen venlafaxine (EFFEXOR) 75 MG tablet Take 1 tablet (75 mg total)  by mouth 2 (two) times daily. 180 tablet 1   No current facility-administered medications on file prior to visit.    No Known Allergies  Family History  Problem Relation Age of Onset  . Diabetes Maternal Uncle   . Diabetes Maternal Grandmother     Social History   Social History  . Marital Status: Single    Spouse Name: N/A  . Number of Children: N/A  . Years of Education: N/A   Social History Main Topics  . Smoking status: Former Games developer  . Smokeless tobacco: None  . Alcohol Use: Yes  . Drug Use: No  . Sexual Activity: Not Asked   Other Topics Concern  . None   Social History Narrative   Review of Systems - See HPI.  All other ROS are negative.  BP 110/70 mmHg  Pulse 107  Temp(Src) 97.8 F (36.6 C) (Oral)  Ht  (1.702 m)  Wt 192 lb 6.4 oz (87.272 kg)  BMI 30.13 kg/m2  SpO2 97%  Physical Exam  Constitutional: He is oriented to person, place, and time and well-developed, well-nourished, and in no distress.  HENT:  Head: Normocephalic and atraumatic.  Eyes: Conjunctivae are normal.  Cardiovascular: Normal rate, regular rhythm, normal heart sounds and intact distal pulses.   Pulmonary/Chest: Effort normal and breath sounds normal.  No respiratory distress. He has no wheezes. He has no rales. He exhibits no tenderness.  Neurological: He is alert and oriented to person, place, and time.  Skin: Skin is warm and dry. No rash noted.  Psychiatric: Affect normal.  Vitals reviewed.  Recent Results (from the past 2160 hour(s))  Hepatic function panel     Status: Abnormal   Collection Time: 07/30/15  2:58 PM  Result Value Ref Range   Total Bilirubin 0.7 0.2 - 1.2 mg/dL   Bilirubin, Direct 0.3 0.0 - 0.3 mg/dL   Alkaline Phosphatase 90 39 - 117 U/L   AST 72 (H) 0 - 37 U/L   ALT 75 (H) 0 - 53 U/L   Total Protein 8.7 (H) 6.0 - 8.3 g/dL   Albumin 4.0 3.5 - 5.2 g/dL   Assessment/Plan: Chronic low back pain Has been out of medication for several days with flare in  pain. Medications refilled. He is to take as directed. Follow-up with Pain Management as directed for continued procedures. Follow-up here in 3 months.

## 2015-10-14 ENCOUNTER — Ambulatory Visit: Payer: Medicare Other

## 2015-10-14 ENCOUNTER — Encounter: Payer: Medicare Other | Attending: Physical Medicine & Rehabilitation

## 2015-10-14 ENCOUNTER — Encounter: Payer: Self-pay | Admitting: Physical Medicine & Rehabilitation

## 2015-10-14 ENCOUNTER — Ambulatory Visit (HOSPITAL_BASED_OUTPATIENT_CLINIC_OR_DEPARTMENT_OTHER): Payer: Medicare Other | Admitting: Physical Medicine & Rehabilitation

## 2015-10-14 VITALS — BP 123/71 | HR 94 | Resp 14

## 2015-10-14 DIAGNOSIS — M25561 Pain in right knee: Secondary | ICD-10-CM | POA: Insufficient documentation

## 2015-10-14 DIAGNOSIS — Z87891 Personal history of nicotine dependence: Secondary | ICD-10-CM | POA: Diagnosis not present

## 2015-10-14 DIAGNOSIS — I10 Essential (primary) hypertension: Secondary | ICD-10-CM | POA: Insufficient documentation

## 2015-10-14 DIAGNOSIS — Z79891 Long term (current) use of opiate analgesic: Secondary | ICD-10-CM | POA: Diagnosis not present

## 2015-10-14 DIAGNOSIS — E119 Type 2 diabetes mellitus without complications: Secondary | ICD-10-CM | POA: Insufficient documentation

## 2015-10-14 DIAGNOSIS — M24812 Other specific joint derangements of left shoulder, not elsewhere classified: Secondary | ICD-10-CM | POA: Insufficient documentation

## 2015-10-14 DIAGNOSIS — G8929 Other chronic pain: Secondary | ICD-10-CM | POA: Diagnosis not present

## 2015-10-14 DIAGNOSIS — Z89511 Acquired absence of right leg below knee: Secondary | ICD-10-CM | POA: Insufficient documentation

## 2015-10-14 DIAGNOSIS — M25511 Pain in right shoulder: Secondary | ICD-10-CM | POA: Insufficient documentation

## 2015-10-14 DIAGNOSIS — M545 Low back pain: Secondary | ICD-10-CM | POA: Insufficient documentation

## 2015-10-14 DIAGNOSIS — F1010 Alcohol abuse, uncomplicated: Secondary | ICD-10-CM | POA: Insufficient documentation

## 2015-10-14 DIAGNOSIS — G546 Phantom limb syndrome with pain: Secondary | ICD-10-CM | POA: Diagnosis not present

## 2015-10-14 DIAGNOSIS — M25512 Pain in left shoulder: Secondary | ICD-10-CM | POA: Insufficient documentation

## 2015-10-14 DIAGNOSIS — M47816 Spondylosis without myelopathy or radiculopathy, lumbar region: Secondary | ICD-10-CM

## 2015-10-14 DIAGNOSIS — G629 Polyneuropathy, unspecified: Secondary | ICD-10-CM | POA: Insufficient documentation

## 2015-10-14 NOTE — Progress Notes (Signed)
  PROCEDURE RECORD  [facility]   Name: Rolla PlateSheldon C Capo DOB:Apr 19, 1961 MRN: [id number]  Date:10/14/2015  Physician: Claudette LawsAndrew Kirsteins, MD    Nurse/CMA: Purvis SheffieldKen Haylea Schlichting  Allergies: No Known Allergies  Consent Signed: Yes.    Is patient diabetic? Yes.    CBG today? ?  Pregnant: No. LMP: No LMP for male patient. (age 54-55)  Anticoagulants: no Anti-inflammatory: no Antibiotics: no  Procedure: left radiofrequency neurotomy  Position: Prone Start Time: 12:57pm  End Time: 1:11pm  Fluoro Time: 31  RN/CMA Teresa CoombsKen Jaeanna Mccomber Ken Yeraldi Fidler    Time 12:45pm 1:17pm    BP 123/71 139/82    Pulse 94 99    Respirations 14 14    O2 Sat 97 97    S/S 6 6    Pain Level 8/10      D/C home with transportation, patient A & O X 3, D/C instructions reviewed, and sits independently.

## 2015-10-14 NOTE — Progress Notes (Signed)
Left L5 dorsal ramus., left L4 and left L3 medial branch radio frequency neurotomy under fluoroscopic guidance  Indication: Low back pain due to lumbar spondylosis which has been relieved on 2 occasions by greater than 50% by lumbar medial branch blocks at corresponding levels.  Informed consent was obtained after describing risks and benefits of the procedure with the patient, this includes bleeding, bruising, infection, paralysis and medication side effects. The patient wishes to proceed and has given written consent. The patient was placed in a prone position. The lumbar and sacral area was marked and prepped with Betadine. A 25-gauge 1-1/2 inch needle was inserted into the skin and subcutaneous tissue at 3 sites in one ML of 1% lidocaine was injected into each site. Then a 20-gauge 10 cm radio frequency needle with a 1 cm curved active tip was inserted targeting the left S1 SAP/sacral ala junction. Bone contact was made and confirmed with lateral imaging. Sensory stimulation at 50 Hz followed by motor stimulation at 2 Hz confirm proper needle location followed by injection of one ML of the solution containing one ML of 4 mg per mL dexamethasone and 3 mL of 1% MPF lidocaine. Then the left L5 SAP/transverse process junction was targeted. Bone contact was made and confirmed with lateral imaging. Sensory stimulation at 50 Hz followed by motor stimulation at 2 Hz confirm proper needle location followed by injection of one ML of the solution containing one ML of 4 mg per mL dexamethasone and 3 mL of 1% MPF lidocaine. Then the left L4 SAP/transverse process junction was targeted. Bone contact was made and confirmed with lateral imaging. Sensory stimulation at 50 Hz followed by motor stimulation at 2 Hz confirm proper needle location followed by injection of one ML of the solution containing one ML of 4 mg per mL dexamethasone and 3 mL of 1% MPF lidocaine. Radio frequency lesion being at 80C for 90 seconds was  performed. Needles were removed. Post procedure instructions and vital signs were performed. Patient tolerated procedure well. Followup appointment was given.      

## 2015-10-14 NOTE — Patient Instructions (Signed)

## 2015-10-21 ENCOUNTER — Ambulatory Visit: Payer: Medicare Other | Admitting: Physician Assistant

## 2015-10-22 ENCOUNTER — Encounter: Payer: Self-pay | Admitting: Physician Assistant

## 2015-10-22 ENCOUNTER — Ambulatory Visit (INDEPENDENT_AMBULATORY_CARE_PROVIDER_SITE_OTHER): Payer: Medicare Other | Admitting: Physician Assistant

## 2015-10-22 ENCOUNTER — Telehealth: Payer: Self-pay | Admitting: Physician Assistant

## 2015-10-22 VITALS — BP 108/50 | HR 116 | Temp 98.5°F | Ht 67.0 in | Wt 208.6 lb

## 2015-10-22 DIAGNOSIS — M545 Low back pain: Secondary | ICD-10-CM

## 2015-10-22 DIAGNOSIS — Z79891 Long term (current) use of opiate analgesic: Secondary | ICD-10-CM | POA: Diagnosis not present

## 2015-10-22 DIAGNOSIS — K7030 Alcoholic cirrhosis of liver without ascites: Secondary | ICD-10-CM | POA: Diagnosis not present

## 2015-10-22 DIAGNOSIS — G8929 Other chronic pain: Secondary | ICD-10-CM | POA: Diagnosis not present

## 2015-10-22 DIAGNOSIS — Z794 Long term (current) use of insulin: Secondary | ICD-10-CM

## 2015-10-22 DIAGNOSIS — E1140 Type 2 diabetes mellitus with diabetic neuropathy, unspecified: Secondary | ICD-10-CM

## 2015-10-22 DIAGNOSIS — Z79899 Other long term (current) drug therapy: Secondary | ICD-10-CM | POA: Diagnosis not present

## 2015-10-22 LAB — URINALYSIS, ROUTINE W REFLEX MICROSCOPIC
Bilirubin Urine: NEGATIVE
LEUKOCYTES UA: NEGATIVE
NITRITE: NEGATIVE
Specific Gravity, Urine: <=1.005 — AB (ref 1.000–1.030)
Total Protein, Urine: NEGATIVE
Urobilinogen, UA: 0.2 (ref 0.0–1.0)
pH: 5.5 (ref 5.0–8.0)

## 2015-10-22 LAB — HEPATIC FUNCTION PANEL
ALK PHOS: 89 U/L (ref 39–117)
ALT: 34 U/L (ref 0–53)
AST: 26 U/L (ref 0–37)
Albumin: 4.3 g/dL (ref 3.5–5.2)
BILIRUBIN TOTAL: 0.6 mg/dL (ref 0.2–1.2)
Bilirubin, Direct: 0.2 mg/dL (ref 0.0–0.3)
Total Protein: 8.8 g/dL — ABNORMAL HIGH (ref 6.0–8.3)

## 2015-10-22 NOTE — Telephone Encounter (Signed)
Caller name: Self  Can be reached: [phone]   Reason for call: Patient called back to stated that his appointment at [facility] is Wednesday October 29, 2015 @ 10:30

## 2015-10-22 NOTE — Progress Notes (Signed)
Pre visit review using our clinic review tool, if applicable. No additional management support is needed unless otherwise documented below in the visit note. 

## 2015-10-22 NOTE — Telephone Encounter (Signed)
Noted  

## 2015-10-22 NOTE — Progress Notes (Signed)
Patient presents to clinic today for follow-up of chronic back pain. Patient has had repeat injection in back with pain management. Endorses no improvement in pain with injection. Is feeling frustrated with his treatment plan since he has yet to see benefit. Has follow-up scheduled in 5 months. Is taking pain medications no more than directed. Tries to only use when he needs. Has completely ceased all alcohol consumption which is fantastic news giving history of Hep C and alcoholic cirrhosis.  Past Medical History  Diagnosis Date  . Diabetes mellitus without complication (HCC)   . Hypertension     Current Outpatient Prescriptions on File Prior to Visit  Medication Sig Dispense Refill  . ALPRAZolam (XANAX) 1 MG tablet TAKE 1 TABLET BY MOUTH AT BEDTIME AS NEEDED 30 tablet 1  . amLODipine (NORVASC) 10 MG tablet Take 1 tablet (10 mg total) by mouth daily. 30 tablet 5  . B-D ULTRAFINE III SHORT PEN 31G X 8 MM MISC USE 1 NEEDLE DAILY OR AS DIRECTED.  1  . carisoprodol (SOMA) 350 MG tablet TAKE 1 TABLET BY MOUTH THREE TIMES DAILY 90 tablet 1  . furosemide (LASIX) 40 MG tablet Take 40 mg by mouth daily.    Marland Kitchen. gabapentin (NEURONTIN) 300 MG capsule TAKE 2 CAPSULES BY MOUTH THREE TIMES DAILY 540 capsule 0  . Insulin Syringe-Needle U-100 (INSULIN SYRINGE 1CC/31GX5/16") 31G X 5/16" 1 ML MISC USE 1 SYRINGE D OR UTD.  0  . LANTUS SOLOSTAR 100 UNIT/ML Solostar Pen Inject 20 Units into the skin at bedtime. Sliding scale  0  . lisinopril (PRINIVIL,ZESTRIL) 10 MG tablet Take 10 mg by mouth daily.    Marland Kitchen. morphine (MS CONTIN) 15 MG 12 hr tablet Take 1 tablet (15 mg total) by mouth every 12 (twelve) hours. 60 tablet 0  . ONE TOUCH ULTRA TEST test strip 2 (two) times daily. for testing  3  . ONETOUCH DELICA LANCETS 33G MISC USE 1 LANCET BID OR AS DIRECTED.  3  . Oxycodone HCl 10 MG TABS Take 1 tablet (10 mg total) by mouth 2 (two) times daily as needed. 60 tablet 0  . simvastatin (ZOCOR) 10 MG tablet Take 10 mg by  mouth at bedtime.    Marland Kitchen. venlafaxine (EFFEXOR) 75 MG tablet Take 1 tablet (75 mg total) by mouth 2 (two) times daily. 180 tablet 1   No current facility-administered medications on file prior to visit.    No Known Allergies  Family History  Problem Relation Age of Onset  . Diabetes Maternal Uncle   . Diabetes Maternal Grandmother     Social History   Social History  . Marital Status: Single    Spouse Name: N/A  . Number of Children: N/A  . Years of Education: N/A   Social History Main Topics  . Smoking status: Former Games developermoker  . Smokeless tobacco: None  . Alcohol Use: Yes  . Drug Use: No  . Sexual Activity: Not Asked   Other Topics Concern  . None   Social History Narrative   Review of Systems - See HPI.  All other ROS are negative.  BP 108/50 mmHg  Pulse 116  Temp(Src) 98.5 F (36.9 C) (Oral)  Ht 5\' 7"  (1.702 m)  Wt 208 lb 9.6 oz (94.62 kg)  BMI 32.66 kg/m2  SpO2 98%  Physical Exam  Constitutional: He is oriented to person, place, and time and well-developed, well-nourished, and in no distress.  HENT:  Head: Normocephalic and atraumatic.  Cardiovascular: Normal rate,  regular rhythm, normal heart sounds and intact distal pulses.   Pulmonary/Chest: Effort normal and breath sounds normal. No respiratory distress. He has no wheezes. He has no rales. He exhibits no tenderness.  Neurological: He is alert and oriented to person, place, and time.  Skin: Skin is warm and dry. No rash noted.  Psychiatric: Affect normal.  Vitals reviewed.   Recent Results (from the past 2160 hour(s))  Hepatic function panel     Status: Abnormal   Collection Time: 07/30/15  2:58 PM  Result Value Ref Range   Total Bilirubin 0.7 0.2 - 1.2 mg/dL   Bilirubin, Direct 0.3 0.0 - 0.3 mg/dL   Alkaline Phosphatase 90 39 - 117 U/L   AST 72 (H) 0 - 37 U/L   ALT 75 (H) 0 - 53 U/L   Total Protein 8.7 (H) 6.0 - 8.3 g/dL   Albumin 4.0 3.5 - 5.2 g/dL  Urinalysis, Routine w reflex microscopic      Status: Abnormal   Collection Time: 10/22/15  1:57 PM  Result Value Ref Range   Color, Urine YELLOW Yellow;Lt. Yellow   APPearance CLEAR Clear   Specific Gravity, Urine <=1.005 (A) 1.000 - 1.030   pH 5.5 5.0 - 8.0   Total Protein, Urine NEGATIVE Negative   Urine Glucose >=1000 (A) Negative   Ketones, ur TRACE (A) Negative   Bilirubin Urine NEGATIVE Negative   Hgb urine dipstick TRACE-INTACT (A) Negative   Urobilinogen, UA 0.2 0.0 - 1.0   Leukocytes, UA NEGATIVE Negative   Nitrite NEGATIVE Negative   RBC / HPF 0-2/hpf 0-2/hpf   Squamous Epithelial / LPF Rare(0-4/hpf) Rare(0-4/hpf)  Hepatic function panel     Status: Abnormal   Collection Time: 10/22/15  1:57 PM  Result Value Ref Range   Total Bilirubin 0.6 0.2 - 1.2 mg/dL   Bilirubin, Direct 0.2 0.0 - 0.3 mg/dL   Alkaline Phosphatase 89 39 - 117 U/L   AST 26 0 - 37 U/L   ALT 34 0 - 53 U/L   Total Protein 8.8 (H) 6.0 - 8.3 g/dL   Albumin 4.3 3.5 - 5.2 g/dL    Assessment/Plan: Alcoholic cirrhosis of liver without ascites Will obtain repeat labs today to assess liver function now that patient has completely ceased alcohol consumption. Follow-up with Hepatology as scheduled.  Diabetes mellitus type II, controlled Will obtain repeat A1C and CMP today.  Continue Lantus as directed. Patient to continue working on diet. Exercise limited due to chronic back issues and s/p BKA RLE.  Chronic low back pain Pain is still present. Is kept under control mostly with medications. Encouraged patient to not get discouraged with injections and to follow-up with Dr. Merceda Elks office. Giving history of chronic liver disease, want to limit Rx pain medication as much as possible. Will check LFT today. Continue pain medications and Gabapentin as directed.

## 2015-10-22 NOTE — Patient Instructions (Signed)
Please call Dr. Bertram GalaKiersteins and let him know the things you mentioned today about worsening pain.  Please go to the lab for blood work and urine testing. I will call with your results.  Call me and let me know how your appointment goes with [facility]

## 2015-10-23 ENCOUNTER — Telehealth: Payer: Self-pay | Admitting: *Deleted

## 2015-10-23 NOTE — Telephone Encounter (Signed)
My recommendation would be for core strengthening exercises.  No interventional procedure for at least 5 more months

## 2015-10-23 NOTE — Telephone Encounter (Signed)
Attempted to reach patient but unable to leave message on VM---it would not accept messages.

## 2015-10-23 NOTE — Telephone Encounter (Signed)
Patient left message stating that initially treatment helped him with  Medication, nerve ablation, steroid injections....Marland Kitchen.he states that the pain has returned full force and is now reliant upon a cane to ambulate and has great concern.  He is asking what else can be done to alleviate his pain....medications?.....treatment?Marland Kitchen...please advise

## 2015-10-24 NOTE — Telephone Encounter (Signed)
Prosthethic is cutting his leg. He says it is hard for him to just "hop up and do anything for exercise".  He does not see [facility] until 10/29/15.  He says he understands what he is being told though.

## 2015-10-26 NOTE — Assessment & Plan Note (Signed)
Will obtain repeat labs today to assess liver function now that patient has completely ceased alcohol consumption. Follow-up with Hepatology as scheduled.

## 2015-10-26 NOTE — Assessment & Plan Note (Signed)
Pain is still present. Is kept under control mostly with medications. Encouraged patient to not get discouraged with injections and to follow-up with Dr. Merceda ElksKierstein office. Giving history of chronic liver disease, want to limit Rx pain medication as much as possible. Will check LFT today. Continue pain medications and Gabapentin as directed.

## 2015-10-26 NOTE — Assessment & Plan Note (Addendum)
Will obtain repeat A1C and CMP today.  Continue Lantus as directed. Patient to continue working on diet. Exercise limited due to chronic back issues and s/p BKA RLE.

## 2015-10-30 ENCOUNTER — Telehealth: Payer: Self-pay | Admitting: Physician Assistant

## 2015-10-30 ENCOUNTER — Other Ambulatory Visit (INDEPENDENT_AMBULATORY_CARE_PROVIDER_SITE_OTHER): Payer: Medicare Other

## 2015-10-30 DIAGNOSIS — Z794 Long term (current) use of insulin: Secondary | ICD-10-CM | POA: Diagnosis not present

## 2015-10-30 DIAGNOSIS — G8929 Other chronic pain: Secondary | ICD-10-CM

## 2015-10-30 DIAGNOSIS — M545 Low back pain: Principal | ICD-10-CM

## 2015-10-30 DIAGNOSIS — E1140 Type 2 diabetes mellitus with diabetic neuropathy, unspecified: Secondary | ICD-10-CM | POA: Diagnosis not present

## 2015-10-30 LAB — BASIC METABOLIC PANEL
BUN: 18 mg/dL (ref 6–23)
CHLORIDE: 92 meq/L — AB (ref 96–112)
CO2: 23 mEq/L (ref 19–32)
Calcium: 10.3 mg/dL (ref 8.4–10.5)
Creatinine, Ser: 1.34 mg/dL (ref 0.40–1.50)
GFR: 71.52 mL/min (ref >60.00)
GLUCOSE: 488 mg/dL — AB (ref 70–99)
POTASSIUM: 3.9 meq/L (ref 3.5–5.1)
SODIUM: 129 meq/L — AB (ref 135–145)

## 2015-10-30 LAB — HEMOGLOBIN A1C: HEMOGLOBIN A1C: 9 % — AB (ref 4.6–6.5)

## 2015-10-30 NOTE — Telephone Encounter (Signed)
Oxycodone Last filled:  10/03/15 Amt: 60,0  Carisoprodol Last filled: 08/27/15 Amt: 90,1  Morphine Last filled:  10/03/15 Amt: 60, 0  Alprazolam Last filled: 09/30/15 Amt: 30, 1 Too soon to refill.  10/22/15 no control substance contract signed, uds sample given  Please advise.

## 2015-10-30 NOTE — Telephone Encounter (Signed)
°  Relation to ZO:XWRUpt:self Call back number:[phone] Pharmacy:  Reason for call: pt is needing rx Oxycodone HCl 10 MG TABS,carisoprodol (SOMA) 350 MG tablet   morphine (MS CONTIN) 15 MG 12 hr tablet        ALPRAZolam (XANAX) 1 MG tablet  Please call pt when available for pick up, pt states he will be out of meds tomorrow

## 2015-10-31 ENCOUNTER — Telehealth: Payer: Self-pay | Admitting: Physician Assistant

## 2015-10-31 ENCOUNTER — Other Ambulatory Visit: Payer: Self-pay | Admitting: Physician Assistant

## 2015-10-31 MED ORDER — LANTUS SOLOSTAR 100 UNIT/ML ~~LOC~~ SOPN: 10.0000 [IU] | PEN_INJECTOR | Freq: Every day | SUBCUTANEOUS | Status: DC

## 2015-10-31 MED ORDER — OXYCODONE HCL 10 MG PO TABS: 10.0000 mg | ORAL_TABLET | Freq: Two times a day (BID) | ORAL | Status: DC | PRN

## 2015-10-31 MED ORDER — MORPHINE SULFATE ER 15 MG PO TBCR: 15.0000 mg | EXTENDED_RELEASE_TABLET | Freq: Two times a day (BID) | ORAL | Status: DC

## 2015-10-31 MED ORDER — CARISOPRODOL 350 MG PO TABS: 350.0000 mg | ORAL_TABLET | Freq: Three times a day (TID) | ORAL | Status: DC

## 2015-10-31 NOTE — Telephone Encounter (Signed)
No refill on Xanax -- too early.  I have printed refills of the others for him to pick up. Will get CSC signed at his follow-up.  Will also continued decreasing pain meds at next visit since he is seeing pain management for injections.

## 2015-10-31 NOTE — Telephone Encounter (Signed)
Insulin sent into pharmacy

## 2015-10-31 NOTE — Telephone Encounter (Signed)
Pt notified and made aware.  He stated understanding and also asked for a refill on his simvastatin.    Simvastatin has not been refilled by our office before.  Please advise.

## 2015-11-02 ENCOUNTER — Other Ambulatory Visit: Payer: Self-pay | Admitting: Physician Assistant

## 2015-11-03 ENCOUNTER — Telehealth: Payer: Self-pay | Admitting: Physician Assistant

## 2015-11-03 ENCOUNTER — Encounter: Payer: Self-pay | Admitting: Physician Assistant

## 2015-11-03 ENCOUNTER — Ambulatory Visit (INDEPENDENT_AMBULATORY_CARE_PROVIDER_SITE_OTHER): Payer: Medicare Other | Admitting: Physician Assistant

## 2015-11-03 VITALS — BP 108/58 | HR 104 | Temp 97.9°F | Wt 204.2 lb

## 2015-11-03 DIAGNOSIS — Z794 Long term (current) use of insulin: Secondary | ICD-10-CM

## 2015-11-03 DIAGNOSIS — R7989 Other specified abnormal findings of blood chemistry: Secondary | ICD-10-CM

## 2015-11-03 DIAGNOSIS — E1140 Type 2 diabetes mellitus with diabetic neuropathy, unspecified: Secondary | ICD-10-CM | POA: Diagnosis not present

## 2015-11-03 LAB — GLUCOSE, POCT (MANUAL RESULT ENTRY)
POC GLUCOSE: 513 mg/dL — AB (ref 70–99)
POC Glucose: 490 mg/dl — AB (ref 70–99)

## 2015-11-03 MED ORDER — INSULIN LISPRO 200 UNIT/ML ~~LOC~~ SOPN
10.0000 [IU] | PEN_INJECTOR | Freq: Once | SUBCUTANEOUS | Status: AC
  Administered 2015-11-03: 10 [IU] via SUBCUTANEOUS

## 2015-11-03 NOTE — Assessment & Plan Note (Signed)
Will decrease his Lasix to 20 mg. Continue Propel. Repeat BMP today.

## 2015-11-03 NOTE — Patient Instructions (Addendum)
Please stay hydrated. Drink a propel water daily. Cut the lasix in half -- only take 1/2 tablet daily.  Continue morning and noon dose of Gabapentin. Increase nightime dose to 900 mg.   For glucose levels -- Take 16 units of Lantus tonight. Make sure to eat dinner. Increase Lantus by 2 units each night x 2 nights if fasting sugar is > 200 that morning.   Follow-up in 2 days.

## 2015-11-03 NOTE — Progress Notes (Signed)
Patient presents to clinic today for follow-up of mild hyponatremia noted on recent labs as well as significant increase in A1C to 9.0.   Patient is currently on Lasix 40 mg given by another provider. Endorses taking as directed. Denies history of low-sodium. Has been trying to drink Propel as directed to increase electrolytes.   Patient endorses restarting insulin as directed. Denies polyuria, urinary urgency or frequency. Has been getting multiple injections through Pain Management. Is also being treated for Hepatocellular Carcinoma by GI.   Past Medical History  Diagnosis Date  . Diabetes mellitus without complication (HCC)   . Hypertension     Current Outpatient Prescriptions on File Prior to Visit  Medication Sig Dispense Refill  . ALPRAZolam (XANAX) 1 MG tablet TAKE 1 TABLET BY MOUTH AT BEDTIME AS NEEDED 30 tablet 1  . amLODipine (NORVASC) 10 MG tablet Take 1 tablet (10 mg total) by mouth daily. 30 tablet 5  . B-D ULTRAFINE III SHORT PEN 31G X 8 MM MISC USE 1 NEEDLE DAILY OR AS DIRECTED.  1  . carisoprodol (SOMA) 350 MG tablet Take 1 tablet (350 mg total) by mouth 3 (three) times daily. 90 tablet 1  . furosemide (LASIX) 40 MG tablet Take 40 mg by mouth daily.    Marland Kitchen. gabapentin (NEURONTIN) 300 MG capsule TAKE 2 CAPSULES BY MOUTH THREE TIMES DAILY 540 capsule 0  . Insulin Syringe-Needle U-100 (INSULIN SYRINGE 1CC/31GX5/16") 31G X 5/16" 1 ML MISC USE 1 SYRINGE D OR UTD.  0  . LANTUS SOLOSTAR 100 UNIT/ML Solostar Pen Inject 10 Units into the skin at bedtime. Sliding scale 15 mL 1  . lisinopril (PRINIVIL,ZESTRIL) 10 MG tablet Take 10 mg by mouth daily.    Marland Kitchen. morphine (MS CONTIN) 15 MG 12 hr tablet Take 1 tablet (15 mg total) by mouth every 12 (twelve) hours. 60 tablet 0  . ONE TOUCH ULTRA TEST test strip 2 (two) times daily. for testing  3  . ONETOUCH DELICA LANCETS 33G MISC USE 1 LANCET BID OR AS DIRECTED.  3  . Oxycodone HCl 10 MG TABS Take 1 tablet (10 mg total) by mouth 2 (two) times  daily as needed. 60 tablet 0  . simvastatin (ZOCOR) 10 MG tablet Take 10 mg by mouth at bedtime.    Marland Kitchen. venlafaxine (EFFEXOR) 75 MG tablet Take 1 tablet (75 mg total) by mouth 2 (two) times daily. 180 tablet 1   No current facility-administered medications on file prior to visit.    No Known Allergies  Family History  Problem Relation Age of Onset  . Diabetes Maternal Uncle   . Diabetes Maternal Grandmother     Social History   Social History  . Marital Status: Single    Spouse Name: N/A  . Number of Children: N/A  . Years of Education: N/A   Social History Main Topics  . Smoking status: Former Games developermoker  . Smokeless tobacco: None  . Alcohol Use: Yes  . Drug Use: No  . Sexual Activity: Not Asked   Other Topics Concern  . None   Social History Narrative    Review of Systems - See HPI.  All other ROS are negative.  BP 108/58 mmHg  Pulse 104  Temp(Src) 97.9 F (36.6 C) (Oral)  Ht   Wt 204 lb 3.2 oz (92.625 kg)  SpO2 98%  Physical Exam  Constitutional: He is well-developed, well-nourished, and in no distress.  HENT:  Head: Normocephalic and atraumatic.  Eyes: Conjunctivae are normal.  Cardiovascular: Normal rate, regular rhythm, normal heart sounds and intact distal pulses.   Pulmonary/Chest: Effort normal and breath sounds normal. No respiratory distress. He has no wheezes. He has no rales. He exhibits no tenderness.  Neurological: He is alert.  Skin: Skin is warm and dry. No rash noted.  Psychiatric: Affect normal.  Vitals reviewed.   Recent Results (from the past 2160 hour(s))  Urinalysis, Routine w reflex microscopic     Status: Abnormal   Collection Time: 10/22/15  1:57 PM  Result Value Ref Range   Color, Urine YELLOW Yellow;Lt. Yellow   APPearance CLEAR Clear   Specific Gravity, Urine <=1.005 (A) 1.000 - 1.030   pH 5.5 5.0 - 8.0   Total Protein, Urine NEGATIVE Negative   Urine Glucose >=1000 (A) Negative   Ketones, ur TRACE (A) Negative   Bilirubin  Urine NEGATIVE Negative   Hgb urine dipstick TRACE-INTACT (A) Negative   Urobilinogen, UA 0.2 0.0 - 1.0   Leukocytes, UA NEGATIVE Negative   Nitrite NEGATIVE Negative   RBC / HPF 0-2/hpf 0-2/hpf   Squamous Epithelial / LPF Rare(0-4/hpf) Rare(0-4/hpf)  Hepatic function panel     Status: Abnormal   Collection Time: 10/22/15  1:57 PM  Result Value Ref Range   Total Bilirubin 0.6 0.2 - 1.2 mg/dL   Bilirubin, Direct 0.2 0.0 - 0.3 mg/dL   Alkaline Phosphatase 89 39 - 117 U/L   AST 26 0 - 37 U/L   ALT 34 0 - 53 U/L   Total Protein 8.8 (H) 6.0 - 8.3 g/dL   Albumin 4.3 3.5 - 5.2 g/dL  Basic Metabolic Panel (BMET)     Status: Abnormal   Collection Time: 10/30/15 10:40 AM  Result Value Ref Range   Sodium 129 (L) 135 - 145 mEq/L   Potassium 3.9 3.5 - 5.1 mEq/L   Chloride 92 (L) 96 - 112 mEq/L   CO2 23 19 - 32 mEq/L   Glucose, Bld 488 (H) 70 - 99 mg/dL   BUN 18 6 - 23 mg/dL   Creatinine, Ser 1.61 0.40 - 1.50 mg/dL   Calcium 09.6 8.4 - 04.5 mg/dL   GFR 40.98 >11.91 mL/min  HgB A1c     Status: Abnormal   Collection Time: 10/30/15 10:40 AM  Result Value Ref Range   Hgb A1c MFr Bld 9.0 (H) 4.6 - 6.5 %    Comment: Glycemic Control Guidelines for People with Diabetes:Non Diabetic:  <6%Goal of Therapy: <7%Additional Action Suggested:  >8%   POC Glucose (CBG)     Status: Abnormal   Collection Time: 11/03/15  4:46 PM  Result Value Ref Range   POC Glucose 513 (A) 70 - 99 mg/dl  POC Glucose (CBG)     Status: Abnormal   Collection Time: 11/03/15  5:00 PM  Result Value Ref Range   POC Glucose 490 (A) 70 - 99 mg/dl    Comment: Second CBG after 10 units of Humalog KwikPen 200 units per ml.    Assessment/Plan: Diabetes mellitus type II, controlled Recently has become uncontrolled. Non-fasting CBG 510 in office. Asymptomatic and no distress. Humalog 10 units given in office. Decreased to <490 within 30 minutes. Discussed need to get this under control ASAP. Since stable and sugars to continue  lowering over the next 60 minutes from Humalog. Will increase his Lantus to 15 units tonight. He is to take once he is home. Is to check fasting sugar and a PM sugar each day. Increase Lantus 2 units per night  for next 2 nights until FU Wednesday. If sugars are 500 + this evening despite medication he is to proceed to ER. Will call patient tomorrow to assess fasting sugars.   Low serum sodium Will decrease his Lasix to 20 mg. Continue Propel. Repeat BMP today.

## 2015-11-03 NOTE — Telephone Encounter (Signed)
Will discuss at his visit today.

## 2015-11-03 NOTE — Progress Notes (Signed)
Pre visit review using our clinic review tool, if applicable. No additional management support is needed unless otherwise documented below in the visit note. 

## 2015-11-03 NOTE — Assessment & Plan Note (Signed)
Recently has become uncontrolled. Non-fasting CBG 510 in office. Asymptomatic and no distress. Humalog 10 units given in office. Decreased to <490 within 30 minutes. Discussed need to get this under control ASAP. Since stable and sugars to continue lowering over the next 60 minutes from Humalog. Will increase his Lantus to 15 units tonight. He is to take once he is home. Is to check fasting sugar and a PM sugar each day. Increase Lantus 2 units per night for next 2 nights until FU Wednesday. If sugars are 500 + this evening despite medication he is to proceed to ER. Will call patient tomorrow to assess fasting sugars.

## 2015-11-03 NOTE — Telephone Encounter (Signed)
Please call patient this AM to assess his fasting sugars and to see how he is feeling.

## 2015-11-04 ENCOUNTER — Encounter (HOSPITAL_BASED_OUTPATIENT_CLINIC_OR_DEPARTMENT_OTHER): Payer: Self-pay | Admitting: *Deleted

## 2015-11-04 ENCOUNTER — Telehealth: Payer: Self-pay | Admitting: *Deleted

## 2015-11-04 ENCOUNTER — Ambulatory Visit (INDEPENDENT_AMBULATORY_CARE_PROVIDER_SITE_OTHER): Payer: Medicare Other | Admitting: *Deleted

## 2015-11-04 ENCOUNTER — Emergency Department (HOSPITAL_BASED_OUTPATIENT_CLINIC_OR_DEPARTMENT_OTHER)
Admission: EM | Admit: 2015-11-04 | Discharge: 2015-11-04 | Disposition: A | Discharge status: Home / Self Care | Payer: Medicare Other | Attending: Emergency Medicine | Admitting: Emergency Medicine

## 2015-11-04 DIAGNOSIS — R739 Hyperglycemia, unspecified: Secondary | ICD-10-CM

## 2015-11-04 DIAGNOSIS — Z89511 Acquired absence of right leg below knee: Secondary | ICD-10-CM | POA: Diagnosis not present

## 2015-11-04 DIAGNOSIS — E1165 Type 2 diabetes mellitus with hyperglycemia: Secondary | ICD-10-CM | POA: Insufficient documentation

## 2015-11-04 DIAGNOSIS — Z79899 Other long term (current) drug therapy: Secondary | ICD-10-CM | POA: Insufficient documentation

## 2015-11-04 DIAGNOSIS — Z794 Long term (current) use of insulin: Secondary | ICD-10-CM | POA: Diagnosis not present

## 2015-11-04 DIAGNOSIS — Z87891 Personal history of nicotine dependence: Secondary | ICD-10-CM | POA: Diagnosis not present

## 2015-11-04 DIAGNOSIS — I10 Essential (primary) hypertension: Secondary | ICD-10-CM | POA: Insufficient documentation

## 2015-11-04 LAB — URINALYSIS, ROUTINE W REFLEX MICROSCOPIC
BILIRUBIN URINE: NEGATIVE
BILIRUBIN URINE: NEGATIVE
Glucose, UA: >1000 mg/dL — AB
Hgb urine dipstick: NEGATIVE
Ketones, ur: NEGATIVE mg/dL
LEUKOCYTES UA: NEGATIVE
Leukocytes, UA: NEGATIVE
NITRITE: NEGATIVE
NITRITE: NEGATIVE
PH: 5.5 (ref 5.0–8.0)
Protein, ur: NEGATIVE mg/dL
Specific Gravity, Urine: <=1.005 — AB (ref 1.000–1.030)
Specific Gravity, Urine: 1.025 (ref 1.005–1.030)
Total Protein, Urine: NEGATIVE
UROBILINOGEN UA: 0.2 (ref 0.0–1.0)
pH: 5.5 (ref 5.0–8.0)

## 2015-11-04 LAB — COMPREHENSIVE METABOLIC PANEL
ALT: 49 U/L (ref 17–63)
ANION GAP: 11 (ref 5–15)
AST: 42 U/L — ABNORMAL HIGH (ref 15–41)
Albumin: 4 g/dL (ref 3.5–5.0)
Alkaline Phosphatase: 107 U/L (ref 38–126)
BUN: 30 mg/dL — ABNORMAL HIGH (ref 6–20)
CHLORIDE: 90 mmol/L — AB (ref 101–111)
CO2: 22 mmol/L (ref 22–32)
Calcium: 9.5 mg/dL (ref 8.9–10.3)
Creatinine, Ser: 1.72 mg/dL — ABNORMAL HIGH (ref 0.61–1.24)
GFR calc non Af Amer: 44 mL/min — ABNORMAL LOW (ref >60)
GFR, EST AFRICAN AMERICAN: 51 mL/min — AB (ref >60)
Glucose, Bld: 668 mg/dL (ref 65–99)
POTASSIUM: 4.1 mmol/L (ref 3.5–5.1)
SODIUM: 123 mmol/L — AB (ref 135–145)
Total Bilirubin: 0.9 mg/dL (ref 0.3–1.2)
Total Protein: 8.4 g/dL — ABNORMAL HIGH (ref 6.5–8.1)

## 2015-11-04 LAB — CBC WITH DIFFERENTIAL/PLATELET
Basophils Absolute: 0 10*3/uL (ref 0.0–0.1)
Basophils Relative: 0 %
EOS ABS: 0 10*3/uL (ref 0.0–0.7)
EOS PCT: 0 %
HCT: 35.7 % — ABNORMAL LOW (ref 39.0–52.0)
Hemoglobin: 11.8 g/dL — ABNORMAL LOW (ref 13.0–17.0)
LYMPHS ABS: 0.7 10*3/uL (ref 0.7–4.0)
Lymphocytes Relative: 5 %
MCH: 27.4 pg (ref 26.0–34.0)
MCHC: 33.1 g/dL (ref 30.0–36.0)
MCV: 82.8 fL (ref 78.0–100.0)
MONOS PCT: 7 %
Monocytes Absolute: 1.1 10*3/uL — ABNORMAL HIGH (ref 0.1–1.0)
Neutro Abs: 12.4 10*3/uL — ABNORMAL HIGH (ref 1.7–7.7)
Neutrophils Relative %: 88 %
PLATELETS: 124 10*3/uL — AB (ref 150–400)
RBC: 4.31 MIL/uL (ref 4.22–5.81)
RDW: 14.4 % (ref 11.5–15.5)
WBC: 14.2 10*3/uL — AB (ref 4.0–10.5)

## 2015-11-04 LAB — I-STAT VENOUS BLOOD GAS, ED
Acid-base deficit: 3 mmol/L — ABNORMAL HIGH (ref 0.0–2.0)
Bicarbonate: 22.9 meq/L (ref 20.0–24.0)
O2 Saturation: 75 %
Patient temperature: 98.5
TCO2: 24 mmol/L (ref 0–100)
pCO2, Ven: 41.5 mmHg — ABNORMAL LOW (ref 45.0–50.0)
pH, Ven: 7.348 — ABNORMAL HIGH (ref 7.250–7.300)
pO2, Ven: 42 mmHg (ref 31.0–45.0)

## 2015-11-04 LAB — URINE MICROSCOPIC-ADD ON: SQUAMOUS EPITHELIAL / LPF: NONE SEEN

## 2015-11-04 LAB — BASIC METABOLIC PANEL
BUN: 27 mg/dL — ABNORMAL HIGH (ref 6–23)
CALCIUM: 10 mg/dL (ref 8.4–10.5)
CO2: 22 meq/L (ref 19–32)
Chloride: 88 mEq/L — ABNORMAL LOW (ref 96–112)
Creatinine, Ser: 1.55 mg/dL — ABNORMAL HIGH (ref 0.40–1.50)
GFR: 60.46 mL/min (ref >60.00)
Glucose, Bld: 608 mg/dL (ref 70–99)
Potassium: 4.2 mEq/L (ref 3.5–5.1)
SODIUM: 122 meq/L — AB (ref 135–145)

## 2015-11-04 LAB — POCT GLUCOSE (DEVICE FOR HOME USE)

## 2015-11-04 LAB — CBG MONITORING, ED
Glucose-Capillary: >600 mg/dL (ref 65–99)
Glucose-Capillary: 573 mg/dL (ref 65–99)
Glucose-Capillary: 437 mg/dL — ABNORMAL HIGH (ref 65–99)
Glucose-Capillary: 382 mg/dL — ABNORMAL HIGH (ref 65–99)

## 2015-11-04 MED ORDER — GABAPENTIN 600 MG PO TABS
300.0000 mg | ORAL_TABLET | Freq: Once | ORAL | Status: AC
  Administered 2015-11-04: 300 mg via ORAL
  Filled 2015-11-04: qty 1

## 2015-11-04 MED ORDER — ONETOUCH DELICA LANCETS 33G MISC: Status: DC

## 2015-11-04 MED ORDER — MORPHINE SULFATE (PF) 4 MG/ML IV SOLN
4.0000 mg | Freq: Once | INTRAVENOUS | Status: AC
  Administered 2015-11-04: 4 mg via INTRAVENOUS
  Filled 2015-11-04: qty 1

## 2015-11-04 MED ORDER — SODIUM CHLORIDE 0.9 % IV BOLUS (SEPSIS)
500.0000 mL | Freq: Once | INTRAVENOUS | Status: AC
  Administered 2015-11-04: 500 mL via INTRAVENOUS

## 2015-11-04 MED ORDER — INSULIN GLARGINE 100 UNIT/ML ~~LOC~~ SOLN
10.0000 [IU] | Freq: Once | SUBCUTANEOUS | Status: AC
  Administered 2015-11-04: 10 [IU] via SUBCUTANEOUS
  Filled 2015-11-04: qty 10

## 2015-11-04 MED ORDER — SODIUM CHLORIDE 0.9 % IV BOLUS (SEPSIS)
1000.0000 mL | Freq: Once | INTRAVENOUS | Status: AC
  Administered 2015-11-04: 1000 mL via INTRAVENOUS

## 2015-11-04 NOTE — Telephone Encounter (Signed)
error:[id number] ° °

## 2015-11-04 NOTE — Telephone Encounter (Signed)
Pt states he has been unable to check CBG because both of his glucometers are broken. Pt requests new supplies. He took 16 units Lantus last night as directed and states he feels fine. Please advise.

## 2015-11-04 NOTE — ED Notes (Signed)
CBG read over 600.

## 2015-11-04 NOTE — Discharge Instructions (Signed)
You were seen and evaluated today for your high blood sugar. At this time your blood sugar was very high but you do not have any acid in your blood at this time. You need to follow closely with your primary care physician to get your blood sugar better under control. Take your insulin as advised and directed by their office. Return if you become ill or start to feel sick.  Hyperglycemia Hyperglycemia occurs when the glucose (sugar) in your blood is too high. Hyperglycemia can happen for many reasons, but it most often happens to people who do not know they have diabetes or are not managing their diabetes properly.  CAUSES  Whether you have diabetes or not, there are other causes of hyperglycemia. Hyperglycemia can occur when you have diabetes, but it can also occur in other situations that you might not be as aware of, such as: Diabetes  If you have diabetes and are having problems controlling your blood glucose, hyperglycemia could occur because of some of the following reasons:  Not following your meal plan.  Not taking your diabetes medications or not taking it properly.  Exercising less or doing less activity than you normally do.  Being sick. Pre-diabetes  This cannot be ignored. Before people develop Type 2 diabetes, they almost always have "pre-diabetes." This is when your blood glucose levels are higher than normal, but not yet high enough to be diagnosed as diabetes. Research has shown that some long-term damage to the body, especially the heart and circulatory system, may already be occurring during pre-diabetes. If you take action to manage your blood glucose when you have pre-diabetes, you may delay or prevent Type 2 diabetes from developing. Stress  If you have diabetes, you may be "diet" controlled or on oral medications or insulin to control your diabetes. However, you may find that your blood glucose is higher than usual in the hospital whether you have diabetes or not. This is  often referred to as "stress hyperglycemia." Stress can elevate your blood glucose. This happens because of hormones put out by the body during times of stress. If stress has been the cause of your high blood glucose, it can be followed regularly by your caregiver. That way he/she can make sure your hyperglycemia does not continue to get worse or progress to diabetes. Steroids  Steroids are medications that act on the infection fighting system (immune system) to block inflammation or infection. One side effect can be a rise in blood glucose. Most people can produce enough extra insulin to allow for this rise, but for those who cannot, steroids make blood glucose levels go even higher. It is not unusual for steroid treatments to "uncover" diabetes that is developing. It is not always possible to determine if the hyperglycemia will go away after the steroids are stopped. A special blood test called an A1c is sometimes done to determine if your blood glucose was elevated before the steroids were started. SYMPTOMS  Thirsty.  Frequent urination.  Dry mouth.  Blurred vision.  Tired or fatigue.  Weakness.  Sleepy.  Tingling in feet or leg. DIAGNOSIS  Diagnosis is made by monitoring blood glucose in one or all of the following ways:  A1c test. This is a chemical found in your blood.  Fingerstick blood glucose monitoring.  Laboratory results. TREATMENT  First, knowing the cause of the hyperglycemia is important before the hyperglycemia can be treated. Treatment may include, but is not be limited to:  Education.  Change or adjustment  in medications.  Change or adjustment in meal plan.  Treatment for an illness, infection, etc.  More frequent blood glucose monitoring.  Change in exercise plan.  Decreasing or stopping steroids.  Lifestyle changes. HOME CARE INSTRUCTIONS   Test your blood glucose as directed.  Exercise regularly. Your caregiver will give you instructions about  exercise. Pre-diabetes or diabetes which comes on with stress is helped by exercising.  Eat wholesome, balanced meals. Eat often and at regular, fixed times. Your caregiver or nutritionist will give you a meal plan to guide your sugar intake.  Being at an ideal weight is important. If needed, losing as little as 10 to 15 pounds may help improve blood glucose levels. SEEK MEDICAL CARE IF:   You have questions about medicine, activity, or diet.  You continue to have symptoms (problems such as increased thirst, urination, or weight gain). SEEK IMMEDIATE MEDICAL CARE IF:   You are vomiting or have diarrhea.  Your breath smells fruity.  You are breathing faster or slower.  You are very sleepy or incoherent.  You have numbness, tingling, or pain in your feet or hands.  You have chest pain.  Your symptoms get worse even though you have been following your caregiver's orders.  If you have any other questions or concerns.   This information is not intended to replace advice given to you by your health care provider. Make sure you discuss any questions you have with your health care provider.   Document Released: 01/05/2001 Document Revised: 10/04/2011 Document Reviewed: 03/18/2015 Elsevier Interactive Patient Education 2016 [employer]   Diabetes Mellitus and Food It is important for you to manage your blood sugar (glucose) level. Your blood glucose level can be greatly affected by what you eat. Eating healthier foods in the appropriate amounts throughout the day at about the same time each day will help you control your blood glucose level. It can also help slow or prevent worsening of your diabetes mellitus. Healthy eating may even help you improve the level of your blood pressure and reach or maintain a healthy weight.  General recommendations for healthful eating and cooking habits include:  Eating meals and snacks regularly. Avoid going long periods of time without eating to lose  weight.  Eating a diet that consists mainly of plant-based foods, such as fruits, vegetables, nuts, legumes, and whole grains.  Using low-heat cooking methods, such as baking, instead of high-heat cooking methods, such as deep frying. Work with your dietitian to make sure you understand how to use the Nutrition Facts information on food labels. HOW CAN FOOD AFFECT ME? Carbohydrates Carbohydrates affect your blood glucose level more than any other type of food. Your dietitian will help you determine how many carbohydrates to eat at each meal and teach you how to count carbohydrates. Counting carbohydrates is important to keep your blood glucose at a healthy level, especially if you are using insulin or taking certain medicines for diabetes mellitus. Alcohol Alcohol can cause sudden decreases in blood glucose (hypoglycemia), especially if you use insulin or take certain medicines for diabetes mellitus. Hypoglycemia can be a life-threatening condition. Symptoms of hypoglycemia (sleepiness, dizziness, and disorientation) are similar to symptoms of having too much alcohol.  If your health care provider has given you approval to drink alcohol, do so in moderation and use the following guidelines:  Women should not have more than one drink per day, and men should not have more than two drinks per day. One drink is equal to:  12 oz of beer.  5 oz of wine.  1 oz of hard liquor.  Do not drink on an empty stomach.  Keep yourself hydrated. Have water, diet soda, or unsweetened iced tea.  Regular soda, juice, and other mixers might contain a lot of carbohydrates and should be counted. WHAT FOODS ARE NOT RECOMMENDED? As you make food choices, it is important to remember that all foods are not the same. Some foods have fewer nutrients per serving than other foods, even though they might have the same number of calories or carbohydrates. It is difficult to get your body what it needs when you eat foods  with fewer nutrients. Examples of foods that you should avoid that are high in calories and carbohydrates but low in nutrients include:  Trans fats (most processed foods list trans fats on the Nutrition Facts label).  Regular soda.  Juice.  Candy.  Sweets, such as cake, pie, doughnuts, and cookies.  Fried foods. WHAT FOODS CAN I EAT? Eat nutrient-rich foods, which will nourish your body and keep you healthy. The food you should eat also will depend on several factors, including:  The calories you need.  The medicines you take.  Your weight.  Your blood glucose level.  Your blood pressure level.  Your cholesterol level. You should eat a variety of foods, including:  Protein.  Lean cuts of meat.  Proteins low in saturated fats, such as fish, egg whites, and beans. Avoid processed meats.  Fruits and vegetables.  Fruits and vegetables that may help control blood glucose levels, such as apples, mangoes, and yams.  Dairy products.  Choose fat-free or low-fat dairy products, such as milk, yogurt, and cheese.  Grains, bread, pasta, and rice.  Choose whole grain products, such as multigrain bread, whole oats, and brown rice. These foods may help control blood pressure.  Fats.  Foods containing healthful fats, such as nuts, avocado, olive oil, canola oil, and fish. DOES EVERYONE WITH DIABETES MELLITUS HAVE THE SAME MEAL PLAN? Because every person with diabetes mellitus is different, there is not one meal plan that works for everyone. It is very important that you meet with a dietitian who will help you create a meal plan that is just right for you.   This information is not intended to replace advice given to you by your health care provider. Make sure you discuss any questions you have with your health care provider.   Document Released: 04/08/2005 Document Revised: 08/02/2014 Document Reviewed: 06/08/2013 Elsevier Interactive Patient Education [employer]

## 2015-11-04 NOTE — Telephone Encounter (Signed)
Have him come in to pick up one touch meter and strips. Ok to send in lancets so he can check sugars as directed. If he is willing -- add him on to nurse visit schedule for a POC CBG.

## 2015-11-04 NOTE — Telephone Encounter (Signed)
Pt currently in the ER

## 2015-11-04 NOTE — Telephone Encounter (Signed)
[facility] reporting critical-- GLU @ 608             Sodium @ 121.7

## 2015-11-04 NOTE — Telephone Encounter (Signed)
Patient was here for nurse visit and glucose noted above 600. Spoke with ER MD. Patient send to ER.

## 2015-11-04 NOTE — ED Provider Notes (Signed)
CSN: [id number]     Arrival date & time 11/04/15  1126 History   First MD Initiated Contact with Patient 11/04/15 1141     Chief Complaint  Patient presents with  . Hyperglycemia     (Consider location/radiation/quality/duration/timing/severity/associated sxs/prior Treatment) HPI Comments: 55 year old male with history of insulin-dependent diabetes, hypertension presents for hyperglycemia. The patient noted this morning that his glucometer was not working and so he went to his primary care physician's office to get a new machine. While he was there they noted that his blood sugar was over 600. The patient's blood sugar has been poorly controlled and there was some concern that he was not using his medications as prescribed. The patient reports feeling well and denies any complaints. His PCPs office has recently increased his insulin to try to control his blood sugar better but when I was over 600 today they sent him down here for labs and treatment.   Past Medical History  Diagnosis Date  . Diabetes mellitus without complication (HCC)   . Hypertension    History reviewed. No pertinent past surgical history. Family History  Problem Relation Age of Onset  . Diabetes Maternal Uncle   . Diabetes Maternal Grandmother    Social History  Substance Use Topics  . Smoking status: Former Games developer  . Smokeless tobacco: None  . Alcohol Use: Yes    Review of Systems  Constitutional: Negative for fever, chills and fatigue.  HENT: Negative for congestion, sinus pressure and sneezing.   Eyes: Negative for pain and visual disturbance.  Cardiovascular: Negative for chest pain and palpitations.  Gastrointestinal: Negative for nausea, vomiting, abdominal pain, diarrhea and constipation.  Endocrine: Negative for polydipsia and polyuria.  Genitourinary: Negative for dysuria, urgency and hematuria.  Musculoskeletal: Negative for myalgias and back pain.  Skin: Negative for rash.  Neurological: Negative  for dizziness, weakness, light-headedness and headaches.  Hematological: Does not bruise/bleed easily.      Allergies  Review of patient's allergies indicates no known allergies.  Home Medications   Prior to Admission medications   Medication Sig Start Date End Date Taking? Authorizing Provider  ALPRAZolam Prudy Feeler) 1 MG tablet TAKE 1 TABLET BY MOUTH AT BEDTIME AS NEEDED 09/30/15   Waldon Merl, PA-C  amLODipine (NORVASC) 10 MG tablet Take 1 tablet (10 mg total) by mouth daily. 04/29/15   Waldon Merl, PA-C  B-D ULTRAFINE III SHORT PEN 31G X 8 MM MISC USE 1 NEEDLE DAILY OR AS DIRECTED. 11/25/14   Historical Provider, MD  carisoprodol (SOMA) 350 MG tablet Take 1 tablet (350 mg total) by mouth 3 (three) times daily. 10/31/15   Waldon Merl, PA-C  furosemide (LASIX) 40 MG tablet Take 40 mg by mouth daily.    Historical Provider, MD  gabapentin (NEURONTIN) 300 MG capsule TAKE 2 CAPSULES BY MOUTH THREE TIMES DAILY 09/19/15   Waldon Merl, PA-C  Insulin Syringe-Needle U-100 (INSULIN SYRINGE 1CC/31GX5/16") 31G X 5/16" 1 ML MISC USE 1 SYRINGE D OR UTD. 12/05/14   Historical Provider, MD  LANTUS SOLOSTAR 100 UNIT/ML Solostar Pen Inject 10 Units into the skin at bedtime. Sliding scale 10/31/15   Waldon Merl, PA-C  lisinopril (PRINIVIL,ZESTRIL) 10 MG tablet Take 10 mg by mouth daily.    Historical Provider, MD  morphine (MS CONTIN) 15 MG 12 hr tablet Take 1 tablet (15 mg total) by mouth every 12 (twelve) hours. 10/31/15   Waldon Merl, PA-C  ONE TOUCH ULTRA TEST test strip 2 (two) times  daily. for testing 11/08/14   Historical Provider, MD  [facility] LANCETS 33G MISC Use to check blood sugar twice daily. 11/04/15   Waldon MerlWilliam C Martin, PA-C  Oxycodone HCl 10 MG TABS Take 1 tablet (10 mg total) by mouth 2 (two) times daily as needed. 10/31/15   Waldon MerlWilliam C Martin, PA-C  simvastatin (ZOCOR) 10 MG tablet Take 10 mg by mouth at bedtime.    Historical Provider, MD  venlafaxine (EFFEXOR) 75 MG  tablet Take 1 tablet (75 mg total) by mouth 2 (two) times daily. 03/25/15   Waldon MerlWilliam C Martin, PA-C   BP 147/89 mmHg  Pulse 93  Temp(Src) 98.5 F (36.9 C)  Resp 20  SpO2 93% Physical Exam  Constitutional: He is oriented to person, place, and time. He appears well-developed and well-nourished. No distress.  HENT:  Head: Normocephalic and atraumatic.  Right Ear: External ear normal.  Left Ear: External ear normal.  Mouth/Throat: Oropharynx is clear and moist. No oropharyngeal exudate.  Eyes: EOM are normal. Pupils are equal, round, and reactive to light.  Neck: Normal range of motion. Neck supple.  Cardiovascular: Normal rate, regular rhythm, normal heart sounds and intact distal pulses.   Pulmonary/Chest: Effort normal. No respiratory distress. He has no wheezes. He has no rales.  Abdominal: Soft. He exhibits no distension. There is no tenderness.  Musculoskeletal: He exhibits no edema or tenderness.  Patient with a right leg amputation  Neurological: He is alert and oriented to person, place, and time.  Skin: Skin is warm and dry. No rash noted. He is not diaphoretic.  Vitals reviewed.   ED Course  Procedures (including critical care time) Labs Review Labs Reviewed  URINALYSIS, ROUTINE W REFLEX MICROSCOPIC (NOT AT [facility]) - Abnormal; Notable for the following:    Glucose, UA >1000 (*)    All other components within normal limits  CBC WITH DIFFERENTIAL/PLATELET - Abnormal; Notable for the following:    WBC 14.2 (*)    Hemoglobin 11.8 (*)    HCT 35.7 (*)    Platelets 124 (*)    Neutro Abs 12.4 (*)    Monocytes Absolute 1.1 (*)    All other components within normal limits  COMPREHENSIVE METABOLIC PANEL - Abnormal; Notable for the following:    Sodium 123 (*)    Chloride 90 (*)    Glucose, Bld 668 (*)    BUN 30 (*)    Creatinine, Ser 1.72 (*)    Total Protein 8.4 (*)    AST 42 (*)    GFR calc non Af Amer 44 (*)    GFR calc Af Amer 51 (*)    All other components within  normal limits  URINE MICROSCOPIC-ADD ON - Abnormal; Notable for the following:    Bacteria, UA RARE (*)    All other components within normal limits  CBG MONITORING, ED - Abnormal; Notable for the following:    Glucose-Capillary >600 (*)    All other components within normal limits  I-STAT VENOUS BLOOD GAS, ED - Abnormal; Notable for the following:    pH, Ven 7.348 (*)    pCO2, Ven 41.5 (*)    Acid-base deficit 3.0 (*)    All other components within normal limits  CBG MONITORING, ED - Abnormal; Notable for the following:    Glucose-Capillary 573 (*)    All other components within normal limits  CBG MONITORING, ED - Abnormal; Notable for the following:    Glucose-Capillary 437 (*)    All other components  within normal limits  CBG MONITORING, ED - Abnormal; Notable for the following:    Glucose-Capillary 382 (*)    All other components within normal limits  URINALYSIS, ROUTINE W REFLEX MICROSCOPIC (NOT AT [facility])  BLOOD GAS, VENOUS    Imaging Review No results found. I have personally reviewed and evaluated these images and lab results as part of my medical decision-making.   EKG Interpretation None      MDM  Patient was seen and evaluated in stable condition. Patient hyperglycemic on laboratory studies with venous pH of 7.348. Anion gap was normal. No ketones in his urine. Patient reported feeling well. He was given IV fluids as well as a dose of insulin.  Glucose improved with treatment. Patient remained well-appearing. He reported that he would follow-up with his primary care physician. He felt comfortable with plan for discharge. Patient was discharged home in stable condition. Final diagnoses:  Hyperglycemia    1. Hyperglycemia    Leta Baptist, MD 11/04/15 [phone]

## 2015-11-04 NOTE — Progress Notes (Signed)
Pre visit review using our clinic review tool, if applicable. No additional management support is needed unless otherwise documented below in the visit note.  Pt here to pick up new glucometer and CBG check in office. CBG > 600. Pt took Lantus 16 units last night as directed. Referred to ED per Malva Coganody Martin, PA-C. Pt escorted to ED via wheelchair and checked in. Report called to ED MD by Malva Coganody Martin, PA-C.  Starla Linkarolyn J Keiana Tavella, RN

## 2015-11-04 NOTE — ED Notes (Signed)
His glucometer was not working this am so he came to his MD for a new machine. His accucheck in the office was over 600. He drove himself here. He is alert oriented.

## 2015-11-04 NOTE — ED Notes (Signed)
CBG done by EMT KB

## 2015-11-04 NOTE — Telephone Encounter (Signed)
Pt notified of instructions and verbalized understanding. Meter placed in nurse visit room w/ pt's name and DOB on it. Lancets sent to pharmacy. Pt placed on nurse schedule, but unsure exactly when he will be able to come today. Will check CBG whenever pt arrives.

## 2015-11-05 ENCOUNTER — Ambulatory Visit: Payer: Medicare Other | Admitting: Physician Assistant

## 2015-11-05 ENCOUNTER — Ambulatory Visit (INDEPENDENT_AMBULATORY_CARE_PROVIDER_SITE_OTHER): Payer: Medicare Other | Admitting: Medical

## 2015-11-05 ENCOUNTER — Encounter: Payer: Self-pay | Admitting: Medical

## 2015-11-05 ENCOUNTER — Telehealth: Payer: Self-pay | Admitting: Physician Assistant

## 2015-11-05 VITALS — BP 100/60 | HR 78 | Temp 98.1°F | Ht 67.0 in | Wt 204.0 lb

## 2015-11-05 DIAGNOSIS — Z794 Long term (current) use of insulin: Secondary | ICD-10-CM | POA: Diagnosis not present

## 2015-11-05 DIAGNOSIS — R739 Hyperglycemia, unspecified: Secondary | ICD-10-CM

## 2015-11-05 DIAGNOSIS — E1140 Type 2 diabetes mellitus with diabetic neuropathy, unspecified: Secondary | ICD-10-CM

## 2015-11-05 LAB — POCT GLUCOSE (DEVICE FOR HOME USE)
GLUCOSE FASTING, POC: 380 mg/dL — AB (ref 70–99)
POC Glucose: 380 mg/dl — AB (ref 70–99)

## 2015-11-05 NOTE — Patient Instructions (Addendum)
Your sugars are much better today than yesterday. I want you to increase lantus as we discussed. I want you to check you sugars each day and document reading. If you feel ill as well check you sugars. If sugars over 450 then ED evaluation.  I want you to follow up on Tuesday morning to bring in your blood sugar readings.  Also at that time will repeat your cbc and cmp. If you get any fever or infection signs or symptoms notify us. May need to work you up for infection that may have caused spike in your sugars which in past were better controlled. If infection symptoms occur over weekend then ED evaluation.

## 2015-11-05 NOTE — Progress Notes (Signed)
Subjective:    Patient ID: Justin Stone, male    DOB: 1960-08-02, 55 y.o.   MRN: [id number]  HPI   Pt in for follow up from ED.(I reviewed ED records)  Yesterday in the ED his sugar was over 600. Today his blood sugar was 380. Pt just ate before he got here. At fish sandwich.   Pt states he use 16 units of lantus last night. Pt was told to increase 2 units each night based on hs fasting am sugars.   Pt a1c recently  Was  9.0. 7 months ago was 4.2. Pt not drinking any regular sodas.   Discussed with pt foods to avoid. Advised/recommend  investigatae glycemic index foods on line.   Pt on review has no obvious infection signs or symptoms.   Pt states yesterday in am faint lower abdomen pain then he belched pain decreased.     Review of Systems  Constitutional: Negative for chills and fatigue.  Respiratory: Negative for apnea, cough, choking, shortness of breath and wheezing.   Cardiovascular: Negative for chest pain and palpitations.  Gastrointestinal: Negative for abdominal pain and abdominal distention.       Some yesterday intemittently yesterday. He belched yesterday. And syptoms resolved.  Musculoskeletal: Negative for back pain.  Neurological: Negative for dizziness and headaches.  Hematological: Negative for adenopathy. Does not bruise/bleed easily.  Psychiatric/Behavioral: Negative for behavioral problems and confusion.    Past Medical History  Diagnosis Date  . Diabetes mellitus without complication (HCC)   . Hypertension     Social History   Social History  . Marital Status: Single    Spouse Name: N/A  . Number of Children: N/A  . Years of Education: N/A   Occupational History  . Not on file.   Social History Main Topics  . Smoking status: Former Games developermoker  . Smokeless tobacco: Not on file  . Alcohol Use: Yes  . Drug Use: No  . Sexual Activity: Not on file   Other Topics Concern  . Not on file   Social History Narrative    No past surgical  history on file.  Family History  Problem Relation Age of Onset  . Diabetes Maternal Uncle   . Diabetes Maternal Grandmother     No Known Allergies  Current Outpatient Prescriptions on File Prior to Visit  Medication Sig Dispense Refill  . ALPRAZolam (XANAX) 1 MG tablet TAKE 1 TABLET BY MOUTH AT BEDTIME AS NEEDED 30 tablet 1  . amLODipine (NORVASC) 10 MG tablet Take 1 tablet (10 mg total) by mouth daily. 30 tablet 5  . B-D ULTRAFINE III SHORT PEN 31G X 8 MM MISC USE 1 NEEDLE DAILY OR AS DIRECTED.  1  . carisoprodol (SOMA) 350 MG tablet Take 1 tablet (350 mg total) by mouth 3 (three) times daily. 90 tablet 1  . furosemide (LASIX) 40 MG tablet Take 40 mg by mouth daily.    Marland Kitchen. gabapentin (NEURONTIN) 300 MG capsule TAKE 2 CAPSULES BY MOUTH THREE TIMES DAILY 540 capsule 0  . Insulin Syringe-Needle U-100 (INSULIN SYRINGE 1CC/31GX5/16") 31G X 5/16" 1 ML MISC USE 1 SYRINGE D OR UTD.  0  . LANTUS SOLOSTAR 100 UNIT/ML Solostar Pen Inject 10 Units into the skin at bedtime. Sliding scale 15 mL 1  . lisinopril (PRINIVIL,ZESTRIL) 10 MG tablet Take 10 mg by mouth daily.    Marland Kitchen. morphine (MS CONTIN) 15 MG 12 hr tablet Take 1 tablet (15 mg total) by mouth every 12 (twelve)  hours. 60 tablet 0  . ONE TOUCH ULTRA TEST test strip 2 (two) times daily. for testing  3  . ONETOUCH DELICA LANCETS 33G MISC Use to check blood sugar twice daily. 100 each 5  . Oxycodone HCl 10 MG TABS Take 1 tablet (10 mg total) by mouth 2 (two) times daily as needed. 60 tablet 0  . simvastatin (ZOCOR) 10 MG tablet Take 10 mg by mouth at bedtime.    Marland Kitchen venlafaxine (EFFEXOR) 75 MG tablet Take 1 tablet (75 mg total) by mouth 2 (two) times daily. 180 tablet 1   No current facility-administered medications on file prior to visit.    BP 100/60 mmHg  Pulse 78  Temp(Src) 98.1 F (36.7 C) (Oral)  Ht  (1.702 m)  Wt 204 lb (92.534 kg)  BMI 31.94 kg/m2  SpO2 98%       Objective:   Physical Exam  General Appearance- Not in acute  distress.  HEENT Eyes- Scleraeral/Conjuntiva-bilat- Not Yellow. Mouth & Throat- Normal.  Chest and Lung Exam Auscultation: Breath sounds:-Normal. Adventitious sounds:- No Adventitious sounds.  Cardiovascular Auscultation:Rythm - Regular. Heart Sounds -Normal heart sounds.  Abdomen Inspection:-Inspection Normal.  Palpation/Perucssion: Palpation and Percussion of the abdomen reveal- Non Tender, No Rebound tenderness, No rigidity(Guarding) and No Palpable abdominal masses.  Liver:-Normal.  Spleen:- Normal.        Assessment & Plan:  Your sugars are much better today than yesterday. I want you to increase lantus as we discussed. I want you to check you sugars each day and document reading. If you feel ill as well check you sugars. If sugars over 450 then ED evaluation.  I want you to follow up on Tuesday morning to bring in your blood sugar readings.  Also at that time will repeat your cbc and cmp. If you get any fever or infection signs or symptoms notify us. May need to work you up for infection that may have caused spike in your sugars which in past were better controlled. If infection symptoms occur over weekend then ED evaluation.

## 2015-11-05 NOTE — Telephone Encounter (Signed)
Called pt and he states he "feels fantastic" today. He did not know he had an appt scheduled w/ Cody today. He reports he took Lantus last night as ordered, but is not sure exactly what dose he took. He thinks he took around 16 units.   Per Cody's 4/40/17 OV note: Will increase his Lantus to 15 units tonight. He is to take once he is home. Is to check fasting sugar and a PM sugar each day. Increase Lantus 2 units per night for next 2 nights until FU Wednesday.  Pt has not checked CBG this am as he was not able to figure out how to use his new glucometer, and he did not pick up new lancets from the pharmacy yesterday (he forgot after he was d/c'd from ED). Pt declined teaching on new glucometer at nurse visit yesterday, stating that he already had 2 at home and could figure out how to use it. Pt scheduled for follow-up appt today w/ Esperanza RichtersEdward Saguier, PA-C at 3:30. He will pick up lancets from pharmacy downstairs at this time, and will also receive teaching on new glucometer at appt.

## 2015-11-05 NOTE — Telephone Encounter (Signed)
Patient did not show up for ER follow-up. Please call him to make sure he is ok.

## 2015-11-05 NOTE — Progress Notes (Signed)
Pre visit review using our clinic review tool, if applicable. No additional management support is needed unless otherwise documented below in the visit note. 

## 2015-11-06 ENCOUNTER — Telehealth: Payer: Self-pay | Admitting: Physician Assistant

## 2015-11-06 MED ORDER — ONETOUCH ULTRA BLUE VI STRP: 1.0000 | ORAL_STRIP | Freq: Two times a day (BID) | Status: DC

## 2015-11-06 NOTE — Telephone Encounter (Signed)
Pt came in 11/05/15 3:30pm with Justin Stone - pts 2nd no show, charge or no charge for appt 10:30am with you?

## 2015-11-06 NOTE — Telephone Encounter (Signed)
Spoke with pt and advised pt that he feels more comfortable with taking his blood sugar. I also advised pt that test strips were sent to the pharmacy and they would be ready for pick up today. Pt voices understanding.

## 2015-11-06 NOTE — Telephone Encounter (Signed)
No charge. 

## 2015-11-06 NOTE — Telephone Encounter (Signed)
Caller name: Rogen Relation to pt: self Call back number: [phone] [facility] [address] [place]   Reason for call: Pt called requesting is needing ONE TOUCH Verio Flex test STRIPS. Pt states is needing his strips for this weekend since he only has two left and he is requiered to talk his blood sugar twice a day. Please advise ASAP.

## 2015-11-10 ENCOUNTER — Telehealth: Payer: Self-pay

## 2015-11-10 NOTE — Telephone Encounter (Signed)
Might be best for him to go ahead and bring in blood sugar readings fasting in am. See his pcp this week.

## 2015-11-10 NOTE — Telephone Encounter (Signed)
So are these his numbers despite increasing his insulin each day. He should be on about 20 units of lantus now. I expect his numbers to be better? How many units is he on now?

## 2015-11-10 NOTE — Telephone Encounter (Signed)
Spoke with pt and he states that he wanted to update the provider that saw him last with his most recent blood sugars. Pt states that his blood sugar was 337 on 4/13, 4/16 pt states that it was 266. Pt states that it was 327 on 11/10/15 and he states that he forgot to take his medication the night before. Advised pt that I would let the provider know about the blood sugars and he states that he is feeling better.

## 2015-11-11 ENCOUNTER — Ambulatory Visit: Payer: Medicare Other

## 2015-11-11 NOTE — Telephone Encounter (Signed)
Left message for pt to call back and set up a follow up appointment with his pcp this week and for the patient to keep track of the blood sugar readings in the morning. Pt was advised to call back with any questions.

## 2015-11-11 NOTE — Telephone Encounter (Signed)
Patient scheduled with PCP for 11/14/2015

## 2015-11-14 ENCOUNTER — Emergency Department (HOSPITAL_BASED_OUTPATIENT_CLINIC_OR_DEPARTMENT_OTHER)
Admission: EM | Admit: 2015-11-14 | Discharge: 2015-11-14 | Disposition: A | Discharge status: Home / Self Care | Payer: Medicare Other | Attending: Emergency Medicine | Admitting: Emergency Medicine

## 2015-11-14 ENCOUNTER — Encounter (HOSPITAL_BASED_OUTPATIENT_CLINIC_OR_DEPARTMENT_OTHER): Payer: Self-pay | Admitting: *Deleted

## 2015-11-14 ENCOUNTER — Ambulatory Visit (INDEPENDENT_AMBULATORY_CARE_PROVIDER_SITE_OTHER): Payer: Medicare Other | Admitting: Physician Assistant

## 2015-11-14 ENCOUNTER — Encounter: Payer: Self-pay | Admitting: Physician Assistant

## 2015-11-14 VITALS — BP 110/62 | HR 108 | Temp 98.3°F | Ht 67.0 in | Wt 207.6 lb

## 2015-11-14 DIAGNOSIS — E1165 Type 2 diabetes mellitus with hyperglycemia: Secondary | ICD-10-CM | POA: Insufficient documentation

## 2015-11-14 DIAGNOSIS — Z79899 Other long term (current) drug therapy: Secondary | ICD-10-CM | POA: Diagnosis not present

## 2015-11-14 DIAGNOSIS — Z87891 Personal history of nicotine dependence: Secondary | ICD-10-CM | POA: Diagnosis not present

## 2015-11-14 DIAGNOSIS — I10 Essential (primary) hypertension: Secondary | ICD-10-CM | POA: Diagnosis not present

## 2015-11-14 DIAGNOSIS — Z794 Long term (current) use of insulin: Secondary | ICD-10-CM | POA: Insufficient documentation

## 2015-11-14 DIAGNOSIS — R739 Hyperglycemia, unspecified: Secondary | ICD-10-CM

## 2015-11-14 LAB — CBC WITH DIFFERENTIAL/PLATELET
BASOS PCT: 0 %
Basophils Absolute: 0 10*3/uL (ref 0.0–0.1)
Eosinophils Absolute: 0.1 10*3/uL (ref 0.0–0.7)
Eosinophils Relative: 1 %
HCT: 37 % — ABNORMAL LOW (ref 39.0–52.0)
HEMOGLOBIN: 12 g/dL — AB (ref 13.0–17.0)
LYMPHS ABS: 1.3 10*3/uL (ref 0.7–4.0)
Lymphocytes Relative: 14 %
MCH: 27.7 pg (ref 26.0–34.0)
MCHC: 32.4 g/dL (ref 30.0–36.0)
MCV: 85.5 fL (ref 78.0–100.0)
Monocytes Absolute: 0.8 10*3/uL (ref 0.1–1.0)
Monocytes Relative: 8 %
NEUTROS PCT: 77 %
Neutro Abs: 7.6 10*3/uL (ref 1.7–7.7)
Platelets: 160 10*3/uL (ref 150–400)
RBC: 4.33 MIL/uL (ref 4.22–5.81)
RDW: 15.6 % — ABNORMAL HIGH (ref 11.5–15.5)
WBC: 9.8 10*3/uL (ref 4.0–10.5)

## 2015-11-14 LAB — BASIC METABOLIC PANEL
Anion gap: 8 (ref 5–15)
BUN: 13 mg/dL (ref 6–20)
CHLORIDE: 96 mmol/L — AB (ref 101–111)
CO2: 25 mmol/L (ref 22–32)
Calcium: 9.7 mg/dL (ref 8.9–10.3)
Creatinine, Ser: 1.13 mg/dL (ref 0.61–1.24)
GFR calc non Af Amer: >60 mL/min (ref >60)
Glucose, Bld: 467 mg/dL — ABNORMAL HIGH (ref 65–99)
POTASSIUM: 4.4 mmol/L (ref 3.5–5.1)
SODIUM: 129 mmol/L — AB (ref 135–145)

## 2015-11-14 LAB — CBG MONITORING, ED
GLUCOSE-CAPILLARY: 346 mg/dL — AB (ref 65–99)
Glucose-Capillary: 449 mg/dL — ABNORMAL HIGH (ref 65–99)

## 2015-11-14 MED ORDER — SODIUM CHLORIDE 0.9 % IV BOLUS (SEPSIS)
1000.0000 mL | Freq: Once | INTRAVENOUS | Status: AC
  Administered 2015-11-14: 1000 mL via INTRAVENOUS

## 2015-11-14 NOTE — Progress Notes (Signed)
 Patient presents to clinic today c/o for follow-up of uncontrolled DM II. Patient is to be taking at least 20 units of Lantus daily at present due to significant elevation in glucose. Endorses he is only up to 17. Endorses home sugars averaging 280-350. Denies polyuria, polydipsia or polyphagia. Denies change in vision. Has eaten this morning -- endorses having a hot dog and rice pudding.  Past Medical History  Diagnosis Date  . Diabetes mellitus without complication (HCC)   . Hypertension     Current Outpatient Prescriptions on File Prior to Visit  Medication Sig Dispense Refill  . ALPRAZolam (XANAX) 1 MG tablet TAKE 1 TABLET BY MOUTH AT BEDTIME AS NEEDED 30 tablet 1  . amLODipine (NORVASC) 10 MG tablet Take 1 tablet (10 mg total) by mouth daily. 30 tablet 5  . B-D ULTRAFINE III SHORT PEN 31G X 8 MM MISC USE 1 NEEDLE DAILY OR AS DIRECTED.  1  . carisoprodol (SOMA) 350 MG tablet Take 1 tablet (350 mg total) by mouth 3 (three) times daily. 90 tablet 1  . furosemide (LASIX) 40 MG tablet Take 40 mg by mouth daily.    . gabapentin (NEURONTIN) 300 MG capsule TAKE 2 CAPSULES BY MOUTH THREE TIMES DAILY 540 capsule 0  . Insulin Syringe-Needle U-100 (INSULIN SYRINGE 1CC/31GX5/16") 31G X 5/16" 1 ML MISC USE 1 SYRINGE D OR UTD.  0  . LANTUS SOLOSTAR 100 UNIT/ML Solostar Pen Inject 10 Units into the skin at bedtime. Sliding scale 15 mL 1  . lisinopril (PRINIVIL,ZESTRIL) 10 MG tablet Take 10 mg by mouth daily.    . morphine (MS CONTIN) 15 MG 12 hr tablet Take 1 tablet (15 mg total) by mouth every 12 (twelve) hours. 60 tablet 0  . ONE TOUCH ULTRA TEST test strip 1 each by Other route 2 (two) times daily. for testing 100 each 3  . ONETOUCH DELICA LANCETS 33G MISC Use to check blood sugar twice daily. 100 each 5  . Oxycodone HCl 10 MG TABS Take 1 tablet (10 mg total) by mouth 2 (two) times daily as needed. 60 tablet 0  . simvastatin (ZOCOR) 10 MG tablet Take 10 mg by mouth at bedtime.    . venlafaxine  (EFFEXOR) 75 MG tablet Take 1 tablet (75 mg total) by mouth 2 (two) times daily. 180 tablet 1   No current facility-administered medications on file prior to visit.    No Known Allergies  Family History  Problem Relation Age of Onset  . Diabetes Maternal Uncle   . Diabetes Maternal Grandmother     Social History   Social History  . Marital Status: Single    Spouse Name: N/A  . Number of Children: N/A  . Years of Education: N/A   Social History Main Topics  . Smoking status: Former Smoker  . Smokeless tobacco: None  . Alcohol Use: Yes  . Drug Use: No  . Sexual Activity: Not Asked   Other Topics Concern  . None   Social History Narrative   Review of Systems - See HPI.  All other ROS are negative.  BP 110/62 mmHg  Pulse 108  Temp(Src) 98.3 F (36.8 C) (Oral)  Ht 5' 7" (1.702 m)  Wt 207 lb 9.6 oz (94.167 kg)  BMI 32.51 kg/m2  SpO2 98%  Physical Exam  Constitutional: He is oriented to person, place, and time and well-developed, well-nourished, and in no distress.  HENT:  Head: Normocephalic and atraumatic.  Cardiovascular: Normal rate, regular rhythm, normal   heart sounds and intact distal pulses.   Pulmonary/Chest: Effort normal and breath sounds normal. No respiratory distress. He has no wheezes. He has no rales. He exhibits no tenderness.  Neurological: He is alert and oriented to person, place, and time.  Skin: Skin is warm and dry. No rash noted.  Psychiatric: Affect normal.  Vitals reviewed.   Recent Results (from the past 2160 hour(s))  Urinalysis, Routine w reflex microscopic     Status: Abnormal   Collection Time: 10/22/15  1:57 PM  Result Value Ref Range   Color, Urine YELLOW Yellow;Lt. Yellow   APPearance CLEAR Clear   Specific Gravity, Urine <=1.005 (A) 1.000 - 1.030   pH 5.5 5.0 - 8.0   Total Protein, Urine NEGATIVE Negative   Urine Glucose >=1000 (A) Negative   Ketones, ur TRACE (A) Negative   Bilirubin Urine NEGATIVE Negative   Hgb urine  dipstick TRACE-INTACT (A) Negative   Urobilinogen, UA 0.2 0.0 - 1.0   Leukocytes, UA NEGATIVE Negative   Nitrite NEGATIVE Negative   RBC / HPF 0-2/hpf 0-2/hpf   Squamous Epithelial / LPF Rare(0-4/hpf) Rare(0-4/hpf)  Hepatic function panel     Status: Abnormal   Collection Time: 10/22/15  1:57 PM  Result Value Ref Range   Total Bilirubin 0.6 0.2 - 1.2 mg/dL   Bilirubin, Direct 0.2 0.0 - 0.3 mg/dL   Alkaline Phosphatase 89 39 - 117 U/L   AST 26 0 - 37 U/L   ALT 34 0 - 53 U/L   Total Protein 8.8 (H) 6.0 - 8.3 g/dL   Albumin 4.3 3.5 - 5.2 g/dL  Basic Metabolic Panel (BMET)     Status: Abnormal   Collection Time: 10/30/15 10:40 AM  Result Value Ref Range   Sodium 129 (L) 135 - 145 mEq/L   Potassium 3.9 3.5 - 5.1 mEq/L   Chloride 92 (L) 96 - 112 mEq/L   CO2 23 19 - 32 mEq/L   Glucose, Bld 488 (H) 70 - 99 mg/dL   BUN 18 6 - 23 mg/dL   Creatinine, Ser 1.34 0.40 - 1.50 mg/dL   Calcium 10.3 8.4 - 10.5 mg/dL   GFR 71.52 >60.00 mL/min  Urinalysis, Routine w reflex microscopic     Status: Abnormal   Collection Time: 10/30/15 10:40 AM  Result Value Ref Range   Color, Urine YELLOW Yellow;Lt. Yellow   APPearance CLEAR Clear   Specific Gravity, Urine <=1.005 (A) 1.000 - 1.030   pH 5.5 5.0 - 8.0   Total Protein, Urine NEGATIVE Negative   Urine Glucose >=1000 (A) Negative   Ketones, ur TRACE (A) Negative   Bilirubin Urine NEGATIVE Negative   Hgb urine dipstick TRACE-INTACT (A) Negative   Urobilinogen, UA 0.2 0.0 - 1.0   Leukocytes, UA NEGATIVE Negative   Nitrite NEGATIVE Negative   WBC, UA 0-2/hpf 0-2/hpf   RBC / HPF 0-2/hpf 0-2/hpf  HgB A1c     Status: Abnormal   Collection Time: 10/30/15 10:40 AM  Result Value Ref Range   Hgb A1c MFr Bld 9.0 (H) 4.6 - 6.5 %    Comment: Glycemic Control Guidelines for People with Diabetes:Non Diabetic:  <6%Goal of Therapy: <7%Additional Action Suggested:  >4%   Basic metabolic panel     Status: Abnormal   Collection Time: 11/03/15  4:42 PM  Result  Value Ref Range   Sodium 122 (L) 135 - 145 mEq/L   Potassium 4.2 3.5 - 5.1 mEq/L   Chloride 88 (L) 96 - 112 mEq/L  CO2 22 19 - 32 mEq/L   Glucose, Bld 608 (HH) 70 - 99 mg/dL   BUN 27 (H) 6 - 23 mg/dL   Creatinine, Ser 1.55 (H) 0.40 - 1.50 mg/dL   Calcium 10.0 8.4 - 10.5 mg/dL   GFR 60.46 >60.00 mL/min  POC Glucose (CBG)     Status: Abnormal   Collection Time: 11/03/15  4:46 PM  Result Value Ref Range   POC Glucose 513 (A) 70 - 99 mg/dl  POC Glucose (CBG)     Status: Abnormal   Collection Time: 11/03/15  5:00 PM  Result Value Ref Range   POC Glucose 490 (A) 70 - 99 mg/dl    Comment: Second CBG after 10 units of Humalog KwikPen 200 units per ml.  POCT Glucose (Device for Home Use)     Status: Abnormal   Collection Time: 11/04/15 11:15 AM  Result Value Ref Range   Glucose Fasting, POC  70 - 99 mg/dL   POC Glucose >600 70 - 99 mg/dl  POC CBG, ED     Status: Abnormal   Collection Time: 11/04/15 11:49 AM  Result Value Ref Range   Glucose-Capillary >600 (HH) 65 - 99 mg/dL  Urinalysis, Routine w reflex microscopic (not at [facility])     Status: Abnormal   Collection Time: 11/04/15 11:55 AM  Result Value Ref Range   Color, Urine YELLOW YELLOW   APPearance CLEAR CLEAR   Specific Gravity, Urine 1.025 1.005 - 1.030   pH 5.5 5.0 - 8.0   Glucose, UA >1000 (A) NEGATIVE mg/dL   Hgb urine dipstick NEGATIVE NEGATIVE   Bilirubin Urine NEGATIVE NEGATIVE   Ketones, ur NEGATIVE NEGATIVE mg/dL   Protein, ur NEGATIVE NEGATIVE mg/dL   Nitrite NEGATIVE NEGATIVE   Leukocytes, UA NEGATIVE NEGATIVE  Urine microscopic-add on     Status: Abnormal   Collection Time: 11/04/15 11:55 AM  Result Value Ref Range   Squamous Epithelial / LPF NONE SEEN NONE SEEN   WBC, UA 0-5 0 - 5 WBC/hpf   RBC / HPF 0-5 0 - 5 RBC/hpf   Bacteria, UA RARE (A) NONE SEEN   Urine-Other MUCOUS PRESENT   I-Stat venous blood gas, ED     Status: Abnormal   Collection Time: 11/04/15 12:21 PM  Result Value Ref Range   pH, Ven 7.348  (H) 7.250 - 7.300   pCO2, Ven 41.5 (L) 45.0 - 50.0 mmHg   pO2, Ven 42.0 31.0 - 45.0 mmHg   Bicarbonate 22.9 20.0 - 24.0 mEq/L   TCO2 24 0 - 100 mmol/L   O2 Saturation 75.0 %   Acid-base deficit 3.0 (H) 0.0 - 2.0 mmol/L   Patient temperature 98.5 F    Collection site RADIAL, ALLEN'S TEST ACCEPTABLE    Drawn by Operator    Sample type VENOUS   CBC with Differential     Status: Abnormal   Collection Time: 11/04/15  1:15 PM  Result Value Ref Range   WBC 14.2 (H) 4.0 - 10.5 K/uL   RBC 4.31 4.22 - 5.81 MIL/uL   Hemoglobin 11.8 (L) 13.0 - 17.0 g/dL   HCT 35.7 (L) 39.0 - 52.0 %   MCV 82.8 78.0 - 100.0 fL   MCH 27.4 26.0 - 34.0 pg   MCHC 33.1 30.0 - 36.0 g/dL   RDW 14.4 11.5 - 15.5 %   Platelets 124 (L) 150 - 400 K/uL   Neutrophils Relative % 88 %   Neutro Abs 12.4 (H) 1.7 - 7.7 K/uL  Lymphocytes Relative 5 %   Lymphs Abs 0.7 0.7 - 4.0 K/uL   Monocytes Relative 7 %   Monocytes Absolute 1.1 (H) 0.1 - 1.0 K/uL   Eosinophils Relative 0 %   Eosinophils Absolute 0.0 0.0 - 0.7 K/uL   Basophils Relative 0 %   Basophils Absolute 0.0 0.0 - 0.1 K/uL  Comprehensive metabolic panel     Status: Abnormal   Collection Time: 11/04/15  1:15 PM  Result Value Ref Range   Sodium 123 (L) 135 - 145 mmol/L   Potassium 4.1 3.5 - 5.1 mmol/L   Chloride 90 (L) 101 - 111 mmol/L   CO2 22 22 - 32 mmol/L   Glucose, Bld 668 (HH) 65 - 99 mg/dL    Comment: REPEATED TO VERIFY CRITICAL RESULT CALLED TO, READ BACK BY AND VERIFIED WITH: JOSS BENTON RN @1400 11/04/15 OLSONM    BUN 30 (H) 6 - 20 mg/dL   Creatinine, Ser 1.72 (H) 0.61 - 1.24 mg/dL   Calcium 9.5 8.9 - 10.3 mg/dL   Total Protein 8.4 (H) 6.5 - 8.1 g/dL   Albumin 4.0 3.5 - 5.0 g/dL   AST 42 (H) 15 - 41 U/L   ALT 49 17 - 63 U/L   Alkaline Phosphatase 107 38 - 126 U/L   Total Bilirubin 0.9 0.3 - 1.2 mg/dL   GFR calc non Af Amer 44 (L) >60 mL/min   GFR calc Af Amer 51 (L) >60 mL/min    Comment: (NOTE) The eGFR has been calculated using the CKD EPI  equation. This calculation has not been validated in all clinical situations. eGFR's persistently <60 mL/min signify possible Chronic Kidney Disease.    Anion gap 11 5 - 15  POC CBG, ED     Status: Abnormal   Collection Time: 11/04/15  1:20 PM  Result Value Ref Range   Glucose-Capillary 573 (HH) 65 - 99 mg/dL   Comment 1 Document in Chart   POC CBG, ED     Status: Abnormal   Collection Time: 11/04/15  3:03 PM  Result Value Ref Range   Glucose-Capillary 437 (H) 65 - 99 mg/dL  CBG monitoring, ED     Status: Abnormal   Collection Time: 11/04/15  4:29 PM  Result Value Ref Range   Glucose-Capillary 382 (H) 65 - 99 mg/dL  POCT Glucose (Device for Home Use)     Status: Abnormal   Collection Time: 11/05/15  4:15 PM  Result Value Ref Range   Glucose Fasting, POC 380 (A) 70 - 99 mg/dL   POC Glucose 380 (A) 70 - 99 mg/dl    Assessment/Plan: 1. Uncontrolled type 2 diabetes mellitus with hyperglycemia, with long-term current use of insulin (HCC) POC glucose at 590. Patient asymptomatic. Is not titrating insulin as directed. Humalog 10 units given by nursing staff. Patient hydrated over the next hour with PO liquids. Repeat POC glucose at 550. Patient triaged to ER for I fluids and insulin to get glucose to a safe level. Titration schedule reviewed with patient before sending him to ER. Will FU on Monday.    

## 2015-11-14 NOTE — ED Provider Notes (Signed)
CSN: [id number]     Arrival date & time 11/14/15  1328 History   First MD Initiated Contact with Patient 11/14/15 1342     Chief Complaint  Patient presents with  . Hyperglycemia   HPI Patient presented to the emergency room for evaluation of hyperglycemia. Patient has history of diabetes. He has been taking his medications regularly but has not been able to monitor his blood sugar properly because of some difficulties having with a new machine. Patient denies any particular complaints. He is not having any trouble with any chest pain, shortness of breath, fevers, chills, vomiting, diarrhea or any other complaints.  He went to his doctor's office today for a routine visit. Sugar was elevated in the 500s. Patient was given 10 units of insulin subcutaneous. They monitored his blood sugar but it can need to remain elevated. Patient was sent down to the emergency room for IV fluids and testing. Past Medical History  Diagnosis Date  . Diabetes mellitus without complication (HCC)   . Hypertension    History reviewed. No pertinent past surgical history. Family History  Problem Relation Age of Onset  . Diabetes Maternal Uncle   . Diabetes Maternal Grandmother    Social History  Substance Use Topics  . Smoking status: Former Games developermoker  . Smokeless tobacco: None  . Alcohol Use: Yes    Review of Systems  Constitutional: Negative for fever.  HENT: Negative for voice change.   Respiratory: Negative for shortness of breath.   Cardiovascular: Negative for palpitations.  Gastrointestinal: Negative for abdominal pain.  Musculoskeletal: Negative for joint swelling and neck pain.  Skin: Negative for color change.  Neurological: Negative for numbness (no paresthesias).       No muscle weakness  Psychiatric/Behavioral: Negative for confusion.  All other systems reviewed and are negative.     Allergies  Review of patient's allergies indicates no known allergies.  Home Medications   Prior to  Admission medications   Medication Sig Start Date End Date Taking? Authorizing Provider  ALPRAZolam Prudy Feeler(XANAX) 1 MG tablet TAKE 1 TABLET BY MOUTH AT BEDTIME AS NEEDED 09/30/15   Waldon MerlWilliam C Martin, PA-C  amLODipine (NORVASC) 10 MG tablet Take 1 tablet (10 mg total) by mouth daily. 04/29/15   Waldon MerlWilliam C Martin, PA-C  B-D ULTRAFINE III SHORT PEN 31G X 8 MM MISC USE 1 NEEDLE DAILY OR AS DIRECTED. 11/25/14   Historical Provider, MD  carisoprodol (SOMA) 350 MG tablet Take 1 tablet (350 mg total) by mouth 3 (three) times daily. 10/31/15   Waldon MerlWilliam C Martin, PA-C  furosemide (LASIX) 40 MG tablet Take 40 mg by mouth daily.    Historical Provider, MD  gabapentin (NEURONTIN) 300 MG capsule TAKE 2 CAPSULES BY MOUTH THREE TIMES DAILY 09/19/15   Waldon MerlWilliam C Martin, PA-C  Insulin Syringe-Needle U-100 (INSULIN SYRINGE 1CC/31GX5/16") 31G X 5/16" 1 ML MISC USE 1 SYRINGE D OR UTD. 12/05/14   Historical Provider, MD  LANTUS SOLOSTAR 100 UNIT/ML Solostar Pen Inject 10 Units into the skin at bedtime. Sliding scale 10/31/15   Waldon MerlWilliam C Martin, PA-C  lisinopril (PRINIVIL,ZESTRIL) 10 MG tablet Take 10 mg by mouth daily.    Historical Provider, MD  morphine (MS CONTIN) 15 MG 12 hr tablet Take 1 tablet (15 mg total) by mouth every 12 (twelve) hours. 10/31/15   Waldon MerlWilliam C Martin, PA-C  ONE TOUCH ULTRA TEST test strip 1 each by Other route 2 (two) times daily. for testing 11/06/15   Waldon MerlWilliam C Martin, PA-C  [facility]  DELICA LANCETS 33G MISC Use to check blood sugar twice daily. 11/04/15   Waldon Merl, PA-C  Oxycodone HCl 10 MG TABS Take 1 tablet (10 mg total) by mouth 2 (two) times daily as needed. 10/31/15   Waldon Merl, PA-C  simvastatin (ZOCOR) 10 MG tablet Take 10 mg by mouth at bedtime.    Historical Provider, MD  venlafaxine (EFFEXOR) 75 MG tablet Take 1 tablet (75 mg total) by mouth 2 (two) times daily. 03/25/15   Waldon Merl, PA-C   BP 149/89 mmHg  Pulse 93  Temp(Src) 97.8 F (36.6 C) (Oral)  Resp 18  Ht  (1.702 m)  Wt  93.895 kg  BMI 32.41 kg/m2  SpO2 95% Physical Exam  Constitutional: He appears well-developed and well-nourished. No distress.  HENT:  Head: Normocephalic and atraumatic.  Right Ear: External ear normal.  Left Ear: External ear normal.  Eyes: Conjunctivae are normal. Right eye exhibits no discharge. Left eye exhibits no discharge. No scleral icterus.  Neck: Neck supple. No tracheal deviation present.  Cardiovascular: Normal rate, regular rhythm and intact distal pulses.   Pulmonary/Chest: Effort normal and breath sounds normal. No stridor. No respiratory distress. He has no wheezes. He has no rales.  Abdominal: Soft. Bowel sounds are normal. He exhibits no distension. There is no tenderness. There is no rebound and no guarding.  Musculoskeletal: He exhibits no edema or tenderness.  Status post amputation right lower extremity  Neurological: He is alert. He has normal strength. No cranial nerve deficit (no facial droop, extraocular movements intact, no slurred speech) or sensory deficit. He exhibits normal muscle tone. He displays no seizure activity. Coordination normal.  Skin: Skin is warm and dry. No rash noted.  Psychiatric: He has a normal mood and affect.  Nursing note and vitals reviewed.   ED Course  Procedures (including critical care time) Labs Review Labs Reviewed  CBC WITH DIFFERENTIAL/PLATELET - Abnormal; Notable for the following:    Hemoglobin 12.0 (*)    HCT 37.0 (*)    RDW 15.6 (*)    All other components within normal limits  BASIC METABOLIC PANEL - Abnormal; Notable for the following:    Sodium 129 (*)    Chloride 96 (*)    Glucose, Bld 467 (*)    All other components within normal limits  CBG MONITORING, ED - Abnormal; Notable for the following:    Glucose-Capillary 449 (*)    All other components within normal limits  CBG MONITORING, ED - Abnormal; Notable for the following:    Glucose-Capillary 346 (*)    All other components within normal limits      MDM   Final diagnoses:  Hyperglycemia    Patient was given insulin at his doctor's office. He was given IV fluids here in the emergency room. His initial blood sugar was 467. No evidence of diabetic ketoacidosis. Repeat CBG is down to 346.  Patient is asymptomatic. He is stable for discharge. I gave him instructions on monitoring his diet. He will continue his current diabetic regimen and has plans to follow up with his primary care provider.    Linwood Dibbles, MD 11/14/15 [phone]

## 2015-11-14 NOTE — Patient Instructions (Signed)
Please go downstairs to the ER with the nurse. We need to get your sugars down to a safe level.  Unless the ER MD says otherwise -- increase the Lantus to 20 units. Minimize carbs. Stay hydrated. Follow-up with me on Monday.

## 2015-11-14 NOTE — ED Notes (Signed)
She is being sent from her doctors office upstairs for elevated blood sugar of 500.

## 2015-11-14 NOTE — Progress Notes (Signed)
Pre visit review using our clinic tool,if applicable. No additional management support is needed unless otherwise documented below in the visit note.  

## 2015-11-14 NOTE — Discharge Instructions (Signed)
Hyperglycemia °Hyperglycemia occurs when the glucose (sugar) in your blood is too high. Hyperglycemia can happen for many reasons, but it most often happens to people who do not know they have diabetes or are not managing their diabetes properly.  °CAUSES  °Whether you have diabetes or not, there are other causes of hyperglycemia. Hyperglycemia can occur when you have diabetes, but it can also occur in other situations that you might not be as aware of, such as: °Diabetes °· If you have diabetes and are having problems controlling your blood glucose, hyperglycemia could occur because of some of the following reasons: °· Not following your meal plan. °· Not taking your diabetes medications or not taking it properly. °· Exercising less or doing less activity than you normally do. °· Being sick. °Pre-diabetes °· This cannot be ignored. Before people develop Type 2 diabetes, they almost always have "pre-diabetes." This is when your blood glucose levels are higher than normal, but not yet high enough to be diagnosed as diabetes. Research has shown that some long-term damage to the body, especially the heart and circulatory system, may already be occurring during pre-diabetes. If you take action to manage your blood glucose when you have pre-diabetes, you may delay or prevent Type 2 diabetes from developing. °Stress °· If you have diabetes, you may be "diet" controlled or on oral medications or insulin to control your diabetes. However, you may find that your blood glucose is higher than usual in the hospital whether you have diabetes or not. This is often referred to as "stress hyperglycemia." Stress can elevate your blood glucose. This happens because of hormones put out by the body during times of stress. If stress has been the cause of your high blood glucose, it can be followed regularly by your caregiver. That way he/she can make sure your hyperglycemia does not continue to get worse or progress to  diabetes. °Steroids °· Steroids are medications that act on the infection fighting system (immune system) to block inflammation or infection. One side effect can be a rise in blood glucose. Most people can produce enough extra insulin to allow for this rise, but for those who cannot, steroids make blood glucose levels go even higher. It is not unusual for steroid treatments to "uncover" diabetes that is developing. It is not always possible to determine if the hyperglycemia will go away after the steroids are stopped. A special blood test called an A1c is sometimes done to determine if your blood glucose was elevated before the steroids were started. °SYMPTOMS °· Thirsty. °· Frequent urination. °· Dry mouth. °· Blurred vision. °· Tired or fatigue. °· Weakness. °· Sleepy. °· Tingling in feet or leg. °DIAGNOSIS  °Diagnosis is made by monitoring blood glucose in one or all of the following ways: °· A1c test. This is a chemical found in your blood. °· Fingerstick blood glucose monitoring. °· Laboratory results. °TREATMENT  °First, knowing the cause of the hyperglycemia is important before the hyperglycemia can be treated. Treatment may include, but is not be limited to: °· Education. °· Change or adjustment in medications. °· Change or adjustment in meal plan. °· Treatment for an illness, infection, etc. °· More frequent blood glucose monitoring. °· Change in exercise plan. °· Decreasing or stopping steroids. °· Lifestyle changes. °HOME CARE INSTRUCTIONS  °· Test your blood glucose as directed. °· Exercise regularly. Your caregiver will give you instructions about exercise. Pre-diabetes or diabetes which comes on with stress is helped by exercising. °· Eat wholesome,   balanced meals. Eat often and at regular, fixed times. Your caregiver or nutritionist will give you a meal plan to guide your sugar intake. °· Being at an ideal weight is important. If needed, losing as little as 10 to 15 pounds may help improve blood  glucose levels. °SEEK MEDICAL CARE IF:  °· You have questions about medicine, activity, or diet. °· You continue to have symptoms (problems such as increased thirst, urination, or weight gain). °SEEK IMMEDIATE MEDICAL CARE IF:  °· You are vomiting or have diarrhea. °· Your breath smells fruity. °· You are breathing faster or slower. °· You are very sleepy or incoherent. °· You have numbness, tingling, or pain in your feet or hands. °· You have chest pain. °· Your symptoms get worse even though you have been following your caregiver's orders. °· If you have any other questions or concerns. °  °This information is not intended to replace advice given to you by your health care provider. Make sure you discuss any questions you have with your health care provider. °  °Document Released: 01/05/2001 Document Revised: 10/04/2011 Document Reviewed: 03/18/2015 °Elsevier Interactive Patient Education ©2016 [employer] °Diabetes Mellitus and Food °It is important for you to manage your blood sugar (glucose) level. Your blood glucose level can be greatly affected by what you eat. Eating healthier foods in the appropriate amounts throughout the day at about the same time each day will help you control your blood glucose level. It can also help slow or prevent worsening of your diabetes mellitus. Healthy eating may even help you improve the level of your blood pressure and reach or maintain a healthy weight.  °General recommendations for healthful eating and cooking habits include: °· Eating meals and snacks regularly. Avoid going long periods of time without eating to lose weight. °· Eating a diet that consists mainly of plant-based foods, such as fruits, vegetables, nuts, legumes, and whole grains. °· Using low-heat cooking methods, such as baking, instead of high-heat cooking methods, such as deep frying. °Work with your dietitian to make sure you understand how to use the Nutrition Facts information on food labels. °HOW CAN  FOOD AFFECT ME? °Carbohydrates °Carbohydrates affect your blood glucose level more than any other type of food. Your dietitian will help you determine how many carbohydrates to eat at each meal and teach you how to count carbohydrates. Counting carbohydrates is important to keep your blood glucose at a healthy level, especially if you are using insulin or taking certain medicines for diabetes mellitus. °Alcohol °Alcohol can cause sudden decreases in blood glucose (hypoglycemia), especially if you use insulin or take certain medicines for diabetes mellitus. Hypoglycemia can be a life-threatening condition. Symptoms of hypoglycemia (sleepiness, dizziness, and disorientation) are similar to symptoms of having too much alcohol.  °If your health care provider has given you approval to drink alcohol, do so in moderation and use the following guidelines: °· Women should not have more than one drink per day, and men should not have more than two drinks per day. One drink is equal to: °¨ 12 oz of beer. °¨ 5 oz of wine. °¨ 1½ oz of hard liquor. °· Do not drink on an empty stomach. °· Keep yourself hydrated. Have water, diet soda, or unsweetened iced tea. °· Regular soda, juice, and other mixers might contain a lot of carbohydrates and should be counted. °WHAT FOODS ARE NOT RECOMMENDED? °As you make food choices, it is important to remember that all foods are not the   same. Some foods have fewer nutrients per serving than other foods, even though they might have the same number of calories or carbohydrates. It is difficult to get your body what it needs when you eat foods with fewer nutrients. Examples of foods that you should avoid that are high in calories and carbohydrates but low in nutrients include: °· Trans fats (most processed foods list trans fats on the Nutrition Facts label). °· Regular soda. °· Juice. °· Candy. °· Sweets, such as cake, pie, doughnuts, and cookies. °· Fried foods. °WHAT FOODS CAN I EAT? °Eat  nutrient-rich foods, which will nourish your body and keep you healthy. The food you should eat also will depend on several factors, including: °· The calories you need. °· The medicines you take. °· Your weight. °· Your blood glucose level. °· Your blood pressure level. °· Your cholesterol level. °You should eat a variety of foods, including: °· Protein. °¨ Lean cuts of meat. °¨ Proteins low in saturated fats, such as fish, egg whites, and beans. Avoid processed meats. °· Fruits and vegetables. °¨ Fruits and vegetables that may help control blood glucose levels, such as apples, mangoes, and yams. °· Dairy products. °¨ Choose fat-free or low-fat dairy products, such as milk, yogurt, and cheese. °· Grains, bread, pasta, and rice. °¨ Choose whole grain products, such as multigrain bread, whole oats, and brown rice. These foods may help control blood pressure. °· Fats. °¨ Foods containing healthful fats, such as nuts, avocado, olive oil, canola oil, and fish. °DOES EVERYONE WITH DIABETES MELLITUS HAVE THE SAME MEAL PLAN? °Because every person with diabetes mellitus is different, there is not one meal plan that works for everyone. It is very important that you meet with a dietitian who will help you create a meal plan that is just right for you. °  °This information is not intended to replace advice given to you by your health care provider. Make sure you discuss any questions you have with your health care provider. °  °Document Released: 04/08/2005 Document Revised: 08/02/2014 Document Reviewed: 06/08/2013 °Elsevier Interactive Patient Education ©2016 [employer] ° °

## 2015-11-14 NOTE — ED Notes (Signed)
Pt given urinal.

## 2015-11-17 ENCOUNTER — Encounter: Payer: Self-pay | Admitting: Physician Assistant

## 2015-11-17 ENCOUNTER — Ambulatory Visit (INDEPENDENT_AMBULATORY_CARE_PROVIDER_SITE_OTHER): Payer: Medicare Other | Admitting: Physician Assistant

## 2015-11-17 VITALS — BP 132/70 | HR 103 | Temp 98.0°F | Ht 67.0 in | Wt 219.0 lb

## 2015-11-17 DIAGNOSIS — E1140 Type 2 diabetes mellitus with diabetic neuropathy, unspecified: Secondary | ICD-10-CM | POA: Diagnosis not present

## 2015-11-17 DIAGNOSIS — E1165 Type 2 diabetes mellitus with hyperglycemia: Secondary | ICD-10-CM | POA: Diagnosis not present

## 2015-11-17 DIAGNOSIS — Z794 Long term (current) use of insulin: Secondary | ICD-10-CM | POA: Diagnosis not present

## 2015-11-17 DIAGNOSIS — IMO0002 Reserved for concepts with insufficient information to code with codable children: Secondary | ICD-10-CM

## 2015-11-17 LAB — POCT GLUCOSE (DEVICE FOR HOME USE): POC GLUCOSE: 329 mg/dL — AB (ref 70–99)

## 2015-11-17 MED ORDER — METFORMIN HCL 500 MG PO TABS: 500.0000 mg | ORAL_TABLET | Freq: Two times a day (BID) | ORAL | Status: DC

## 2015-11-17 NOTE — Patient Instructions (Signed)
Please increase Lantus to 22 units.  Start the Metformin taking 1 tablet daily over the next 2 weeks. Continue writing down your fasting sugars in your book so we can review together. Stay hydrated and eat a low-carb diet.   I will call you next week to see how sugars are doing so we can make further changes. If sugars get above 500 with medications, go to the ER.  Follow-up in office in 4 weeks.

## 2015-11-17 NOTE — Progress Notes (Signed)
Patient presents to clinic today for ER follow-up of significant hyperglycemia without DKA. Patient with DM II, uncontrolled. Was seen in office last week for follow-up. Glucose noted at 590. Was not taking insulin at the instructed dose. Patient given hydration and 10 units of short-acting insulin. Glucose rechecked in 1 hour and was only down to 550. Patient was sent to ER where IV hydration and further insulin was given. Patient discharged once sugar stabilized.  Since discharge, patient endorses taking insulin as directed. Is at 20 units now. Has log of fasting sugars, averaging in the low to mid 300s. Is watching diet and staying well hydrated. Denies vision changes or worsening neuropathy.   Past Medical History  Diagnosis Date  . Diabetes mellitus without complication ([place])   . Hypertension     Current Outpatient Prescriptions on File Prior to Visit  Medication Sig Dispense Refill  . ALPRAZolam (XANAX) 1 MG tablet TAKE 1 TABLET BY MOUTH AT BEDTIME AS NEEDED 30 tablet 1  . amLODipine (NORVASC) 10 MG tablet Take 1 tablet (10 mg total) by mouth daily. 30 tablet 5  . B-D ULTRAFINE III SHORT PEN 31G X 8 MM MISC USE 1 NEEDLE DAILY OR AS DIRECTED.  1  . carisoprodol (SOMA) 350 MG tablet Take 1 tablet (350 mg total) by mouth 3 (three) times daily. 90 tablet 1  . furosemide (LASIX) 40 MG tablet Take 40 mg by mouth daily.    Marland Kitchen gabapentin (NEURONTIN) 300 MG capsule TAKE 2 CAPSULES BY MOUTH THREE TIMES DAILY 540 capsule 0  . Insulin Syringe-Needle U-100 (INSULIN SYRINGE 1CC/31GX5/16") 31G X 5/16" 1 ML MISC USE 1 SYRINGE D OR UTD.  0  . LANTUS SOLOSTAR 100 UNIT/ML Solostar Pen Inject 10 Units into the skin at bedtime. Sliding scale 15 mL 1  . lisinopril (PRINIVIL,ZESTRIL) 10 MG tablet Take 10 mg by mouth daily.    Marland Kitchen morphine (MS CONTIN) 15 MG 12 hr tablet Take 1 tablet (15 mg total) by mouth every 12 (twelve) hours. 60 tablet 0  . ONE TOUCH ULTRA TEST test strip 1 each by Other route 2 (two)  times daily. for testing 100 each 3  . ONETOUCH DELICA LANCETS 44W MISC Use to check blood sugar twice daily. 100 each 5  . Oxycodone HCl 10 MG TABS Take 1 tablet (10 mg total) by mouth 2 (two) times daily as needed. 60 tablet 0  . simvastatin (ZOCOR) 10 MG tablet Take 10 mg by mouth at bedtime.     No current facility-administered medications on file prior to visit.    No Known Allergies  Family History  Problem Relation Age of Onset  . Diabetes Maternal Uncle   . Diabetes Maternal Grandmother     Social History   Social History  . Marital Status: Single    Spouse Name: N/A  . Number of Children: N/A  . Years of Education: N/A   Social History Main Topics  . Smoking status: Former Research scientist (life sciences)  . Smokeless tobacco: None  . Alcohol Use: Yes  . Drug Use: No  . Sexual Activity: Not Asked   Other Topics Concern  . None   Social History Narrative   Review of Systems - See HPI.  All other ROS are negative.  BP 132/70 mmHg  Pulse 103  Temp(Src) 98 F (36.7 C) (Oral)  Ht 5' 7" (1.702 m)  Wt 219 lb (99.338 kg)  BMI 34.29 kg/m2  SpO2 98%  Physical Exam  Constitutional: He is  oriented to person, place, and time and well-developed, well-nourished, and in no distress.  HENT:  Head: Normocephalic and atraumatic.  Eyes: Conjunctivae are normal.  Cardiovascular: Normal rate, regular rhythm, normal heart sounds and intact distal pulses.   Pulmonary/Chest: Effort normal and breath sounds normal. No respiratory distress. He has no wheezes. He has no rales. He exhibits no tenderness.  Neurological: He is alert and oriented to person, place, and time.  Skin: Skin is warm and dry. No rash noted.  Psychiatric: Affect normal.  Vitals reviewed.   Recent Results (from the past 2160 hour(s))  Urinalysis, Routine w reflex microscopic     Status: Abnormal   Collection Time: 10/22/15  1:57 PM  Result Value Ref Range   Color, Urine YELLOW Yellow;Lt. Yellow   APPearance CLEAR Clear    Specific Gravity, Urine <=1.005 (A) 1.000 - 1.030   pH 5.5 5.0 - 8.0   Total Protein, Urine NEGATIVE Negative   Urine Glucose >=1000 (A) Negative   Ketones, ur TRACE (A) Negative   Bilirubin Urine NEGATIVE Negative   Hgb urine dipstick TRACE-INTACT (A) Negative   Urobilinogen, UA 0.2 0.0 - 1.0   Leukocytes, UA NEGATIVE Negative   Nitrite NEGATIVE Negative   RBC / HPF 0-2/hpf 0-2/hpf   Squamous Epithelial / LPF Rare(0-4/hpf) Rare(0-4/hpf)  Hepatic function panel     Status: Abnormal   Collection Time: 10/22/15  1:57 PM  Result Value Ref Range   Total Bilirubin 0.6 0.2 - 1.2 mg/dL   Bilirubin, Direct 0.2 0.0 - 0.3 mg/dL   Alkaline Phosphatase 89 39 - 117 U/L   AST 26 0 - 37 U/L   ALT 34 0 - 53 U/L   Total Protein 8.8 (H) 6.0 - 8.3 g/dL   Albumin 4.3 3.5 - 5.2 g/dL  Basic Metabolic Panel (BMET)     Status: Abnormal   Collection Time: 10/30/15 10:40 AM  Result Value Ref Range   Sodium 129 (L) 135 - 145 mEq/L   Potassium 3.9 3.5 - 5.1 mEq/L   Chloride 92 (L) 96 - 112 mEq/L   CO2 23 19 - 32 mEq/L   Glucose, Bld 488 (H) 70 - 99 mg/dL   BUN 18 6 - 23 mg/dL   Creatinine, Ser 1.34 0.40 - 1.50 mg/dL   Calcium 10.3 8.4 - 10.5 mg/dL   GFR 71.52 >60.00 mL/min  Urinalysis, Routine w reflex microscopic     Status: Abnormal   Collection Time: 10/30/15 10:40 AM  Result Value Ref Range   Color, Urine YELLOW Yellow;Lt. Yellow   APPearance CLEAR Clear   Specific Gravity, Urine <=1.005 (A) 1.000 - 1.030   pH 5.5 5.0 - 8.0   Total Protein, Urine NEGATIVE Negative   Urine Glucose >=1000 (A) Negative   Ketones, ur TRACE (A) Negative   Bilirubin Urine NEGATIVE Negative   Hgb urine dipstick TRACE-INTACT (A) Negative   Urobilinogen, UA 0.2 0.0 - 1.0   Leukocytes, UA NEGATIVE Negative   Nitrite NEGATIVE Negative   WBC, UA 0-2/hpf 0-2/hpf   RBC / HPF 0-2/hpf 0-2/hpf  HgB A1c     Status: Abnormal   Collection Time: 10/30/15 10:40 AM  Result Value Ref Range   Hgb A1c MFr Bld 9.0 (H) 4.6 - 6.5 %     Comment: Glycemic Control Guidelines for People with Diabetes:Non Diabetic:  <6%Goal of Therapy: <7%Additional Action Suggested:  >7%   Basic metabolic panel     Status: Abnormal   Collection Time: 11/03/15  4:42 PM  Result Value Ref Range   Sodium 122 (L) 135 - 145 mEq/L   Potassium 4.2 3.5 - 5.1 mEq/L   Chloride 88 (L) 96 - 112 mEq/L   CO2 22 19 - 32 mEq/L   Glucose, Bld 608 (HH) 70 - 99 mg/dL   BUN 27 (H) 6 - 23 mg/dL   Creatinine, Ser 1.55 (H) 0.40 - 1.50 mg/dL   Calcium 10.0 8.4 - 10.5 mg/dL   GFR 60.46 >60.00 mL/min  POC Glucose (CBG)     Status: Abnormal   Collection Time: 11/03/15  4:46 PM  Result Value Ref Range   POC Glucose 513 (A) 70 - 99 mg/dl  POC Glucose (CBG)     Status: Abnormal   Collection Time: 11/03/15  5:00 PM  Result Value Ref Range   POC Glucose 490 (A) 70 - 99 mg/dl    Comment: Second CBG after 10 units of Humalog KwikPen 200 units per ml.  POCT Glucose (Device for Home Use)     Status: Abnormal   Collection Time: 11/04/15 11:15 AM  Result Value Ref Range   Glucose Fasting, POC  70 - 99 mg/dL   POC Glucose >600 70 - 99 mg/dl  POC CBG, ED     Status: Abnormal   Collection Time: 11/04/15 11:49 AM  Result Value Ref Range   Glucose-Capillary >600 (HH) 65 - 99 mg/dL  Urinalysis, Routine w reflex microscopic (not at [facility])     Status: Abnormal   Collection Time: 11/04/15 11:55 AM  Result Value Ref Range   Color, Urine YELLOW YELLOW   APPearance CLEAR CLEAR   Specific Gravity, Urine 1.025 1.005 - 1.030   pH 5.5 5.0 - 8.0   Glucose, UA >1000 (A) NEGATIVE mg/dL   Hgb urine dipstick NEGATIVE NEGATIVE   Bilirubin Urine NEGATIVE NEGATIVE   Ketones, ur NEGATIVE NEGATIVE mg/dL   Protein, ur NEGATIVE NEGATIVE mg/dL   Nitrite NEGATIVE NEGATIVE   Leukocytes, UA NEGATIVE NEGATIVE  Urine microscopic-add on     Status: Abnormal   Collection Time: 11/04/15 11:55 AM  Result Value Ref Range   Squamous Epithelial / LPF NONE SEEN NONE SEEN   WBC, UA 0-5 0 - 5  WBC/hpf   RBC / HPF 0-5 0 - 5 RBC/hpf   Bacteria, UA RARE (A) NONE SEEN   Urine-Other MUCOUS PRESENT   I-Stat venous blood gas, ED     Status: Abnormal   Collection Time: 11/04/15 12:21 PM  Result Value Ref Range   pH, Ven 7.348 (H) 7.250 - 7.300   pCO2, Ven 41.5 (L) 45.0 - 50.0 mmHg   pO2, Ven 42.0 31.0 - 45.0 mmHg   Bicarbonate 22.9 20.0 - 24.0 mEq/L   TCO2 24 0 - 100 mmol/L   O2 Saturation 75.0 %   Acid-base deficit 3.0 (H) 0.0 - 2.0 mmol/L   Patient temperature 98.5 F    Collection site RADIAL, ALLEN'S TEST ACCEPTABLE    Drawn by Operator    Sample type VENOUS   CBC with Differential     Status: Abnormal   Collection Time: 11/04/15  1:15 PM  Result Value Ref Range   WBC 14.2 (H) 4.0 - 10.5 K/uL   RBC 4.31 4.22 - 5.81 MIL/uL   Hemoglobin 11.8 (L) 13.0 - 17.0 g/dL   HCT 35.7 (L) 39.0 - 52.0 %   MCV 82.8 78.0 - 100.0 fL   MCH 27.4 26.0 - 34.0 pg   MCHC 33.1 30.0 - 36.0 g/dL  RDW 14.4 11.5 - 15.5 %   Platelets 124 (L) 150 - 400 K/uL   Neutrophils Relative % 88 %   Neutro Abs 12.4 (H) 1.7 - 7.7 K/uL   Lymphocytes Relative 5 %   Lymphs Abs 0.7 0.7 - 4.0 K/uL   Monocytes Relative 7 %   Monocytes Absolute 1.1 (H) 0.1 - 1.0 K/uL   Eosinophils Relative 0 %   Eosinophils Absolute 0.0 0.0 - 0.7 K/uL   Basophils Relative 0 %   Basophils Absolute 0.0 0.0 - 0.1 K/uL  Comprehensive metabolic panel     Status: Abnormal   Collection Time: 11/04/15  1:15 PM  Result Value Ref Range   Sodium 123 (L) 135 - 145 mmol/L   Potassium 4.1 3.5 - 5.1 mmol/L   Chloride 90 (L) 101 - 111 mmol/L   CO2 22 22 - 32 mmol/L   Glucose, Bld 668 (HH) 65 - 99 mg/dL    Comment: REPEATED TO VERIFY CRITICAL RESULT CALLED TO, READ BACK BY AND VERIFIED WITH: JOSS BENTON RN _0  11/04/15 OLSONM    BUN 30 (H) 6 - 20 mg/dL   Creatinine, Ser 1.72 (H) 0.61 - 1.24 mg/dL   Calcium 9.5 8.9 - 10.3 mg/dL   Total Protein 8.4 (H) 6.5 - 8.1 g/dL   Albumin 4.0 3.5 - 5.0 g/dL   AST 42 (H) 15 - 41 U/L   ALT 49 17 -  63 U/L   Alkaline Phosphatase 107 38 - 126 U/L   Total Bilirubin 0.9 0.3 - 1.2 mg/dL   GFR calc non Af Amer 44 (L) >60 mL/min   GFR calc Af Amer 51 (L) >60 mL/min    Comment: (NOTE) The eGFR has been calculated using the CKD EPI equation. This calculation has not been validated in all clinical situations. eGFR's persistently <60 mL/min signify possible Chronic Kidney Disease.    Anion gap 11 5 - 15  POC CBG, ED     Status: Abnormal   Collection Time: 11/04/15  1:20 PM  Result Value Ref Range   Glucose-Capillary 573 (HH) 65 - 99 mg/dL   Comment 1 Document in Chart   POC CBG, ED     Status: Abnormal   Collection Time: 11/04/15  3:03 PM  Result Value Ref Range   Glucose-Capillary 437 (H) 65 - 99 mg/dL  CBG monitoring, ED     Status: Abnormal   Collection Time: 11/04/15  4:29 PM  Result Value Ref Range   Glucose-Capillary 382 (H) 65 - 99 mg/dL  POCT Glucose (Device for Home Use)     Status: Abnormal   Collection Time: 11/05/15  4:15 PM  Result Value Ref Range   Glucose Fasting, POC 380 (A) 70 - 99 mg/dL   POC Glucose 380 (A) 70 - 99 mg/dl  POC CBG, ED     Status: Abnormal   Collection Time: 11/14/15  1:37 PM  Result Value Ref Range   Glucose-Capillary 449 (H) 65 - 99 mg/dL  CBC with Differential     Status: Abnormal   Collection Time: 11/14/15  1:45 PM  Result Value Ref Range   WBC 9.8 4.0 - 10.5 K/uL   RBC 4.33 4.22 - 5.81 MIL/uL   Hemoglobin 12.0 (L) 13.0 - 17.0 g/dL   HCT 37.0 (L) 39.0 - 52.0 %   MCV 85.5 78.0 - 100.0 fL   MCH 27.7 26.0 - 34.0 pg   MCHC 32.4 30.0 - 36.0 g/dL   RDW 15.6 (H) 11.5 -  15.5 %   Platelets 160 150 - 400 K/uL   Neutrophils Relative % 77 %   Neutro Abs 7.6 1.7 - 7.7 K/uL   Lymphocytes Relative 14 %   Lymphs Abs 1.3 0.7 - 4.0 K/uL   Monocytes Relative 8 %   Monocytes Absolute 0.8 0.1 - 1.0 K/uL   Eosinophils Relative 1 %   Eosinophils Absolute 0.1 0.0 - 0.7 K/uL   Basophils Relative 0 %   Basophils Absolute 0.0 0.0 - 0.1 K/uL  Basic  metabolic panel     Status: Abnormal   Collection Time: 11/14/15  1:45 PM  Result Value Ref Range   Sodium 129 (L) 135 - 145 mmol/L   Potassium 4.4 3.5 - 5.1 mmol/L   Chloride 96 (L) 101 - 111 mmol/L   CO2 25 22 - 32 mmol/L   Glucose, Bld 467 (H) 65 - 99 mg/dL   BUN 13 6 - 20 mg/dL   Creatinine, Ser 1.13 0.61 - 1.24 mg/dL   Calcium 9.7 8.9 - 10.3 mg/dL   GFR calc non Af Amer >60 >60 mL/min   GFR calc Af Amer >60 >60 mL/min    Comment: (NOTE) The eGFR has been calculated using the CKD EPI equation. This calculation has not been validated in all clinical situations. eGFR's persistently <60 mL/min signify possible Chronic Kidney Disease.    Anion gap 8 5 - 15  CBG monitoring, ED     Status: Abnormal   Collection Time: 11/14/15  2:53 PM  Result Value Ref Range   Glucose-Capillary 346 (H) 65 - 99 mg/dL  POCT Glucose (Device for Home Use)     Status: Abnormal   Collection Time: 11/17/15  4:40 PM  Result Value Ref Range   Glucose Fasting, POC  70 - 99 mg/dL   POC Glucose 329 (A) 70 - 99 mg/dl    Assessment/Plan: Diabetes mellitus type II, uncontrolled ([facility]) Glucose in low 300s today which is a marked improvement. Will increase insulin to 22 units. Will add on Metformin 500 mg to take once daily for 2 weeks. Will then titrate up. FU scheduled.

## 2015-11-17 NOTE — Progress Notes (Signed)
Pre visit review using our clinic review tool, if applicable. No additional management support is needed unless otherwise documented below in the visit note. 

## 2015-11-20 NOTE — Assessment & Plan Note (Signed)
Glucose in low 300s today which is a marked improvement. Will increase insulin to 22 units. Will add on Metformin 500 mg to take once daily for 2 weeks. Will then titrate up. FU scheduled.

## 2015-12-02 ENCOUNTER — Other Ambulatory Visit: Payer: Self-pay | Admitting: Physician Assistant

## 2015-12-02 MED ORDER — ALPRAZOLAM 1 MG PO TABS: 1.0000 mg | ORAL_TABLET | Freq: Every evening | ORAL | Status: DC | PRN

## 2015-12-02 NOTE — Telephone Encounter (Signed)
Rx faxed to pharmacy/[facility] 05/09 

## 2015-12-02 NOTE — Telephone Encounter (Signed)
Pt was last seen 11/17/15.  Last UDS was 10/22/15 and pt is due 01/22/16.  Please advise on refill.

## 2015-12-08 ENCOUNTER — Ambulatory Visit (INDEPENDENT_AMBULATORY_CARE_PROVIDER_SITE_OTHER): Payer: Medicare Other | Admitting: Physician Assistant

## 2015-12-08 ENCOUNTER — Encounter: Payer: Self-pay | Admitting: Physician Assistant

## 2015-12-08 VITALS — BP 108/66 | HR 102 | Temp 98.3°F | Resp 16 | Ht 67.0 in | Wt 220.1 lb

## 2015-12-08 DIAGNOSIS — IMO0002 Reserved for concepts with insufficient information to code with codable children: Secondary | ICD-10-CM

## 2015-12-08 DIAGNOSIS — M545 Low back pain: Secondary | ICD-10-CM

## 2015-12-08 DIAGNOSIS — Z794 Long term (current) use of insulin: Secondary | ICD-10-CM

## 2015-12-08 DIAGNOSIS — E1140 Type 2 diabetes mellitus with diabetic neuropathy, unspecified: Secondary | ICD-10-CM

## 2015-12-08 DIAGNOSIS — E1165 Type 2 diabetes mellitus with hyperglycemia: Secondary | ICD-10-CM

## 2015-12-08 DIAGNOSIS — G8929 Other chronic pain: Secondary | ICD-10-CM | POA: Diagnosis not present

## 2015-12-08 LAB — COMPREHENSIVE METABOLIC PANEL
ALBUMIN: 3.9 g/dL (ref 3.5–5.2)
ALK PHOS: 74 U/L (ref 39–117)
ALT: 34 U/L (ref 0–53)
AST: 30 U/L (ref 0–37)
BILIRUBIN TOTAL: 0.6 mg/dL (ref 0.2–1.2)
BUN: 22 mg/dL (ref 6–23)
CO2: 27 mEq/L (ref 19–32)
Calcium: 10 mg/dL (ref 8.4–10.5)
Chloride: 100 mEq/L (ref 96–112)
Creatinine, Ser: 1.15 mg/dL (ref 0.40–1.50)
GFR: 85.29 mL/min (ref >60.00)
Glucose, Bld: 166 mg/dL — ABNORMAL HIGH (ref 70–99)
POTASSIUM: 4.1 meq/L (ref 3.5–5.1)
SODIUM: 133 meq/L — AB (ref 135–145)
Total Protein: 8.9 g/dL — ABNORMAL HIGH (ref 6.0–8.3)

## 2015-12-08 MED ORDER — OXYCODONE HCL 10 MG PO TABS: 10.0000 mg | ORAL_TABLET | Freq: Two times a day (BID) | ORAL | Status: DC | PRN

## 2015-12-08 MED ORDER — MORPHINE SULFATE ER 15 MG PO TBCR: 15.0000 mg | EXTENDED_RELEASE_TABLET | Freq: Two times a day (BID) | ORAL | Status: DC

## 2015-12-08 NOTE — Progress Notes (Signed)
Patient presents to clinic today for follow-up DM II with neuropathy. Patient has been titrating up on Lantus and was recently started on Metformin 500 mg taking 1000 mg AM and 500 mg PM. Patient endorses taking medications as directed. Endorses fasting sugars now averaging in the 150-180 range which is a major improvement for previous visits. Endorses taking his Neurontin as directed.   Past Medical History  Diagnosis Date  . Diabetes mellitus without complication ([place])   . Hypertension     Current Outpatient Prescriptions on File Prior to Visit  Medication Sig Dispense Refill  . ALPRAZolam (XANAX) 1 MG tablet Take 1 tablet (1 mg total) by mouth at bedtime as needed. 30 tablet 1  . amLODipine (NORVASC) 10 MG tablet TAKE 1 TABLET(10 MG) BY MOUTH DAILY 30 tablet 0  . B-D ULTRAFINE III SHORT PEN 31G X 8 MM MISC USE 1 NEEDLE DAILY OR AS DIRECTED.  1  . carisoprodol (SOMA) 350 MG tablet Take 1 tablet (350 mg total) by mouth 3 (three) times daily. 90 tablet 1  . furosemide (LASIX) 40 MG tablet Take 40 mg by mouth daily.    Marland Kitchen gabapentin (NEURONTIN) 300 MG capsule TAKE 2 CAPSULES BY MOUTH THREE TIMES DAILY 540 capsule 0  . Insulin Syringe-Needle U-100 (INSULIN SYRINGE 1CC/31GX5/16") 31G X 5/16" 1 ML MISC USE 1 SYRINGE D OR UTD.  0  . LANTUS SOLOSTAR 100 UNIT/ML Solostar Pen Inject 10 Units into the skin at bedtime. Sliding scale 15 mL 1  . lisinopril (PRINIVIL,ZESTRIL) 10 MG tablet Take 10 mg by mouth daily.    . metFORMIN (GLUCOPHAGE) 500 MG tablet Take 1 tablet (500 mg total) by mouth 2 (two) times daily with a meal. 60 tablet 3  . morphine (MS CONTIN) 15 MG 12 hr tablet Take 1 tablet (15 mg total) by mouth every 12 (twelve) hours. 60 tablet 0  . ONE TOUCH ULTRA TEST test strip 1 each by Other route 2 (two) times daily. for testing 100 each 3  . ONETOUCH DELICA LANCETS 66Z MISC Use to check blood sugar twice daily. 100 each 5  . Oxycodone HCl 10 MG TABS Take 1 tablet (10 mg total) by mouth 2  (two) times daily as needed. 60 tablet 0  . simvastatin (ZOCOR) 10 MG tablet Take 10 mg by mouth at bedtime.    Marland Kitchen venlafaxine XR (EFFEXOR-XR) 75 MG 24 hr capsule Take 1 capsule by mouth daily.  0   No current facility-administered medications on file prior to visit.    No Known Allergies  Family History  Problem Relation Age of Onset  . Diabetes Maternal Uncle   . Diabetes Maternal Grandmother     Social History   Social History  . Marital Status: Single    Spouse Name: N/A  . Number of Children: N/A  . Years of Education: N/A   Social History Main Topics  . Smoking status: Former Research scientist (life sciences)  . Smokeless tobacco: None  . Alcohol Use: Yes  . Drug Use: No  . Sexual Activity: Not Asked   Other Topics Concern  . None   Social History Narrative    Review of Systems - See HPI.  All other ROS are negative.  BP 108/66 mmHg  Pulse 102  Temp(Src) 98.3 F (36.8 C) (Oral)  Resp 16  Ht 5' 7"  (1.702 m)  Wt 220 lb 2 oz (99.848 kg)  BMI 34.47 kg/m2  SpO2 99%  Physical Exam  Constitutional: He is oriented to  person, place, and time and well-developed, well-nourished, and in no distress.  HENT:  Head: Normocephalic and atraumatic.  Eyes: Conjunctivae are normal.  Neck: Neck supple.  Cardiovascular: Normal rate, regular rhythm, normal heart sounds and intact distal pulses.   Pulmonary/Chest: Effort normal. No respiratory distress. He has no wheezes. He has no rales. He exhibits no tenderness.  Neurological: He is alert and oriented to person, place, and time.  Skin: Skin is warm and dry. No rash noted.  Psychiatric: Affect normal.  Vitals reviewed.   Recent Results (from the past 2160 hour(s))  Urinalysis, Routine w reflex microscopic     Status: Abnormal   Collection Time: 10/22/15  1:57 PM  Result Value Ref Range   Color, Urine YELLOW Yellow;Lt. Yellow   APPearance CLEAR Clear   Specific Gravity, Urine <=1.005 (A) 1.000 - 1.030   pH 5.5 5.0 - 8.0   Total Protein,  Urine NEGATIVE Negative   Urine Glucose >=1000 (A) Negative   Ketones, ur TRACE (A) Negative   Bilirubin Urine NEGATIVE Negative   Hgb urine dipstick TRACE-INTACT (A) Negative   Urobilinogen, UA 0.2 0.0 - 1.0   Leukocytes, UA NEGATIVE Negative   Nitrite NEGATIVE Negative   RBC / HPF 0-2/hpf 0-2/hpf   Squamous Epithelial / LPF Rare(0-4/hpf) Rare(0-4/hpf)  Hepatic function panel     Status: Abnormal   Collection Time: 10/22/15  1:57 PM  Result Value Ref Range   Total Bilirubin 0.6 0.2 - 1.2 mg/dL   Bilirubin, Direct 0.2 0.0 - 0.3 mg/dL   Alkaline Phosphatase 89 39 - 117 U/L   AST 26 0 - 37 U/L   ALT 34 0 - 53 U/L   Total Protein 8.8 (H) 6.0 - 8.3 g/dL   Albumin 4.3 3.5 - 5.2 g/dL  Basic Metabolic Panel (BMET)     Status: Abnormal   Collection Time: 10/30/15 10:40 AM  Result Value Ref Range   Sodium 129 (L) 135 - 145 mEq/L   Potassium 3.9 3.5 - 5.1 mEq/L   Chloride 92 (L) 96 - 112 mEq/L   CO2 23 19 - 32 mEq/L   Glucose, Bld 488 (H) 70 - 99 mg/dL   BUN 18 6 - 23 mg/dL   Creatinine, Ser 1.34 0.40 - 1.50 mg/dL   Calcium 10.3 8.4 - 10.5 mg/dL   GFR 71.52 >60.00 mL/min  Urinalysis, Routine w reflex microscopic     Status: Abnormal   Collection Time: 10/30/15 10:40 AM  Result Value Ref Range   Color, Urine YELLOW Yellow;Lt. Yellow   APPearance CLEAR Clear   Specific Gravity, Urine <=1.005 (A) 1.000 - 1.030   pH 5.5 5.0 - 8.0   Total Protein, Urine NEGATIVE Negative   Urine Glucose >=1000 (A) Negative   Ketones, ur TRACE (A) Negative   Bilirubin Urine NEGATIVE Negative   Hgb urine dipstick TRACE-INTACT (A) Negative   Urobilinogen, UA 0.2 0.0 - 1.0   Leukocytes, UA NEGATIVE Negative   Nitrite NEGATIVE Negative   WBC, UA 0-2/hpf 0-2/hpf   RBC / HPF 0-2/hpf 0-2/hpf  HgB A1c     Status: Abnormal   Collection Time: 10/30/15 10:40 AM  Result Value Ref Range   Hgb A1c MFr Bld 9.0 (H) 4.6 - 6.5 %    Comment: Glycemic Control Guidelines for People with Diabetes:Non Diabetic:   <6%Goal of Therapy: <7%Additional Action Suggested:  >5%   Basic metabolic panel     Status: Abnormal   Collection Time: 11/03/15  4:42 PM  Result  Value Ref Range   Sodium 122 (L) 135 - 145 mEq/L   Potassium 4.2 3.5 - 5.1 mEq/L   Chloride 88 (L) 96 - 112 mEq/L   CO2 22 19 - 32 mEq/L   Glucose, Bld 608 (HH) 70 - 99 mg/dL   BUN 27 (H) 6 - 23 mg/dL   Creatinine, Ser 1.55 (H) 0.40 - 1.50 mg/dL   Calcium 10.0 8.4 - 10.5 mg/dL   GFR 60.46 >60.00 mL/min  POC Glucose (CBG)     Status: Abnormal   Collection Time: 11/03/15  4:46 PM  Result Value Ref Range   POC Glucose 513 (A) 70 - 99 mg/dl  POC Glucose (CBG)     Status: Abnormal   Collection Time: 11/03/15  5:00 PM  Result Value Ref Range   POC Glucose 490 (A) 70 - 99 mg/dl    Comment: Second CBG after 10 units of Humalog KwikPen 200 units per ml.  POCT Glucose (Device for Home Use)     Status: Abnormal   Collection Time: 11/04/15 11:15 AM  Result Value Ref Range   Glucose Fasting, POC  70 - 99 mg/dL   POC Glucose >600 70 - 99 mg/dl  POC CBG, ED     Status: Abnormal   Collection Time: 11/04/15 11:49 AM  Result Value Ref Range   Glucose-Capillary >600 (HH) 65 - 99 mg/dL  Urinalysis, Routine w reflex microscopic (not at [facility])     Status: Abnormal   Collection Time: 11/04/15 11:55 AM  Result Value Ref Range   Color, Urine YELLOW YELLOW   APPearance CLEAR CLEAR   Specific Gravity, Urine 1.025 1.005 - 1.030   pH 5.5 5.0 - 8.0   Glucose, UA >1000 (A) NEGATIVE mg/dL   Hgb urine dipstick NEGATIVE NEGATIVE   Bilirubin Urine NEGATIVE NEGATIVE   Ketones, ur NEGATIVE NEGATIVE mg/dL   Protein, ur NEGATIVE NEGATIVE mg/dL   Nitrite NEGATIVE NEGATIVE   Leukocytes, UA NEGATIVE NEGATIVE  Urine microscopic-add on     Status: Abnormal   Collection Time: 11/04/15 11:55 AM  Result Value Ref Range   Squamous Epithelial / LPF NONE SEEN NONE SEEN   WBC, UA 0-5 0 - 5 WBC/hpf   RBC / HPF 0-5 0 - 5 RBC/hpf   Bacteria, UA RARE (A) NONE SEEN    Urine-Other MUCOUS PRESENT   I-Stat venous blood gas, ED     Status: Abnormal   Collection Time: 11/04/15 12:21 PM  Result Value Ref Range   pH, Ven 7.348 (H) 7.250 - 7.300   pCO2, Ven 41.5 (L) 45.0 - 50.0 mmHg   pO2, Ven 42.0 31.0 - 45.0 mmHg   Bicarbonate 22.9 20.0 - 24.0 mEq/L   TCO2 24 0 - 100 mmol/L   O2 Saturation 75.0 %   Acid-base deficit 3.0 (H) 0.0 - 2.0 mmol/L   Patient temperature 98.5 F    Collection site RADIAL, ALLEN'S TEST ACCEPTABLE    Drawn by Operator    Sample type VENOUS   CBC with Differential     Status: Abnormal   Collection Time: 11/04/15  1:15 PM  Result Value Ref Range   WBC 14.2 (H) 4.0 - 10.5 K/uL   RBC 4.31 4.22 - 5.81 MIL/uL   Hemoglobin 11.8 (L) 13.0 - 17.0 g/dL   HCT 35.7 (L) 39.0 - 52.0 %   MCV 82.8 78.0 - 100.0 fL   MCH 27.4 26.0 - 34.0 pg   MCHC 33.1 30.0 - 36.0 g/dL   RDW  14.4 11.5 - 15.5 %   Platelets 124 (L) 150 - 400 K/uL   Neutrophils Relative % 88 %   Neutro Abs 12.4 (H) 1.7 - 7.7 K/uL   Lymphocytes Relative 5 %   Lymphs Abs 0.7 0.7 - 4.0 K/uL   Monocytes Relative 7 %   Monocytes Absolute 1.1 (H) 0.1 - 1.0 K/uL   Eosinophils Relative 0 %   Eosinophils Absolute 0.0 0.0 - 0.7 K/uL   Basophils Relative 0 %   Basophils Absolute 0.0 0.0 - 0.1 K/uL  Comprehensive metabolic panel     Status: Abnormal   Collection Time: 11/04/15  1:15 PM  Result Value Ref Range   Sodium 123 (L) 135 - 145 mmol/L   Potassium 4.1 3.5 - 5.1 mmol/L   Chloride 90 (L) 101 - 111 mmol/L   CO2 22 22 - 32 mmol/L   Glucose, Bld 668 (HH) 65 - 99 mg/dL    Comment: REPEATED TO VERIFY CRITICAL RESULT CALLED TO, READ BACK BY AND VERIFIED WITH: JOSS BENTON RN @1400  11/04/15 OLSONM    BUN 30 (H) 6 - 20 mg/dL   Creatinine, Ser 1.72 (H) 0.61 - 1.24 mg/dL   Calcium 9.5 8.9 - 10.3 mg/dL   Total Protein 8.4 (H) 6.5 - 8.1 g/dL   Albumin 4.0 3.5 - 5.0 g/dL   AST 42 (H) 15 - 41 U/L   ALT 49 17 - 63 U/L   Alkaline Phosphatase 107 38 - 126 U/L   Total Bilirubin 0.9 0.3 -  1.2 mg/dL   GFR calc non Af Amer 44 (L) >60 mL/min   GFR calc Af Amer 51 (L) >60 mL/min    Comment: (NOTE) The eGFR has been calculated using the CKD EPI equation. This calculation has not been validated in all clinical situations. eGFR's persistently <60 mL/min signify possible Chronic Kidney Disease.    Anion gap 11 5 - 15  POC CBG, ED     Status: Abnormal   Collection Time: 11/04/15  1:20 PM  Result Value Ref Range   Glucose-Capillary 573 (HH) 65 - 99 mg/dL   Comment 1 Document in Chart   POC CBG, ED     Status: Abnormal   Collection Time: 11/04/15  3:03 PM  Result Value Ref Range   Glucose-Capillary 437 (H) 65 - 99 mg/dL  CBG monitoring, ED     Status: Abnormal   Collection Time: 11/04/15  4:29 PM  Result Value Ref Range   Glucose-Capillary 382 (H) 65 - 99 mg/dL  POCT Glucose (Device for Home Use)     Status: Abnormal   Collection Time: 11/05/15  4:15 PM  Result Value Ref Range   Glucose Fasting, POC 380 (A) 70 - 99 mg/dL   POC Glucose 380 (A) 70 - 99 mg/dl  POC CBG, ED     Status: Abnormal   Collection Time: 11/14/15  1:37 PM  Result Value Ref Range   Glucose-Capillary 449 (H) 65 - 99 mg/dL  CBC with Differential     Status: Abnormal   Collection Time: 11/14/15  1:45 PM  Result Value Ref Range   WBC 9.8 4.0 - 10.5 K/uL   RBC 4.33 4.22 - 5.81 MIL/uL   Hemoglobin 12.0 (L) 13.0 - 17.0 g/dL   HCT 37.0 (L) 39.0 - 52.0 %   MCV 85.5 78.0 - 100.0 fL   MCH 27.7 26.0 - 34.0 pg   MCHC 32.4 30.0 - 36.0 g/dL   RDW 15.6 (H) 11.5 - 15.5 %  Platelets 160 150 - 400 K/uL   Neutrophils Relative % 77 %   Neutro Abs 7.6 1.7 - 7.7 K/uL   Lymphocytes Relative 14 %   Lymphs Abs 1.3 0.7 - 4.0 K/uL   Monocytes Relative 8 %   Monocytes Absolute 0.8 0.1 - 1.0 K/uL   Eosinophils Relative 1 %   Eosinophils Absolute 0.1 0.0 - 0.7 K/uL   Basophils Relative 0 %   Basophils Absolute 0.0 0.0 - 0.1 K/uL  Basic metabolic panel     Status: Abnormal   Collection Time: 11/14/15  1:45 PM  Result  Value Ref Range   Sodium 129 (L) 135 - 145 mmol/L   Potassium 4.4 3.5 - 5.1 mmol/L   Chloride 96 (L) 101 - 111 mmol/L   CO2 25 22 - 32 mmol/L   Glucose, Bld 467 (H) 65 - 99 mg/dL   BUN 13 6 - 20 mg/dL   Creatinine, Ser 1.13 0.61 - 1.24 mg/dL   Calcium 9.7 8.9 - 10.3 mg/dL   GFR calc non Af Amer >60 >60 mL/min   GFR calc Af Amer >60 >60 mL/min    Comment: (NOTE) The eGFR has been calculated using the CKD EPI equation. This calculation has not been validated in all clinical situations. eGFR's persistently <60 mL/min signify possible Chronic Kidney Disease.    Anion gap 8 5 - 15  CBG monitoring, ED     Status: Abnormal   Collection Time: 11/14/15  2:53 PM  Result Value Ref Range   Glucose-Capillary 346 (H) 65 - 99 mg/dL  POCT Glucose (Device for Home Use)     Status: Abnormal   Collection Time: 11/17/15  4:40 PM  Result Value Ref Range   Glucose Fasting, POC  70 - 99 mg/dL   POC Glucose 329 (A) 70 - 99 mg/dl    Assessment/Plan: 1. Chronic low back pain Medications refilled. Follow-up with Pain Management as scheduled for injections.  - Oxycodone HCl 10 MG TABS; Take 1 tablet (10 mg total) by mouth 2 (two) times daily as needed.  Dispense: 60 tablet; Refill: 0 - morphine (MS CONTIN) 15 MG 12 hr tablet; Take 1 tablet (15 mg total) by mouth every 12 (twelve) hours.  Dispense: 60 tablet; Refill: 0 - Comp Met (CMET)  2. Uncontrolled type 2 diabetes mellitus with diabetic neuropathy, with long-term current use of insulin (HCC) Improving. Will check renal function today. If stable will increase Metformin to max dose. Continue Lantus as directed. FU 3 months.

## 2015-12-08 NOTE — Progress Notes (Signed)
Pre visit review using our clinic review tool, if applicable. No additional management support is needed unless otherwise documented below in the visit note/SLS  

## 2015-12-08 NOTE — Patient Instructions (Signed)
Please continue chronic medications.  Go to the lab for blood work. I will call you with your results. We will increase Metformin as long as your renal function still looks good.  Continue diet and exercise. Continue checking fasting sugars.

## 2015-12-09 ENCOUNTER — Telehealth: Payer: Self-pay | Admitting: *Deleted

## 2015-12-09 MED ORDER — METFORMIN HCL 500 MG PO TABS: ORAL_TABLET | ORAL | Status: DC

## 2015-12-09 NOTE — Telephone Encounter (Signed)
-----   Message from Waldon MerlWilliam C Martin, PA-C sent at 12/08/2015  9:14 PM EDT ----- Renal function looks good. Sugars much improved. Will increase to Metformin 1000 mg (2 tablets) in AM and 500 mg (1 tablet) in PM. Follow-up mid July. Of note, sodium still slightly decreased but much improved from previous checks. Is he still taking Lasix? If so recommend we decreased to 1/2 current dose.

## 2015-12-09 NOTE — Telephone Encounter (Signed)
Patient informed, understood & agreed; new Rx to pharmacy/SLS 05/16

## 2015-12-11 ENCOUNTER — Other Ambulatory Visit: Payer: Self-pay | Admitting: Physician Assistant

## 2015-12-12 NOTE — Telephone Encounter (Signed)
Rx's sent to the pharmacy by e-script.//AB/CMA 

## 2015-12-16 ENCOUNTER — Ambulatory Visit: Payer: Medicare Other | Admitting: Physician Assistant

## 2015-12-19 ENCOUNTER — Encounter: Payer: Self-pay | Admitting: Physician Assistant

## 2015-12-19 ENCOUNTER — Ambulatory Visit (INDEPENDENT_AMBULATORY_CARE_PROVIDER_SITE_OTHER): Payer: Medicare Other | Admitting: Physician Assistant

## 2015-12-19 VITALS — BP 118/64 | HR 97 | Temp 97.8°F | Ht 67.0 in | Wt 216.4 lb

## 2015-12-19 DIAGNOSIS — I10 Essential (primary) hypertension: Secondary | ICD-10-CM | POA: Diagnosis not present

## 2015-12-19 DIAGNOSIS — M545 Low back pain: Secondary | ICD-10-CM | POA: Diagnosis not present

## 2015-12-19 DIAGNOSIS — G8929 Other chronic pain: Secondary | ICD-10-CM

## 2015-12-19 MED ORDER — SILDENAFIL CITRATE 20 MG PO TABS
ORAL_TABLET | ORAL | Status: DC
Start: 2015-12-19 — End: 2016-02-13

## 2015-12-19 MED ORDER — OXYCODONE HCL 10 MG PO TABS: 10.0000 mg | ORAL_TABLET | Freq: Two times a day (BID) | ORAL | Status: DC | PRN

## 2015-12-19 MED ORDER — MORPHINE SULFATE ER 15 MG PO TBCR: 15.0000 mg | EXTENDED_RELEASE_TABLET | Freq: Two times a day (BID) | ORAL | Status: DC

## 2015-12-19 NOTE — Progress Notes (Signed)
Pre visit review using our clinic review tool, if applicable. No additional management support is needed unless otherwise documented below in the visit note. 

## 2015-12-19 NOTE — Patient Instructions (Addendum)
Please continue medications as directed.  Keep an eye on the blood sugar levels -- fasting sugar goal is 100-130. Afternoon level goal is < 180.  Call [facility] to see about getting the generic viagra mailed to you -- [phone]  Follow-up in July as scheduled.

## 2015-12-20 ENCOUNTER — Other Ambulatory Visit: Payer: Self-pay | Admitting: Physician Assistant

## 2015-12-22 NOTE — Progress Notes (Signed)
Patient presents to clinic today for follow-up of hypertension. Is currently on amlodipine 10 mg and Lisinopril 10 mg daily. Endorses taking medications as directed. Patient denies chest pain, palpitations, lightheadedness, dizziness, vision changes or frequent headaches.  BP Readings from Last 3 Encounters:  12/19/15 118/64  12/08/15 108/66  11/17/15 132/70   Also following up for chronic pain, followed by Pain Management for injections. Is doing much better after recent procedure. Continues pain medications as directed.  Past Medical History  Diagnosis Date  . Diabetes mellitus without complication ([place])   . Hypertension     Current Outpatient Prescriptions on File Prior to Visit  Medication Sig Dispense Refill  . ALPRAZolam (XANAX) 1 MG tablet Take 1 tablet (1 mg total) by mouth at bedtime as needed. 30 tablet 1  . amLODipine (NORVASC) 10 MG tablet TAKE 1 TABLET(10 MG) BY MOUTH DAILY 30 tablet 0  . carisoprodol (SOMA) 350 MG tablet Take 1 tablet (350 mg total) by mouth 3 (three) times daily. 90 tablet 1  . furosemide (LASIX) 40 MG tablet Take 40 mg by mouth daily.    Marland Kitchen LANTUS SOLOSTAR 100 UNIT/ML Solostar Pen Inject 10 Units into the skin at bedtime. Sliding scale 15 mL 1  . lisinopril (PRINIVIL,ZESTRIL) 10 MG tablet TAKE 1 TABLET BY MOUTH DAILY 90 tablet 1  . metFORMIN (GLUCOPHAGE) 500 MG tablet Take 1000 mg (2 tablets) in AM and 500 mg (1 tablet) in PM 90 tablet 3  . simvastatin (ZOCOR) 10 MG tablet TAKE 1 TABLET BY MOUTH AT BEDTIME 90 tablet 1  . venlafaxine XR (EFFEXOR-XR) 75 MG 24 hr capsule Take 1 capsule by mouth daily.  0  . B-D ULTRAFINE III SHORT PEN 31G X 8 MM MISC Reported on 12/19/2015  1  . Insulin Syringe-Needle U-100 (INSULIN SYRINGE 1CC/31GX5/16") 31G X 5/16" 1 ML MISC Reported on 12/19/2015  0  . ONE TOUCH ULTRA TEST test strip 1 each by Other route 2 (two) times daily. for testing (Patient not taking: Reported on 12/19/2015) 100 each 3  . ONETOUCH DELICA LANCETS  40J MISC Use to check blood sugar twice daily. (Patient not taking: Reported on 12/19/2015) 100 each 5   No current facility-administered medications on file prior to visit.    No Known Allergies  Family History  Problem Relation Age of Onset  . Diabetes Maternal Uncle   . Diabetes Maternal Grandmother     Social History   Social History  . Marital Status: Single    Spouse Name: N/A  . Number of Children: N/A  . Years of Education: N/A   Social History Main Topics  . Smoking status: Former Research scientist (life sciences)  . Smokeless tobacco: None  . Alcohol Use: Yes  . Drug Use: No  . Sexual Activity: Not Asked   Other Topics Concern  . None   Social History Narrative    Review of Systems - See HPI.  All other ROS are negative.  BP 118/64 mmHg  Pulse 97  Temp(Src) 97.8 F (36.6 C) (Oral)  Ht 5' 7"  (1.702 m)  Wt 216 lb 6 oz (98.147 kg)  BMI 33.88 kg/m2  SpO2 97%  Physical Exam  Constitutional: He is oriented to person, place, and time and well-developed, well-nourished, and in no distress.  HENT:  Head: Normocephalic and atraumatic.  Cardiovascular: Normal rate, regular rhythm, normal heart sounds and intact distal pulses.   Pulmonary/Chest: Effort normal and breath sounds normal. No respiratory distress. He has no wheezes. He has  no rales. He exhibits no tenderness.  Neurological: He is alert and oriented to person, place, and time.  Skin: Skin is warm and dry. No rash noted.  Psychiatric: Affect normal.  Vitals reviewed.   Recent Results (from the past 2160 hour(s))  Urinalysis, Routine w reflex microscopic     Status: Abnormal   Collection Time: 10/22/15  1:57 PM  Result Value Ref Range   Color, Urine YELLOW Yellow;Lt. Yellow   APPearance CLEAR Clear   Specific Gravity, Urine <=1.005 (A) 1.000 - 1.030   pH 5.5 5.0 - 8.0   Total Protein, Urine NEGATIVE Negative   Urine Glucose >=1000 (A) Negative   Ketones, ur TRACE (A) Negative   Bilirubin Urine NEGATIVE Negative   Hgb  urine dipstick TRACE-INTACT (A) Negative   Urobilinogen, UA 0.2 0.0 - 1.0   Leukocytes, UA NEGATIVE Negative   Nitrite NEGATIVE Negative   RBC / HPF 0-2/hpf 0-2/hpf   Squamous Epithelial / LPF Rare(0-4/hpf) Rare(0-4/hpf)  Hepatic function panel     Status: Abnormal   Collection Time: 10/22/15  1:57 PM  Result Value Ref Range   Total Bilirubin 0.6 0.2 - 1.2 mg/dL   Bilirubin, Direct 0.2 0.0 - 0.3 mg/dL   Alkaline Phosphatase 89 39 - 117 U/L   AST 26 0 - 37 U/L   ALT 34 0 - 53 U/L   Total Protein 8.8 (H) 6.0 - 8.3 g/dL   Albumin 4.3 3.5 - 5.2 g/dL  Basic Metabolic Panel (BMET)     Status: Abnormal   Collection Time: 10/30/15 10:40 AM  Result Value Ref Range   Sodium 129 (L) 135 - 145 mEq/L   Potassium 3.9 3.5 - 5.1 mEq/L   Chloride 92 (L) 96 - 112 mEq/L   CO2 23 19 - 32 mEq/L   Glucose, Bld 488 (H) 70 - 99 mg/dL   BUN 18 6 - 23 mg/dL   Creatinine, Ser 1.34 0.40 - 1.50 mg/dL   Calcium 10.3 8.4 - 10.5 mg/dL   GFR 71.52 >60.00 mL/min  Urinalysis, Routine w reflex microscopic     Status: Abnormal   Collection Time: 10/30/15 10:40 AM  Result Value Ref Range   Color, Urine YELLOW Yellow;Lt. Yellow   APPearance CLEAR Clear   Specific Gravity, Urine <=1.005 (A) 1.000 - 1.030   pH 5.5 5.0 - 8.0   Total Protein, Urine NEGATIVE Negative   Urine Glucose >=1000 (A) Negative   Ketones, ur TRACE (A) Negative   Bilirubin Urine NEGATIVE Negative   Hgb urine dipstick TRACE-INTACT (A) Negative   Urobilinogen, UA 0.2 0.0 - 1.0   Leukocytes, UA NEGATIVE Negative   Nitrite NEGATIVE Negative   WBC, UA 0-2/hpf 0-2/hpf   RBC / HPF 0-2/hpf 0-2/hpf  HgB A1c     Status: Abnormal   Collection Time: 10/30/15 10:40 AM  Result Value Ref Range   Hgb A1c MFr Bld 9.0 (H) 4.6 - 6.5 %    Comment: Glycemic Control Guidelines for People with Diabetes:Non Diabetic:  <6%Goal of Therapy: <7%Additional Action Suggested:  >9%   Basic metabolic panel     Status: Abnormal   Collection Time: 11/03/15  4:42 PM    Result Value Ref Range   Sodium 122 (L) 135 - 145 mEq/L   Potassium 4.2 3.5 - 5.1 mEq/L   Chloride 88 (L) 96 - 112 mEq/L   CO2 22 19 - 32 mEq/L   Glucose, Bld 608 (HH) 70 - 99 mg/dL   BUN 27 (H) 6 -  23 mg/dL   Creatinine, Ser 1.55 (H) 0.40 - 1.50 mg/dL   Calcium 10.0 8.4 - 10.5 mg/dL   GFR 60.46 >60.00 mL/min  POC Glucose (CBG)     Status: Abnormal   Collection Time: 11/03/15  4:46 PM  Result Value Ref Range   POC Glucose 513 (A) 70 - 99 mg/dl  POC Glucose (CBG)     Status: Abnormal   Collection Time: 11/03/15  5:00 PM  Result Value Ref Range   POC Glucose 490 (A) 70 - 99 mg/dl    Comment: Second CBG after 10 units of Humalog KwikPen 200 units per ml.  POCT Glucose (Device for Home Use)     Status: Abnormal   Collection Time: 11/04/15 11:15 AM  Result Value Ref Range   Glucose Fasting, POC  70 - 99 mg/dL   POC Glucose >600 70 - 99 mg/dl  POC CBG, ED     Status: Abnormal   Collection Time: 11/04/15 11:49 AM  Result Value Ref Range   Glucose-Capillary >600 (HH) 65 - 99 mg/dL  Urinalysis, Routine w reflex microscopic (not at [facility])     Status: Abnormal   Collection Time: 11/04/15 11:55 AM  Result Value Ref Range   Color, Urine YELLOW YELLOW   APPearance CLEAR CLEAR   Specific Gravity, Urine 1.025 1.005 - 1.030   pH 5.5 5.0 - 8.0   Glucose, UA >1000 (A) NEGATIVE mg/dL   Hgb urine dipstick NEGATIVE NEGATIVE   Bilirubin Urine NEGATIVE NEGATIVE   Ketones, ur NEGATIVE NEGATIVE mg/dL   Protein, ur NEGATIVE NEGATIVE mg/dL   Nitrite NEGATIVE NEGATIVE   Leukocytes, UA NEGATIVE NEGATIVE  Urine microscopic-add on     Status: Abnormal   Collection Time: 11/04/15 11:55 AM  Result Value Ref Range   Squamous Epithelial / LPF NONE SEEN NONE SEEN   WBC, UA 0-5 0 - 5 WBC/hpf   RBC / HPF 0-5 0 - 5 RBC/hpf   Bacteria, UA RARE (A) NONE SEEN   Urine-Other MUCOUS PRESENT   I-Stat venous blood gas, ED     Status: Abnormal   Collection Time: 11/04/15 12:21 PM  Result Value Ref Range   pH,  Ven 7.348 (H) 7.250 - 7.300   pCO2, Ven 41.5 (L) 45.0 - 50.0 mmHg   pO2, Ven 42.0 31.0 - 45.0 mmHg   Bicarbonate 22.9 20.0 - 24.0 mEq/L   TCO2 24 0 - 100 mmol/L   O2 Saturation 75.0 %   Acid-base deficit 3.0 (H) 0.0 - 2.0 mmol/L   Patient temperature 98.5 F    Collection site RADIAL, ALLEN'S TEST ACCEPTABLE    Drawn by Operator    Sample type VENOUS   CBC with Differential     Status: Abnormal   Collection Time: 11/04/15  1:15 PM  Result Value Ref Range   WBC 14.2 (H) 4.0 - 10.5 K/uL   RBC 4.31 4.22 - 5.81 MIL/uL   Hemoglobin 11.8 (L) 13.0 - 17.0 g/dL   HCT 35.7 (L) 39.0 - 52.0 %   MCV 82.8 78.0 - 100.0 fL   MCH 27.4 26.0 - 34.0 pg   MCHC 33.1 30.0 - 36.0 g/dL   RDW 14.4 11.5 - 15.5 %   Platelets 124 (L) 150 - 400 K/uL   Neutrophils Relative % 88 %   Neutro Abs 12.4 (H) 1.7 - 7.7 K/uL   Lymphocytes Relative 5 %   Lymphs Abs 0.7 0.7 - 4.0 K/uL   Monocytes Relative 7 %  Monocytes Absolute 1.1 (H) 0.1 - 1.0 K/uL   Eosinophils Relative 0 %   Eosinophils Absolute 0.0 0.0 - 0.7 K/uL   Basophils Relative 0 %   Basophils Absolute 0.0 0.0 - 0.1 K/uL  Comprehensive metabolic panel     Status: Abnormal   Collection Time: 11/04/15  1:15 PM  Result Value Ref Range   Sodium 123 (L) 135 - 145 mmol/L   Potassium 4.1 3.5 - 5.1 mmol/L   Chloride 90 (L) 101 - 111 mmol/L   CO2 22 22 - 32 mmol/L   Glucose, Bld 668 (HH) 65 - 99 mg/dL    Comment: REPEATED TO VERIFY CRITICAL RESULT CALLED TO, READ BACK BY AND VERIFIED WITH: JOSS BENTON RN @1400  11/04/15 OLSONM    BUN 30 (H) 6 - 20 mg/dL   Creatinine, Ser 1.72 (H) 0.61 - 1.24 mg/dL   Calcium 9.5 8.9 - 10.3 mg/dL   Total Protein 8.4 (H) 6.5 - 8.1 g/dL   Albumin 4.0 3.5 - 5.0 g/dL   AST 42 (H) 15 - 41 U/L   ALT 49 17 - 63 U/L   Alkaline Phosphatase 107 38 - 126 U/L   Total Bilirubin 0.9 0.3 - 1.2 mg/dL   GFR calc non Af Amer 44 (L) >60 mL/min   GFR calc Af Amer 51 (L) >60 mL/min    Comment: (NOTE) The eGFR has been calculated using the  CKD EPI equation. This calculation has not been validated in all clinical situations. eGFR's persistently <60 mL/min signify possible Chronic Kidney Disease.    Anion gap 11 5 - 15  POC CBG, ED     Status: Abnormal   Collection Time: 11/04/15  1:20 PM  Result Value Ref Range   Glucose-Capillary 573 (HH) 65 - 99 mg/dL   Comment 1 Document in Chart   POC CBG, ED     Status: Abnormal   Collection Time: 11/04/15  3:03 PM  Result Value Ref Range   Glucose-Capillary 437 (H) 65 - 99 mg/dL  CBG monitoring, ED     Status: Abnormal   Collection Time: 11/04/15  4:29 PM  Result Value Ref Range   Glucose-Capillary 382 (H) 65 - 99 mg/dL  POCT Glucose (Device for Home Use)     Status: Abnormal   Collection Time: 11/05/15  4:15 PM  Result Value Ref Range   Glucose Fasting, POC 380 (A) 70 - 99 mg/dL   POC Glucose 380 (A) 70 - 99 mg/dl  POC CBG, ED     Status: Abnormal   Collection Time: 11/14/15  1:37 PM  Result Value Ref Range   Glucose-Capillary 449 (H) 65 - 99 mg/dL  CBC with Differential     Status: Abnormal   Collection Time: 11/14/15  1:45 PM  Result Value Ref Range   WBC 9.8 4.0 - 10.5 K/uL   RBC 4.33 4.22 - 5.81 MIL/uL   Hemoglobin 12.0 (L) 13.0 - 17.0 g/dL   HCT 37.0 (L) 39.0 - 52.0 %   MCV 85.5 78.0 - 100.0 fL   MCH 27.7 26.0 - 34.0 pg   MCHC 32.4 30.0 - 36.0 g/dL   RDW 15.6 (H) 11.5 - 15.5 %   Platelets 160 150 - 400 K/uL   Neutrophils Relative % 77 %   Neutro Abs 7.6 1.7 - 7.7 K/uL   Lymphocytes Relative 14 %   Lymphs Abs 1.3 0.7 - 4.0 K/uL   Monocytes Relative 8 %   Monocytes Absolute 0.8 0.1 - 1.0  K/uL   Eosinophils Relative 1 %   Eosinophils Absolute 0.1 0.0 - 0.7 K/uL   Basophils Relative 0 %   Basophils Absolute 0.0 0.0 - 0.1 K/uL  Basic metabolic panel     Status: Abnormal   Collection Time: 11/14/15  1:45 PM  Result Value Ref Range   Sodium 129 (L) 135 - 145 mmol/L   Potassium 4.4 3.5 - 5.1 mmol/L   Chloride 96 (L) 101 - 111 mmol/L   CO2 25 22 - 32 mmol/L    Glucose, Bld 467 (H) 65 - 99 mg/dL   BUN 13 6 - 20 mg/dL   Creatinine, Ser 1.13 0.61 - 1.24 mg/dL   Calcium 9.7 8.9 - 10.3 mg/dL   GFR calc non Af Amer >60 >60 mL/min   GFR calc Af Amer >60 >60 mL/min    Comment: (NOTE) The eGFR has been calculated using the CKD EPI equation. This calculation has not been validated in all clinical situations. eGFR's persistently <60 mL/min signify possible Chronic Kidney Disease.    Anion gap 8 5 - 15  CBG monitoring, ED     Status: Abnormal   Collection Time: 11/14/15  2:53 PM  Result Value Ref Range   Glucose-Capillary 346 (H) 65 - 99 mg/dL  POCT Glucose (Device for Home Use)     Status: Abnormal   Collection Time: 11/17/15  4:40 PM  Result Value Ref Range   Glucose Fasting, POC  70 - 99 mg/dL   POC Glucose 329 (A) 70 - 99 mg/dl  Comp Met (CMET)     Status: Abnormal   Collection Time: 12/08/15  3:01 PM  Result Value Ref Range   Sodium 133 (L) 135 - 145 mEq/L   Potassium 4.1 3.5 - 5.1 mEq/L   Chloride 100 96 - 112 mEq/L   CO2 27 19 - 32 mEq/L   Glucose, Bld 166 (H) 70 - 99 mg/dL   BUN 22 6 - 23 mg/dL   Creatinine, Ser 1.15 0.40 - 1.50 mg/dL   Total Bilirubin 0.6 0.2 - 1.2 mg/dL   Alkaline Phosphatase 74 39 - 117 U/L   AST 30 0 - 37 U/L   ALT 34 0 - 53 U/L   Total Protein 8.9 (H) 6.0 - 8.3 g/dL   Albumin 3.9 3.5 - 5.2 g/dL   Calcium 10.0 8.4 - 10.5 mg/dL   GFR 85.29 >60.00 mL/min    Assessment/Plan: 1. Chronic low back pain Tolerating medications well. LFTs have been stable. Medications refilled. - Oxycodone HCl 10 MG TABS; Take 1 tablet (10 mg total) by mouth 2 (two) times daily as needed.  Dispense: 60 tablet; Refill: 0 - morphine (MS CONTIN) 15 MG 12 hr tablet; Take 1 tablet (15 mg total) by mouth every 12 (twelve) hours.  Dispense: 60 tablet; Refill: 0  2. Essential hypertension, benign Controlled. Asymptomatic. Will continue current regimen.

## 2015-12-31 ENCOUNTER — Other Ambulatory Visit: Payer: Self-pay | Admitting: Physician Assistant

## 2016-01-20 ENCOUNTER — Other Ambulatory Visit: Payer: Self-pay | Admitting: *Deleted

## 2016-01-20 MED ORDER — CARISOPRODOL 350 MG PO TABS: 350.0000 mg | ORAL_TABLET | Freq: Three times a day (TID) | ORAL | Status: DC

## 2016-01-20 NOTE — Telephone Encounter (Signed)
See rx. 

## 2016-01-20 NOTE — Telephone Encounter (Signed)
Rx faxed to pharmacy  

## 2016-01-20 NOTE — Telephone Encounter (Signed)
Faxed refill request received from [facility] for Soma 350mg  [1 PO TID] Last filled by MD on 10/31/15, #90x1 Last AEX - 12/20/15 Next AEX - 2 Mths. Rx pending signature/SLS 06/27

## 2016-01-25 ENCOUNTER — Other Ambulatory Visit: Payer: Self-pay | Admitting: Physician Assistant

## 2016-01-26 MED ORDER — FUROSEMIDE 40 MG PO TABS: 40.0000 mg | ORAL_TABLET | Freq: Every day | ORAL | Status: DC

## 2016-01-26 NOTE — Telephone Encounter (Signed)
Rx request for Furosemide & Effexor sent to pharmacy/SLS 07/03

## 2016-01-31 ENCOUNTER — Other Ambulatory Visit: Payer: Self-pay | Admitting: Physician Assistant

## 2016-02-02 NOTE — Telephone Encounter (Signed)
[facility] Last filled - 12/02/15, #30x1 Last AEX - 12/19/15 Please Advise on refills/SLS 07/10

## 2016-02-03 NOTE — Telephone Encounter (Signed)
Rx request to pharmacy/SLS  

## 2016-02-06 ENCOUNTER — Encounter: Payer: Self-pay | Admitting: Gastroenterology

## 2016-02-06 ENCOUNTER — Encounter: Payer: Self-pay | Admitting: Physician Assistant

## 2016-02-06 ENCOUNTER — Ambulatory Visit (INDEPENDENT_AMBULATORY_CARE_PROVIDER_SITE_OTHER): Payer: Medicare Other | Admitting: Physician Assistant

## 2016-02-06 VITALS — BP 132/82 | HR 89 | Temp 98.4°F | Resp 16 | Ht 67.0 in | Wt 214.0 lb

## 2016-02-06 DIAGNOSIS — M545 Low back pain, unspecified: Secondary | ICD-10-CM

## 2016-02-06 DIAGNOSIS — G8929 Other chronic pain: Secondary | ICD-10-CM | POA: Diagnosis not present

## 2016-02-06 DIAGNOSIS — E1140 Type 2 diabetes mellitus with diabetic neuropathy, unspecified: Secondary | ICD-10-CM | POA: Diagnosis not present

## 2016-02-06 DIAGNOSIS — Z1211 Encounter for screening for malignant neoplasm of colon: Secondary | ICD-10-CM | POA: Insufficient documentation

## 2016-02-06 DIAGNOSIS — IMO0002 Reserved for concepts with insufficient information to code with codable children: Secondary | ICD-10-CM

## 2016-02-06 DIAGNOSIS — Z794 Long term (current) use of insulin: Secondary | ICD-10-CM | POA: Diagnosis not present

## 2016-02-06 DIAGNOSIS — E1165 Type 2 diabetes mellitus with hyperglycemia: Secondary | ICD-10-CM | POA: Diagnosis not present

## 2016-02-06 LAB — COMPREHENSIVE METABOLIC PANEL
ALBUMIN: 4.3 g/dL (ref 3.5–5.2)
ALT: 56 U/L — ABNORMAL HIGH (ref 0–53)
AST: 60 U/L — ABNORMAL HIGH (ref 0–37)
Alkaline Phosphatase: 67 U/L (ref 39–117)
BUN: 15 mg/dL (ref 6–23)
CALCIUM: 10 mg/dL (ref 8.4–10.5)
CO2: 26 mEq/L (ref 19–32)
Chloride: 103 mEq/L (ref 96–112)
Creatinine, Ser: 1.15 mg/dL (ref 0.40–1.50)
GFR: 85.24 mL/min (ref >60.00)
Glucose, Bld: 134 mg/dL — ABNORMAL HIGH (ref 70–99)
POTASSIUM: 4.3 meq/L (ref 3.5–5.1)
Sodium: 137 mEq/L (ref 135–145)
TOTAL PROTEIN: 9.1 g/dL — AB (ref 6.0–8.3)
Total Bilirubin: 0.4 mg/dL (ref 0.2–1.2)

## 2016-02-06 LAB — LIPID PANEL
CHOLESTEROL: 148 mg/dL (ref 0–200)
HDL: 59.3 mg/dL (ref >39.00)
LDL CALC: 79 mg/dL (ref 0–99)
NonHDL: 88.66
TRIGLYCERIDES: 46 mg/dL (ref 0.0–149.0)
Total CHOL/HDL Ratio: 2
VLDL: 9.2 mg/dL (ref 0.0–40.0)

## 2016-02-06 LAB — HEMOGLOBIN A1C: HEMOGLOBIN A1C: 4.4 % — AB (ref 4.6–6.5)

## 2016-02-06 MED ORDER — MORPHINE SULFATE ER 15 MG PO TBCR: 15.0000 mg | EXTENDED_RELEASE_TABLET | Freq: Two times a day (BID) | ORAL | Status: DC

## 2016-02-06 MED ORDER — OXYCODONE HCL 10 MG PO TABS: 10.0000 mg | ORAL_TABLET | Freq: Two times a day (BID) | ORAL | Status: DC | PRN

## 2016-02-06 NOTE — Progress Notes (Signed)
Patient presents to clinic today for follow-up of DM II, previously uncontrolled, on insulin therapy. Patient endorses following instructions regarding diet and exercise. Has made significant changes in eating habits. Is taking Metformin as directed but has stopped insulin due to low blood sugar readings, weaning down as previously instructed. Is checking sugar levels at home -- has log with him: Average fasting 100-120. Non-fasting 150-160. Has upcoming appointment with his eye doctor.  Patient overdue for colonoscopy. Denies symptoms or family history of colorectal cancer. Is willing to have screening colonoscopy.  Patient has noted some increased difficulty with sleep at night, despite use of Xanax. Feels is related to his phantom limb pain which has worsened despite pain medication and Gabapentin. Has FU with Pain management next week for repeat assessment and injections.  Past Medical History  Diagnosis Date  . Diabetes mellitus without complication ([place])   . Hypertension     Current Outpatient Prescriptions on File Prior to Visit  Medication Sig Dispense Refill  . ALPRAZolam (XANAX) 1 MG tablet TAKE 1 TABLET BY MOUTH EVERY NIGHT AT BEDTIME AS NEEDED 30 tablet 1  . amLODipine (NORVASC) 10 MG tablet TAKE 1 TABLET(10 MG) BY MOUTH DAILY 30 tablet 5  . B-D ULTRAFINE III SHORT PEN 31G X 8 MM MISC Reported on 12/19/2015  1  . carisoprodol (SOMA) 350 MG tablet Take 1 tablet (350 mg total) by mouth 3 (three) times daily. 90 tablet 0  . furosemide (LASIX) 40 MG tablet Take 1 tablet (40 mg total) by mouth daily. 90 tablet 0  . gabapentin (NEURONTIN) 300 MG capsule TAKE 2 CAPSULES BY MOUTH THREE TIMES DAILY 540 capsule 3  . Insulin Syringe-Needle U-100 (INSULIN SYRINGE 1CC/31GX5/16") 31G X 5/16" 1 ML MISC Reported on 12/19/2015  0  . lisinopril (PRINIVIL,ZESTRIL) 10 MG tablet TAKE 1 TABLET BY MOUTH DAILY 90 tablet 1  . metFORMIN (GLUCOPHAGE) 500 MG tablet Take 1000 mg (2 tablets) in AM and 500 mg  (1 tablet) in PM (Patient taking differently: Take 500 mg (1 tablet) in AM and 500 mg (1 tablet) in PM) 90 tablet 3  . ONE TOUCH ULTRA TEST test strip 1 each by Other route 2 (two) times daily. for testing 100 each 3  . ONETOUCH DELICA LANCETS 24Q MISC Use to check blood sugar twice daily. 100 each 5  . sildenafil (REVATIO) 20 MG tablet Take 1 tablet by mouth as directed 30 minutes before intercourse 100 tablet 0  . simvastatin (ZOCOR) 10 MG tablet TAKE 1 TABLET BY MOUTH AT BEDTIME 90 tablet 1  . venlafaxine (EFFEXOR) 75 MG tablet TAKE 1 TABLET(75 MG) BY MOUTH TWICE DAILY 180 tablet 0  . LANTUS SOLOSTAR 100 UNIT/ML Solostar Pen Inject 10 Units into the skin at bedtime. Sliding scale (Patient not taking: Reported on 02/06/2016) 15 mL 1   No current facility-administered medications on file prior to visit.    No Known Allergies  Family History  Problem Relation Age of Onset  . Diabetes Maternal Uncle   . Diabetes Maternal Grandmother     Social History   Social History  . Marital Status: Single    Spouse Name: N/A  . Number of Children: N/A  . Years of Education: N/A   Social History Main Topics  . Smoking status: Former Research scientist (life sciences)  . Smokeless tobacco: None  . Alcohol Use: Yes  . Drug Use: No  . Sexual Activity: Not Asked   Other Topics Concern  . None   Social History  Narrative   Review of Systems - See HPI.  All other ROS are negative.  BP 132/82 mmHg  Pulse 89  Temp(Src) 98.4 F (36.9 C) (Oral)  Resp 16  Ht 5' 7" (1.702 m)  Wt 214 lb (97.07 kg)  BMI 33.51 kg/m2  SpO2 99%  Physical Exam  Constitutional: He is oriented to person, place, and time and well-developed, well-nourished, and in no distress.  HENT:  Head: Normocephalic and atraumatic.  Eyes: Conjunctivae are normal.  Neck: Neck supple.  Cardiovascular: Normal rate, regular rhythm, normal heart sounds and intact distal pulses.   Pulmonary/Chest: Effort normal and breath sounds normal. No respiratory  distress. He has no wheezes. He has no rales. He exhibits no tenderness.  Neurological: He is alert and oriented to person, place, and time.  Skin: Skin is warm and dry. No rash noted.  Psychiatric: Affect normal.  Vitals reviewed.   Recent Results (from the past 2160 hour(s))  POC CBG, ED     Status: Abnormal   Collection Time: 11/14/15  1:37 PM  Result Value Ref Range   Glucose-Capillary 449 (H) 65 - 99 mg/dL  CBC with Differential     Status: Abnormal   Collection Time: 11/14/15  1:45 PM  Result Value Ref Range   WBC 9.8 4.0 - 10.5 K/uL   RBC 4.33 4.22 - 5.81 MIL/uL   Hemoglobin 12.0 (L) 13.0 - 17.0 g/dL   HCT 37.0 (L) 39.0 - 52.0 %   MCV 85.5 78.0 - 100.0 fL   MCH 27.7 26.0 - 34.0 pg   MCHC 32.4 30.0 - 36.0 g/dL   RDW 15.6 (H) 11.5 - 15.5 %   Platelets 160 150 - 400 K/uL   Neutrophils Relative % 77 %   Neutro Abs 7.6 1.7 - 7.7 K/uL   Lymphocytes Relative 14 %   Lymphs Abs 1.3 0.7 - 4.0 K/uL   Monocytes Relative 8 %   Monocytes Absolute 0.8 0.1 - 1.0 K/uL   Eosinophils Relative 1 %   Eosinophils Absolute 0.1 0.0 - 0.7 K/uL   Basophils Relative 0 %   Basophils Absolute 0.0 0.0 - 0.1 K/uL  Basic metabolic panel     Status: Abnormal   Collection Time: 11/14/15  1:45 PM  Result Value Ref Range   Sodium 129 (L) 135 - 145 mmol/L   Potassium 4.4 3.5 - 5.1 mmol/L   Chloride 96 (L) 101 - 111 mmol/L   CO2 25 22 - 32 mmol/L   Glucose, Bld 467 (H) 65 - 99 mg/dL   BUN 13 6 - 20 mg/dL   Creatinine, Ser 1.13 0.61 - 1.24 mg/dL   Calcium 9.7 8.9 - 10.3 mg/dL   GFR calc non Af Amer >60 >60 mL/min   GFR calc Af Amer >60 >60 mL/min    Comment: (NOTE) The eGFR has been calculated using the CKD EPI equation. This calculation has not been validated in all clinical situations. eGFR's persistently <60 mL/min signify possible Chronic Kidney Disease.    Anion gap 8 5 - 15  CBG monitoring, ED     Status: Abnormal   Collection Time: 11/14/15  2:53 PM  Result Value Ref Range    Glucose-Capillary 346 (H) 65 - 99 mg/dL  POCT Glucose (Device for Home Use)     Status: Abnormal   Collection Time: 11/17/15  4:40 PM  Result Value Ref Range   Glucose Fasting, POC  70 - 99 mg/dL   POC Glucose 329 (A) 70 -  99 mg/dl  Comp Met (CMET)     Status: Abnormal   Collection Time: 12/08/15  3:01 PM  Result Value Ref Range   Sodium 133 (L) 135 - 145 mEq/L   Potassium 4.1 3.5 - 5.1 mEq/L   Chloride 100 96 - 112 mEq/L   CO2 27 19 - 32 mEq/L   Glucose, Bld 166 (H) 70 - 99 mg/dL   BUN 22 6 - 23 mg/dL   Creatinine, Ser 1.15 0.40 - 1.50 mg/dL   Total Bilirubin 0.6 0.2 - 1.2 mg/dL   Alkaline Phosphatase 74 39 - 117 U/L   AST 30 0 - 37 U/L   ALT 34 0 - 53 U/L   Total Protein 8.9 (H) 6.0 - 8.3 g/dL   Albumin 3.9 3.5 - 5.2 g/dL   Calcium 10.0 8.4 - 10.5 mg/dL   GFR 85.29 >60.00 mL/min    Assessment/Plan: Diabetes mellitus type II, uncontrolled ([facility]) Fasting sugars at goal. Has been able to come off of insulin with continued glycemic control thanks to significant effort at therapeutic lifestyle changes. Will repeat labs today. He is to follow-up with Ophthalmology as scheduled. Will increase Gabapentin to 900 mg, 600 mg and 900 mg to help with nerve pain and sleep. FU with Pain management as scheduled.  Colon cancer screening Average risk. Asymptomatic. Order for screening colonoscopy placed.     Leeanne Rio, PA-C

## 2016-02-06 NOTE — Assessment & Plan Note (Signed)
Fasting sugars at goal. Has been able to come off of insulin with continued glycemic control thanks to significant effort at therapeutic lifestyle changes. Will repeat labs today. He is to follow-up with Ophthalmology as scheduled. Will increase Gabapentin to 900 mg, 600 mg and 900 mg to help with nerve pain and sleep. FU with Pain management as scheduled.

## 2016-02-06 NOTE — Patient Instructions (Addendum)
Please keep up with diet and exercise. Your sugar readings are fantastic! You should be commended for your hard work.  Please follow-up with your Pain Specialist as scheduled. Schedule an appointment with your Ophthalmologist for a diabetic eye examination.  You will be contacted to schedule a colonoscopy. You will also be contacted for a stress test of your heart.  We are increasing your Gabapentin to 3 capsules in the AM (900 mg), 2 midday (600 mg) and 3 at night (900 mg). Take as directed and let me know how this is helping

## 2016-02-06 NOTE — Progress Notes (Signed)
Pre visit review using our clinic review tool, if applicable. No additional management support is needed unless otherwise documented below in the visit note/SLS  

## 2016-02-06 NOTE — Assessment & Plan Note (Signed)
Average risk. Asymptomatic. Order for screening colonoscopy placed.

## 2016-02-10 ENCOUNTER — Ambulatory Visit (HOSPITAL_BASED_OUTPATIENT_CLINIC_OR_DEPARTMENT_OTHER): Payer: Medicare Other | Admitting: Physical Medicine & Rehabilitation

## 2016-02-10 ENCOUNTER — Encounter: Payer: Self-pay | Admitting: Physical Medicine & Rehabilitation

## 2016-02-10 ENCOUNTER — Encounter: Payer: Medicare Other | Attending: Physical Medicine & Rehabilitation

## 2016-02-10 VITALS — BP 136/84 | HR 94 | Resp 18

## 2016-02-10 DIAGNOSIS — E119 Type 2 diabetes mellitus without complications: Secondary | ICD-10-CM | POA: Insufficient documentation

## 2016-02-10 DIAGNOSIS — Z89511 Acquired absence of right leg below knee: Secondary | ICD-10-CM

## 2016-02-10 DIAGNOSIS — G546 Phantom limb syndrome with pain: Secondary | ICD-10-CM | POA: Diagnosis not present

## 2016-02-10 DIAGNOSIS — M47816 Spondylosis without myelopathy or radiculopathy, lumbar region: Secondary | ICD-10-CM | POA: Diagnosis not present

## 2016-02-10 DIAGNOSIS — I10 Essential (primary) hypertension: Secondary | ICD-10-CM | POA: Insufficient documentation

## 2016-02-10 NOTE — Progress Notes (Signed)
Subjective:    Patient ID: Justin Stone, male    DOB: 1961/04/25, 55 y.o.   MRN: [id number]  HPI   Right lumbar RFA L3,4,5 2/17  Left Lumbar L3,4,5 RFA  3/17  These procedures lasted 2-1/2-3 months. We discussed that usually they last around 6 months or longer.  Still having some phantom limb pain, has seen a PCP who advised increasing gabapentin to 900 mg 3 times a day but wanted input on this. We discussed that this would be acceptable  Pain Inventory Average Pain 7 Pain Right Now NA My pain is sharp, stabbing, tingling and aching  In the last 24 hours, has pain interfered with the following? General activity 6 Relation with others 10 Enjoyment of life 7 What TIME of day is your pain at its worst? morning, night Sleep (in general) Good  Pain is worse with: walking and sitting Pain improves with: medication and TENS Relief from Meds: 7  Mobility use a cane how many minutes can you walk? 2-3 ability to climb steps?  yes do you drive?  no Do you have any goals in this area?  yes  Function I need assistance with the following:  meal prep and household duties  Neuro/Psych numbness tingling trouble walking spasms anxiety  Prior Studies Any changes since last visit?  no  Physicians involved in your care Primary care Malva CoganCody Martin   Family History  Problem Relation Age of Onset  . Diabetes Maternal Uncle   . Diabetes Maternal Grandmother    Social History   Social History  . Marital Status: Single    Spouse Name: N/A  . Number of Children: N/A  . Years of Education: N/A   Social History Main Topics  . Smoking status: Former Games developermoker  . Smokeless tobacco: None  . Alcohol Use: Yes  . Drug Use: No  . Sexual Activity: Not Asked   Other Topics Concern  . None   Social History Narrative   History reviewed. No pertinent past surgical history. Past Medical History  Diagnosis Date  . Diabetes mellitus without complication (HCC)   . Hypertension     BP 136/84 mmHg  Pulse 94  Resp 18  SpO2 96%  Opioid Risk Score:   Fall Risk Score:  `1  Depression screen PHQ 2/9  [facility] 11/14/2015 10/14/2015 09/16/2015 08/26/2015 05/26/2015 05/06/2015  Decreased Interest 0 2 0 0 0 1  Down, Depressed, Hopeless 0 0 0 0 0 0  PHQ - 2 Score 0 2 0 0 0 1  Altered sleeping - 3 - - 1 1  Tired, decreased energy - 1 - - 0 0  Change in appetite - 0 - - 0 0  Feeling bad or failure about yourself  - 0 - - 0 0  Trouble concentrating - 0 - - 0 0  Moving slowly or fidgety/restless - 0 - - 0 0  Suicidal thoughts - 0 - - 0 0  PHQ-9 Score - 6 - - 1 2     Review of Systems  Musculoskeletal: Positive for gait problem.  Neurological: Positive for numbness.       Tingling  Spasms   Psychiatric/Behavioral: The patient is nervous/anxious.   All other systems reviewed and are negative.      Objective:   Physical Exam  Constitutional: He is oriented to person, place, and time. He appears well-developed and well-nourished.  HENT:  Head: Normocephalic and atraumatic.  Eyes: Conjunctivae and EOM are normal.  Pupils are equal, round, and reactive to light.  Neck: Normal range of motion.  Musculoskeletal:  Tenderness to palpation in the PSIS area bilaterally.  Negative straight leg raising test.  Right BK prosthetic with pistoning during gait  Neurological: He is alert and oriented to person, place, and time.  Psychiatric: He has a normal mood and affect.  Nursing note and vitals reviewed.         Assessment & Plan:  1. Lumbar spondylosis without myelopathy or radiculopathy, he had good response with radiofrequency neurotomy, although the duration is not as long as I would expect. We discussed that this can be repeated, but not until 6 months post. This would be in approximately 1 month for the right side and approximately 2 months for the left side. The patient will schedule this today. Follow-up with PCP on gabapentin okay from my  standpoint to increase to 900 3 times a day

## 2016-02-10 NOTE — Patient Instructions (Signed)
Will repeat burning of nerves on the right side in 1 month and on the left side in 2 months  Okay to increase gabapentin to 3 tablets

## 2016-02-11 ENCOUNTER — Encounter: Payer: Self-pay | Admitting: Internal Medicine

## 2016-02-12 ENCOUNTER — Telehealth: Payer: Self-pay | Admitting: Physician Assistant

## 2016-02-12 NOTE — Telephone Encounter (Signed)
Pt called in to request a call back from CMA. He says that she told him to call and speak with her directly. Informed pt that cma is out of the office today. He says that he's okay with that, if she could give him a call back tomorrow once in the office.

## 2016-02-13 MED ORDER — SILDENAFIL CITRATE 20 MG PO TABS: ORAL_TABLET | ORAL | Status: DC

## 2016-02-13 NOTE — Telephone Encounter (Signed)
Pt request lower dispense# on Rev[facility]; informed would send new prescription and for him to call and f/yu on delivery; understood & agreed/SLS 07/21

## 2016-02-13 NOTE — Addendum Note (Signed)
Addended by: Regis BillSCATES, Deandrea Rion L on: 02/13/2016 04:36 PM   Modules accepted: Orders

## 2016-02-17 ENCOUNTER — Other Ambulatory Visit: Payer: Self-pay | Admitting: *Deleted

## 2016-02-17 MED ORDER — SILDENAFIL CITRATE 20 MG PO TABS: ORAL_TABLET | ORAL | 0 refills | Status: DC

## 2016-02-20 ENCOUNTER — Other Ambulatory Visit: Payer: Self-pay | Admitting: Physician Assistant

## 2016-02-24 ENCOUNTER — Telehealth: Payer: Self-pay | Admitting: Physician Assistant

## 2016-02-24 DIAGNOSIS — G8929 Other chronic pain: Secondary | ICD-10-CM

## 2016-02-24 DIAGNOSIS — M545 Low back pain: Principal | ICD-10-CM

## 2016-02-24 NOTE — Telephone Encounter (Signed)
Refill request for 2 medications Oxycodone, morphine   Pt would like to pick up on 8/11 if possible, pt says that he will already be in the office.    Pt is requesting a call back    CB: [phone]

## 2016-02-25 ENCOUNTER — Other Ambulatory Visit: Payer: Self-pay | Admitting: Physician Assistant

## 2016-02-25 MED ORDER — CARISOPRODOL 350 MG PO TABS: 350.0000 mg | ORAL_TABLET | Freq: Three times a day (TID) | ORAL | 0 refills | Status: DC

## 2016-02-25 NOTE — Telephone Encounter (Signed)
Pt called back requesting RXs for oxycodone and morphine. He said he can pick up 8/11 but ok to post date for 8/14, pt requesting call back from Luis Llorons Torres to discuss meds.  [phone] ph#

## 2016-03-05 MED ORDER — MORPHINE SULFATE ER 15 MG PO TBCR: 15.0000 mg | EXTENDED_RELEASE_TABLET | Freq: Two times a day (BID) | ORAL | 0 refills | Status: DC

## 2016-03-05 MED ORDER — OXYCODONE HCL 10 MG PO TABS: 10.0000 mg | ORAL_TABLET | Freq: Two times a day (BID) | ORAL | 0 refills | Status: DC | PRN

## 2016-03-05 NOTE — Addendum Note (Signed)
Addended by: Regis BillSCATES, Damonie Ellenwood L on: 03/05/2016 12:41 PM   Modules accepted: Orders

## 2016-03-05 NOTE — Telephone Encounter (Signed)
Patient walked in to office without calling first for his Controlled substance prescriptions; asked him to have a seat in lobby and we would be with him as soon as we could between clinic patients. Printed scripts for provider, signed and forwarded to patient; he had other questions RE: his next appointment date and others, pt was told that he needed to speak with front desk about appt and have a phone note sent to me about other questions and I would get back to him as soon as I could, but that due to a full Am schedule I could not be away from the clinic at that time. Pt understood & agreed/SLS 08/11

## 2016-03-16 ENCOUNTER — Encounter: Payer: Medicare Other | Attending: Physical Medicine & Rehabilitation

## 2016-03-16 ENCOUNTER — Encounter: Payer: Self-pay | Admitting: Physical Medicine & Rehabilitation

## 2016-03-16 ENCOUNTER — Ambulatory Visit (HOSPITAL_BASED_OUTPATIENT_CLINIC_OR_DEPARTMENT_OTHER): Payer: Medicare Other | Admitting: Physical Medicine & Rehabilitation

## 2016-03-16 VITALS — BP 142/84 | HR 93 | Resp 17

## 2016-03-16 DIAGNOSIS — E119 Type 2 diabetes mellitus without complications: Secondary | ICD-10-CM | POA: Insufficient documentation

## 2016-03-16 DIAGNOSIS — I10 Essential (primary) hypertension: Secondary | ICD-10-CM | POA: Insufficient documentation

## 2016-03-16 DIAGNOSIS — M47816 Spondylosis without myelopathy or radiculopathy, lumbar region: Secondary | ICD-10-CM | POA: Diagnosis not present

## 2016-03-16 DIAGNOSIS — Z89511 Acquired absence of right leg below knee: Secondary | ICD-10-CM

## 2016-03-16 NOTE — Progress Notes (Signed)
  PROCEDURE RECORD [facility]   Name: Justin Stone DOB:1960-10-11 MRN: [id number]  Date:03/16/2016  Physician: Claudette LawsAndrew Kirsteins, MD    Nurse/CMA: Kathrine HaddockAmber Dory Demont, CMA  Allergies: No Known Allergies  Consent Signed: Yes.    Is patient diabetic? Yes.    CBG today? 98  Pregnant: No. LMP: No LMP for male patient. (age 54-55)  Anticoagulants: no Anti-inflammatory: no Antibiotics: no  Procedure: Right Radiofrequency Neurotomy   Position: Prone Start Time: 1130 End Time: 1157 Fluoro Time: 36 RN/CMA Herby Amick, CMA Beatrice Sehgal, CMA    Time 1041 1205    BP 142/84 157/87    Pulse 93 86    Respirations 17 17    O2 Sat 97 97    S/S 6 6    Pain Level 5/10 2/10     D/C home with public transportation, patient A & O X 3, D/C instructions reviewed, and sits independently.

## 2016-03-16 NOTE — Progress Notes (Signed)
RightL5 dorsal ramus., Right L4 and Right L3 medial branch radio frequency neurotomy under fluoroscopic guidance   Indication: Low back pain due to lumbar spondylosis which has been relieved on 2 occasions by greater than 50% by lumbar medial branch blocks at corresponding levels.  Informed consent was obtained after describing risks and benefits of the procedure with the patient, this includes bleeding, bruising, infection, paralysis and medication side effects. The patient wishes to proceed and has given written consent. The patient was placed in a prone position. The lumbar and sacral area was marked and prepped with Betadine. A 25-gauge 1-1/2 inch needle was inserted into the skin and subcutaneous tissue at 3 sites in one ML of 1% lidocaine was injected into each site. Then a 20-gauge 10 cm radio frequency needle with a 1 cm curved active tip was inserted targeting the Right S1 SAP/sacral ala junction. Bone contact was made and confirmed with lateral imaging. Sensory stimulation at 50 Hz followed by motor stimulation at 2 Hz confirm proper needle location followed by injection of one ML of the solution containing one ML of 4 mg per mL dexamethasone and 3 mL of 1% MPF lidocaine. Then the Right L5 SAP/transverse process junction was targeted. Bone contact was made and confirmed with lateral imaging. Sensory stimulation at 50 Hz followed by motor stimulation at 2 Hz confirm proper needle location followed by injection of one ML of the solution containing one ML of 4 mg per mL dexamethasone and 3 mL of 1% MPF lidocaine. Then the Right L4 SAP/transverse process junction was targeted. Bone contact was made and confirmed with lateral imaging. Sensory stimulation at 50 Hz followed by motor stimulation at 2 Hz confirm proper needle location followed by injection of one ML of the solution containing one ML of 4 mg per mL dexamethasone and 3 mL of 1% MPF lidocaine. Radio frequency lesion being at 80C for 90 seconds  was performed. Needles were removed. Post procedure instructions and vital signs were performed. Patient tolerated procedure well. Followup appointment was given. 

## 2016-03-16 NOTE — Patient Instructions (Signed)

## 2016-03-18 ENCOUNTER — Other Ambulatory Visit: Payer: Self-pay | Admitting: Physician Assistant

## 2016-03-19 ENCOUNTER — Encounter: Payer: Self-pay | Admitting: Physician Assistant

## 2016-03-19 NOTE — Telephone Encounter (Signed)
Rx request to pharmacy/SLS  

## 2016-03-19 NOTE — Telephone Encounter (Signed)
error:[id number] ° °

## 2016-03-22 ENCOUNTER — Encounter: Payer: Self-pay | Admitting: Physician Assistant

## 2016-03-22 ENCOUNTER — Ambulatory Visit (INDEPENDENT_AMBULATORY_CARE_PROVIDER_SITE_OTHER): Payer: Medicare Other | Admitting: Physician Assistant

## 2016-03-22 VITALS — BP 122/64 | HR 91 | Temp 98.2°F | Resp 16 | Ht 67.0 in | Wt 234.5 lb

## 2016-03-22 DIAGNOSIS — Z91048 Other nonmedicinal substance allergy status: Secondary | ICD-10-CM | POA: Diagnosis not present

## 2016-03-22 DIAGNOSIS — Z9109 Other allergy status, other than to drugs and biological substances: Secondary | ICD-10-CM

## 2016-03-22 MED ORDER — AZELASTINE HCL 0.05 % OP SOLN
1.0000 [drp] | Freq: Two times a day (BID) | OPHTHALMIC | 12 refills | Status: DC
Start: 2016-03-22 — End: 2017-08-18

## 2016-03-22 NOTE — Progress Notes (Signed)
Patient presents to clinic today c/o itchy watery eyes over the past several weeks. Denies pain or purulent drainage. Notes symptoms are worse at night and on waking. DeniesURI symptoms. Patient has history of environmental allergies. Denies pets in the home. Patient has not tried anything to alleviate the symptoms  Past Medical History:  Diagnosis Date  . Diabetes mellitus without complication ([place])   . Hypertension     Current Outpatient Prescriptions on File Prior to Visit  Medication Sig Dispense Refill  . ALPRAZolam (XANAX) 1 MG tablet TAKE 1 TABLET BY MOUTH EVERY NIGHT AT BEDTIME AS NEEDED 30 tablet 1  . amLODipine (NORVASC) 10 MG tablet TAKE 1 TABLET(10 MG) BY MOUTH DAILY 30 tablet 5  . B-D ULTRAFINE III SHORT PEN 31G X 8 MM MISC Reported on 12/19/2015  1  . carisoprodol (SOMA) 350 MG tablet Take 1 tablet (350 mg total) by mouth 3 (three) times daily. 90 tablet 0  . furosemide (LASIX) 40 MG tablet Take 1 tablet (40 mg total) by mouth daily. 90 tablet 0  . gabapentin (NEURONTIN) 300 MG capsule TAKE 2 CAPSULES BY MOUTH THREE TIMES DAILY 540 capsule 3  . Insulin Syringe-Needle U-100 (INSULIN SYRINGE 1CC/31GX5/16") 31G X 5/16" 1 ML MISC Reported on 12/19/2015  0  . LANTUS SOLOSTAR 100 UNIT/ML Solostar Pen Inject 10 Units into the skin at bedtime. Sliding scale 15 mL 1  . lisinopril (PRINIVIL,ZESTRIL) 10 MG tablet TAKE 1 TABLET BY MOUTH DAILY 90 tablet 1  . metFORMIN (GLUCOPHAGE) 500 MG tablet TAKE 1 TABLET(500 MG) BY MOUTH TWICE DAILY WITH A MEAL 60 tablet 0  . morphine (MS CONTIN) 15 MG 12 hr tablet Take 1 tablet (15 mg total) by mouth every 12 (twelve) hours. 60 tablet 0  . ONE TOUCH ULTRA TEST test strip 1 each by Other route 2 (two) times daily. for testing 100 each 3  . ONETOUCH DELICA LANCETS 81L MISC Use to check blood sugar twice daily. 100 each 5  . Oxycodone HCl 10 MG TABS Take 1 tablet (10 mg total) by mouth 2 (two) times daily as needed. 70 tablet 0  . sildenafil (REVATIO)  20 MG tablet Take 1 tablet by mouth as directed 30 minutes before intercourse 50 tablet 0  . simvastatin (ZOCOR) 10 MG tablet TAKE 1 TABLET BY MOUTH AT BEDTIME 90 tablet 1  . venlafaxine (EFFEXOR) 75 MG tablet TAKE 1 TABLET(75 MG) BY MOUTH TWICE DAILY 180 tablet 0   No current facility-administered medications on file prior to visit.     No Known Allergies  Family History  Problem Relation Age of Onset  . Diabetes Maternal Uncle   . Diabetes Maternal Grandmother     Social History   Social History  . Marital status: Single    Spouse name: N/A  . Number of children: N/A  . Years of education: N/A   Social History Main Topics  . Smoking status: Former Research scientist (life sciences)  . Smokeless tobacco: Former Systems developer  . Alcohol use Yes  . Drug use: No  . Sexual activity: Not Asked   Other Topics Concern  . None   Social History Narrative  . None    Review of Systems - See HPI.  All other ROS are negative.  BP 122/64 (BP Location: Right Arm, Patient Position: Sitting, Cuff Size: Large)   Pulse 91   Temp 98.2 F (36.8 C) (Oral)   Resp 16   Ht 5' 7"  (1.702 m)   Wt 234 lb  8 oz (106.4 kg)   SpO2 97%   BMI 36.73 kg/m   Physical Exam  Constitutional: He is oriented to person, place, and time and well-developed, well-nourished, and in no distress.  HENT:  Head: Normocephalic and atraumatic.  Eyes: Conjunctivae and EOM are normal. Pupils are equal, round, and reactive to light. Right eye exhibits no discharge and no hordeolum. No foreign body present in the right eye. Left eye exhibits no discharge and no hordeolum. No foreign body present in the left eye. Right conjunctiva is not injected. Right conjunctiva has no hemorrhage. Left conjunctiva is not injected. Left conjunctiva has no hemorrhage.  Neck: Neck supple.  Cardiovascular: Normal rate, regular rhythm, normal heart sounds and intact distal pulses.   Pulmonary/Chest: Effort normal and breath sounds normal. No respiratory distress. He has  no wheezes. He has no rales. He exhibits no tenderness.  Neurological: He is alert and oriented to person, place, and time.  Skin: Skin is warm and dry. No rash noted.  Psychiatric: Affect normal.  Vitals reviewed.   Recent Results (from the past 2160 hour(s))  Hemoglobin A1c     Status: Abnormal   Collection Time: 02/06/16 10:02 AM  Result Value Ref Range   Hgb A1c MFr Bld 4.4 (L) 4.6 - 6.5 %    Comment: Glycemic Control Guidelines for People with Diabetes:Non Diabetic:  <6%Goal of Therapy: <7%Additional Action Suggested:  >8%   Comp Met (CMET)     Status: Abnormal   Collection Time: 02/06/16 10:02 AM  Result Value Ref Range   Sodium 137 135 - 145 mEq/L   Potassium 4.3 3.5 - 5.1 mEq/L   Chloride 103 96 - 112 mEq/L   CO2 26 19 - 32 mEq/L   Glucose, Bld 134 (H) 70 - 99 mg/dL   BUN 15 6 - 23 mg/dL   Creatinine, Ser 1.15 0.40 - 1.50 mg/dL   Total Bilirubin 0.4 0.2 - 1.2 mg/dL   Alkaline Phosphatase 67 39 - 117 U/L   AST 60 (H) 0 - 37 U/L   ALT 56 (H) 0 - 53 U/L   Total Protein 9.1 (H) 6.0 - 8.3 g/dL   Albumin 4.3 3.5 - 5.2 g/dL   Calcium 10.0 8.4 - 10.5 mg/dL   GFR 85.24 >60.00 mL/min  Lipid panel     Status: None   Collection Time: 02/06/16 10:02 AM  Result Value Ref Range   Cholesterol 148 0 - 200 mg/dL    Comment: ATP III Classification       Desirable:  < 200 mg/dL               Borderline High:  200 - 239 mg/dL          High:  > = 240 mg/dL   Triglycerides 46.0 0.0 - 149.0 mg/dL    Comment: Normal:  <150 mg/dLBorderline High:  150 - 199 mg/dL   HDL 59.30 >39.00 mg/dL   VLDL 9.2 0.0 - 40.0 mg/dL   LDL Cholesterol 79 0 - 99 mg/dL   Total CHOL/HDL Ratio 2     Comment:                Men          Women1/2 Average Risk     3.4          3.3Average Risk          5.0          4.42X Average Risk  9.6          7.13X Average Risk          15.0          11.0                       NonHDL 88.66     Comment: NOTE:  Non-HDL goal should be 30 mg/dL higher than patient's LDL goal  (i.e. LDL goal of < 70 mg/dL, would have non-HDL goal of < 100 mg/dL)    Assessment/Plan: 1. Environmental allergies Will start Optivar. Recommended washing bed linens. Place air purifier in the bedroom. Follow-up if not improving.  - azelastine (OPTIVAR) 0.05 % ophthalmic solution; Place 1 drop into both eyes 2 (two) times daily.  Dispense: 6 mL; Refill: 12   Ra[place]ord Noble [place], [place]

## 2016-03-22 NOTE — Patient Instructions (Signed)
Please apply allergy eye drops as directed. Avoid directly touching the eyes as this increases risk of trauma and infection. Warm compresses can be beneficial when symptoms are significant. Get an air purifier and place in the bedroom. Make sure linens and pillowcase especially are clean.  Follow-up if not resolving.

## 2016-03-30 ENCOUNTER — Other Ambulatory Visit: Payer: Self-pay | Admitting: Physician Assistant

## 2016-03-30 NOTE — Telephone Encounter (Signed)
Last seen 03/22/16 Last filled #30 and 1 refill Please advise----PC

## 2016-03-30 NOTE — Telephone Encounter (Signed)
Rx request faxed to pharmacy/SLS 09/05

## 2016-04-01 ENCOUNTER — Other Ambulatory Visit: Payer: Self-pay | Admitting: Physician Assistant

## 2016-04-01 DIAGNOSIS — M545 Low back pain, unspecified: Secondary | ICD-10-CM

## 2016-04-01 DIAGNOSIS — G8929 Other chronic pain: Secondary | ICD-10-CM

## 2016-04-01 MED ORDER — CARISOPRODOL 350 MG PO TABS
350.0000 mg | ORAL_TABLET | Freq: Three times a day (TID) | ORAL | 0 refills | Status: DC
Start: 2016-04-01 — End: 2016-06-30

## 2016-04-01 NOTE — Telephone Encounter (Signed)
Requesting:  Carisoprodol 350 mg 1 tablet tid daily Contract   Signed on 10/22/2015 UDS   Moderate-due on 01/02/2016 Last OV  03/22/2016 Last Refill   #90 with 0 refills on 02/25/2016  Please Advise

## 2016-04-01 NOTE — Telephone Encounter (Signed)
Requesting:   Oxycodone and morphine Contract  Signed on 10/22/2015 UDS  Moderate risk, next was due on 6//29/2017 Last OV  03/22/2016 Last Refill  Oxycodone--#70 with 0 refills on 03/05/2016                    Morphine--#60  With 0 refills on 03/05/2016  Please Advise

## 2016-04-01 NOTE — Telephone Encounter (Signed)
Caller name: Relationship to patient: Self Can be reached: [phone]    Reason for call: Request refill on morphine (MS CONTIN) 15 MG 12 hr tablet [[id number] and Oxycodone HCl 10 MG TABS [[id number] Will pick up Rx on 9/11.  Plse call patient and let him know when ready for pick up

## 2016-04-02 MED ORDER — OXYCODONE HCL 10 MG PO TABS: 10.0000 mg | ORAL_TABLET | Freq: Two times a day (BID) | ORAL | 0 refills | Status: DC | PRN

## 2016-04-02 MED ORDER — MORPHINE SULFATE ER 15 MG PO TBCR: 15.0000 mg | EXTENDED_RELEASE_TABLET | Freq: Two times a day (BID) | ORAL | 0 refills | Status: DC

## 2016-04-02 NOTE — Telephone Encounter (Signed)
Spoke with caller on return call, as he was giving Fransico MeadowJennifer [answered call] false information, such as "he was a doctor calling"; patient was informed to cease such antics with employees, as he seemed to be inebriated of some type and was crossing the inappropriate line with his comments ["I'm in my bedroom and I am really in to you"] and other in that forum. Patient was informed not to come in to the office today [in his apparent intoxicated form], and to wait until Monday to pick-up his prescriptions. Patient was once again informed to correct his behavior in the future and he stated that he "really admired me"/SLS 09/08

## 2016-04-02 NOTE — Telephone Encounter (Signed)
LMOM with contact name and number [for return call, if needed] RE: requested Rx ready for p/u during regular business hours per provider instructions/SLS 09/08

## 2016-04-05 ENCOUNTER — Encounter: Payer: Self-pay | Admitting: Physician Assistant

## 2016-04-05 ENCOUNTER — Ambulatory Visit (AMBULATORY_SURGERY_CENTER): Payer: Self-pay | Admitting: *Deleted

## 2016-04-05 VITALS — Ht 67.0 in | Wt 220.0 lb

## 2016-04-05 DIAGNOSIS — Z79891 Long term (current) use of opiate analgesic: Secondary | ICD-10-CM | POA: Diagnosis not present

## 2016-04-05 DIAGNOSIS — Z1211 Encounter for screening for malignant neoplasm of colon: Secondary | ICD-10-CM

## 2016-04-05 DIAGNOSIS — Z79899 Other long term (current) drug therapy: Secondary | ICD-10-CM | POA: Diagnosis not present

## 2016-04-05 MED ORDER — NA SULFATE-K SULFATE-MG SULF 17.5-3.13-1.6 GM/177ML PO SOLN
1.0000 | Freq: Once | ORAL | 0 refills | Status: AC
Start: 2016-04-05 — End: 2016-04-05

## 2016-04-05 NOTE — Progress Notes (Signed)
No egg or soy allergy known to patient  No issues with past sedation with any surgeries  or procedures, no intubation problems  No diet pills per patient No home 02 use per patient  No blood thinners per patient  Pt denies issues with constipation  No A fib or A flutter   emmi declined'   

## 2016-04-06 ENCOUNTER — Telehealth: Payer: Self-pay | Admitting: *Deleted

## 2016-04-06 NOTE — Telephone Encounter (Signed)
[facility] states that patient is in need of complete physical with fasting labs/SLS 09/12 Please call patient and have him schedule Medicare Wellness with Eber Jonesarolyn and then a CPE with fasting labs around one week after/SLS  Thanks.

## 2016-04-13 ENCOUNTER — Encounter: Payer: Self-pay | Admitting: Physical Medicine & Rehabilitation

## 2016-04-13 ENCOUNTER — Other Ambulatory Visit: Payer: Self-pay | Admitting: Physician Assistant

## 2016-04-13 ENCOUNTER — Encounter: Payer: Medicare Other | Attending: Physical Medicine & Rehabilitation

## 2016-04-13 ENCOUNTER — Ambulatory Visit (HOSPITAL_BASED_OUTPATIENT_CLINIC_OR_DEPARTMENT_OTHER): Payer: Medicare Other | Admitting: Physical Medicine & Rehabilitation

## 2016-04-13 VITALS — BP 135/81 | HR 93 | Resp 14

## 2016-04-13 DIAGNOSIS — M47816 Spondylosis without myelopathy or radiculopathy, lumbar region: Secondary | ICD-10-CM | POA: Diagnosis not present

## 2016-04-13 DIAGNOSIS — E119 Type 2 diabetes mellitus without complications: Secondary | ICD-10-CM | POA: Diagnosis not present

## 2016-04-13 DIAGNOSIS — I10 Essential (primary) hypertension: Secondary | ICD-10-CM | POA: Insufficient documentation

## 2016-04-13 NOTE — Telephone Encounter (Signed)
Medication Detail   Disp Refills Start End   furosemide (LASIX) 40 MG tablet 90 tablet 0 01/26/2016    Sig - Route: Take 1 tablet (40 mg total) by mouth daily. - Oral   E-Prescribing Status: Receipt confirmed by pharmacy (01/26/2016 8:20 AM EDT)   Pharmacy  [id number] - [place], [place] - [address] [facility] [address]   Too Soon for refill request per provider; Denied/SLS 09/19

## 2016-04-13 NOTE — Progress Notes (Signed)
Left L5 dorsal ramus., left L4 and left L3 medial branch radio frequency neurotomy under fluoroscopic guidance  Indication: Low back pain due to lumbar spondylosis which has been relieved on 2 occasions by greater than 50% by lumbar medial branch blocks at corresponding levels.  Informed consent was obtained after describing risks and benefits of the procedure with the patient, this includes bleeding, bruising, infection, paralysis and medication side effects. The patient wishes to proceed and has given written consent. The patient was placed in a prone position. The lumbar and sacral area was marked and prepped with Betadine. A 25-gauge 1-1/2 inch needle was inserted into the skin and subcutaneous tissue at 3 sites in one ML of 1% lidocaine was injected into each site. Then a 20-gauge 10 cm radio frequency needle with a 1 cm curved active tip was inserted targeting the left S1 SAP/sacral ala junction. Bone contact was made and confirmed with lateral imaging. Sensory stimulation at 50 Hz followed by motor stimulation at 2 Hz confirm proper needle location followed by injection of one ML of the solution containing one ML of 4 mg per mL dexamethasone and 3 mL of 1% MPF lidocaine. Then the left L5 SAP/transverse process junction was targeted. Bone contact was made and confirmed with lateral imaging. Sensory stimulation at 50 Hz followed by motor stimulation at 2 Hz confirm proper needle location followed by injection of one ML of the solution containing one ML of 4 mg per mL dexamethasone and 3 mL of 1% MPF lidocaine. Then the left L4 SAP/transverse process junction was targeted. Bone contact was made and confirmed with lateral imaging. Sensory stimulation at 50 Hz followed by motor stimulation at 2 Hz confirm proper needle location followed by injection of one ML of the solution containing one ML of 4 mg per mL dexamethasone and 3 mL of 1% MPF lidocaine. Radio frequency lesion being at 80C for 90 seconds was  performed. Needles were removed. Post procedure instructions and vital signs were performed. Patient tolerated procedure well. Followup appointment was given.      

## 2016-04-13 NOTE — Telephone Encounter (Signed)
Patient scheduled cpe/medicare wellness for 05/11/2016 at 9:30am

## 2016-04-13 NOTE — Progress Notes (Signed)
  PROCEDURE RECORD [facility]   Name: Justin Stone DOB:August 30, 1960 MRN: [id number]  Date:04/13/2016  Physician: Claudette LawsAndrew Kirsteins, MD    Nurse/CMA: Setareh Rom, CMA  Allergies: No Known Allergies  Consent Signed: Yes.    Is patient diabetic? Yes.    CBG today? 126  Pregnant: No. LMP: No LMP for male patient. (age 54-55)  Anticoagulants: no Anti-inflammatory: no Antibiotics: no  Procedure: left L3,4,5 radiofrequency neurotomy Position: Prone Start Time:  11:27am     End Time: 11:45am  Fluoro Time: 29  RN/CMA Jodye Scali, CMA Eathon Valade, CMA    Time 11:10am 11:50am    BP 135/81 151/92    Pulse 93 92    Respirations 14 14    O2 Sat 95 96    S/S 6 6    Pain Level 7/10 2/10     D/C home with [employer], patient A & O X 3, D/C instructions reviewed, and sits independently.

## 2016-04-13 NOTE — Patient Instructions (Signed)
You had a radio frequency procedure today This was done to alleviate joint pain in your lumbar area We injected a combination of dexamethasone which is a steroid as well as lidocaine which is a local anesthetic. Dexamethasone made increased blood sugars you are diabetic You may experience soreness at the injection sites. You may also experienced some irritation of the nerves that were heated I'm recommending ice for 30 minutes every 2 hours as needed for the next 24-48 hours   

## 2016-04-13 NOTE — Telephone Encounter (Signed)
Left message on voicemail for patient to call and schedule Medicare Wellness and CPE

## 2016-04-13 NOTE — Telephone Encounter (Signed)
Noted, provider aware/SLS 09/19

## 2016-04-16 ENCOUNTER — Telehealth: Payer: Self-pay | Admitting: Physician Assistant

## 2016-04-16 ENCOUNTER — Telehealth: Payer: Self-pay | Admitting: *Deleted

## 2016-04-16 ENCOUNTER — Encounter: Payer: Self-pay | Admitting: Gastroenterology

## 2016-04-16 NOTE — Telephone Encounter (Signed)
Recd call inquiring about medical records and asked them to fax.

## 2016-04-16 NOTE — Telephone Encounter (Signed)
    Hi Justin Stone,   This patient called the service last night with some question or problem related to his bowel prep. Said he was at the store and could not find it? Looks like Suprep script was sent on 9/11 for his colonoscopy on 9/25. Maybe he misunderstood something. I tried to return his call, but he did not pick up and it was after hours.   Please call him Friday and help him figure this out.   Thanks very much.   HD   I noticed this in [facility]. [facility]'s in basket and called the patient to check on him.  All questions answered.  Pt does have his Suprep from the pharmacy and states he understands the instructions.

## 2016-04-18 ENCOUNTER — Other Ambulatory Visit: Payer: Self-pay | Admitting: Physician Assistant

## 2016-04-19 ENCOUNTER — Encounter: Payer: Medicare Other | Admitting: Gastroenterology

## 2016-04-19 MED ORDER — FUROSEMIDE 40 MG PO TABS: 40.0000 mg | ORAL_TABLET | Freq: Every day | ORAL | 0 refills | Status: DC

## 2016-04-19 NOTE — Telephone Encounter (Signed)
I understand.    It is not clear why that message to me was attached to a refill request for furosemide (perhaps a mistake), but that is a medicine I do not manage for him.

## 2016-04-19 NOTE — Addendum Note (Signed)
Addended by: Crissie SicklesARTER, Mohanad Carsten A on: 04/19/2016 01:39 PM   Modules accepted: Orders

## 2016-04-21 ENCOUNTER — Telehealth: Payer: Self-pay | Admitting: Physician Assistant

## 2016-04-21 NOTE — Telephone Encounter (Signed)
UDS results received 04/07/16 -- Patient tested positive for prescribed Morphine but negative for prescribed Oxycodone.  Also tested positive for THC.   [place] Database reviewed -- picking up both medications every 30 days without fail. Both Rx are for 30-day supplies each.   [facility] not to [facility]. 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 If there are any further violations of the contract, we will no longer be able to prescribe controlled medications to him

## 2016-04-21 NOTE — Telephone Encounter (Signed)
Patient is calling regarding his prosthetic foot. He said that he spoke with [name] this morning and she informed him that we have not received the package yet. He is requesting that we call Aggie Cosierheresa at [facility] to find out what we need in order to get this going for him.   Aggie Cosierheresa @ [facility] [phone]  Patient Relation: Self Patient Phone:[phone]

## 2016-04-21 NOTE — Telephone Encounter (Signed)
This was not the conversation had with patient when I called him this Am to discuss his CSC violations; I was on telephone with patient for >20 minutes. Patient did inquire as to if we had received paperwork from [facility] [employer] [for Medicare/Medicaid package] regarding a new prosthesis. Patient was informed: 1) when the paperwork is received at the office, it will be forwarded to our paperwork personnel 2) our paperwork personnel will complete as much as possible and then forward to provider 3) provider will then complete the paperwork and sign; we will call patient if any information is needed that cannot be obtained from his EMR for the completion of paperwork 4) when paperwork is completed, it will then be Faxed and/or Mailed [whichever is given on instructions], pt will be informed and given the option to p/u or mail [or fax] his copy to him and one will be sent to be scanned in his chart We will follow these standard steps and contact patient back as informed when completed and/or if needed prior to completion/SLS 09/27  

## 2016-04-21 NOTE — Telephone Encounter (Signed)
This was not the conversation had with patient when I called him this Am to discuss his CSC violations; I was on telephone with patient for >20 minutes. Patient did inquire as to if we had received paperwork from [employer] [for Medicare/Medicaid package] regarding a new prosthesis. Patient was informed: 1) when the paperwork is received at the office, it will be forwarded to our paperwork personnel 2) our paperwork personnel will complete as much as possible and then forward to provider 3) provider will then complete the paperwork and sign; we will call patient if any information is needed that cannot be obtained from his EMR for the completion of paperwork 4) when paperwork is completed, it will then be Faxed and/or Mailed [whichever is given on instructions], pt will be informed and given the option to p/u or mail [or fax] his copy to him and one will be sent to be scanned in his chart We will follow these standard steps and contact patient back as informed when completed and/or if needed prior to completion/SLS 09/27

## 2016-04-21 NOTE — Telephone Encounter (Signed)
Formed received by email from [place] placed in paperwork bin at the front desk.

## 2016-04-22 ENCOUNTER — Other Ambulatory Visit: Payer: Self-pay | Admitting: Physician Assistant

## 2016-04-23 NOTE — Telephone Encounter (Signed)
Rx faxed to [facility] [address] [phone]

## 2016-04-23 NOTE — Telephone Encounter (Signed)
Last seen 03/22/16 Last filled 03/30/16 #30-0 refills Please advise   PC

## 2016-04-23 NOTE — Telephone Encounter (Signed)
Rx request to pharmacy/SLS  

## 2016-04-25 IMAGING — CR DG LUMBAR SPINE 2-3V
3 series · 3 of 3 positions shown · non-contrast
Comparison: None.

CLINICAL DATA: Chronic back pain especially when sitting and
standing.

EXAM:
LUMBAR SPINE - 2-3 VIEW

[t l-spine a.p.]
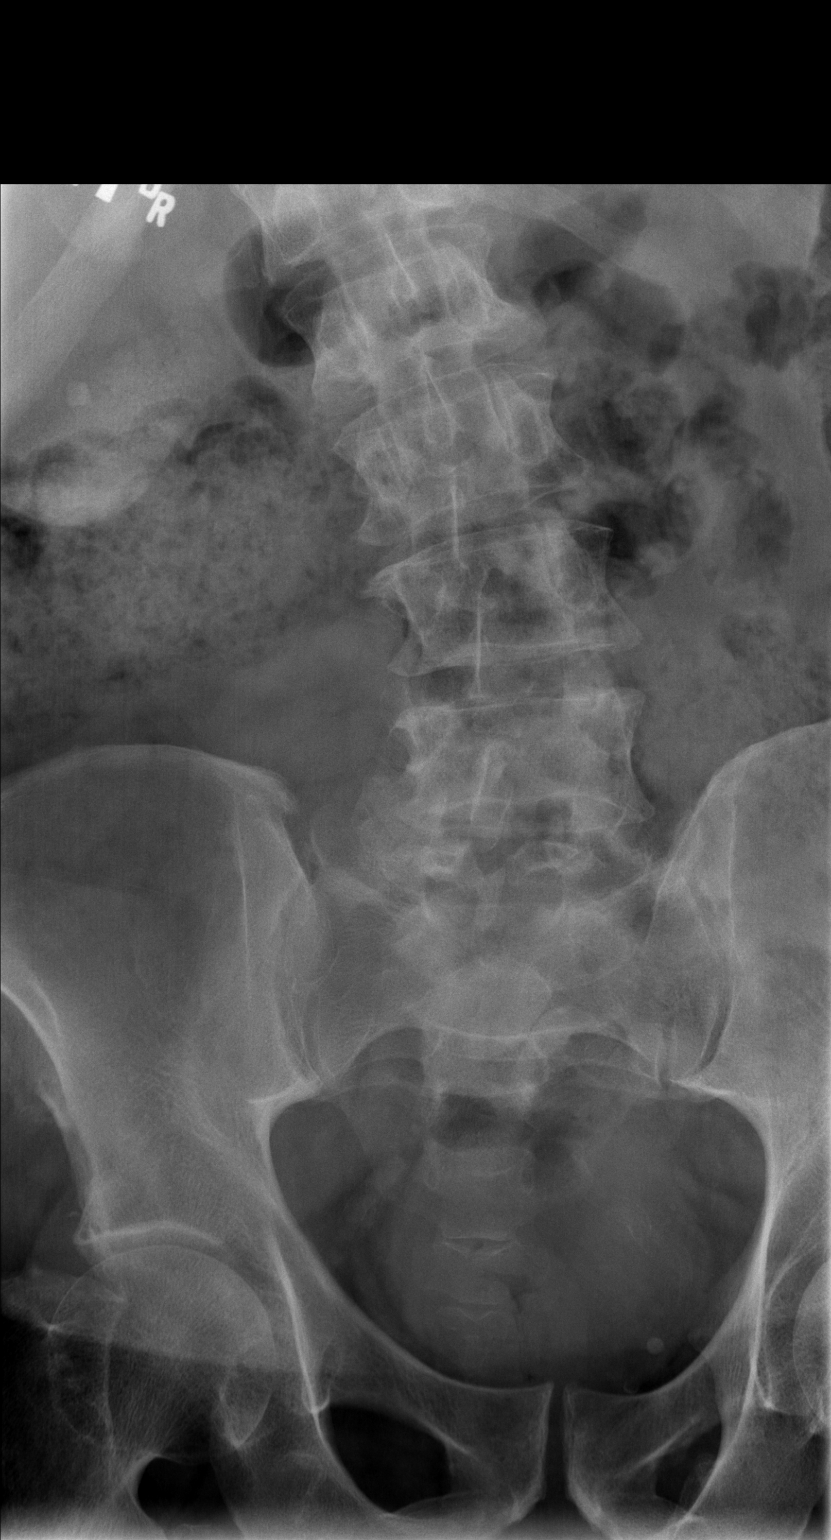

[t l-spine lat]
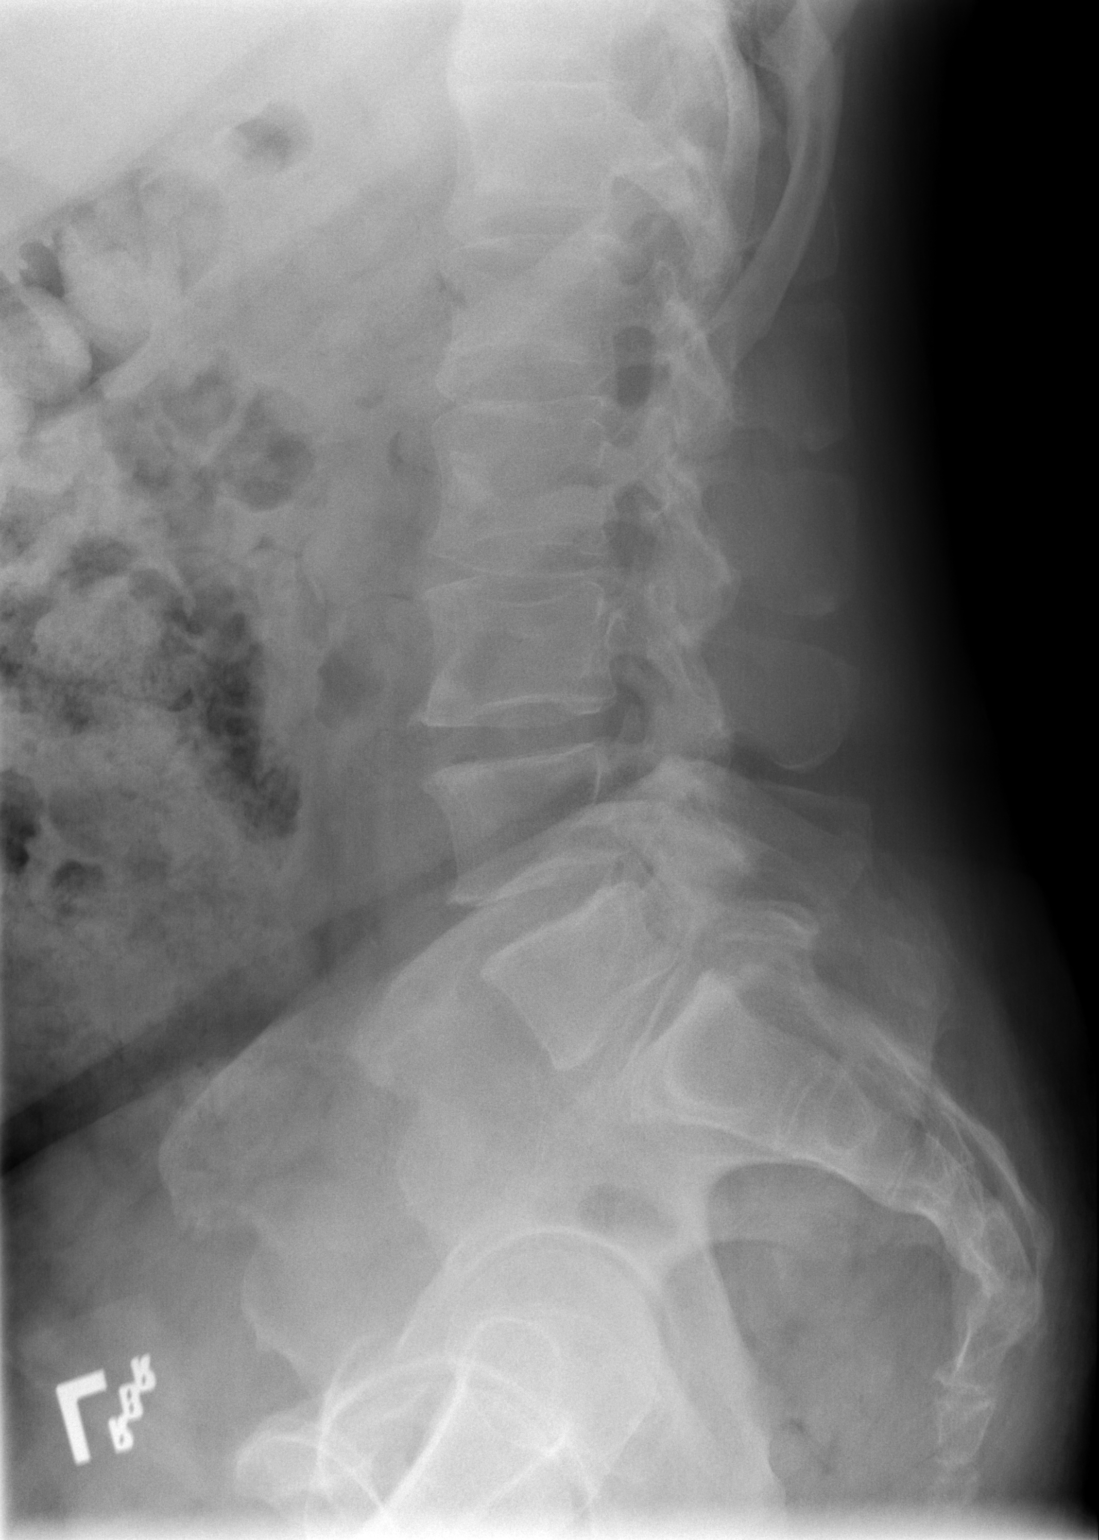

[t l-spine l5-s1 spot]
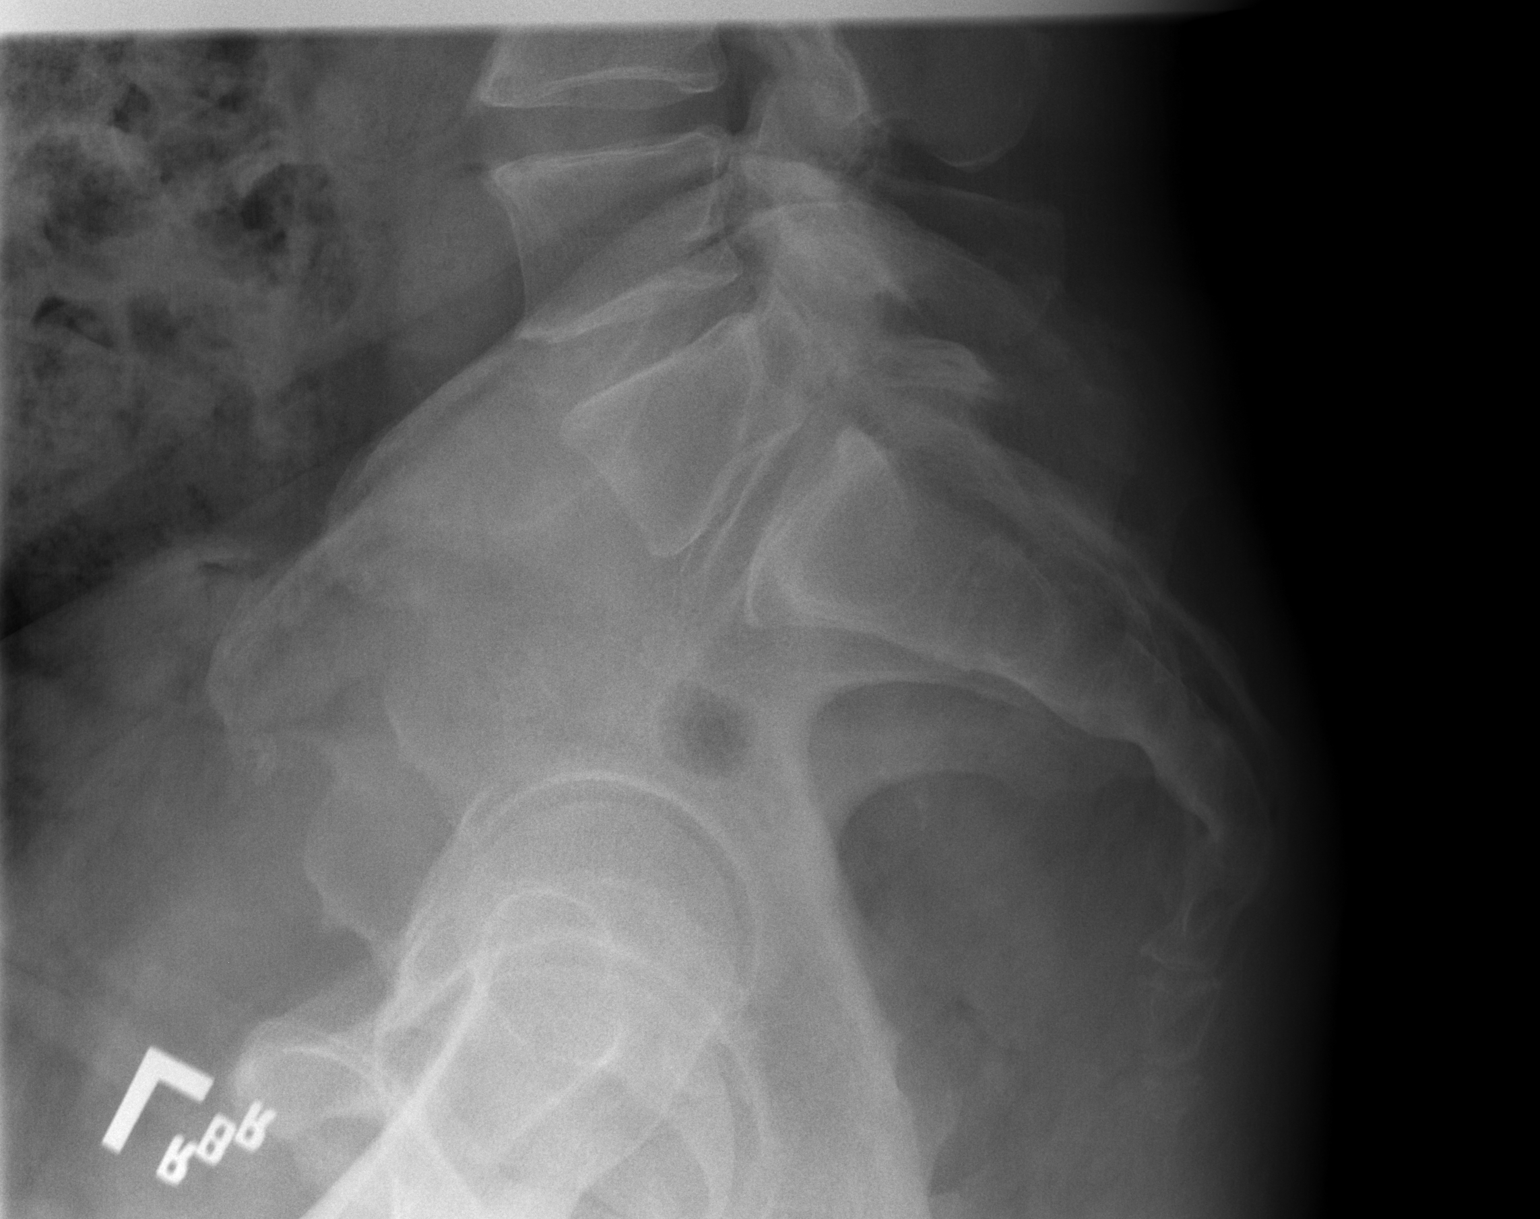

[3 of 3 positions shown; findings below may reference images not displayed]

FINDINGS: Mild levoscoliosis in the lower lumbar spine. Disc spaces are
maintained. Degenerative facet disease throughout the lumbar spine,
most pronounced in the lower lumbar spine. Suspect bilateral L5 pars
defects. Slight anterolisthesis of L5 on S1 of 3 mm. No fracture. SI
joints are symmetric and unremarkable.
IMPRESSION: Mild degenerative facet disease, most pronounced in the lower lumbar
spine.

Suspect L5 pars defects.  Slight anterolisthesis.

## 2016-04-25 IMAGING — CR DG KNEE COMPLETE 4+V*R*
4 series · 4 of 4 positions shown · non-contrast
Comparison: None.

CLINICAL DATA: Right lateral knee pain. Prior below-knee
amputation.

EXAM:
RIGHT KNEE - COMPLETE 4+ VIEW

[t knee ap right]
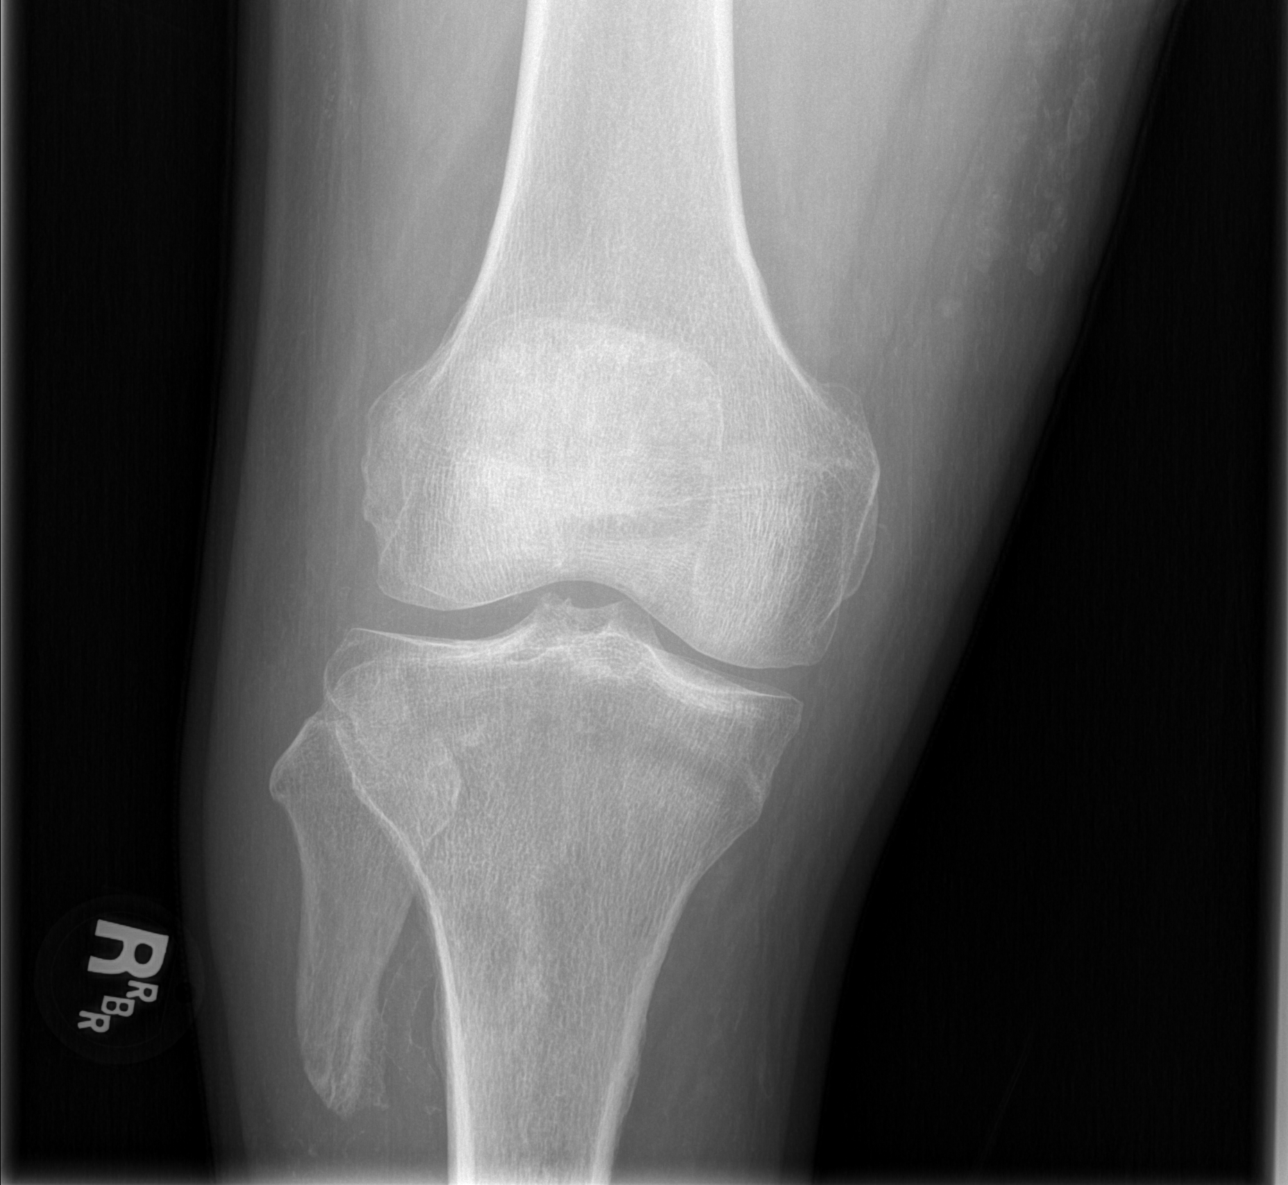

[t knee oblique right (1 of 2)]
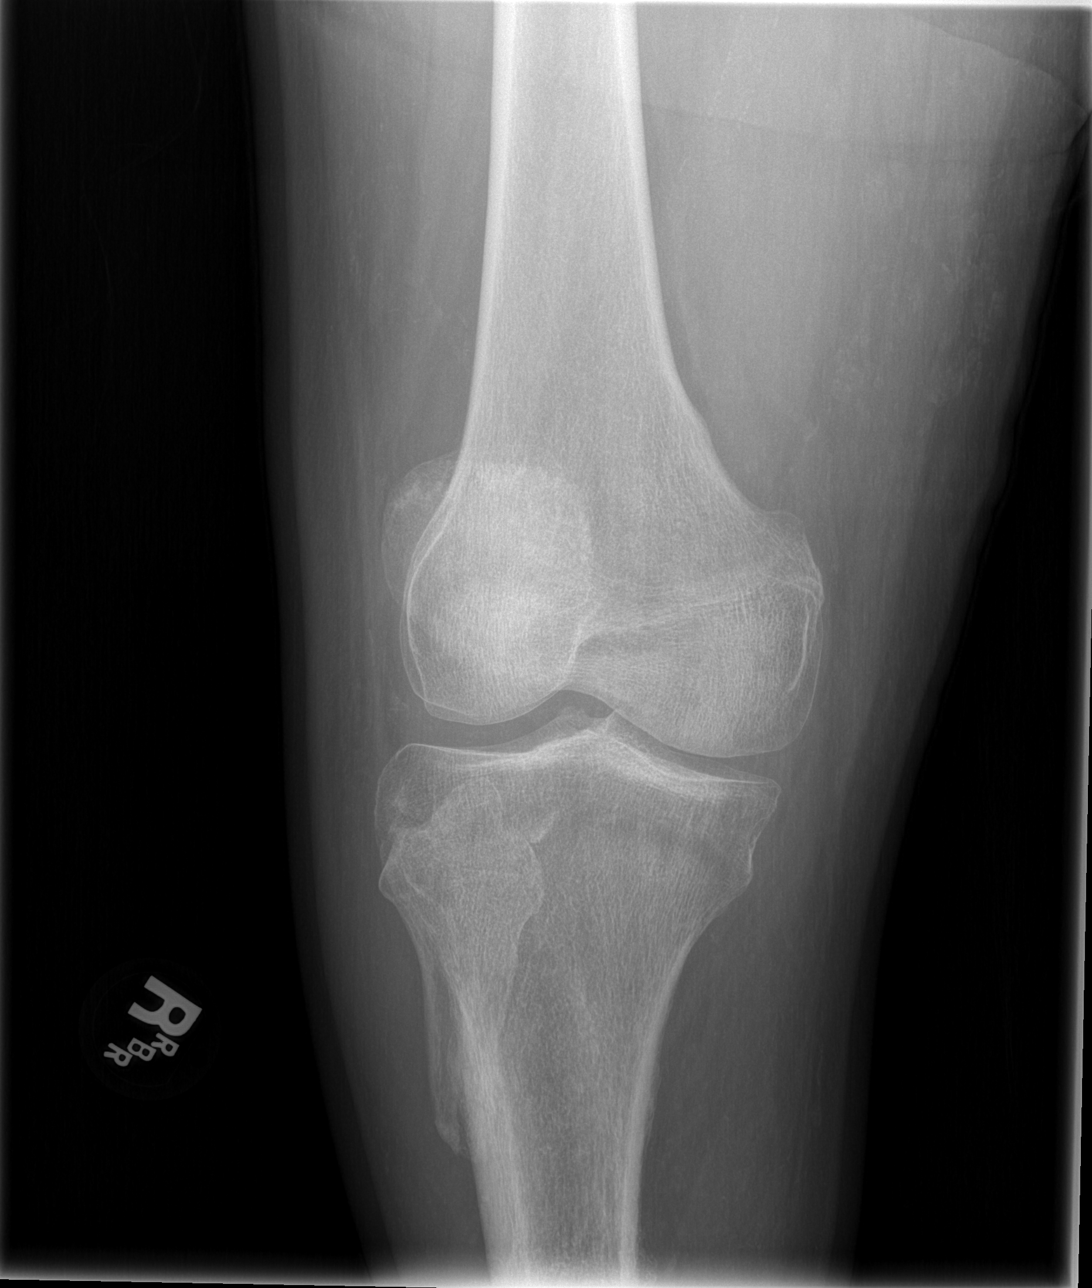

[t knee oblique right (2 of 2)]
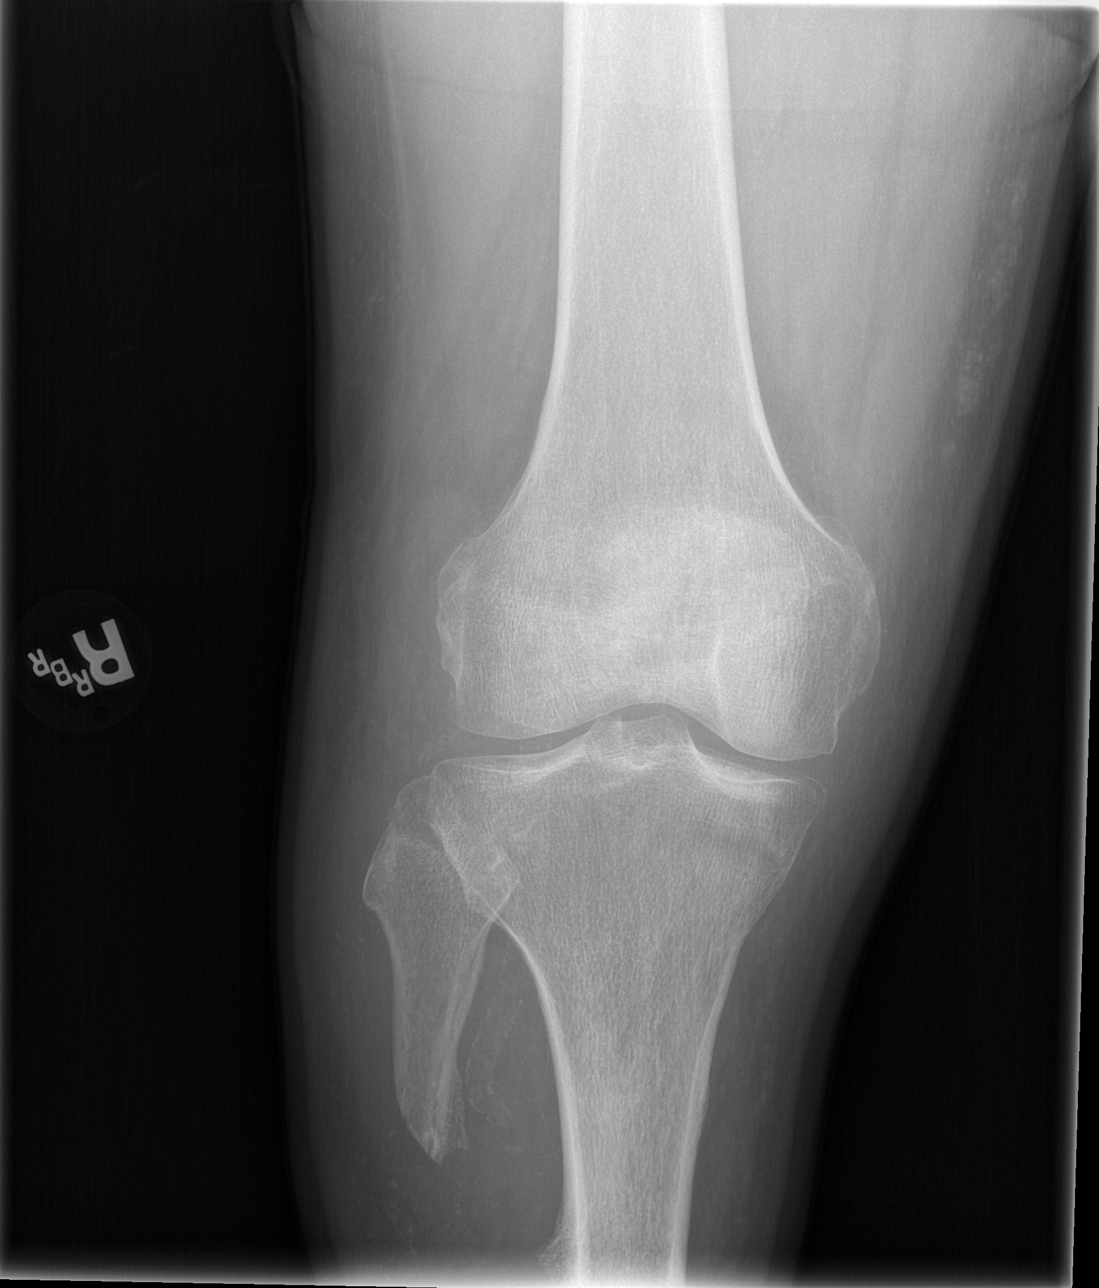

[t knee lat right]
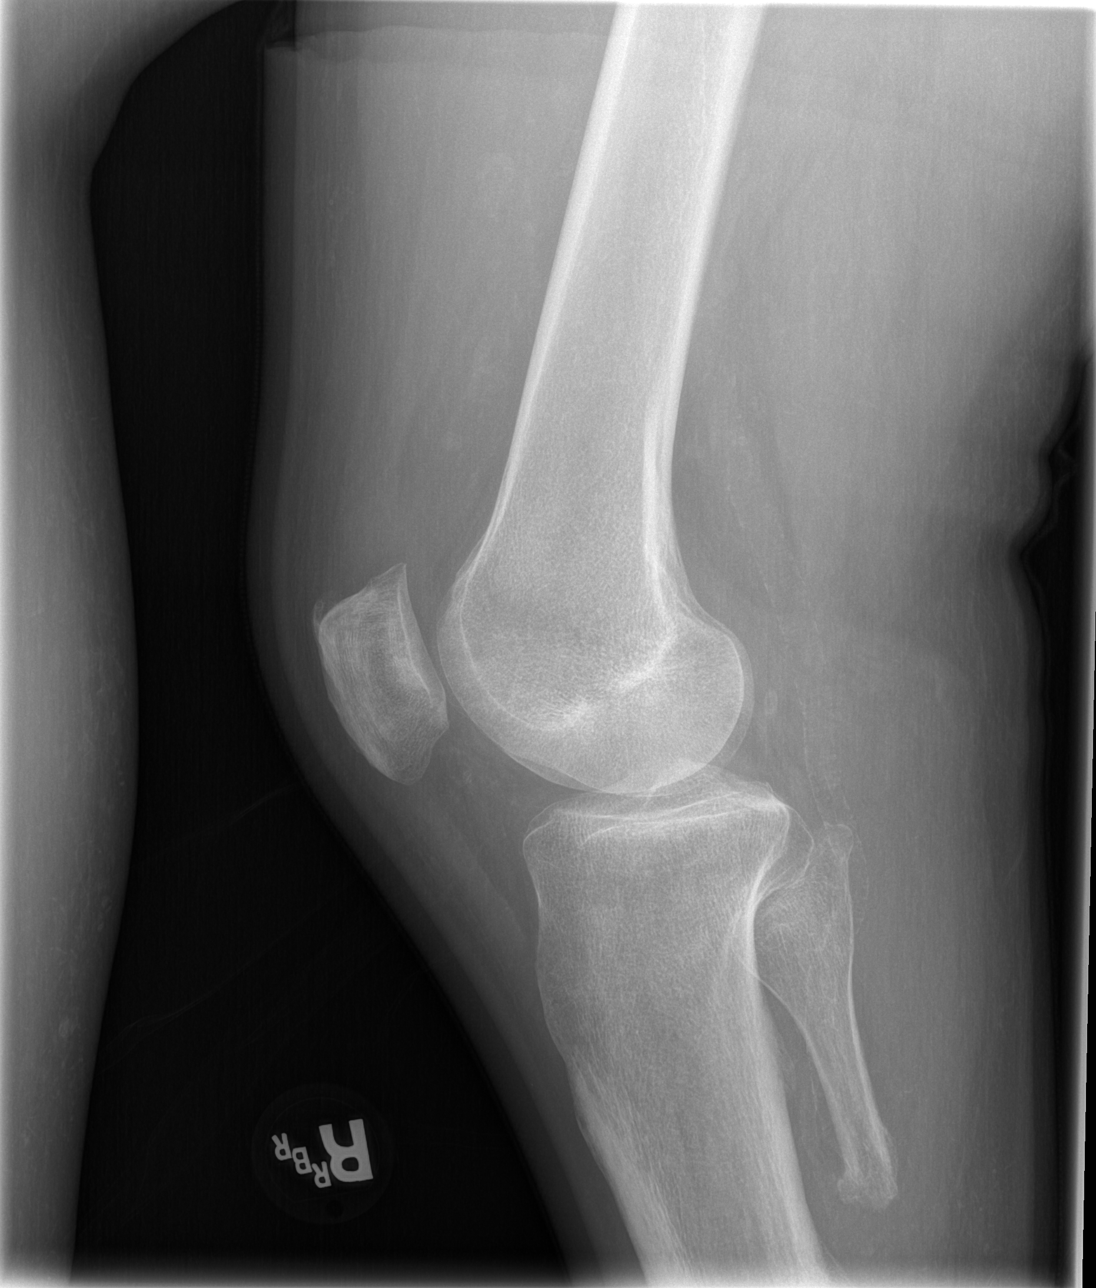

[4 of 4 positions shown; findings below may reference images not displayed]

FINDINGS: Changes of prior below-knee amputation. Joint spaces are maintained.
No acute bony abnormality. Specifically, no fracture, subluxation,
or dislocation. Soft tissues are intact. No joint effusion.
IMPRESSION: No acute bony abnormality.

## 2016-04-26 NOTE — Telephone Encounter (Signed)
Form filled in as must as possible and forwarded to PCP for review and completion.

## 2016-04-26 NOTE — Telephone Encounter (Signed)
Forms received.  Paperwork currently in process.

## 2016-04-30 ENCOUNTER — Encounter: Payer: Self-pay | Admitting: Physical Medicine & Rehabilitation

## 2016-04-30 ENCOUNTER — Ambulatory Visit (HOSPITAL_BASED_OUTPATIENT_CLINIC_OR_DEPARTMENT_OTHER): Payer: Medicare Other | Admitting: Physical Medicine & Rehabilitation

## 2016-04-30 ENCOUNTER — Encounter: Payer: Medicare Other | Attending: Physical Medicine & Rehabilitation

## 2016-04-30 ENCOUNTER — Other Ambulatory Visit: Payer: Self-pay | Admitting: Physician Assistant

## 2016-04-30 VITALS — BP 129/80 | HR 87 | Resp 16

## 2016-04-30 DIAGNOSIS — Z89511 Acquired absence of right leg below knee: Secondary | ICD-10-CM | POA: Diagnosis not present

## 2016-04-30 DIAGNOSIS — M47816 Spondylosis without myelopathy or radiculopathy, lumbar region: Secondary | ICD-10-CM | POA: Diagnosis not present

## 2016-04-30 DIAGNOSIS — I10 Essential (primary) hypertension: Secondary | ICD-10-CM | POA: Insufficient documentation

## 2016-04-30 DIAGNOSIS — E119 Type 2 diabetes mellitus without complications: Secondary | ICD-10-CM | POA: Insufficient documentation

## 2016-04-30 NOTE — Progress Notes (Signed)
Subjective:    Patient ID: Justin Stone, male    DOB: [birth year], 55 y.o.   MRN: [id number]  HPI   Hx of R foot fracture 2 years ago with Xray demonstrating  Osteomyelitis.  Was then diagnosed with diabetes. Had initial prosthetic fitting  done by [employer],  Initially wt 265, dropped to 180, currently 200lb Has 8ply in addition to silicone liner with loose fit Pain Inventory Average Pain 7 Pain Right Now 7 My pain is sharp, burning and tingling  In the last 24 hours, has pain interfered with the following? General activity 8 Relation with others 7 Enjoyment of life 8 What TIME of day is your pain at its worst? Morning, evening, night Sleep (in general) Poor  Pain is worse with: walking and standing Pain improves with: medication Relief from Meds: 8  Mobility walk with assistance use a cane ability to climb steps?  yes  Function I need assistance with the following:  bathing and meal prep  Neuro/Psych weakness numbness trouble walking anxiety  Prior Studies Any changes since last visit?  no  Physicians involved in your care Any changes since last visit?  no   Family History  Problem Relation Age of Onset  . Diabetes Maternal Uncle   . Diabetes Maternal Grandmother   . Colon cancer Neg Hx   . Colon polyps Neg Hx   . Rectal cancer Neg Hx   . Stomach cancer Neg Hx    Social History   Social History  . Marital status: Single    Spouse name: N/A  . Number of children: N/A  . Years of education: N/A   Social History Main Topics  . Smoking status: Former Games developer  . Smokeless tobacco: Former Neurosurgeon  . Alcohol use Yes     Comment: 1 quart every 3 days - 24 oz cans  . Drug use: No  . Sexual activity: Not Asked   Other Topics Concern  . None   Social History Narrative  . None   Past Surgical History:  Procedure Laterality Date  . HERNIA REPAIR    . right leg removal     for diabetes, wears prosthesis   Past Medical History:  Diagnosis Date  .  Allergy    eye allergies  . Anxiety   . Arthritis   . Diabetes mellitus without complication (HCC)   . GERD (gastroesophageal reflux disease)    past hx > 2 yrs ago  . Hyperlipidemia   . Hypertension   . Neuromuscular disorder (HCC)   . Osteopenia   . Prosthesis adjustment    right leg   BP 129/80   Pulse 87   Resp 16   SpO2 97%   Opioid Risk Score:   Fall Risk Score:  `1  Depression screen PHQ 2/9  Depression screen [facility] 2/9 11/14/2015 10/14/2015 09/16/2015 08/26/2015 05/26/2015 05/06/2015  Decreased Interest 0 2 0 0 0 1  Down, Depressed, Hopeless 0 0 0 0 0 0  PHQ - 2 Score 0 2 0 0 0 1  Altered sleeping - 3 - - 1 1  Tired, decreased energy - 1 - - 0 0  Change in appetite - 0 - - 0 0  Feeling bad or failure about yourself  - 0 - - 0 0  Trouble concentrating - 0 - - 0 0  Moving slowly or fidgety/restless - 0 - - 0 0  Suicidal thoughts - 0 - - 0 0  PHQ-9 Score - 6 - -  1 2      Review of Systems  Musculoskeletal: Positive for joint swelling.       Limb swelling  All other systems reviewed and are negative.      Objective:   Physical Exam  Constitutional: He is oriented to person, place, and time. He appears well-developed and well-nourished.  HENT:  Head: Normocephalic.  Neurological: He is alert and oriented to person, place, and time.  Skin: Skin is warm.  Short BK Residual limb. Nontender to palpation. No erythema or swelling. The right knee has no tenderness over the patellar tendon. No tenderness over the fibular head. No skin breakdown.  Patient ambulates with right BK prosthesis and a cane, some evidence of pistoning  Hip abd 4/5 bilateral  R 4/5 HF, 5/5 left hip flexor     Assessment & Plan:  1.   Right below knee amputation, history of diabetic neuropathy as well as reported osteomyelitis. His amputation was approximately 2 years ago. He has lost around 80 pounds and his current socket is too loose even with additional stockings. Given his weight loss.  He may also require additional component modification. Prosthetist will look into the details of this. Follow-up when this is all resolved. 2. Lumbar spondylosis, right L3, L4, L5, Brady frequency neurotomy. 8/ 22/17 and left L3, L4, L5 radiofrequency neurotomy 04/13/2016. Cannot repeat sooner than February, monitor for recurrence of lumbar pain

## 2016-04-30 NOTE — Patient Instructions (Signed)
Would like to see you after prosthesis is made Please call at that time if it is before January

## 2016-04-30 NOTE — Telephone Encounter (Signed)
We received paperwork but it requires detailed exam. He is scheduled for a visit next week so we can complete then.

## 2016-05-05 ENCOUNTER — Telehealth: Payer: Self-pay | Admitting: Physician Assistant

## 2016-05-05 ENCOUNTER — Ambulatory Visit (INDEPENDENT_AMBULATORY_CARE_PROVIDER_SITE_OTHER): Payer: Medicare Other | Admitting: Physician Assistant

## 2016-05-05 ENCOUNTER — Encounter: Payer: Self-pay | Admitting: Physician Assistant

## 2016-05-05 VITALS — BP 138/82 | HR 105 | Temp 98.5°F | Resp 16 | Ht 67.0 in | Wt 217.4 lb

## 2016-05-05 DIAGNOSIS — G4700 Insomnia, unspecified: Secondary | ICD-10-CM

## 2016-05-05 DIAGNOSIS — E119 Type 2 diabetes mellitus without complications: Secondary | ICD-10-CM | POA: Diagnosis not present

## 2016-05-05 DIAGNOSIS — M545 Low back pain, unspecified: Secondary | ICD-10-CM

## 2016-05-05 DIAGNOSIS — G8929 Other chronic pain: Secondary | ICD-10-CM

## 2016-05-05 DIAGNOSIS — Z89511 Acquired absence of right leg below knee: Secondary | ICD-10-CM

## 2016-05-05 MED ORDER — MORPHINE SULFATE ER 15 MG PO TBCR
15.0000 mg | EXTENDED_RELEASE_TABLET | Freq: Two times a day (BID) | ORAL | 0 refills | Status: DC
Start: 2016-05-05 — End: 2016-05-19

## 2016-05-05 MED ORDER — OXYCODONE HCL 10 MG PO TABS
10.0000 mg | ORAL_TABLET | Freq: Two times a day (BID) | ORAL | 0 refills | Status: DC | PRN
Start: 2016-05-05 — End: 2016-05-19

## 2016-05-05 MED ORDER — TRAZODONE HCL 50 MG PO TABS: 25.0000 mg | ORAL_TABLET | Freq: Every evening | ORAL | 3 refills | Status: DC | PRN

## 2016-05-05 NOTE — Telephone Encounter (Signed)
Fine with me

## 2016-05-05 NOTE — Telephone Encounter (Signed)
ok 

## 2016-05-05 NOTE — Patient Instructions (Signed)
I have refilled your prescriptions. Stop the Xanax. Try the Trazodone as directed for sleep. Let me know how you do on this.  Schedule an appointment with your new provider. It has been a pleasure taking care of you!

## 2016-05-05 NOTE — Telephone Encounter (Signed)
Pt would like to transfer to Dr. Patsy Lager[place]land from [place]. Pt says that he's not familiar with the [place] area.    Is this transfer okay.    ALSO, Dr. Patsy Lageropland if so, should I call pt to schedule a ov to establish care?      Please advise.   Thanks.

## 2016-05-05 NOTE — Progress Notes (Signed)
Pre visit review using our clinic review tool, if applicable. No additional management support is needed unless otherwise documented below in the visit note/SLS  

## 2016-05-06 ENCOUNTER — Other Ambulatory Visit: Payer: Self-pay | Admitting: Physician Assistant

## 2016-05-07 NOTE — Telephone Encounter (Signed)
No I think a 15 minute is fine

## 2016-05-07 NOTE — Telephone Encounter (Signed)
Dr. Patsy Lageropland would you like to have pt scheduled a 30 minute office visit to get him established with you ?

## 2016-05-11 ENCOUNTER — Encounter: Payer: Self-pay | Admitting: Physician Assistant

## 2016-05-11 ENCOUNTER — Ambulatory Visit (INDEPENDENT_AMBULATORY_CARE_PROVIDER_SITE_OTHER): Payer: Medicare Other | Admitting: Physician Assistant

## 2016-05-11 ENCOUNTER — Telehealth: Payer: Self-pay | Admitting: Physician Assistant

## 2016-05-11 ENCOUNTER — Encounter: Payer: Self-pay | Admitting: Gastroenterology

## 2016-05-11 VITALS — BP 130/76 | HR 95 | Temp 98.3°F | Resp 18 | Ht 67.0 in | Wt 239.0 lb

## 2016-05-11 DIAGNOSIS — Z0000 Encounter for general adult medical examination without abnormal findings: Secondary | ICD-10-CM | POA: Diagnosis not present

## 2016-05-11 DIAGNOSIS — Z23 Encounter for immunization: Secondary | ICD-10-CM | POA: Diagnosis not present

## 2016-05-11 DIAGNOSIS — K429 Umbilical hernia without obstruction or gangrene: Secondary | ICD-10-CM

## 2016-05-11 NOTE — Telephone Encounter (Signed)
Caller name: Relationship to patient: Self Can be reached: [phone]  Pharmacy:  Reason for call: FYI: Patient wanted to let provider know that he has made the phone calls necessary and his medical records from previous injuries should be on the way to the office.

## 2016-05-11 NOTE — Patient Instructions (Signed)
Please schedule an appointment for fasting labs. I will call with results as soon as I have them.  I want you to call [facility] at [phone] to schedule your yearly diabetic eye examination.  Also call [facility] at  [phone] to reschedule your Colonoscopy.  You will receive a call from General Surgery to assess the hernia.  Please continue chronic medications as directed. Start a Baby Aspirin (81 mg) daily.   Preventive Care for Adults, Male A healthy lifestyle and preventive care can promote health and wellness. Preventive health guidelines for men include the following key practices:  A routine yearly physical is a good way to check with your health care provider about your health and preventative screening. It is a chance to share any concerns and updates on your health and to receive a thorough exam.  Visit your dentist for a routine exam and preventative care every 6 months. Brush your teeth twice a day and floss once a day. Good oral hygiene prevents tooth decay and gum disease.  The frequency of eye exams is based on your age, health, family medical history, use of contact lenses, and other factors. Follow your health care provider's recommendations for frequency of eye exams.  Eat a healthy diet. Foods such as vegetables, fruits, whole grains, low-fat dairy products, and lean protein foods contain the nutrients you need without too many calories. Decrease your intake of foods high in solid fats, added sugars, and salt. Eat the right amount of calories for you.Get information about a proper diet from your health care provider, if necessary.  Regular physical exercise is one of the most important things you can do for your health. Most adults should get at least 150 minutes of moderate-intensity exercise (any activity that increases your heart rate and causes you to sweat) each week. In addition, most adults need muscle-strengthening exercises on 2 or  more days a week.  Maintain a healthy weight. The body mass index (BMI) is a screening tool to identify possible weight problems. It provides an estimate of body fat based on height and weight. Your health care provider can find your BMI and can help you achieve or maintain a healthy weight.For adults 20 years and older:  A BMI below 18.5 is considered underweight.  A BMI of 18.5 to 24.9 is normal.  A BMI of 25 to 29.9 is considered overweight.  A BMI of 30 and above is considered obese.  Maintain normal blood lipids and cholesterol levels by exercising and minimizing your intake of saturated fat. Eat a balanced diet with plenty of fruit and vegetables. Blood tests for lipids and cholesterol should begin at age 86 and be repeated every 5 years. If your lipid or cholesterol levels are high, you are over 50, or you are at high risk for heart disease, you may need your cholesterol levels checked more frequently.Ongoing high lipid and cholesterol levels should be treated with medicines if diet and exercise are not working.  If you smoke, find out from your health care provider how to quit. If you do not use tobacco, do not start.  Lung cancer screening is recommended for adults aged 73-80 years who are at high risk for developing lung cancer because of a history of smoking. A yearly low-dose CT scan of the lungs is recommended for people who have at least a 30-pack-year history of smoking and are a current smoker or have quit within the past 15 years. A pack year of smoking  is smoking an average of 1 pack of cigarettes a day for 1 year (for example: 1 pack a day for 30 years or 2 packs a day for 15 years). Yearly screening should continue until the smoker has stopped smoking for at least 15 years. Yearly screening should be stopped for people who develop a health problem that would prevent them from having lung cancer treatment.  If you choose to drink alcohol, do not have more than 2 drinks per day.  One drink is considered to be 12 ounces (355 mL) of beer, 5 ounces (148 mL) of wine, or 1.5 ounces (44 mL) of liquor.  Avoid use of street drugs. Do not share needles with anyone. Ask for help if you need support or instructions about stopping the use of drugs.  High blood pressure causes heart disease and increases the risk of stroke. Your blood pressure should be checked at least every 1-2 years. Ongoing high blood pressure should be treated with medicines, if weight loss and exercise are not effective.  If you are 19-74 years old, ask your health care provider if you should take aspirin to prevent heart disease.  Diabetes screening is done by taking a blood sample to check your blood glucose level after you have not eaten for a certain period of time (fasting). If you are not overweight and you do not have risk factors for diabetes, you should be screened once every 3 years starting at age 33. If you are overweight or obese and you are 64-34 years of age, you should be screened for diabetes every year as part of your cardiovascular risk assessment.  Colorectal cancer can be detected and often prevented. Most routine colorectal cancer screening begins at the age of 62 and continues through age 59. However, your health care provider may recommend screening at an earlier age if you have risk factors for colon cancer. On a yearly basis, your health care provider may provide home test kits to check for hidden blood in the stool. Use of a small camera at the end of a tube to directly examine the colon (sigmoidoscopy or colonoscopy) can detect the earliest forms of colorectal cancer. Talk to your health care provider about this at age 33, when routine screening begins. Direct exam of the colon should be repeated every 5-10 years through age 60, unless early forms of precancerous polyps or small growths are found.  People who are at an increased risk for hepatitis B should be screened for this virus. You are  considered at high risk for hepatitis B if:  You were born in a country where hepatitis B occurs often. Talk with your health care provider about which countries are considered high risk.  Your parents were born in a high-risk country and you have not received a shot to protect against hepatitis B (hepatitis B vaccine).  You have HIV or AIDS.  You use needles to inject street drugs.  You live with, or have sex with, someone who has hepatitis B.  You are a man who has sex with other men (MSM).  You get hemodialysis treatment.  You take certain medicines for conditions such as cancer, organ transplantation, and autoimmune conditions.  Hepatitis C blood testing is recommended for all people born from [birth year] through 1965 and any individual with known risks for hepatitis C.  Practice safe sex. Use condoms and avoid high-risk sexual practices to reduce the spread of sexually transmitted infections (STIs). STIs include gonorrhea, chlamydia, syphilis, trichomonas, herpes,  HPV, and human immunodeficiency virus (HIV). Herpes, HIV, and HPV are viral illnesses that have no cure. They can result in disability, cancer, and death.  If you are a man who has sex with other men, you should be screened at least once per year for:  HIV.  Urethral, rectal, and pharyngeal infection of gonorrhea, chlamydia, or both.  If you are at risk of being infected with HIV, it is recommended that you take a prescription medicine daily to prevent HIV infection. This is called preexposure prophylaxis (PrEP). You are considered at risk if:  You are a man who has sex with other men (MSM) and have other risk factors.  You are a heterosexual man, are sexually active, and are at increased risk for HIV infection.  You take drugs by injection.  You are sexually active with a partner who has HIV.  Talk with your health care provider about whether you are at high risk of being infected with HIV. If you choose to begin PrEP,  you should first be tested for HIV. You should then be tested every 3 months for as long as you are taking PrEP.  A one-time screening for abdominal aortic aneurysm (AAA) and surgical repair of large AAAs by ultrasound are recommended for men ages 44 to 45 years who are current or former smokers.  Healthy men should no longer receive prostate-specific antigen (PSA) blood tests as part of routine cancer screening. Talk with your health care provider about prostate cancer screening.  Testicular cancer screening is not recommended for adult males who have no symptoms. Screening includes self-exam, a health care provider exam, and other screening tests. Consult with your health care provider about any symptoms you have or any concerns you have about testicular cancer.  Use sunscreen. Apply sunscreen liberally and repeatedly throughout the day. You should seek shade when your shadow is shorter than you. Protect yourself by wearing long sleeves, pants, a wide-brimmed hat, and sunglasses year round, whenever you are outdoors.  Once a month, do a whole-body skin exam, using a mirror to look at the skin on your back. Tell your health care provider about new moles, moles that have irregular borders, moles that are larger than a pencil eraser, or moles that have changed in shape or color.  Stay current with required vaccines (immunizations).  Influenza vaccine. All adults should be immunized every year.  Tetanus, diphtheria, and acellular pertussis (Td, Tdap) vaccine. An adult who has not previously received Tdap or who does not know his vaccine status should receive 1 dose of Tdap. This initial dose should be followed by tetanus and diphtheria toxoids (Td) booster doses every 10 years. Adults with an unknown or incomplete history of completing a 3-dose immunization series with Td-containing vaccines should begin or complete a primary immunization series including a Tdap dose. Adults should receive a Td booster  every 10 years.  Varicella vaccine. An adult without evidence of immunity to varicella should receive 2 doses or a second dose if he has previously received 1 dose.  Human papillomavirus (HPV) vaccine. Males aged 11-21 years who have not received the vaccine previously should receive the 3-dose series. Males aged 22-26 years may be immunized. Immunization is recommended through the age of 11 years for any male who has sex with males and did not get any or all doses earlier. Immunization is recommended for any person with an immunocompromised condition through the age of 34 years if he did not get any or all doses  earlier. During the 3-dose series, the second dose should be obtained 4-8 weeks after the first dose. The third dose should be obtained 24 weeks after the first dose and 16 weeks after the second dose.  Zoster vaccine. One dose is recommended for adults aged 44 years or older unless certain conditions are present.  Measles, mumps, and rubella (MMR) vaccine. Adults born before 65 generally are considered immune to measles and mumps. Adults born in [birth year] or later should have 1 or more doses of MMR vaccine unless there is a contraindication to the vaccine or there is laboratory evidence of immunity to each of the three diseases. A routine second dose of MMR vaccine should be obtained at least 28 days after the first dose for students attending postsecondary schools, health care workers, or international travelers. People who received inactivated measles vaccine or an unknown type of measles vaccine during 1963-1967 should receive 2 doses of MMR vaccine. People who received inactivated mumps vaccine or an unknown type of mumps vaccine before 1979 and are at high risk for mumps infection should consider immunization with 2 doses of MMR vaccine. Unvaccinated health care workers born before [birth year] who lack laboratory evidence of measles, mumps, or rubella immunity or laboratory confirmation of disease  should consider measles and mumps immunization with 2 doses of MMR vaccine or rubella immunization with 1 dose of MMR vaccine.  Pneumococcal 13-valent conjugate (PCV13) vaccine. When indicated, a person who is uncertain of his immunization history and has no record of immunization should receive the PCV13 vaccine. All adults 46 years of age and older should receive this vaccine. An adult aged 18 years or older who has certain medical conditions and has not been previously immunized should receive 1 dose of PCV13 vaccine. This PCV13 should be followed with a dose of pneumococcal polysaccharide (PPSV23) vaccine. Adults who are at high risk for pneumococcal disease should obtain the PPSV23 vaccine at least 8 weeks after the dose of PCV13 vaccine. Adults older than 54 years of age who have normal immune system function should obtain the PPSV23 vaccine dose at least 1 year after the dose of PCV13 vaccine.  Pneumococcal polysaccharide (PPSV23) vaccine. When PCV13 is also indicated, PCV13 should be obtained first. All adults aged 16 years and older should be immunized. An adult younger than age 52 years who has certain medical conditions should be immunized. Any person who resides in a nursing home or long-term care facility should be immunized. An adult smoker should be immunized. People with an immunocompromised condition and certain other conditions should receive both PCV13 and PPSV23 vaccines. People with human immunodeficiency virus (HIV) infection should be immunized as soon as possible after diagnosis. Immunization during chemotherapy or radiation therapy should be avoided. Routine use of PPSV23 vaccine is not recommended for American Indians, [place] Natives, or people younger than 65 years unless there are medical conditions that require PPSV23 vaccine. When indicated, people who have unknown immunization and have no record of immunization should receive PPSV23 vaccine. One-time revaccination 5 years after the  first dose of PPSV23 is recommended for people aged 19-64 years who have chronic kidney failure, nephrotic syndrome, asplenia, or immunocompromised conditions. People who received 1-2 doses of PPSV23 before age 21 years should receive another dose of PPSV23 vaccine at age 87 years or later if at least 5 years have passed since the previous dose. Doses of PPSV23 are not needed for people immunized with PPSV23 at or after age 70 years.  Meningococcal vaccine. Adults  with asplenia or persistent complement component deficiencies should receive 2 doses of quadrivalent meningococcal conjugate (MenACWY-D) vaccine. The doses should be obtained at least 2 months apart. Microbiologists working with certain meningococcal bacteria, [employer] recruits, people at risk during an outbreak, and people who travel to or live in countries with a high rate of meningitis should be immunized. A first-year college student up through age 62 years who is living in a residence hall should receive a dose if he did not receive a dose on or after his 16th birthday. Adults who have certain high-risk conditions should receive one or more doses of vaccine.  Hepatitis A vaccine. Adults who wish to be protected from this disease, have chronic liver disease, work with hepatitis A-infected animals, work in hepatitis A research labs, or travel to or work in countries with a high rate of hepatitis A should be immunized. Adults who were previously unvaccinated and who anticipate close contact with an international adoptee during the first 60 days after arrival in the [place] from a country with a high rate of hepatitis A should be immunized.  Hepatitis B vaccine. Adults should be immunized if they wish to be protected from this disease, are under age 71 years and have diabetes, have chronic liver disease, have had more than one sex partner in the past 6 months, may be exposed to blood or other infectious body fluids, are household contacts or  sex partners of hepatitis B positive people, are clients or workers in certain care facilities, or travel to or work in countries with a high rate of hepatitis B.  Haemophilus influenzae type b (Hib) vaccine. A previously unvaccinated person with asplenia or sickle cell disease or having a scheduled splenectomy should receive 1 dose of Hib vaccine. Regardless of previous immunization, a recipient of a hematopoietic stem cell transplant should receive a 3-dose series 6-12 months after his successful transplant. Hib vaccine is not recommended for adults with HIV infection. Preventive Service / Frequency Ages 34 to 29  Blood pressure check.** / Every 3-5 years.  Lipid and cholesterol check.** / Every 5 years beginning at age 77.  Hepatitis C blood test.** / For any individual with known risks for hepatitis C.  Skin self-exam. / Monthly.  Influenza vaccine. / Every year.  Tetanus, diphtheria, and acellular pertussis (Tdap, Td) vaccine.** / Consult your health care provider. 1 dose of Td every 10 years.  Varicella vaccine.** / Consult your health care provider.  HPV vaccine. / 3 doses over 6 months, if 72 or younger.  Measles, mumps, rubella (MMR) vaccine.** / You need at least 1 dose of MMR if you were born in 1957 or later. You may also need a second dose.  Pneumococcal 13-valent conjugate (PCV13) vaccine.** / Consult your health care provider.  Pneumococcal polysaccharide (PPSV23) vaccine.** / 1 to 2 doses if you smoke cigarettes or if you have certain conditions.  Meningococcal vaccine.** / 1 dose if you are age 56 to 57 years and a Market researcher living in a residence hall, or have one of several medical conditions. You may also need additional booster doses.  Hepatitis A vaccine.** / Consult your health care provider.  Hepatitis B vaccine.** / Consult your health care provider.  Haemophilus influenzae type b (Hib) vaccine.** / Consult your health care provider. Ages 65  to 58  Blood pressure check.** / Every year.  Lipid and cholesterol check.** / Every 5 years beginning at age 22.  Lung cancer screening. / Every year  if you are aged 8-80 years and have a 30-pack-year history of smoking and currently smoke or have quit within the past 15 years. Yearly screening is stopped once you have quit smoking for at least 15 years or develop a health problem that would prevent you from having lung cancer treatment.  Fecal occult blood test (FOBT) of stool. / Every year beginning at age 31 and continuing until age 13. You may not have to do this test if you get a colonoscopy every 10 years.  Flexible sigmoidoscopy** or colonoscopy.** / Every 5 years for a flexible sigmoidoscopy or every 10 years for a colonoscopy beginning at age 65 and continuing until age 39.  Hepatitis C blood test.** / For all people born from [birth year] through 1965 and any individual with known risks for hepatitis C.  Skin self-exam. / Monthly.  Influenza vaccine. / Every year.  Tetanus, diphtheria, and acellular pertussis (Tdap/Td) vaccine.** / Consult your health care provider. 1 dose of Td every 10 years.  Varicella vaccine.** / Consult your health care provider.  Zoster vaccine.** / 1 dose for adults aged 52 years or older.  Measles, mumps, rubella (MMR) vaccine.** / You need at least 1 dose of MMR if you were born in 1957 or later. You may also need a second dose.  Pneumococcal 13-valent conjugate (PCV13) vaccine.** / Consult your health care provider.  Pneumococcal polysaccharide (PPSV23) vaccine.** / 1 to 2 doses if you smoke cigarettes or if you have certain conditions.  Meningococcal vaccine.** / Consult your health care provider.  Hepatitis A vaccine.** / Consult your health care provider.  Hepatitis B vaccine.** / Consult your health care provider.  Haemophilus influenzae type b (Hib) vaccine.** / Consult your health care provider. Ages 19 and over  Blood pressure check.** /  Every year.  Lipid and cholesterol check.**/ Every 5 years beginning at age 50.  Lung cancer screening. / Every year if you are aged 37-80 years and have a 30-pack-year history of smoking and currently smoke or have quit within the past 15 years. Yearly screening is stopped once you have quit smoking for at least 15 years or develop a health problem that would prevent you from having lung cancer treatment.  Fecal occult blood test (FOBT) of stool. / Every year beginning at age 60 and continuing until age 73. You may not have to do this test if you get a colonoscopy every 10 years.  Flexible sigmoidoscopy** or colonoscopy.** / Every 5 years for a flexible sigmoidoscopy or every 10 years for a colonoscopy beginning at age 71 and continuing until age 32.  Hepatitis C blood test.** / For all people born from [birth year] through 1965 and any individual with known risks for hepatitis C.  Abdominal aortic aneurysm (AAA) screening.** / A one-time screening for ages 6 to 42 years who are current or former smokers.  Skin self-exam. / Monthly.  Influenza vaccine. / Every year.  Tetanus, diphtheria, and acellular pertussis (Tdap/Td) vaccine.** / 1 dose of Td every 10 years.  Varicella vaccine.** / Consult your health care provider.  Zoster vaccine.** / 1 dose for adults aged 53 years or older.  Pneumococcal 13-valent conjugate (PCV13) vaccine.** / 1 dose for all adults aged 64 years and older.  Pneumococcal polysaccharide (PPSV23) vaccine.** / 1 dose for all adults aged 58 years and older.  Meningococcal vaccine.** / Consult your health care provider.  Hepatitis A vaccine.** / Consult your health care provider.  Hepatitis B vaccine.** / Consult your health  care provider.  Haemophilus influenzae type b (Hib) vaccine.** / Consult your health care provider. **Family history and personal history of risk and conditions may change your health care provider's recommendations.   This information is not  intended to replace advice given to you by your health care provider. Make sure you discuss any questions you have with your health care provider.   Document Released: 09/07/2001 Document Revised: 08/02/2014 Document Reviewed: 12/07/2010 Elsevier Interactive Patient Education [employer].

## 2016-05-11 NOTE — Progress Notes (Addendum)
Patient presents to clinic today for annual exam.  Patient is fasting for labs. Patient in last week for DM follow-up. Foot exam updated at that time. Patient due for tetanus but declines today. Requests flu vaccine. Patient previously set up for colonoscopy but had to cancel. Needs to reschedule. Denies blood in stool or family history of colorectal cancer. Patient endorses chronic hernia or abdomen. Asymptomatic per patient but would like reassessed. .   Lab Results  Component Value Date   HGBA1C 4.4 (L) 02/06/2016   Endorses last eye examination 1 year prior. Is followed by [facility] -- denies history of retinopathy.  Health Maintenance: Immunizations -- up-to-date.  Colonoscopy --  HIV Screen -- Endorses screen 5 years prior. Negative. Recommends repeat screen.  Diabetic Eye Examination -- Overdue. Followed by [facility]. Is scheduling a follow-up appointment.   Past Medical History:  Diagnosis Date  . Allergy    eye allergies  . Anxiety   . Arthritis   . Diabetes mellitus without complication (HCC)   . GERD (gastroesophageal reflux disease)    past hx > 2 yrs ago  . Hyperlipidemia   . Hypertension   . Neuromuscular disorder (HCC)   . Osteopenia   . Prosthesis adjustment    right leg    Past Surgical History:  Procedure Laterality Date  . HERNIA REPAIR    . right leg removal     for diabetes, wears prosthesis    Current Outpatient Prescriptions on File Prior to Visit  Medication Sig Dispense Refill  . amLODipine (NORVASC) 10 MG tablet TAKE 1 TABLET(10 MG) BY MOUTH DAILY 30 tablet 4  . azelastine (OPTIVAR) 0.05 % ophthalmic solution Place 1 drop into both eyes 2 (two) times daily. 6 mL 12  . B-D ULTRAFINE III SHORT PEN 31G X 8 MM MISC Reported on 12/19/2015  1  . carisoprodol (SOMA) 350 MG tablet Take 1 tablet (350 mg total) by mouth 3 (three) times daily. 90 tablet 0  . furosemide (LASIX) 40 MG tablet Take 1 tablet (40 mg total) by mouth  daily. 90 tablet 0  . gabapentin (NEURONTIN) 300 MG capsule TAKE 2 CAPSULES BY MOUTH THREE TIMES DAILY (Patient taking differently: TAKE 3CAPSULES BY MOUTH two  TIMES DAILY) 540 capsule 3  . lisinopril (PRINIVIL,ZESTRIL) 10 MG tablet TAKE 1 TABLET BY MOUTH DAILY 90 tablet 1  . morphine (MS CONTIN) 15 MG 12 hr tablet Take 1 tablet (15 mg total) by mouth every 12 (twelve) hours. 60 tablet 0  . ONETOUCH DELICA LANCETS 33G MISC Use to check blood sugar twice daily. 100 each 5  . ONETOUCH VERIO test strip USE TO TEST BLOOD GLUCOSE TWICE DAILY 100 each 0  . Oxycodone HCl 10 MG TABS Take 1 tablet (10 mg total) by mouth 2 (two) times daily as needed. 70 tablet 0  . sildenafil (REVATIO) 20 MG tablet Take 1 tablet by mouth as directed 30 minutes before intercourse 50 tablet 0  . simvastatin (ZOCOR) 10 MG tablet TAKE 1 TABLET BY MOUTH AT BEDTIME 90 tablet 1  . traZODone (DESYREL) 50 MG tablet Take 0.5 tablets (25 mg total) by mouth at bedtime as needed for sleep. 30 tablet 3  . venlafaxine (EFFEXOR) 75 MG tablet TAKE 1 TABLET(75 MG) BY MOUTH TWICE DAILY 180 tablet 0   No current facility-administered medications on file prior to visit.     No Known Allergies  Family History  Problem Relation Age of Onset  .  Diabetes Maternal Uncle   . Diabetes Maternal Grandmother   . Colon cancer Neg Hx   . Colon polyps Neg Hx   . Rectal cancer Neg Hx   . Stomach cancer Neg Hx     Social History   Social History  . Marital status: Single    Spouse name: N/A  . Number of children: N/A  . Years of education: N/A   Occupational History  . Not on file.   Social History Main Topics  . Smoking status: Former Games developermoker  . Smokeless tobacco: Former NeurosurgeonUser  . Alcohol use Yes     Comment: 1 quart every 3 days - 24 oz cans  . Drug use: No  . Sexual activity: Not on file   Other Topics Concern  . Not on file   Social History Narrative  . No narrative on file   Review of Systems  Constitutional: Negative for  fever and weight loss.  HENT: Negative for ear discharge, ear pain, hearing loss and tinnitus.   Eyes: Negative for blurred vision, double vision, photophobia and pain.  Respiratory: Negative for cough and shortness of breath.   Cardiovascular: Negative for chest pain and palpitations.  Gastrointestinal: Negative for abdominal pain, blood in stool, constipation, diarrhea, heartburn, melena, nausea and vomiting.  Genitourinary: Negative for dysuria, flank pain, frequency, hematuria and urgency.  Musculoskeletal: Positive for back pain and joint pain. Negative for falls.  Neurological: Negative for dizziness, loss of consciousness and headaches.  Endo/Heme/Allergies: Negative for environmental allergies.  Psychiatric/Behavioral: Negative for depression, hallucinations, substance abuse and suicidal ideas. The patient is not nervous/anxious and does not have insomnia.     BP 130/76 (BP Location: Left Arm, Patient Position: Sitting, Cuff Size: Large)   Pulse 95   Temp 98.3 F (36.8 C) (Oral)   Resp 18   Ht 5\' 7"  (1.702 m)   Wt 239 lb (108.4 kg)   SpO2 95%   BMI 37.43 kg/m   Physical Exam  Constitutional: He is oriented to person, place, and time and well-developed, well-nourished, and in no distress.  HENT:  Head: Normocephalic and atraumatic.  Right Ear: External ear normal.  Left Ear: External ear normal.  Nose: Nose normal.  Mouth/Throat: Oropharynx is clear and moist. No oropharyngeal exudate.  Eyes: Conjunctivae and EOM are normal. Pupils are equal, round, and reactive to light.  Neck: Neck supple. No thyromegaly present.  Cardiovascular: Normal rate, regular rhythm, normal heart sounds and intact distal pulses.   Pulmonary/Chest: Effort normal and breath sounds normal. No respiratory distress. He has no wheezes. He has no rales. He exhibits no tenderness.  Abdominal: Soft. Bowel sounds are normal. He exhibits no distension and no mass. There is no tenderness. There is no rebound  and no guarding. A hernia is present. Hernia confirmed positive in the umbilical area.  Umbilical hernia palpable about 4 cm in diameter. Is easily reducible and non-tender.  Genitourinary: Testes/scrotum normal.  Genitourinary Comments: Declines DRE.  Lymphadenopathy:    He has no cervical adenopathy.  Neurological: He is alert and oriented to person, place, and time.  Skin: Skin is warm and dry. No rash noted.  Psychiatric: Affect normal.  Vitals reviewed.   No results found for this or any previous visit (from the past 2160 hour(s)).  Assessment/Plan: Annual physical exam Depression screen negative. Health Maintenance reviewed -- Flu shot updated. Patient to call [facility] to reschedule colonoscopy. Number given. The natural history of prostate cancer and ongoing controversy regarding screening  and potential treatment outcomes of prostate cancer has been discussed with the patient. The meaning of a false positive PSA and a false negative PSA has been discussed. He indicates understanding of the limitations of this screening test and wishes  to proceed with screening PSA testing. . Preventive schedule discussed and handout given in AVS. Will obtain fasting labs today.   Umbilical hernia without obstruction and without gangrene Noted on exam. No incarceration or strangulation. No palpable tenderness. Getting larger per patient. Referral to General Surgery placed for further assessment.     Piedad Climes, PA-C

## 2016-05-11 NOTE — Progress Notes (Signed)
Pre visit review using our clinic review tool, if applicable. No additional management support is needed unless otherwise documented below in the visit note/SLS  

## 2016-05-11 NOTE — Telephone Encounter (Signed)
Pt has been scheduled. Pt will also be having labs completed on that day that were ordered by Malva Coganody Martin. (current pcp)

## 2016-05-11 NOTE — Telephone Encounter (Signed)
Not sure what this in reference to. Noted. Will keep an eye out for records

## 2016-05-13 ENCOUNTER — Telehealth: Payer: Self-pay | Admitting: Physician Assistant

## 2016-05-13 DIAGNOSIS — G4700 Insomnia, unspecified: Secondary | ICD-10-CM | POA: Insufficient documentation

## 2016-05-13 DIAGNOSIS — Z0000 Encounter for general adult medical examination without abnormal findings: Secondary | ICD-10-CM | POA: Insufficient documentation

## 2016-05-13 DIAGNOSIS — Z23 Encounter for immunization: Secondary | ICD-10-CM | POA: Insufficient documentation

## 2016-05-13 DIAGNOSIS — K429 Umbilical hernia without obstruction or gangrene: Secondary | ICD-10-CM | POA: Insufficient documentation

## 2016-05-13 NOTE — Progress Notes (Addendum)
Patient presents to clinic today for follow-up of DM II with neuropathy, previously controlled. Patient is currently on diet and exercise alone. Endorses watching diet and staying active as much as possible. Followed by pain management for chronic pain both nerve-related and arthritic. Is overdue for eye examination. Referral will be placed. Is due for foot examination. Needs paperwork completed for diabetic footwear and updated prosthetic. Prosthetic has been ill-fitting and impairs effective ambulation. Noted the lining in his prosthesis sometimes scratches his amputation stump and is uncomfortable. Denies any skin breakdown in the area.  Patient also with anxiety. Is currently on Effexor and Gabapentin. Endorses taking medications as directed. Uses Xanax PRN for sleep. Is becoming subtherapeutic. Would like to discuss options.    Past Medical History:  Diagnosis Date  . Allergy    eye allergies  . Anxiety   . Arthritis   . Diabetes mellitus without complication (HCC)   . GERD (gastroesophageal reflux disease)    past hx > 2 yrs ago  . Hyperlipidemia   . Hypertension   . Neuromuscular disorder (HCC)   . Osteopenia   . Prosthesis adjustment    right leg    Current Outpatient Prescriptions on File Prior to Visit  Medication Sig Dispense Refill  . amLODipine (NORVASC) 10 MG tablet TAKE 1 TABLET(10 MG) BY MOUTH DAILY 30 tablet 4  . azelastine (OPTIVAR) 0.05 % ophthalmic solution Place 1 drop into both eyes 2 (two) times daily. 6 mL 12  . B-D ULTRAFINE III SHORT PEN 31G X 8 MM MISC Reported on 12/19/2015  1  . carisoprodol (SOMA) 350 MG tablet Take 1 tablet (350 mg total) by mouth 3 (three) times daily. 90 tablet 0  . furosemide (LASIX) 40 MG tablet Take 1 tablet (40 mg total) by mouth daily. 90 tablet 0  . gabapentin (NEURONTIN) 300 MG capsule TAKE 2 CAPSULES BY MOUTH THREE TIMES DAILY (Patient taking differently: TAKE 3CAPSULES BY MOUTH two  TIMES DAILY) 540 capsule 3  . lisinopril  (PRINIVIL,ZESTRIL) 10 MG tablet TAKE 1 TABLET BY MOUTH DAILY 90 tablet 1  . ONETOUCH DELICA LANCETS 33G MISC Use to check blood sugar twice daily. 100 each 5  . sildenafil (REVATIO) 20 MG tablet Take 1 tablet by mouth as directed 30 minutes before intercourse 50 tablet 0  . simvastatin (ZOCOR) 10 MG tablet TAKE 1 TABLET BY MOUTH AT BEDTIME 90 tablet 1  . venlafaxine (EFFEXOR) 75 MG tablet TAKE 1 TABLET(75 MG) BY MOUTH TWICE DAILY 180 tablet 0   No current facility-administered medications on file prior to visit.     No Known Allergies  Family History  Problem Relation Age of Onset  . Diabetes Maternal Uncle   . Diabetes Maternal Grandmother   . Colon cancer Neg Hx   . Colon polyps Neg Hx   . Rectal cancer Neg Hx   . Stomach cancer Neg Hx     Social History   Social History  . Marital status: Single    Spouse name: N/A  . Number of children: N/A  . Years of education: N/A   Social History Main Topics  . Smoking status: Former Games developermoker  . Smokeless tobacco: Former NeurosurgeonUser  . Alcohol use Yes     Comment: 1 quart every 3 days - 24 oz cans  . Drug use: No  . Sexual activity: Not Asked   Other Topics Concern  . None   Social History Narrative  . None    Review of Systems -  See HPI.  All other ROS are negative.  BP 138/82 (BP Location: Left Arm, Patient Position: Sitting, Cuff Size: Large)   Pulse (!) 105   Temp 98.5 F (36.9 C) (Oral)   Resp 16   Ht 5\' 7"  (1.702 m)   Wt 217 lb 6 oz (98.6 kg) Comment: Patient has 15 lb prosthetic leg that has been subtratced  SpO2 98%   BMI 34.05 kg/m   Physical Exam  Constitutional: He is oriented to person, place, and time and well-developed, well-nourished, and in no distress.  HENT:  Head: Normocephalic and atraumatic.  Eyes: Conjunctivae are normal.  Cardiovascular: Normal rate, regular rhythm, normal heart sounds and intact distal pulses.   Pulmonary/Chest: Effort normal and breath sounds normal.  Musculoskeletal:  R BKA -  prosthetic in place. Skin is excoriated at BKA stump. No ulceration or delayed healing noted. Seems prosthetic may be ill fitting.  Neurological: He is alert and oriented to person, place, and time.  Skin: Skin is warm and dry. No rash noted.  Psychiatric: Affect normal.  Vitals reviewed.  Diabetic Foot Form - Detailed   Diabetic Foot Exam - detailed Diabetic Foot exam was performed with the following findings:  Yes 05/05/2016  9:33 PM  Visual Foot Exam completed.:  Yes  Is there a history of foot ulcer?:  No Can the patient see the bottom of their feet?:  Yes Are the shoes appropriate in style and fit?:  Yes Is there swelling or and abnormal foot shape?:  No Are the toenails long?:  Yes Are the toenails thick?:  Yes Do you have pain in calf while walking?:  No Is there a claw toe deformity?:  No Is there elevated skin temparature?:  No Is there limited skin dorsiflexion?:  No Is there foot or ankle muscle weakness?:  No Are the toenails ingrown?:  No Normal Range of Motion:  Yes Pulse Foot Exam completed.:  Yes  Right posterior Tibialias:  Other Left posterior Tibialias:  Present  Right Dorsalis Pedis:  Other Left Dorsalis Pedis:  Present  Swelling:  No Semmes-Weinstein Monofilament Test   Comments:  R foot/ankle are surgically absent.  Monofilament test of L foot reveals sensation diminished overall.     Assessment/Plan: Type II diabetes mellitus, well controlled (HCC) Foot exam updated today. Findings are as described. BP stable. Sugars previously well controlled with diet and exercise. Patient has CPE scheduled next week. Will repeat labs at that time. Referral to Ophthalmology placed.  Insomnia Stop Xanax. Will attempt trial of Trazodone 25 mg. FU scheduled.  Status post below knee amputation of right lower extremity (HCC) Prosthetic seems to be ill-fitting. Has seen specialist to get refitted. Will sign forms for prosthetic order so he can get things taken care of and  get back quality ambulation.   Piedad Climes, PA-C

## 2016-05-13 NOTE — Telephone Encounter (Signed)
Also needs paperwork for prosthesis and shoes to be completed for [facility]. States forms have been sent

## 2016-05-13 NOTE — Assessment & Plan Note (Signed)
Noted on exam. No incarceration or strangulation. No palpable tenderness. Getting larger per patient. Referral to General Surgery placed for further assessment.

## 2016-05-13 NOTE — Assessment & Plan Note (Signed)
Depression screen negative. Health Maintenance reviewed -- Flu shot updated. Patient to call [facility] to reschedule colonoscopy. Number given. The natural history of prostate cancer and ongoing controversy regarding screening and potential treatment outcomes of prostate cancer has been discussed with the patient. The meaning of a false positive PSA and a false negative PSA has been discussed. He indicates understanding of the limitations of this screening test and wishes  to proceed with screening PSA testing. . Preventive schedule discussed and handout given in AVS. Will obtain fasting labs today.

## 2016-05-13 NOTE — Assessment & Plan Note (Signed)
Foot exam updated today. Findings are as described. BP stable. Sugars previously well controlled with diet and exercise. Patient has CPE scheduled next week. Will repeat labs at that time. Referral to Ophthalmology placed.

## 2016-05-13 NOTE — Telephone Encounter (Signed)
Caller name: Relationship to patient: Self Can be reached: [phone]  Pharmacy:  Reason for call: Patient request that his weights from 10/2014 until present be faxed to Berdine Danceavid Kelly @ [facility]. States [name] know who this is and have the fax number.

## 2016-05-13 NOTE — Assessment & Plan Note (Signed)
Stop Xanax. Will attempt trial of Trazodone 25 mg. FU scheduled.

## 2016-05-14 NOTE — Telephone Encounter (Signed)
Everything faxed in this morning. Called patient and patient aware that all is taken care of.

## 2016-05-17 NOTE — Telephone Encounter (Signed)
Caller name: Misty StanleyLisa with [facility] Can be reached: [phone] Fax: [phone]  Reason for call: Misty StanleyLisa stated she received paperwork and they are perfect for diabetic shoes but there is no discussion of prothesis. She states if it was discussed and notes are amended she may be able to use. Please amend if possible and fax to her once completed. If not able to amend pt would need another face to face to discuss prothesis.

## 2016-05-18 DIAGNOSIS — K429 Umbilical hernia without obstruction or gangrene: Secondary | ICD-10-CM | POA: Diagnosis not present

## 2016-05-18 NOTE — Telephone Encounter (Signed)
Thankfully we did discuss at visit. My apologies for not including more about this in notes. I have addended the note and we will resubmit to [facility]. Please call Misty StanleyLisa with [facility] to let her know.

## 2016-05-18 NOTE — Telephone Encounter (Signed)
LMOM with contact name and number [for return call, if needed] RE: [facility] 10/24

## 2016-05-19 ENCOUNTER — Ambulatory Visit (INDEPENDENT_AMBULATORY_CARE_PROVIDER_SITE_OTHER): Payer: Medicare Other | Admitting: Family Medicine

## 2016-05-19 ENCOUNTER — Encounter: Payer: Self-pay | Admitting: Family Medicine

## 2016-05-19 ENCOUNTER — Other Ambulatory Visit: Payer: Medicare Other

## 2016-05-19 VITALS — BP 132/84 | HR 89 | Temp 98.2°F | Ht 67.0 in | Wt 211.2 lb

## 2016-05-19 DIAGNOSIS — M545 Low back pain: Secondary | ICD-10-CM | POA: Diagnosis not present

## 2016-05-19 DIAGNOSIS — F1190 Opioid use, unspecified, uncomplicated: Secondary | ICD-10-CM | POA: Diagnosis not present

## 2016-05-19 DIAGNOSIS — G8929 Other chronic pain: Secondary | ICD-10-CM

## 2016-05-19 DIAGNOSIS — R7309 Other abnormal glucose: Secondary | ICD-10-CM

## 2016-05-19 DIAGNOSIS — E119 Type 2 diabetes mellitus without complications: Secondary | ICD-10-CM

## 2016-05-19 DIAGNOSIS — R740 Nonspecific elevation of levels of transaminase and lactic acid dehydrogenase [LDH]: Secondary | ICD-10-CM | POA: Diagnosis not present

## 2016-05-19 DIAGNOSIS — R7401 Elevation of levels of liver transaminase levels: Secondary | ICD-10-CM

## 2016-05-19 LAB — COMPREHENSIVE METABOLIC PANEL
ALK PHOS: 83 U/L (ref 39–117)
ALT: 50 U/L (ref 0–53)
AST: 37 U/L (ref 0–37)
Albumin: 4.3 g/dL (ref 3.5–5.2)
BUN: 28 mg/dL — AB (ref 6–23)
CHLORIDE: 104 meq/L (ref 96–112)
CO2: 22 mEq/L (ref 19–32)
CREATININE: 1.35 mg/dL (ref 0.40–1.50)
Calcium: 10.5 mg/dL (ref 8.4–10.5)
GFR: 70.76 mL/min (ref >60.00)
GLUCOSE: 107 mg/dL — AB (ref 70–99)
POTASSIUM: 4.6 meq/L (ref 3.5–5.1)
SODIUM: 135 meq/L (ref 135–145)
TOTAL PROTEIN: 9.2 g/dL — AB (ref 6.0–8.3)
Total Bilirubin: 0.4 mg/dL (ref 0.2–1.2)

## 2016-05-19 LAB — CBC
HEMATOCRIT: 38.1 % — AB (ref 39.0–52.0)
HEMOGLOBIN: 12.3 g/dL — AB (ref 13.0–17.0)
MCHC: 32.3 g/dL (ref 30.0–36.0)
MCV: 89.5 fl (ref 78.0–100.0)
Platelets: 153 10*3/uL (ref 150.0–400.0)
RBC: 4.26 Mil/uL (ref 4.22–5.81)
RDW: 14.8 % (ref 11.5–15.5)
WBC: 8.3 10*3/uL (ref 4.0–10.5)

## 2016-05-19 MED ORDER — MORPHINE SULFATE ER 15 MG PO TBCR: 15.0000 mg | EXTENDED_RELEASE_TABLET | Freq: Two times a day (BID) | ORAL | 0 refills | Status: DC

## 2016-05-19 MED ORDER — OXYCODONE HCL 10 MG PO TABS: 10.0000 mg | ORAL_TABLET | Freq: Two times a day (BID) | ORAL | 0 refills | Status: DC | PRN

## 2016-05-19 NOTE — Progress Notes (Signed)
[facility] [address] [place], [place] [address] [phone]  Date:  05/19/2016   Name:  Justin Stone   DOB:  1960/12/10   MRN:  [id number]  PCP:  Piedad Climes, PA-C    Chief Complaint: Establish Care (Pt here to est care. )   History of Present Illness:  Justin Stone is a 55 y.o. very pleasant male patient who presents with the following:  Here today to establish care- he plans to see me as Selena Batten is leaving for another location He needs fasting labs today, he did take his pain medications   He broke his foot and developed what sounds like gangrene- he ended up with a right BKA about 2 years ago. However this does not fit him that well and he has pain from the plastic around his thigh. They do plan to give him a new type of prosthesis and he is being seen and fitted tomorrow.    Lab Results  Component Value Date   HGBA1C 4.4 (L) 02/06/2016    He does see Dr. Larna Daughters but he gets his pain meds through [place].   He takes MS contin BID, and he uses oxycodone BID- TID as needed.   He does need a UDS today and his last result was high risk  He is having his colonoscopy in December.   Patient Active Problem List   Diagnosis Date Noted  . Insomnia 05/13/2016  . Umbilical hernia without obstruction and without gangrene 05/13/2016  . Annual physical exam 05/13/2016  . Encounter for immunization 05/13/2016  . Colon cancer screening 02/06/2016  . Low serum sodium 11/03/2015  . Spondylosis of lumbar region without myelopathy or radiculopathy 07/22/2015  . Obesity 06/29/2015  . 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 Type II diabetes mellitus, well controlled (HCC) 03/26/2015  . S/P BKA (below knee amputation) (HCC) 03/26/2015  . Alcoholic cirrhosis of liver without ascites (HCC) 02/22/2015  . Chronic hepatitis C without hepatic coma (HCC) 02/22/2015  . Chronic low back pain  12/29/2014  . Essential hypertension, benign 12/29/2014  . Hyperlipidemia LDL goal <100 12/29/2014    Past Medical History:  Diagnosis Date  . Allergy    eye allergies  . Anxiety   . Arthritis   . Diabetes mellitus without complication (HCC)   . GERD (gastroesophageal reflux disease)    past hx > 2 yrs ago  . Hyperlipidemia   . Hypertension   . Neuromuscular disorder (HCC)   . Osteopenia   . Prosthesis adjustment    right leg    Past Surgical History:  Procedure Laterality Date  . HERNIA REPAIR    . right leg removal     for diabetes, wears prosthesis    Social History  Substance Use Topics  . Smoking status: Former Games developer  . Smokeless tobacco: Former Neurosurgeon  . Alcohol use Yes     Comment: 1 quart every 3 days - 24 oz cans    Family History  Problem Relation Age of Onset  . Diabetes Maternal Uncle   . Diabetes Maternal Grandmother   . Colon cancer Neg Hx   . Colon polyps Neg Hx   . Rectal cancer Neg Hx   . Stomach cancer Neg Hx     No Known Allergies  Medication list has been reviewed and updated.  Current Outpatient Prescriptions on File Prior to Visit  Medication Sig  Dispense Refill  . amLODipine (NORVASC) 10 MG tablet TAKE 1 TABLET(10 MG) BY MOUTH DAILY 30 tablet 4  . azelastine (OPTIVAR) 0.05 % ophthalmic solution Place 1 drop into both eyes 2 (two) times daily. 6 mL 12  . B-D ULTRAFINE III SHORT PEN 31G X 8 MM MISC Reported on 12/19/2015  1  . carisoprodol (SOMA) 350 MG tablet Take 1 tablet (350 mg total) by mouth 3 (three) times daily. 90 tablet 0  . furosemide (LASIX) 40 MG tablet Take 1 tablet (40 mg total) by mouth daily. 90 tablet 0  . gabapentin (NEURONTIN) 300 MG capsule TAKE 2 CAPSULES BY MOUTH THREE TIMES DAILY (Patient taking differently: TAKE 3CAPSULES BY MOUTH two  TIMES DAILY) 540 capsule 3  . lisinopril (PRINIVIL,ZESTRIL) 10 MG tablet TAKE 1 TABLET BY MOUTH DAILY 90 tablet 1  . morphine (MS CONTIN) 15 MG 12 hr tablet Take 1 tablet (15 mg  total) by mouth every 12 (twelve) hours. 60 tablet 0  . ONETOUCH DELICA LANCETS 33G MISC Use to check blood sugar twice daily. 100 each 5  . ONETOUCH VERIO test strip USE TO TEST BLOOD GLUCOSE TWICE DAILY 100 each 0  . Oxycodone HCl 10 MG TABS Take 1 tablet (10 mg total) by mouth 2 (two) times daily as needed. 70 tablet 0  . sildenafil (REVATIO) 20 MG tablet Take 1 tablet by mouth as directed 30 minutes before intercourse 50 tablet 0  . simvastatin (ZOCOR) 10 MG tablet TAKE 1 TABLET BY MOUTH AT BEDTIME 90 tablet 1  . traZODone (DESYREL) 50 MG tablet Take 0.5 tablets (25 mg total) by mouth at bedtime as needed for sleep. 30 tablet 3  . venlafaxine (EFFEXOR) 75 MG tablet TAKE 1 TABLET(75 MG) BY MOUTH TWICE DAILY 180 tablet 0   No current facility-administered medications on file prior to visit.     Review of Systems:  As per HPI- otherwise negative.   No fever, chills, nausea, vomiting or diarrhea   Physical Examination: Vitals:   05/19/16 1126 05/19/16 1133  BP: (!) 148/92 132/84  Pulse: 89   Temp: 98.2 F (36.8 C)    Vitals:   05/19/16 1126  Weight: 211 lb 3.2 oz (95.8 kg)  Height: 5\' 7"  (1.702 m)   Body mass index is 33.08 kg/m. Ideal Body Weight: Weight in (lb) to have BMI = 25: 159.3  GEN: WDWN, NAD, Non-toxic, A & O x 3, overweight, looks well HEENT: Atraumatic, Normocephalic. Neck supple. No masses, No LAD. Ears and Nose: No external deformity. CV: RRR, No M/G/R. No JVD. No thrill. No extra heart sounds. PULM: CTA B, no wheezes, crackles, rhonchi. No retractions. No resp. distress. No accessory muscle use. ABD: S, NT, ND, +BS. No rebound. No HSM. EXTR: No c/c/e NEURO Normal gait for pt- right BKA PSYCH: Normally interactive. Conversant. Not depressed or anxious appearing.  Calm demeanor.    Assessment and Plan: Type II diabetes mellitus, well controlled (HCC) - Plan: Hemoglobin A1C, CBC  Chronic low back pain without sciatica, unspecified back pain laterality -  Plan: morphine (MS CONTIN) 15 MG 12 hr tablet, Oxycodone HCl 10 MG TABS  Transaminitis - Plan: Comprehensive metabolic panel  Chronic narcotic use  Follow-up on his labs for his liver and DM- will be in touch with him pending results Refilled his pain medications for next month, he will do a UDS today also. I think that his last UDS did not show his controlled substances as expected  He has given  me some records to place in this chart- reviewed and will scan for him   Meds ordered this encounter  Medications  . morphine (MS CONTIN) 15 MG 12 hr tablet    Sig: Take 1 tablet (15 mg total) by mouth every 12 (twelve) hours. May fill 06/03/16    Dispense:  60 tablet    Refill:  0  . Oxycodone HCl 10 MG TABS    Sig: Take 1 tablet (10 mg total) by mouth 2 (two) times daily as needed. May fill 06/03/16    Dispense:  70 tablet    Refill:  0    10 extra tablets given on days of procedures, he can take 1 tablet TID.     Signed Abbe AmsterdamJessica Caelen Higinbotham, MD

## 2016-05-19 NOTE — Patient Instructions (Signed)
Please go by the lab for blood and a urine drug screen today I will be in touch with your labs Please have your report from eye exam and colonoscopy sent to us!

## 2016-05-19 NOTE — Progress Notes (Signed)
Pre visit review using our clinic review tool, if applicable. No additional management support is needed unless otherwise documented below in the visit note. 

## 2016-05-20 ENCOUNTER — Encounter: Payer: Self-pay | Admitting: Family Medicine

## 2016-05-20 LAB — HEMOGLOBIN A1C
Hgb A1c MFr Bld: 5.7 % — ABNORMAL HIGH (ref <5.7)
Mean Plasma Glucose: 117 mg/dL

## 2016-05-25 NOTE — Progress Notes (Signed)
Order(s) created erroneously. Erroneous order ID: [id number]  Order moved by: Arzu Mcgaughey F  Order move date/time: 05/25/2016 2:30 PM  Source Patient: [id number]  Source Contact: 05/19/2016  Destination Patient: [id number]  Destination Contact: 10/10/2012 

## 2016-05-26 ENCOUNTER — Other Ambulatory Visit: Payer: Self-pay | Admitting: Physician Assistant

## 2016-05-26 NOTE — Telephone Encounter (Signed)
Relation to ZO:XWRUpt:self Call back number:[phone] Pharm[facility]: [id number] - [place], [place] - [address] [facility] [address] [phone] (Phone) [phone] (Fax)     Reason for call:  Patient states traZODone (DESYREL) 50 MG tablet is not working, patient states sleep pattern varies requesting to be switched back to ALPRAZolam Prudy Feeler(XANAX) 1 MG tablet, please advise

## 2016-05-26 NOTE — Telephone Encounter (Signed)
Called him- he changed to trazodone last month, about 3 weeks ago. He has increased from 25 to 50 mg on his own but notes that he still does not sleep well, wonders about going back to xanax.  Advised that I think trazodone is safer as he is also on soma, MS contin and oxy 10. He understands and will try 100 mg at bedtime- he will let me know how this does for him

## 2016-05-28 NOTE — Progress Notes (Signed)
Need orders in epic for 11-21- surgery, pre op is 11-16

## 2016-06-03 DIAGNOSIS — Z89511 Acquired absence of right leg below knee: Secondary | ICD-10-CM | POA: Diagnosis not present

## 2016-06-08 ENCOUNTER — Other Ambulatory Visit: Payer: Self-pay | Admitting: Physician Assistant

## 2016-06-08 ENCOUNTER — Ambulatory Visit: Payer: Self-pay | Admitting: General Surgery

## 2016-06-08 NOTE — Progress Notes (Signed)
  Dr Andrey CampanileWilson,-   PLEASE add surgical orders in epic---HAS pst APPT 06/10/16

## 2016-06-10 ENCOUNTER — Encounter (INDEPENDENT_AMBULATORY_CARE_PROVIDER_SITE_OTHER): Payer: Self-pay

## 2016-06-10 ENCOUNTER — Encounter (HOSPITAL_COMMUNITY)
Admission: RE | Admit: 2016-06-10 | Discharge: 2016-06-10 | Disposition: A | Discharge status: Home / Self Care | Payer: Medicare Other | Source: Ambulatory Visit | Attending: General Surgery | Admitting: General Surgery

## 2016-06-10 ENCOUNTER — Encounter (HOSPITAL_COMMUNITY): Payer: Self-pay

## 2016-06-10 DIAGNOSIS — K429 Umbilical hernia without obstruction or gangrene: Secondary | ICD-10-CM | POA: Insufficient documentation

## 2016-06-10 DIAGNOSIS — Z01812 Encounter for preprocedural laboratory examination: Secondary | ICD-10-CM | POA: Diagnosis not present

## 2016-06-10 HISTORY — DX: Inflammatory liver disease, unspecified: K75.9

## 2016-06-10 LAB — BASIC METABOLIC PANEL
Anion gap: 10 (ref 5–15)
BUN: 20 mg/dL (ref 6–20)
CALCIUM: 9.4 mg/dL (ref 8.9–10.3)
CHLORIDE: 103 mmol/L (ref 101–111)
CO2: 22 mmol/L (ref 22–32)
CREATININE: 1.25 mg/dL — AB (ref 0.61–1.24)
Glucose, Bld: 133 mg/dL — ABNORMAL HIGH (ref 65–99)
Potassium: 4.8 mmol/L (ref 3.5–5.1)
SODIUM: 135 mmol/L (ref 135–145)

## 2016-06-10 LAB — CBC WITH DIFFERENTIAL/PLATELET
Basophils Absolute: 0.1 10*3/uL (ref 0.0–0.1)
Basophils Relative: 1 %
EOS ABS: 0.3 10*3/uL (ref 0.0–0.7)
Eosinophils Relative: 5 %
HCT: 36.3 % — ABNORMAL LOW (ref 39.0–52.0)
HEMOGLOBIN: 11.8 g/dL — AB (ref 13.0–17.0)
Lymphocytes Relative: 27 %
Lymphs Abs: 1.8 10*3/uL (ref 0.7–4.0)
MCH: 29.2 pg (ref 26.0–34.0)
MCHC: 32.5 g/dL (ref 30.0–36.0)
MCV: 89.9 fL (ref 78.0–100.0)
MONOS PCT: 9 %
Monocytes Absolute: 0.6 10*3/uL (ref 0.1–1.0)
NEUTROS PCT: 58 %
Neutro Abs: 3.9 10*3/uL (ref 1.7–7.7)
PLATELETS: 168 10*3/uL (ref 150–400)
RBC: 4.04 MIL/uL — ABNORMAL LOW (ref 4.22–5.81)
RDW: 13.4 % (ref 11.5–15.5)
WBC: 6.6 10*3/uL (ref 4.0–10.5)

## 2016-06-10 LAB — GLUCOSE, CAPILLARY: Glucose-Capillary: 124 mg/dL — ABNORMAL HIGH (ref 65–99)

## 2016-06-10 NOTE — Pre-Procedure Instructions (Signed)
EKG 12'16 Epic. CBG= 124 today. Hgb A1C= 5.7 05-19-16 Epic.

## 2016-06-10 NOTE — Patient Instructions (Addendum)
Justin Stone  06/10/2016   Your procedure is scheduled on: 06-15-16  [facility]  [facility] to 3rd floor to  Short Stay Center at   0730 AM.  Call this number if you have problems the morning of surgery [phone]   Remember: ONLY 1 PERSON MAY GO WITH YOU TO SHORT STAY TO GET  READY MORNING OF YOUR SURGERY.  Do not eat food or drink liquids :After Midnight.     Take these medicines the morning of surgery with A SIP OF WATER: Amlodipine. Gabapentin. Morphine. Venlafaxine. Eye drops-usual. DO NOT TAKE ANY DIABETIC MEDICATIONS DAY OF YOUR SURGERY                               You may not have any metal on your body including hair pins and              piercings  Do not wear jewelry, make-up, lotions, powders or perfumes, deodorant             Do not wear nail polish.  Do not shave  48 hours prior to surgery.              Men may shave face and neck.   Do not bring valuables to the hospital. [facility] IS NOT             RESPONSIBLE   FOR VALUABLES.  Contacts, dentures or bridgework may not be worn into surgery.  Leave suitcase in the car. After surgery it may be brought to your room.     Patients discharged the day of surgery will not be allowed to drive home.  Name and phone number of your driver:  Special Instructions: N/A              Please read over the following fact sheets you were given: _____________________________________________________________________             [facility] - Pre[facility] Before surgery, you can play an important role.  Because skin is not sterile, your skin needs to be as free of germs as possible.  You can reduce the number of germs on your skin by washing with CHG (chlorahexidine gluconate) soap before surgery.  CHG is an antiseptic cleaner which kills germs and bonds with the skin to continue killing germs even after washing. Please DO NOT use if you have an allergy to CHG  or antibacterial soaps.  If your skin becomes reddened/irritated stop using the CHG and inform your nurse when you arrive at Short Stay. Do not shave (including legs and underarms) for at least 48 hours prior to the first CHG shower.  You may shave your face/neck. Please follow these instructions carefully:  1.  Shower with CHG Soap the night before surgery and the  morning of Surgery.  2.  If you choose to wash your hair, wash your hair first as usual with your  normal  shampoo.  3.  After you shampoo, rinse your hair and body thoroughly to remove the  shampoo.                           4.  Use CHG as you would any other liquid soap.  You can apply chg directly  to the skin and wash                       Gently with a scrungie or clean washcloth.  5.  Apply the CHG Soap to your body ONLY FROM THE NECK DOWN.   Do not use on face/ open                           Wound or open sores. Avoid contact with eyes, ears mouth and genitals (private parts).                       Wash face,  Genitals (private parts) with your normal soap.             6.  Wash thoroughly, paying special attention to the area where your surgery  will be performed.  7.  Thoroughly rinse your body with warm water from the neck down.  8.  DO NOT shower/wash with your normal soap after using and rinsing off  the CHG Soap.                9.  Pat yourself dry with a clean towel.            10.  Wear clean pajamas.            11.  Place clean sheets on your bed the night of your first shower and do not  sleep with pets. Day of Surgery : Do not apply any lotions/deodorants the morning of surgery.  Please wear clean clothes to the hospital/surgery center.  FAILURE TO FOLLOW THESE INSTRUCTIONS MAY RESULT IN THE CANCELLATION OF YOUR SURGERYHow to Manage Your Diabetes Before and After Surgery  Why is it important to control my blood sugar before and after surgery? . Improving blood sugar levels before and after surgery helps healing and  can limit problems. . A way of improving blood sugar control is eating a healthy diet by: o  Eating less sugar and carbohydrates o  Increasing activity/exercise o  Talking with your doctor about reaching your blood sugar goals . High blood sugars (greater than 180 mg/dL) can raise your risk of infections and slow your recovery, so you will need to focus on controlling your diabetes during the weeks before surgery. . Make sure that the doctor who takes care of your diabetes knows about your planned surgery including the date and location.  How do I manage my blood sugar before surgery? . Check your blood sugar at least 4 times a day, starting 2 days before surgery, to make sure that the level is not too high or low. o Check your blood sugar the morning of your surgery when you wake up and every 2 hours until you get to the Short Stay unit. . If your blood sugar is less than 70 mg/dL, you will need to treat for low blood sugar: o Do not take insulin. o Treat a low blood sugar (less than 70 mg/dL) with  cup of clear juice (cranberry or apple), 4 glucose tablets, OR glucose gel. o Recheck blood sugar in 15 minutes after treatment (to make sure it is greater than 70 mg/dL). If your blood sugar is not greater than 70 mg/dL on recheck, call [phone] for further instructions. . Report your blood sugar to the short stay nurse when you get to Short Stay.  . If you are admitted to  the hospital after surgery: o Your blood sugar will be checked by the staff and you will probably be given insulin after surgery (instead of oral diabetes medicines) to make sure you have good blood sugar levels. o The goal for blood sugar control after surgery is 80-180 mg/dL.   WHAT DO I DO ABOUT MY DIABETES MEDICATION?  Marland Kitchen Do not take oral diabetes medicines (pills) the morning of surgery.         . The day of surgery, do not take other diabetes injectables, including Byetta (exenatide), Bydureon (exenatide ER),  Victoza (liraglutide), or Trulicity (dulaglutide).  . If your CBG is greater than 220 mg/dL, you may take  of your sliding scale  . (correction) dose of insulin.     Patient Signature:  Date:   Nurse Signature:  Date:   Reviewed and Endorsed by [facility] Patient Education Committee, August 2015   ________________________________________________________________________

## 2016-06-10 NOTE — Progress Notes (Signed)
06-10-16 labs viewable in  Epic- note BMP.

## 2016-06-14 NOTE — Progress Notes (Signed)
Notified patient of new arrival time--to arrive 0815

## 2016-06-15 ENCOUNTER — Ambulatory Visit (HOSPITAL_COMMUNITY): Payer: Medicare Other | Admitting: Certified Registered Nurse Anesthetist

## 2016-06-15 ENCOUNTER — Encounter (HOSPITAL_COMMUNITY): Payer: Self-pay

## 2016-06-15 ENCOUNTER — Encounter (HOSPITAL_COMMUNITY)
Admission: RE | Disposition: A | Discharge status: Home / Self Care | Payer: Self-pay | Source: Ambulatory Visit | Attending: General Surgery

## 2016-06-15 ENCOUNTER — Ambulatory Visit (HOSPITAL_COMMUNITY)
Admission: RE | Admit: 2016-06-15 | Discharge: 2016-06-15 | Disposition: A | Discharge status: Home / Self Care | Payer: Medicare Other | Source: Ambulatory Visit | Attending: General Surgery | Admitting: General Surgery

## 2016-06-15 DIAGNOSIS — Z833 Family history of diabetes mellitus: Secondary | ICD-10-CM | POA: Insufficient documentation

## 2016-06-15 DIAGNOSIS — M545 Low back pain: Secondary | ICD-10-CM

## 2016-06-15 DIAGNOSIS — E785 Hyperlipidemia, unspecified: Secondary | ICD-10-CM | POA: Diagnosis not present

## 2016-06-15 DIAGNOSIS — Z7982 Long term (current) use of aspirin: Secondary | ICD-10-CM | POA: Insufficient documentation

## 2016-06-15 DIAGNOSIS — K219 Gastro-esophageal reflux disease without esophagitis: Secondary | ICD-10-CM | POA: Insufficient documentation

## 2016-06-15 DIAGNOSIS — F419 Anxiety disorder, unspecified: Secondary | ICD-10-CM | POA: Insufficient documentation

## 2016-06-15 DIAGNOSIS — G8929 Other chronic pain: Secondary | ICD-10-CM

## 2016-06-15 DIAGNOSIS — E119 Type 2 diabetes mellitus without complications: Secondary | ICD-10-CM

## 2016-06-15 DIAGNOSIS — Z87891 Personal history of nicotine dependence: Secondary | ICD-10-CM | POA: Insufficient documentation

## 2016-06-15 DIAGNOSIS — Z7984 Long term (current) use of oral hypoglycemic drugs: Secondary | ICD-10-CM | POA: Diagnosis not present

## 2016-06-15 DIAGNOSIS — I10 Essential (primary) hypertension: Secondary | ICD-10-CM | POA: Diagnosis not present

## 2016-06-15 DIAGNOSIS — M8580 Other specified disorders of bone density and structure, unspecified site: Secondary | ICD-10-CM | POA: Diagnosis not present

## 2016-06-15 DIAGNOSIS — K429 Umbilical hernia without obstruction or gangrene: Secondary | ICD-10-CM | POA: Insufficient documentation

## 2016-06-15 DIAGNOSIS — Z8619 Personal history of other infectious and parasitic diseases: Secondary | ICD-10-CM | POA: Diagnosis not present

## 2016-06-15 DIAGNOSIS — E1140 Type 2 diabetes mellitus with diabetic neuropathy, unspecified: Secondary | ICD-10-CM | POA: Diagnosis not present

## 2016-06-15 DIAGNOSIS — M1990 Unspecified osteoarthritis, unspecified site: Secondary | ICD-10-CM | POA: Insufficient documentation

## 2016-06-15 HISTORY — PX: INSERTION OF MESH: SHX5868

## 2016-06-15 HISTORY — PX: UMBILICAL HERNIA REPAIR: SHX196

## 2016-06-15 LAB — GLUCOSE, CAPILLARY
GLUCOSE-CAPILLARY: 119 mg/dL — AB (ref 65–99)
Glucose-Capillary: 124 mg/dL — ABNORMAL HIGH (ref 65–99)

## 2016-06-15 SURGERY — REPAIR, HERNIA, UMBILICAL, LAPAROSCOPIC
Anesthesia: General | Site: Abdomen

## 2016-06-15 MED ORDER — OXYCODONE HCL 10 MG PO TABS: 5.0000 mg | ORAL_TABLET | ORAL | 0 refills | Status: DC | PRN

## 2016-06-15 MED ORDER — LACTATED RINGERS IV SOLN
INTRAVENOUS | Status: DC | PRN
  Administered 2016-06-15: 09:00:00 via INTRAVENOUS

## 2016-06-15 MED ORDER — ACETAMINOPHEN 500 MG PO TABS
1000.0000 mg | ORAL_TABLET | ORAL | Status: AC
  Administered 2016-06-15: 1000 mg via ORAL
  Filled 2016-06-15: qty 2

## 2016-06-15 MED ORDER — OXYCODONE HCL 5 MG PO TABS
10.0000 mg | ORAL_TABLET | Freq: Once | ORAL | Status: AC
  Administered 2016-06-15: 10 mg via ORAL
  Filled 2016-06-15: qty 2

## 2016-06-15 MED ORDER — ROCURONIUM BROMIDE 50 MG/5ML IV SOSY
PREFILLED_SYRINGE | INTRAVENOUS | Status: DC | PRN
  Administered 2016-06-15: 10 mg via INTRAVENOUS
  Administered 2016-06-15: 40 mg via INTRAVENOUS

## 2016-06-15 MED ORDER — PROPOFOL 10 MG/ML IV BOLUS
INTRAVENOUS | Status: DC | PRN
  Administered 2016-06-15: 200 mg via INTRAVENOUS

## 2016-06-15 MED ORDER — KETOROLAC TROMETHAMINE 30 MG/ML IJ SOLN
INTRAMUSCULAR | Status: AC
  Filled 2016-06-15: qty 1

## 2016-06-15 MED ORDER — LACTATED RINGERS IV SOLN
INTRAVENOUS | Status: DC
Start: 2016-06-15 — End: 2016-06-15

## 2016-06-15 MED ORDER — 0.9 % SODIUM CHLORIDE (POUR BTL) OPTIME
TOPICAL | Status: DC | PRN
  Administered 2016-06-15: 1000 mL

## 2016-06-15 MED ORDER — BUPIVACAINE HCL (PF) 0.25 % IJ SOLN
INTRAMUSCULAR | Status: DC | PRN
  Administered 2016-06-15: 30 mL

## 2016-06-15 MED ORDER — LIDOCAINE 2% (20 MG/ML) 5 ML SYRINGE
INTRAMUSCULAR | Status: DC | PRN
  Administered 2016-06-15: 60 mg via INTRAVENOUS

## 2016-06-15 MED ORDER — ONDANSETRON HCL 4 MG/2ML IJ SOLN
INTRAMUSCULAR | Status: DC | PRN
  Administered 2016-06-15: 4 mg via INTRAVENOUS

## 2016-06-15 MED ORDER — GABAPENTIN 300 MG PO CAPS: 300.0000 mg | ORAL_CAPSULE | ORAL | Status: DC

## 2016-06-15 MED ORDER — CEFAZOLIN SODIUM-DEXTROSE 2-4 GM/100ML-% IV SOLN
2.0000 g | INTRAVENOUS | Status: AC
  Administered 2016-06-15: 2 g via INTRAVENOUS
  Filled 2016-06-15: qty 100

## 2016-06-15 MED ORDER — SUCCINYLCHOLINE CHLORIDE 200 MG/10ML IV SOSY
PREFILLED_SYRINGE | INTRAVENOUS | Status: AC
  Filled 2016-06-15: qty 10

## 2016-06-15 MED ORDER — PROPOFOL 10 MG/ML IV BOLUS
INTRAVENOUS | Status: AC
  Filled 2016-06-15: qty 20

## 2016-06-15 MED ORDER — KETOROLAC TROMETHAMINE 30 MG/ML IJ SOLN
INTRAMUSCULAR | Status: DC | PRN
  Administered 2016-06-15: 30 mg via INTRAVENOUS

## 2016-06-15 MED ORDER — SUGAMMADEX SODIUM 200 MG/2ML IV SOLN
INTRAVENOUS | Status: AC
  Filled 2016-06-15: qty 2

## 2016-06-15 MED ORDER — DEXAMETHASONE SODIUM PHOSPHATE 10 MG/ML IJ SOLN
INTRAMUSCULAR | Status: DC | PRN
  Administered 2016-06-15: 10 mg via INTRAVENOUS

## 2016-06-15 MED ORDER — FENTANYL CITRATE (PF) 250 MCG/5ML IJ SOLN
INTRAMUSCULAR | Status: AC
  Filled 2016-06-15: qty 5

## 2016-06-15 MED ORDER — HYDROMORPHONE HCL 1 MG/ML IJ SOLN
0.2500 mg | INTRAMUSCULAR | Status: DC | PRN
  Administered 2016-06-15 (×2): 0.5 mg via INTRAVENOUS

## 2016-06-15 MED ORDER — DEXAMETHASONE SODIUM PHOSPHATE 10 MG/ML IJ SOLN
INTRAMUSCULAR | Status: AC
  Filled 2016-06-15: qty 1

## 2016-06-15 MED ORDER — LIDOCAINE 2% (20 MG/ML) 5 ML SYRINGE
INTRAMUSCULAR | Status: AC
  Filled 2016-06-15: qty 5

## 2016-06-15 MED ORDER — HYDROMORPHONE HCL 1 MG/ML IJ SOLN
INTRAMUSCULAR | Status: AC
  Filled 2016-06-15: qty 1

## 2016-06-15 MED ORDER — ROCURONIUM BROMIDE 50 MG/5ML IV SOSY
PREFILLED_SYRINGE | INTRAVENOUS | Status: AC
  Filled 2016-06-15: qty 5

## 2016-06-15 MED ORDER — BUPIVACAINE HCL (PF) 0.25 % IJ SOLN
INTRAMUSCULAR | Status: AC
  Filled 2016-06-15: qty 30

## 2016-06-15 MED ORDER — LACTATED RINGERS IV SOLN
INTRAVENOUS | Status: DC
  Administered 2016-06-15: 1000 mL via INTRAVENOUS

## 2016-06-15 MED ORDER — ONDANSETRON HCL 4 MG/2ML IJ SOLN
INTRAMUSCULAR | Status: AC
  Filled 2016-06-15: qty 2

## 2016-06-15 MED ORDER — SUCCINYLCHOLINE CHLORIDE 200 MG/10ML IV SOSY
PREFILLED_SYRINGE | INTRAVENOUS | Status: DC | PRN
  Administered 2016-06-15: 100 mg via INTRAVENOUS

## 2016-06-15 MED ORDER — SUGAMMADEX SODIUM 200 MG/2ML IV SOLN
INTRAVENOUS | Status: DC | PRN
  Administered 2016-06-15: 200 mg via INTRAVENOUS

## 2016-06-15 MED ORDER — CEFAZOLIN SODIUM-DEXTROSE 2-4 GM/100ML-% IV SOLN
INTRAVENOUS | Status: AC
  Filled 2016-06-15: qty 100

## 2016-06-15 MED ORDER — FENTANYL CITRATE (PF) 250 MCG/5ML IJ SOLN
INTRAMUSCULAR | Status: DC | PRN
  Administered 2016-06-15: 50 ug via INTRAVENOUS
  Administered 2016-06-15: 100 ug via INTRAVENOUS
  Administered 2016-06-15 (×2): 50 ug via INTRAVENOUS

## 2016-06-15 SURGICAL SUPPLY — 44 items
APPLIER CLIP LOGIC TI 5 (MISCELLANEOUS) IMPLANT
BINDER ABDOMINAL 12 ML 46-62 (SOFTGOODS) ×3 IMPLANT
CABLE HIGH FREQUENCY MONO STRZ (ELECTRODE) ×3 IMPLANT
CHLORAPREP W/TINT 26ML (MISCELLANEOUS) ×3 IMPLANT
COVER SURGICAL LIGHT HANDLE (MISCELLANEOUS) IMPLANT
DECANTER SPIKE VIAL GLASS SM (MISCELLANEOUS) ×3 IMPLANT
DERMABOND ADVANCED (GAUZE/BANDAGES/DRESSINGS) ×2
DERMABOND ADVANCED .7 DNX12 (GAUZE/BANDAGES/DRESSINGS) ×1 IMPLANT
DEVICE SECURE STRAP 25 ABSORB (INSTRUMENTS) ×3 IMPLANT
DEVICE TROCAR PUNCTURE CLOSURE (ENDOMECHANICALS) IMPLANT
DRAIN CHANNEL 19F RND (DRAIN) IMPLANT
DRAPE INCISE IOBAN 66X45 STRL (DRAPES) IMPLANT
DRAPE UTILITY XL STRL (DRAPES) ×3 IMPLANT
ELECT PENCIL ROCKER SW 15FT (MISCELLANEOUS) ×3 IMPLANT
ELECT REM PT RETURN 9FT ADLT (ELECTROSURGICAL) ×3
ELECTRODE REM PT RTRN 9FT ADLT (ELECTROSURGICAL) ×1 IMPLANT
EVACUATOR SILICONE 100CC (DRAIN) IMPLANT
GOWN STRL REUS W/TWL XL LVL3 (GOWN DISPOSABLE) ×15 IMPLANT
GRASPER SUT TROCAR 14GX15 (MISCELLANEOUS) ×3 IMPLANT
IRRIG SUCT STRYKERFLOW 2 WTIP (MISCELLANEOUS)
IRRIGATION SUCT STRKRFLW 2 WTP (MISCELLANEOUS) IMPLANT
KIT BASIN OR (CUSTOM PROCEDURE TRAY) ×3 IMPLANT
L-HOOK LAP DISP 36CM (ELECTROSURGICAL)
LHOOK LAP DISP 36CM (ELECTROSURGICAL) IMPLANT
MARKER SKIN DUAL TIP RULER LAB (MISCELLANEOUS) ×3 IMPLANT
MESH VENTRALIGHT ST 4.5IN (Mesh General) ×3 IMPLANT
NEEDLE SPNL 22GX3.5 QUINCKE BK (NEEDLE) ×3 IMPLANT
PAD POSITIONING PINK XL (MISCELLANEOUS) IMPLANT
SCISSORS LAP 5X35 DISP (ENDOMECHANICALS) ×3 IMPLANT
SHEARS HARMONIC ACE PLUS 36CM (ENDOMECHANICALS) IMPLANT
SLEEVE XCEL OPT CAN 5 100 (ENDOMECHANICALS) ×3 IMPLANT
SUT ETHILON 2 0 PS N (SUTURE) IMPLANT
SUT MNCRL AB 4-0 PS2 18 (SUTURE) ×3 IMPLANT
SUT NOVA NAB GS-21 0 18 T12 DT (SUTURE) ×6 IMPLANT
SUT VIC AB 2-0 SH 27 (SUTURE) ×2
SUT VIC AB 2-0 SH 27X BRD (SUTURE) ×1 IMPLANT
TOWEL OR 17X26 10 PK STRL BLUE (TOWEL DISPOSABLE) ×3 IMPLANT
TOWEL OR NON WOVEN STRL DISP B (DISPOSABLE) ×3 IMPLANT
TRAY FOLEY W/METER SILVER 14FR (SET/KITS/TRAYS/PACK) IMPLANT
TRAY FOLEY W/METER SILVER 16FR (SET/KITS/TRAYS/PACK) IMPLANT
TRAY LAPAROSCOPIC (CUSTOM PROCEDURE TRAY) ×3 IMPLANT
TROCAR BLADELESS OPT 5 100 (ENDOMECHANICALS) ×6 IMPLANT
TROCAR XCEL NON-BLD 11X100MML (ENDOMECHANICALS) IMPLANT
TUBING INSUF HEATED (TUBING) ×3 IMPLANT

## 2016-06-15 NOTE — H&P (Signed)
Rolla PlateSheldon C Brueckner is an 55 y.o. male.   Chief Complaint: here for surgery HPI: The patient is a 55 year old male who presents with an umbilical hernia. He is referred by Marcelline MatesWilliam Martin PA-C for evaluation of umbilical hernia. He states that it has been present for about a year. It bothers him both cosmetically as well as the areas sensitive to touch. It always protrudes. He denies any prior abdominal surgery. He denies any nausea, vomiting, diarrhea or constipation. He does not smoke. He is a diabetic but his last A1c was 4.4. He denies chest pain, chest pressure, short of breath, orthopnea, paroxysmal nocturnal dyspnea.  Past Medical History:  Diagnosis Date  . Allergy    eye allergies  . Anxiety   . Arthritis   . Diabetes mellitus without complication (HCC)    takes oral meds now only  . GERD (gastroesophageal reflux disease)    past hx > 2 yrs ago  . Hepatitis    "Hepatitis C"- tx. with Harvoni- now testing negative.  . Hyperlipidemia   . Hypertension   . Neuromuscular disorder (HCC)    neuropathy hands/ feet.  . Osteopenia   . Prosthesis adjustment    right leg- below knee(weighs 10 lbs.)    Past Surgical History:  Procedure Laterality Date  . HERNIA REPAIR Left    LIH  . right leg removal     for diabetes, wears prosthesis -Right Below knee since '15    Family History  Problem Relation Age of Onset  . Diabetes Maternal Uncle   . Diabetes Maternal Grandmother   . Colon cancer Neg Hx   . Colon polyps Neg Hx   . Rectal cancer Neg Hx   . Stomach cancer Neg Hx    Social History:  reports that he quit smoking about 30 years ago. His smoking use included Cigarettes. He has a 20.00 pack-year smoking history. He has quit using smokeless tobacco. He reports that he drinks alcohol. He reports that he uses drugs, including Marijuana.  Allergies: No Known Allergies  Medications Prior to Admission  Medication Sig Dispense Refill  . amLODipine (NORVASC) 10 MG tablet TAKE 1  TABLET(10 MG) BY MOUTH DAILY 30 tablet 4  . aspirin EC 81 MG tablet Take 81 mg by mouth daily.    Marland Kitchen. azelastine (OPTIVAR) 0.05 % ophthalmic solution Place 1 drop into both eyes 2 (two) times daily. 6 mL 12  . carisoprodol (SOMA) 350 MG tablet Take 1 tablet (350 mg total) by mouth 3 (three) times daily. 90 tablet 0  . furosemide (LASIX) 40 MG tablet Take 1 tablet (40 mg total) by mouth daily. 90 tablet 0  . gabapentin (NEURONTIN) 300 MG capsule TAKE 2 CAPSULES BY MOUTH THREE TIMES DAILY (Patient taking differently: TAKE 3 CAPSULES (900 MG) BY MOUTH TWICE DAILY) 540 capsule 3  . lisinopril (PRINIVIL,ZESTRIL) 10 MG tablet TAKE 1 TABLET BY MOUTH DAILY 90 tablet 3  . metFORMIN (GLUCOPHAGE) 500 MG tablet Take 500 mg by mouth 2 (two) times daily.    Marland Kitchen. morphine (MS CONTIN) 15 MG 12 hr tablet Take 1 tablet (15 mg total) by mouth every 12 (twelve) hours. May fill 06/03/16 60 tablet 0  . naphazoline-glycerin (CLEAR EYES) 0.012-0.2 % SOLN Place 1-2 drops into both eyes 4 (four) times daily as needed for irritation.    Letta Pate. ONETOUCH DELICA LANCETS 33G MISC Use to check blood sugar twice daily. 100 each 5  . ONETOUCH VERIO test strip USE TO TEST BLOOD  GLUCOSE TWICE DAILY 100 each 0  . Oxycodone HCl 10 MG TABS Take 1 tablet (10 mg total) by mouth 2 (two) times daily as needed. May fill 06/03/16 (Patient taking differently: Take 10 mg by mouth 2 (two) times daily as needed (FOR BREAKTHROUGH PAIN). May fill 06/03/16) 70 tablet 0  . sildenafil (REVATIO) 20 MG tablet Take 1 tablet by mouth as directed 30 minutes before intercourse (Patient taking differently: Take 20 mg by mouth daily as needed (30 MINUTES PRIOR TO INTERCOURESE FOR ED). ) 50 tablet 0  . simvastatin (ZOCOR) 10 MG tablet TAKE 1 TABLET BY MOUTH AT BEDTIME 90 tablet 3  . traZODone (DESYREL) 50 MG tablet Take 0.5 tablets (25 mg total) by mouth at bedtime as needed for sleep. 30 tablet 3  . venlafaxine (EFFEXOR) 75 MG tablet TAKE 1 TABLET(75 MG) BY MOUTH TWICE  DAILY 180 tablet 0    Results for orders placed or performed during the hospital encounter of 06/15/16 (from the past 48 hour(s))  Glucose, capillary     Status: Abnormal   Collection Time: 06/15/16  7:55 AM  Result Value Ref Range   Glucose-Capillary 119 (H) 65 - 99 mg/dL   Comment 1 Notify RN    No results found.  Review of Systems  Constitutional: Negative for weight loss.  HENT: Negative for nosebleeds.   Eyes: Negative for blurred vision.  Respiratory: Negative for shortness of breath.   Cardiovascular: Negative for chest pain, palpitations, orthopnea and PND.       Denies DOE  Genitourinary: Negative for dysuria and hematuria.  Musculoskeletal: Negative.   Skin: Negative for itching and rash.  Neurological: Negative for dizziness, focal weakness, seizures, loss of consciousness and headaches.       Denies TIAs, amaurosis fugax  Endo/Heme/Allergies: Does not bruise/bleed easily.  Psychiatric/Behavioral: The patient is not nervous/anxious.     Blood pressure (!) 159/89, pulse 90, temperature 98.1 F (36.7 C), temperature source Oral, resp. rate 18, height 5\' 7"  (1.702 m), weight 103.9 kg (229 lb), SpO2 98 %. Physical Exam  Vitals reviewed. Constitutional: He is oriented to person, place, and time. He appears well-developed and well-nourished. No distress.  HENT:  Head: Normocephalic and atraumatic.  Right Ear: External ear normal.  Left Ear: External ear normal.  Eyes: Conjunctivae are normal. No scleral icterus.  Neck: Normal range of motion. Neck supple. No tracheal deviation present. No thyromegaly present.  Cardiovascular: Normal rate and normal heart sounds.   Respiratory: Effort normal and breath sounds normal. No stridor. No respiratory distress. He has no wheezes.  GI: Soft. He exhibits no distension. There is no tenderness.  Obese, soft, reducible umbilical hernia  Musculoskeletal: He exhibits no edema or tenderness.  Lymphadenopathy:    He has no cervical  adenopathy.  Neurological: He is alert and oriented to person, place, and time. He exhibits normal muscle tone.  Skin: Skin is warm and dry. No rash noted. He is not diaphoretic. No erythema. No pallor.  Psychiatric: He has a normal mood and affect. His behavior is normal. Judgment and thought content normal.     Assessment/Plan Umbilical hernia Dm htn hpl  To OR for lap assisted umbilical hernia repair with mesh All questions asked and answered  Mary SellaEric M. Andrey CampanileWilson, MD, FACS General, Bariatric, & Minimally Invasive Surgery [facility], [place]   Atilano InaWILSON,Raphaella Larkin M, MD 06/15/2016, 9:29 AM

## 2016-06-15 NOTE — Anesthesia Procedure Notes (Signed)
Procedure Name: Intubation Date/Time: 06/15/2016 9:49 AM Performed by: Delphia GratesHANDLER, Mang Hazelrigg Pre-anesthesia Checklist: Patient identified, Suction available, Patient being monitored and Emergency Drugs available Patient Re-evaluated:Patient Re-evaluated prior to inductionOxygen Delivery Method: Circle system utilized Preoxygenation: Pre-oxygenation with 100% oxygen Intubation Type: IV induction Laryngoscope Size: Mac and 4 Grade View: Grade I Tube type: Oral Laser Tube: Cuffed inflated with minimal occlusive pressure - saline Tube size: 7.5 mm Number of attempts: 1 Airway Equipment and Method: Stylet Placement Confirmation: ETT inserted through vocal cords under direct vision,  positive ETCO2 and breath sounds checked- equal and bilateral Secured at: 22 cm Tube secured with: Tape Dental Injury: Teeth and Oropharynx as per pre-operative assessment

## 2016-06-15 NOTE — Transfer of Care (Signed)
Immediate Anesthesia Transfer of Care Note  Patient: Justin Stone  Procedure(s) Performed: Procedure(s): LAPAROSCOPIC ASSISTED REPAIR OF  UMBILICAL HERNIA (N/A) INSERTION OF MESH (N/A)  Patient Location: PACU  Anesthesia Type:General  Level of Consciousness: awake, alert , oriented and patient cooperative  Airway & Oxygen Therapy: Patient Spontanous Breathing and Patient connected to face mask oxygen  Post-op Assessment: Report given to RN and Post -op Vital signs reviewed and stable  Post vital signs: Reviewed and stable  Last Vitals:  Vitals:   06/15/16 0805  BP: (!) 159/89  Pulse: 90  Resp: 18  Temp: 36.7 C    Last Pain:  Vitals:   06/15/16 0926  TempSrc:   PainSc: 3       Patients Stated Pain Goal: 4 (06/15/16 0926)  Complications: No apparent anesthesia complications

## 2016-06-15 NOTE — Op Note (Signed)
Justin Stone [id number] 27-May-1961 06/15/2016  Laparoscopic Assisted Umbilical Hernia Repair with mesh Procedure Note  Indications: Symptomatic umbilical hernia  Pre-operative Diagnosis: umbilical hernia  Post-operative Diagnosis: same  Surgeon: Atilano InaWILSON,Baylea Milburn M   Assistants: none  Anesthesia: General endotracheal anesthesia   Procedure Details  The patient was seen in the Holding Room. The risks, benefits, complications, treatment options, and expected outcomes were discussed with the patient. The possibilities of reaction to medication, pulmonary aspiration, perforation of viscus, bleeding, recurrent infection, the need for additional procedures, failure to diagnose a condition, and creating a complication requiring transfusion or operation were discussed with the patient. The patient concurred with the proposed plan, giving informed consent.  The site of surgery properly noted/marked. The patient was taken to the operating room, identified as Justin Stone and the procedure verified as laparoscopic assisted umbilical hernia repair with mesh. A Time Out was held and the above information confirmed.    The patient was placed supine.  After establishing general anesthesia, he was in and out catheterized.  The abdomen was prepped with Chloraprep and draped in standard fashion.   A curvilinear infraumbilical incision was created. Dissection was carried down to the hernia sac located above the fascia and was mobilized from surrounding structures. Intact fascia was identified circumferentially around the defect. Skin and soft tissue was mobilized from the surface of the fascia in a circumferential manner. A finger sweep was performed underneath the fascia to ensure there were no adhesions to the anterior abdominal wall. The defect measured 2 cm. We selected a round piece of Bard VentralightSt mesh 4in.  We placed four stay sutures of 0 Novofil around the edges of the mesh.  The mesh was then  rolled up and inserted through the umbilical fascial defect. The fascia was then closed primarily over the mesh with 4 interrupted 1-novafil sutures.  A 5 mm Optiview was used the cannulate the peritoneal cavity in the left upper quadrant below the costal margin.  Pneumoperitoneum was obtained by insufflating CO2, maintaining a maximum pressure of 15 mmHg.  The 5 mm 30-degree laparoscopic was inserted.  There were no significant omental adhesions to the anterior abdominal wall in and around the hernia defect which had been primarily repaired.  Another 5-mm port was placed in the left lower quadrant.     The mesh was then unrolled.  The stay sutures were then pulled up through small stab incisions using the PMI suture device.  This deployed the mesh widely over the fascial defects.  The stay sutures were then tied down.  The Secure Strap device was then used to tack down the edges of the mesh at 1 cm intervals circumferentially. There is a small hematoma on the right side of the abdomen at that trans fascial suture site but no evidence of ongoing bleeding. I inspected the Optiview entry site and there is no evidence of bowel injury. Pneumoperitoneum was then released as we removed the remainder of the trocars. The base of the umbilicus was then sutured to the umbilical fascia with 2 interrupted 3-0 Vicryl sutures. The deep dermis was then reapproximated with inverted interrupted 3-0 Vicryl sutures. The skin was closed with a 4-0 Monocryl. The port sites were closed with 4-0 Monocryl.  All of the incisions and stay suture sites were then sealed with Dermabond.  An abdominal binder was placed around the patient's abdomen.  The patient was extubated and brought to the recovery room in stable condition.  All sponge, instrument, and needle  counts were correct prior to closure and at the conclusion of the case.   Findings: Type of repair - primary suture with mesh underlay  (choices - primary suture, mesh, or  component)  Name of mesh - bard ventralightST  Size of mesh -round 4 inch  Mesh overlap - >5 cm  Placement of mesh -  beneath fascia and into peritoneal cavity,  (choices - beneath fascia and into peritoneal cavity, beneath fascia but external to peritoneal cavity, between the muscle and fascia, above or external to fascia)    Estimated Blood Loss:  less than 25 mL         Complications:  None; patient tolerated the procedure well.         Disposition: PACU - hemodynamically stable.         Condition: stable  Mary SellaEric M. Andrey CampanileWilson, MD, FACS General, Bariatric, & Minimally Invasive Surgery [facility], [place]

## 2016-06-15 NOTE — Interval H&P Note (Signed)
History and Physical Interval Note:  06/15/2016 9:35 AM  Rolla PlateSheldon C Gillott  has presented today for surgery, with the diagnosis of umbilical hernia  The various methods of treatment have been discussed with the patient and family. After consideration of risks, benefits and other options for treatment, the patient has consented to  Procedure(s): LAPAROSCOPIC ASSISTED REPAIR OF  UMBILICAL HERNIA (N/A) INSERTION OF MESH (N/A) as a surgical intervention .  The patient's history has been reviewed, patient examined, no change in status, stable for surgery.  I have reviewed the patient's chart and labs.  Questions were answered to the patient's satisfaction.    Mary SellaEric M. Andrey CampanileWilson, MD, FACS General, Bariatric, & Minimally Invasive Surgery [facility] [place]  [facility] LLC[facility]ILSON,Klara Stjames M

## 2016-06-15 NOTE — Discharge Instructions (Signed)
General Anesthesia, Adult, Care After These instructions provide you with information about caring for yourself after your procedure. Your health care provider may also give you more specific instructions. Your treatment has been planned according to current medical practices, but problems sometimes occur. Call your health care provider if you have any problems or questions after your procedure. What can I expect after the procedure? After the procedure, it is common to have:  Vomiting.  A sore throat.  Mental slowness. It is common to feel:  Nauseous.  Cold or shivery.  Sleepy.  Tired.  Sore or achy, even in parts of your body where you did not have surgery. Follow these instructions at home: For at least 24 hours after the procedure:  Do not:  Participate in activities where you could fall or become injured.  Drive.  Use heavy machinery.  Drink alcohol.  Take sleeping pills or medicines that cause drowsiness.  Make important decisions or sign legal documents.  Take care of children on your own.  Rest. Eating and drinking  If you vomit, drink water, juice, or soup when you can drink without vomiting.  Drink enough fluid to keep your urine clear or pale yellow.  Make sure you have little or no nausea before eating solid foods.  Follow the diet recommended by your health care provider. General instructions  Have a responsible adult stay with you until you are awake and alert.  Return to your normal activities as told by your health care provider. Ask your health care provider what activities are safe for you.  Take over-the-counter and prescription medicines only as told by your health care provider.  If you smoke, do not smoke without supervision.  Keep all follow-up visits as told by your health care provider. This is important. Contact a health care provider if:  You continue to have nausea or vomiting at home, and medicines are not helpful.  You  cannot drink fluids or start eating again.  You cannot urinate after 8-12 hours.  You develop a skin rash.  You have fever.  You have increasing redness at the site of your procedure. Get help right away if:  You have difficulty breathing.  You have chest pain.  You have unexpected bleeding.  You feel that you are having a life-threatening or urgent problem. This information is not intended to replace advice given to you by your health care provider. Make sure you discuss any questions you have with your health care provider. Document Released: 10/18/2000 Document Revised: 12/15/2015 Document Reviewed: 06/26/2015 Elsevier Interactive Patient Education  2017 [facility], [place]  UMBILICAL OR INGUINAL HERNIA REPAIR: POST OP INSTRUCTIONS  Always review your discharge instruction sheet given to you by the facility where your surgery was performed. IF YOU HAVE DISABILITY OR FAMILY LEAVE FORMS, YOU MUST BRING THEM TO THE OFFICE FOR PROCESSING.   DO NOT GIVE THEM TO YOUR DOCTOR.  1. A  prescription for pain medication may be given to you upon discharge.  Take your pain medication as prescribed, if needed.  If narcotic pain medicine is not needed, then you may take acetaminophen (Tylenol) or ibuprofen (Advil) as needed. 2. Take your usually prescribed medications unless otherwise directed. 3. If you need a refill on your pain medication, please contact your pharmacy.  They will contact our office to request authorization. Prescriptions will not be filled after 5 pm or on week-ends. 4. You should follow a light diet the first 24 hours after  arrival home, such as soup and crackers, etc.  Be sure to include lots of fluids daily.  Resume your normal diet the day after surgery. 5. Most patients will experience some swelling and bruising around the umbilicus or in the groin and scrotum.  Ice packs and reclining will help.  Swelling and bruising can take several days to  resolve.  6. It is common to experience some constipation if taking pain medication after surgery.  Increasing fluid intake and taking a stool softener (such as Colace) will usually help or prevent this problem from occurring.  A mild laxative (Milk of Magnesia or Miralax) should be taken according to package directions if there are no bowel movements after 48 hours. 7.   If your surgeon used skin glue on the incision, you may shower in 24 hours.  The glue will flake off over the next 2-3 weeks.  Any sutures or staples will be removed at the office during your follow-up visit. 8. ACTIVITIES:  You may resume regular (light) daily activities beginning the next day--such as daily self-care, walking, climbing stairs--gradually increasing activities as tolerated.  You may have sexual intercourse when it is comfortable.  Refrain from any heavy lifting or straining until approved by your doctor. a. You may drive when you are no longer taking prescription pain medication, you can comfortably wear a seatbelt, and you can safely maneuver your car and apply brakes. b. RETURN TO WORK:  9. You should see your doctor in the office for a follow-up appointment approximately 2-3 weeks after your surgery.  Make sure that you call for this appointment within a day or two after you arrive home to insure a convenient appointment time. 10. OTHER INSTRUCTIONS: DO NOT LIFT, PUSH, OR PULL ANYTHING GREATER THAN 15 POUNDS FOR AT LEAST 4 WEEKS 11. WEAR ABDOMINAL BINDER DAILY. Don't have to wear while sleeping.     WHEN TO CALL YOUR DOCTOR: 1. Fever over 101.0 2. Inability to urinate 3. Nausea and/or vomiting 4. Extreme swelling or bruising 5. Continued bleeding from incision. 6. Increased pain, redness, or drainage from the incision  The clinic staff is available to answer your questions during regular business hours.  Please dont hesitate to call and ask to speak to one of the nurses for clinical concerns.  If you have a  medical emergency, go to the nearest emergency room or call 911.  A surgeon from [facility] is always on call at the hospital   [address], [address], [place], [place]  [address] ?  [address] [address], [place], [place]   [address] [phone] ? [phone] ? FAX [phone] Web site: [email or web address]

## 2016-06-15 NOTE — Anesthesia Preprocedure Evaluation (Signed)
Anesthesia Evaluation  Patient identified by MRN, date of birth, ID band Patient awake    Reviewed: Allergy & Precautions, H&P , Patient's Chart, lab work & pertinent test results  Airway Mallampati: II  TM Distance: >3 FB Neck ROM: full    Dental no notable dental hx.    Pulmonary former smoker,    Pulmonary exam normal breath sounds clear to auscultation       Cardiovascular Exercise Tolerance: Good hypertension,  Rhythm:regular Rate:Normal     Neuro/Psych    GI/Hepatic   Endo/Other  diabetes  Renal/GU      Musculoskeletal   Abdominal   Peds  Hematology   Anesthesia Other Findings Hypertension  GERD   DM  Neuropathy hands/ feet. Hepatitis  Etoh abuse- cirrhosis  "Hepatitis C"- tx. with Harvoni- now testing negative.       Reproductive/Obstetrics                             Anesthesia Physical Anesthesia Plan  ASA: III  Anesthesia Plan: General   Post-op Pain Management:    Induction: Intravenous  Airway Management Planned: Oral ETT  Additional Equipment:   Intra-op Plan:   Post-operative Plan: Extubation in OR  Informed Consent: I have reviewed the patients History and Physical, chart, labs and discussed the procedure including the risks, benefits and alternatives for the proposed anesthesia with the patient or authorized representative who has indicated his/her understanding and acceptance.   Dental Advisory Given and Dental advisory given  Plan Discussed with: CRNA and Surgeon  Anesthesia Plan Comments: (  Discussed general anesthesia, including possible nausea, instrumentation of airway, sore throat,pulmonary aspiration, etc. I asked if the were any outstanding questions, or  concerns before we proceeded. )        Anesthesia Quick Evaluation

## 2016-06-15 NOTE — Anesthesia Postprocedure Evaluation (Signed)
Anesthesia Post Note  Patient: Rolla PlateSheldon C Paulson  Procedure(s) Performed: Procedure(s) (LRB): LAPAROSCOPIC ASSISTED REPAIR OF  UMBILICAL HERNIA (N/A) INSERTION OF MESH (N/A)  Patient location during evaluation: PACU Anesthesia Type: General Level of consciousness: awake and alert Pain management: pain level controlled Vital Signs Assessment: post-procedure vital signs reviewed and stable Respiratory status: spontaneous breathing, nonlabored ventilation, respiratory function stable and patient connected to nasal cannula oxygen Cardiovascular status: blood pressure returned to baseline and stable Postop Assessment: no signs of nausea or vomiting Anesthetic complications: no    Last Vitals:  Vitals:   06/15/16 1200 06/15/16 1210  BP: (!) 174/97 (!) 155/89  Pulse:  85  Resp:  15  Temp: 37 C 37.2 C    Last Pain:  Vitals:   06/15/16 1219  TempSrc:   PainSc: 2                  Giankarlo Leamer S

## 2016-06-16 ENCOUNTER — Encounter (HOSPITAL_COMMUNITY): Payer: Self-pay | Admitting: General Surgery

## 2016-06-21 ENCOUNTER — Telehealth: Payer: Self-pay | Admitting: Family Medicine

## 2016-06-21 NOTE — Telephone Encounter (Signed)
Called pt to discuss the prosthesis order that I got from [employer].  I am not sure how to fill this out- it asks for details that are beyond my training.  He gave me the phone number for the person from [facility]" who is helping him with his new leg.  I called him, and faxed him the form.  He will look it over and help me with completion of the form as needed

## 2016-06-24 ENCOUNTER — Other Ambulatory Visit: Payer: Self-pay | Admitting: Family

## 2016-06-24 DIAGNOSIS — E119 Type 2 diabetes mellitus without complications: Secondary | ICD-10-CM | POA: Diagnosis not present

## 2016-06-24 DIAGNOSIS — H5203 Hypermetropia, bilateral: Secondary | ICD-10-CM | POA: Diagnosis not present

## 2016-06-24 DIAGNOSIS — H2513 Age-related nuclear cataract, bilateral: Secondary | ICD-10-CM | POA: Diagnosis not present

## 2016-06-24 DIAGNOSIS — H40013 Open angle with borderline findings, low risk, bilateral: Secondary | ICD-10-CM | POA: Diagnosis not present

## 2016-06-24 DIAGNOSIS — H524 Presbyopia: Secondary | ICD-10-CM | POA: Diagnosis not present

## 2016-06-25 NOTE — Telephone Encounter (Signed)
Refill sent per [facility] refill protocol/SLS  

## 2016-06-28 ENCOUNTER — Telehealth: Payer: Self-pay | Admitting: *Deleted

## 2016-06-28 NOTE — Telephone Encounter (Signed)
I messaged Dr Andrey CampanileWilson, who did the hernia repair, and he has no objection.  Proceed as scheduled.  Thanks for checking.  - HD

## 2016-06-28 NOTE — Telephone Encounter (Signed)
Dr Myrtie Neitheranis,  This gentleman is scheduled for a colon 12-21 Thursday . He had an umbilical hernia repair with mesh insertion 11-21.  He has no GI history.   Can we proceed or should he wait longer between procedures.  Please advise and thanks for your time,  Elizebeth BrookingMarie PV

## 2016-06-29 NOTE — Telephone Encounter (Signed)
Ok to proceed with colon as scheduled per Dr Myrtie Neitheranis. Left message for pt. Justin Stone

## 2016-06-30 ENCOUNTER — Other Ambulatory Visit: Payer: Self-pay | Admitting: Emergency Medicine

## 2016-06-30 ENCOUNTER — Telehealth: Payer: Self-pay | Admitting: Family Medicine

## 2016-06-30 DIAGNOSIS — M545 Low back pain: Principal | ICD-10-CM

## 2016-06-30 DIAGNOSIS — G8929 Other chronic pain: Secondary | ICD-10-CM

## 2016-06-30 MED ORDER — OXYCODONE HCL 10 MG PO TABS: 5.0000 mg | ORAL_TABLET | ORAL | 0 refills | Status: DC | PRN

## 2016-06-30 MED ORDER — MORPHINE SULFATE ER 15 MG PO TBCR: 15.0000 mg | EXTENDED_RELEASE_TABLET | Freq: Two times a day (BID) | ORAL | 0 refills | Status: DC

## 2016-06-30 MED ORDER — CARISOPRODOL 350 MG PO TABS: 350.0000 mg | ORAL_TABLET | Freq: Three times a day (TID) | ORAL | 0 refills | Status: DC

## 2016-06-30 NOTE — Telephone Encounter (Signed)
°  Relation to QM:VHQIpt:self Call back number:[phone]   Reason for call:  Patient requesting a refill Oxycodone HCl 10 MG TABS, carisoprodol (SOMA) 350 MG tablet and morphine (MS CONTIN) 15 MG 12 hr tablet

## 2016-06-30 NOTE — Telephone Encounter (Signed)
Received refill request for morphine (MS CONTIN) 15 MG, Oxycodone HCI 10 MG, carisoprodol (SOMA). Last office visit 05/19/16. Last refill on carisoprodol 03/2016, morphine 04/2016, and Oxycodone 05/2016. Is it ok to refill? Please advise.

## 2016-06-30 NOTE — Telephone Encounter (Signed)
Received refill request- he did get a small supply of oxycodone from Dr. Andrey CampanileWilson around the time of his recent umbilical hernia repair which is ok Will refill for him- he needs a UDS however.  Will ask for this on pick up of rx

## 2016-07-01 ENCOUNTER — Ambulatory Visit (AMBULATORY_SURGERY_CENTER): Payer: Self-pay | Admitting: *Deleted

## 2016-07-01 VITALS — Ht 67.0 in | Wt 244.0 lb

## 2016-07-01 DIAGNOSIS — Z1211 Encounter for screening for malignant neoplasm of colon: Secondary | ICD-10-CM

## 2016-07-01 MED ORDER — NA SULFATE-K SULFATE-MG SULF 17.5-3.13-1.6 GM/177ML PO SOLN
1.0000 | Freq: Once | ORAL | 0 refills | Status: AC
Start: 2016-07-01 — End: 2016-07-01

## 2016-07-01 NOTE — Progress Notes (Addendum)
Reviewed prep instructions with pt, 2 day prep with miralax/suprep. Pt verbalized understanding, advised pt to pick prep up today 07/01/16 and sit in on counter til ready to use.  Advised pt to call office by 12/18 if any complications from hernia repair take place between now and procedure, pt stated today 07/01/16 he is still real sore and has a lot of pain, hernia repair surgery was on 06/15/16.

## 2016-07-01 NOTE — Progress Notes (Signed)
No egg or soy allergy. No anesthesia problems.  No home O2.  No diet meds.  

## 2016-07-02 ENCOUNTER — Telehealth: Payer: Self-pay | Admitting: Family Medicine

## 2016-07-02 NOTE — Telephone Encounter (Signed)
Caller name: Relationship to patient: [facility] Can be reached:[phone] Pharmacy:  Reason for call: Pharmacy called to see if provider is aware that patient received a Rx for Oxycodone on 11/21 from Dr. Gaynelle AduEric Wilson for 30 tablets. Needs to know if it is still okay to fill the Rx given by provider

## 2016-07-02 NOTE — Telephone Encounter (Signed)
Yes- he had a hernia repair.  Called pharmacy

## 2016-07-02 NOTE — Telephone Encounter (Signed)
Ok to fill, but will change sig to reflect usual dosing of 1/2 or 1 every 8 hours as needed

## 2016-07-05 ENCOUNTER — Other Ambulatory Visit: Payer: Self-pay | Admitting: Emergency Medicine

## 2016-07-05 ENCOUNTER — Telehealth: Payer: Self-pay | Admitting: Family Medicine

## 2016-07-05 MED ORDER — GABAPENTIN 300 MG PO CAPS: ORAL_CAPSULE | ORAL | 6 refills | Status: DC

## 2016-07-05 NOTE — Telephone Encounter (Signed)
Relation to ZO:XWRUpt:self Call back number:[phone] Pharmacy: [id number] - [place], [place] - [address] [facility] [address] [phone] (Phone) [phone] (Fax)     Reason for call:  Patient is completely out of gabapentin (NEURONTIN) 300 MG capsule and states refuses to refill stating he's not due, patient states he takes 3 pills a day, please advise

## 2016-07-05 NOTE — Telephone Encounter (Signed)
Note from [place] as follows  Spoke to pt who is now out of his gabapentin (NEURONTIN) 300 MG capsule. Pt states that his rx was changed before Malva Coganody Martin, PA-C left. Pt states that he takes 3 gabapentin in the AM, 2 during lunch and 3 more in the PM for a total of 7 capsules daily.    Will rx 30 day supply with refills to reflect dosing as above

## 2016-07-15 ENCOUNTER — Encounter: Payer: Medicare Other | Admitting: Gastroenterology

## 2016-07-15 ENCOUNTER — Other Ambulatory Visit: Payer: Self-pay | Admitting: Physician Assistant

## 2016-07-19 ENCOUNTER — Other Ambulatory Visit: Payer: Self-pay | Admitting: Physician Assistant

## 2016-07-22 DIAGNOSIS — E1140 Type 2 diabetes mellitus with diabetic neuropathy, unspecified: Secondary | ICD-10-CM | POA: Diagnosis not present

## 2016-08-02 ENCOUNTER — Ambulatory Visit (INDEPENDENT_AMBULATORY_CARE_PROVIDER_SITE_OTHER): Payer: Medicare Other | Admitting: Family Medicine

## 2016-08-02 ENCOUNTER — Telehealth: Payer: Self-pay | Admitting: Emergency Medicine

## 2016-08-02 ENCOUNTER — Encounter: Payer: Self-pay | Admitting: Family Medicine

## 2016-08-02 VITALS — BP 132/78 | HR 102 | Temp 97.9°F | Ht 67.0 in | Wt 243.0 lb

## 2016-08-02 DIAGNOSIS — M545 Low back pain: Secondary | ICD-10-CM

## 2016-08-02 DIAGNOSIS — G8929 Other chronic pain: Secondary | ICD-10-CM

## 2016-08-02 DIAGNOSIS — E119 Type 2 diabetes mellitus without complications: Secondary | ICD-10-CM

## 2016-08-02 DIAGNOSIS — Z1211 Encounter for screening for malignant neoplasm of colon: Secondary | ICD-10-CM

## 2016-08-02 MED ORDER — CARISOPRODOL 350 MG PO TABS: 350.0000 mg | ORAL_TABLET | Freq: Three times a day (TID) | ORAL | 0 refills | Status: DC

## 2016-08-02 MED ORDER — OXYCODONE HCL 10 MG PO TABS: 5.0000 mg | ORAL_TABLET | ORAL | 0 refills | Status: DC | PRN

## 2016-08-02 MED ORDER — MORPHINE SULFATE ER 15 MG PO TBCR: 15.0000 mg | EXTENDED_RELEASE_TABLET | Freq: Two times a day (BID) | ORAL | 0 refills | Status: DC

## 2016-08-02 NOTE — Telephone Encounter (Signed)
Faxed Cologuard order Requisition form to [facility].

## 2016-08-02 NOTE — Progress Notes (Signed)
[facility] [address], [place], [place] [address] [phone]  Date:  08/02/2016   Name:  Justin Stone   DOB:  Jun 06, 1961   MRN:  [id number]  PCP:  Lamar Blinks, MD    Chief Complaint: Follow-up (Pt is here for med f/u. Pt will need refills on Soma, morphine and Oxycodone. )   History of Present Illness:  Justin Stone is a 56 y.o. very pleasant male patient who presents with the following:  Last seen by myself about 2.5 months ago to establish care as his prior PCP Justin Stone has moved to a new location History of chronic low back pain, s/o BKA with phantom pain, DM 2, HTN, hyperlipidemia  He enjoyed his holidays- he relaxed and spent time at home.  He really had a nice time.  His brother has been living with him but he will be moving soon which is good news.  Mavrick would like to have his place to himself again.    He is on effexor, trazodone for sleep, zocor, metformin, lisinopril  Last eye exam: last month Discussed colon cancer screening- he thinks he did cologuard. [facility] and they do not have any record of this- it seems he did not do the cologuard test after all but probably an occult blood test.  Discussed options and he would like to have cologuard testing He did have an umbilical hernia repair done on 11/21- he is recovering well from this  Lab Results  Component Value Date   HGBA1C 5.7 (H) 05/19/2016   Most recent A1c shows good control Pulse Readings from Last 3 Encounters:  08/02/16 (!) 113  06/15/16 94  06/10/16 96   He needs a controlled sub contract and UDS today   NCCSR: reviewed today  Indication for chronic opioid: chronic back and phantom limb pain Medication and dose: see below- several  # pills per month: soma 350, 90/ oxy 10, 70/ MSIR 15, 60 Last UDS date: overdue, will get today Pain contract signed (Y/N): today Date narcotic database last reviewed (include red flags):  today.  Nothing unexpected   Patient Active Problem List   Diagnosis Date Noted  . Insomnia 05/13/2016  . Umbilical hernia without obstruction and without gangrene 05/13/2016  . Annual physical exam 05/13/2016  . Encounter for immunization 05/13/2016  . Colon cancer screening 02/06/2016  . Low serum sodium 11/03/2015  . Spondylosis of lumbar region without myelopathy or radiculopathy 07/22/2015  . Obesity 06/29/2015  . 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 Type II diabetes mellitus, well controlled ([place]) 03/26/2015  . S/P BKA (below knee amputation) ([place]) 03/26/2015  . Alcoholic cirrhosis of liver without ascites ([place]) 02/22/2015  . Chronic hepatitis C without hepatic coma ([place]) 02/22/2015  . Chronic low back pain 12/29/2014  . Essential hypertension, benign 12/29/2014  . Hyperlipidemia LDL goal <100 12/29/2014    Past Medical History:  Diagnosis Date  . Allergy    eye allergies  . Anxiety   . Arthritis   . Diabetes mellitus without complication ([place])    takes oral meds now only  . GERD (gastroesophageal reflux disease)    past hx > 2 yrs ago  . Hepatitis    "Hepatitis C"- tx. with Harvoni- now testing negative.  . Hyperlipidemia   . Hypertension   . Neuromuscular disorder ([facility])    neuropathy hands/ feet.  . Osteopenia   . Prosthesis  adjustment    right leg- below knee(weighs 10 lbs.)    Past Surgical History:  Procedure Laterality Date  . HERNIA REPAIR Left    LIH  . INSERTION OF MESH N/A 06/15/2016   Procedure: INSERTION OF MESH;  Surgeon: Greer Pickerel, MD;  Location: [facility];  Service: General;  Laterality: N/A;  . right leg removal     for diabetes, wears prosthesis -Right Below knee since '15  . UMBILICAL HERNIA REPAIR N/A 06/15/2016   Procedure: LAPAROSCOPIC ASSISTED REPAIR OF  UMBILICAL HERNIA;  Surgeon: Greer Pickerel, MD;  Location: [facility];  Service: General;  Laterality: N/A;    Social History  Substance Use Topics  . Smoking  status: Former Smoker    Packs/day: 1.00    Years: 20.00    Types: Cigarettes    Quit date: 06/10/1986  . Smokeless tobacco: Never Used  . Alcohol use Yes     Comment: 1 quart every 3 days - 24 oz cans    Family History  Problem Relation Age of Onset  . Diabetes Maternal Uncle   . Diabetes Maternal Grandmother   . Colon cancer Neg Hx   . Colon polyps Neg Hx   . Rectal cancer Neg Hx   . Stomach cancer Neg Hx   . Esophageal cancer Neg Hx     No Known Allergies  Medication list has been reviewed and updated.  Current Outpatient Prescriptions on File Prior to Visit  Medication Sig Dispense Refill  . amLODipine (NORVASC) 10 MG tablet TAKE 1 TABLET(10 MG) BY MOUTH DAILY 30 tablet 4  . aspirin EC 81 MG tablet Take 81 mg by mouth daily.    Marland Kitchen azelastine (OPTIVAR) 0.05 % ophthalmic solution Place 1 drop into both eyes 2 (two) times daily. 6 mL 12  . bisacodyl (DULCOLAX) 5 MG EC tablet Take 5 mg by mouth daily as needed for moderate constipation.    . carisoprodol (SOMA) 350 MG tablet Take 1 tablet (350 mg total) by mouth 3 (three) times daily. 90 tablet 0  . furosemide (LASIX) 40 MG tablet TAKE 1 TABLET(40 MG) BY MOUTH DAILY 90 tablet 0  . gabapentin (NEURONTIN) 300 MG capsule Take 3 in the morning, 2 at noon and 3 in the evening; 8 total daily 240 capsule 6  . glucose blood (ONETOUCH VERIO) test strip Dx: E11.9 100 each 5  . lisinopril (PRINIVIL,ZESTRIL) 10 MG tablet TAKE 1 TABLET BY MOUTH DAILY 90 tablet 3  . metFORMIN (GLUCOPHAGE) 500 MG tablet Take 500 mg by mouth 2 (two) times daily.    Marland Kitchen morphine (MS CONTIN) 15 MG 12 hr tablet Take 1 tablet (15 mg total) by mouth every 12 (twelve) hours. 60 tablet 0  . NARCAN 4 MG/0.1ML LIQD nasal spray kit CALL 911 AND U 1 SPR IN 1 NOS . REPEAT AFTRER 3 MIN IF NO OR MINIMAL RESPONSE  0  . ONETOUCH DELICA LANCETS 19X MISC Use to check blood sugar twice daily. 100 each 5  . Oxycodone HCl 10 MG TABS Take 0.5-1 tablets (5-10 mg total) by mouth every  4 (four) hours as needed. This is a 30 day supply 70 tablet 0  . Polyethyl Glycol-Propyl Glycol (SYSTANE OP) Apply to eye.    . polyethylene glycol powder (GLYCOLAX/MIRALAX) powder Take 1 Container by mouth once.    . sildenafil (REVATIO) 20 MG tablet Take 1 tablet by mouth as directed 30 minutes before intercourse 50 tablet 0  . simvastatin (ZOCOR) 10 MG tablet TAKE  1 TABLET BY MOUTH AT BEDTIME 90 tablet 3  . traZODone (DESYREL) 50 MG tablet Take 0.5 tablets (25 mg total) by mouth at bedtime as needed for sleep. 30 tablet 3  . venlafaxine (EFFEXOR) 75 MG tablet TAKE 1 TABLET(75 MG) BY MOUTH TWICE DAILY 180 tablet 1   No current facility-administered medications on file prior to visit.     Review of Systems:  As per HPI- otherwise negative. He notes that his blood sugars have been under good control at home Denies CP, SOB.  He still has some pain from his recent abd operation but this is getting better.  His other chronic pain is at baseline.   No fever, rash, nausea, vomiting, diarrhea   Physical Examination: Vitals:   08/02/16 1021  BP: 132/78  Pulse: (!) 113  Temp: 97.9 F (36.6 C)   Vitals:   08/02/16 1021  Weight: 243 lb (110.2 kg)  Height: _0  (1.702 m)   Body mass index is 38.06 kg/m. Ideal Body Weight: Weight in (lb) to have BMI = 25: 159.3  GEN: WDWN, NAD, Non-toxic, A & O x 3 HEENT: Atraumatic, Normocephalic. Neck supple. No masses, No LAD. Ears and Nose: No external deformity. CV: RRR, No M/G/R. No JVD. No thrill. No extra heart sounds. PULM: CTA B, no wheezes, crackles, rhonchi. No retractions. No resp. distress. No accessory muscle use. ABD: S, NT, ND, +BS. No rebound. No HSM. EXTR: No c/c/e NEURO Normal gait.   Right BKA PSYCH: Normally interactive. Conversant. Not depressed or anxious appearing.  Calm demeanor.   Wt Readings from Last 3 Encounters:  08/02/16 243 lb (110.2 kg)  07/01/16 244 lb (110.7 kg)  06/15/16 229 lb (103.9 kg)    Assessment and  Plan: Type II diabetes mellitus, well controlled ([place])  Chronic low back pain without sciatica, unspecified back pain laterality - Plan: morphine (MS CONTIN) 15 MG 12 hr tablet, carisoprodol (SOMA) 350 MG tablet, Oxycodone HCl 10 MG TABS  Colon cancer screening   UDS today, new controlled sub contract today. cologuard form completed for him as he is overdue for screening His DM has been under good control- less than 90 days since last A1c so will not repeat yet.  Plan to do this in 2 months, continue metformin and watch diet  Meds ordered this encounter  Medications  . morphine (MS CONTIN) 15 MG 12 hr tablet    Sig: Take 1 tablet (15 mg total) by mouth every 12 (twelve) hours.    Dispense:  60 tablet    Refill:  0  . carisoprodol (SOMA) 350 MG tablet    Sig: Take 1 tablet (350 mg total) by mouth 3 (three) times daily.    Dispense:  90 tablet    Refill:  0  . Oxycodone HCl 10 MG TABS    Sig: Take 0.5-1 tablets (5-10 mg total) by mouth every 4 (four) hours as needed. This is a 30 day supply    Dispense:  70 tablet    Refill:  0    Signed Lamar Blinks, MD

## 2016-08-02 NOTE — Progress Notes (Signed)
Pre visit review using our clinic review tool, if applicable. No additional management support is needed unless otherwise documented below in the visit note. 

## 2016-08-02 NOTE — Patient Instructions (Addendum)
It was very nice to see you today- please let me know when you are due for refills in about one month.   Please do a drug screen (urine test) for me today Please see me in about 2 months so we can check your A1c (diabetes test)  We will arrange a Cologuard test for you -this is screening for colon cancer that you can do at home.

## 2016-08-03 ENCOUNTER — Encounter: Payer: Self-pay | Admitting: Family Medicine

## 2016-08-03 DIAGNOSIS — Z79891 Long term (current) use of opiate analgesic: Secondary | ICD-10-CM | POA: Diagnosis not present

## 2016-08-03 DIAGNOSIS — Z79899 Other long term (current) drug therapy: Secondary | ICD-10-CM | POA: Diagnosis not present

## 2016-08-08 ENCOUNTER — Other Ambulatory Visit: Payer: Self-pay | Admitting: Physician Assistant

## 2016-08-09 NOTE — Telephone Encounter (Signed)
Will defer to new PCP

## 2016-08-13 ENCOUNTER — Ambulatory Visit (HOSPITAL_BASED_OUTPATIENT_CLINIC_OR_DEPARTMENT_OTHER): Payer: Medicare Other | Admitting: Physical Medicine & Rehabilitation

## 2016-08-13 ENCOUNTER — Encounter: Payer: Medicare Other | Attending: Physical Medicine & Rehabilitation

## 2016-08-13 ENCOUNTER — Encounter: Payer: Self-pay | Admitting: Physical Medicine & Rehabilitation

## 2016-08-13 VITALS — BP 157/89 | HR 96 | Resp 14

## 2016-08-13 DIAGNOSIS — I10 Essential (primary) hypertension: Secondary | ICD-10-CM | POA: Diagnosis not present

## 2016-08-13 DIAGNOSIS — Z89511 Acquired absence of right leg below knee: Secondary | ICD-10-CM

## 2016-08-13 DIAGNOSIS — M47816 Spondylosis without myelopathy or radiculopathy, lumbar region: Secondary | ICD-10-CM | POA: Diagnosis not present

## 2016-08-13 DIAGNOSIS — E119 Type 2 diabetes mellitus without complications: Secondary | ICD-10-CM | POA: Diagnosis not present

## 2016-08-13 NOTE — Progress Notes (Signed)
Subjective:    Patient ID: Justin Stone, male    DOB: 09/13/1960, 56 y.o.   MRN: [id number]  HPI Prosthetic check, right BKA prosthesis with silicone liner, wears 4 ply sock and a silicone suspension.  Low back is still doing okay after radiofrequency procedure. His developing left-sided pain sooner than right side. During the morning, pain is mainly on the left side, but as the day progresses. He has pain right side and left side.  Pain Inventory Average Pain 7 Pain Right Now 8 My pain is constant, sharp, burning, stabbing, tingling and aching  In the last 24 hours, has pain interfered with the following? General activity 7 Relation with others 7 Enjoyment of life 5 What TIME of day is your pain at its worst? Morning,evening, night Sleep (in general) Fair  Pain is worse with: walking, bending, sitting and standing Pain improves with: medication Relief from Meds: 7  Mobility use a cane do you drive?  no  Function disabled: date disabled n/a I need assistance with the following:  household duties  Neuro/Psych numbness trouble walking  Prior Studies Any changes since last visit?  no  Physicians involved in your care Any changes since last visit?  no   Family History  Problem Relation Age of Onset  . Diabetes Maternal Uncle   . Diabetes Maternal Grandmother   . Colon cancer Neg Hx   . Colon polyps Neg Hx   . Rectal cancer Neg Hx   . Stomach cancer Neg Hx   . Esophageal cancer Neg Hx    Social History   Social History  . Marital status: Single    Spouse name: N/A  . Number of children: N/A  . Years of education: N/A   Social History Main Topics  . Smoking status: Former Smoker    Packs/day: 1.00    Years: 20.00    Types: Cigarettes    Quit date: 06/10/1986  . Smokeless tobacco: Never Used  . Alcohol use Yes     Comment: 1 quart every 3 days - 24 oz cans  . Drug use: No  . Sexual activity: Not Asked   Other Topics Concern  . None   Social  History Narrative  . None   Past Surgical History:  Procedure Laterality Date  . HERNIA REPAIR Left    LIH  . INSERTION OF MESH N/A 06/15/2016   Procedure: INSERTION OF MESH;  Surgeon: Gaynelle Adu, MD;  Location: [facility];  Service: General;  Laterality: N/A;  . right leg removal     for diabetes, wears prosthesis -Right Below knee since '15  . UMBILICAL HERNIA REPAIR N/A 06/15/2016   Procedure: LAPAROSCOPIC ASSISTED REPAIR OF  UMBILICAL HERNIA;  Surgeon: Gaynelle Adu, MD;  Location: [facility];  Service: General;  Laterality: N/A;   Past Medical History:  Diagnosis Date  . Allergy    eye allergies  . Anxiety   . Arthritis   . Diabetes mellitus without complication (HCC)    takes oral meds now only  . GERD (gastroesophageal reflux disease)    past hx > 2 yrs ago  . Hepatitis    "Hepatitis C"- tx. with Harvoni- now testing negative.  . Hyperlipidemia   . Hypertension   . Neuromuscular disorder (HCC)    neuropathy hands/ feet.  . Osteopenia   . Prosthesis adjustment    right leg- below knee(weighs 10 lbs.)   BP (!) 157/89   Pulse 96   Resp 14  SpO2 95%   Opioid Risk Score:   Fall Risk Score:  `1  Depression screen PHQ 2/9  [facility] 11/14/2015 10/14/2015 09/16/2015 08/26/2015 05/26/2015 05/06/2015  Decreased Interest 0 2 0 0 0 1  Down, Depressed, Hopeless 0 0 0 0 0 0  PHQ - 2 Score 0 2 0 0 0 1  Altered sleeping - 3 - - 1 1  Tired, decreased energy - 1 - - 0 0  Change in appetite - 0 - - 0 0  Feeling bad or failure about yourself  - 0 - - 0 0  Trouble concentrating - 0 - - 0 0  Moving slowly or fidgety/restless - 0 - - 0 0  Suicidal thoughts - 0 - - 0 0  PHQ-9 Score - 6 - - 1 2     Review of Systems  Constitutional: Negative.   HENT: Negative.   Eyes: Negative.   Respiratory: Negative.   Cardiovascular: Negative.   Gastrointestinal: Negative.   Endocrine: Negative.   Genitourinary: Negative.   Musculoskeletal: Negative.   Skin: Negative.     Allergic/Immunologic: Negative.   Neurological: Negative.   Hematological: Negative.   Psychiatric/Behavioral: Negative.   All other systems reviewed and are negative.      Objective:   Physical Exam  5/5 left hip flexion, knee extension, ankle dorsiflexion. Right BKA checked. Good fit. No evidence of toe drag on the left side. No evidence of knee instability on either side. No evidence of pistoning with the right. BKA prosthetic.      Assessment & Plan:  1. History of right below-knee amputation, approximately 2 years ago. His new prosthesis with new suspension system is working at her. Good prosthetic fit  2. Chronic low back pain, lumbar spondylosis. Has had good results with radiofrequency procedure, starting to wear off. We'll repeat right side next month and left side in March.

## 2016-08-13 NOTE — Patient Instructions (Signed)
Repeat right radiofrequency next month and left in March

## 2016-08-21 ENCOUNTER — Other Ambulatory Visit: Payer: Self-pay | Admitting: Physician Assistant

## 2016-08-21 NOTE — Telephone Encounter (Signed)
Will defer further refills of patient's medications to PCP  

## 2016-08-23 ENCOUNTER — Telehealth: Payer: Self-pay | Admitting: Family Medicine

## 2016-08-23 ENCOUNTER — Other Ambulatory Visit: Payer: Self-pay | Admitting: Emergency Medicine

## 2016-08-23 MED ORDER — TRAZODONE HCL 50 MG PO TABS: 25.0000 mg | ORAL_TABLET | Freq: Every evening | ORAL | 3 refills | Status: DC | PRN

## 2016-08-23 NOTE — Telephone Encounter (Signed)
Called patient to schedule awv. Left msg for patient to call office to schedule appt.  °

## 2016-08-23 NOTE — Telephone Encounter (Signed)
Received refill request for TRAZODONE 50MG  TABLETS. Last office visit 08/13/2016 and last refill 05/05/16. Is it ok to refill? Please advise.

## 2016-08-30 ENCOUNTER — Telehealth: Payer: Self-pay | Admitting: Family Medicine

## 2016-08-30 DIAGNOSIS — G4700 Insomnia, unspecified: Secondary | ICD-10-CM

## 2016-08-30 DIAGNOSIS — M545 Low back pain, unspecified: Secondary | ICD-10-CM

## 2016-08-30 DIAGNOSIS — G8929 Other chronic pain: Secondary | ICD-10-CM

## 2016-08-30 MED ORDER — MORPHINE SULFATE ER 15 MG PO TBCR: 15.0000 mg | EXTENDED_RELEASE_TABLET | Freq: Two times a day (BID) | ORAL | 0 refills | Status: DC

## 2016-08-30 MED ORDER — CARISOPRODOL 350 MG PO TABS: 350.0000 mg | ORAL_TABLET | Freq: Three times a day (TID) | ORAL | 0 refills | Status: DC

## 2016-08-30 MED ORDER — OXYCODONE HCL 10 MG PO TABS: 5.0000 mg | ORAL_TABLET | ORAL | 0 refills | Status: DC | PRN

## 2016-08-30 NOTE — Telephone Encounter (Addendum)
Relation to HQ:IONGpt:self Call back number:[phone] [id number] - [place], [place] - [address] [facility] [phone] (Phone) [phone] (Fax)     Reason for call:  Patient requesting refill for morphine (MS CONTIN) 15 MG 12 hr tablet and carisoprodol (SOMA) 350 MG tablet.   Patient requesting refill and MG increase in Oxycodone HCl 10 MG TABS due to increase in pain when it rains.    Patient requesting direction change for traZODone (DESYREL) 50 MG tablet instead of 1/2 tablet would like a whole tablet due to sleep pattern being irregular.

## 2016-08-30 NOTE — Telephone Encounter (Signed)
Reviewed NCCSR- he got 30 days of soma on 1/09 30 days of MSIR 1/08 30 days of oxycodone 10 on 1/08  Decline to increase dose of medications due to risk of respiratory depression Last UDS on 1/08; moderate risk   Called but no answer, number rings busy Will leave rx for him to pick up- please let him know when he calls back   Called back and did reach pt/  Explained that I am not going to increase his medication dose due to risk of harm.  He states that he is not sleeping well and wants to know if we can increase his trazodone Will refer to sleep med to look into his sleep issues

## 2016-09-17 ENCOUNTER — Ambulatory Visit: Payer: Medicare Other | Admitting: Physical Medicine & Rehabilitation

## 2016-09-19 ENCOUNTER — Other Ambulatory Visit: Payer: Self-pay | Admitting: Physician Assistant

## 2016-09-19 ENCOUNTER — Other Ambulatory Visit: Payer: Self-pay | Admitting: Family Medicine

## 2016-09-20 ENCOUNTER — Encounter: Payer: Self-pay | Admitting: Family Medicine

## 2016-09-20 ENCOUNTER — Telehealth: Payer: Self-pay | Admitting: Family Medicine

## 2016-09-20 DIAGNOSIS — Z1211 Encounter for screening for malignant neoplasm of colon: Secondary | ICD-10-CM | POA: Diagnosis not present

## 2016-09-20 DIAGNOSIS — G8929 Other chronic pain: Secondary | ICD-10-CM

## 2016-09-20 DIAGNOSIS — M545 Low back pain, unspecified: Secondary | ICD-10-CM

## 2016-09-20 DIAGNOSIS — Z1212 Encounter for screening for malignant neoplasm of rectum: Secondary | ICD-10-CM | POA: Diagnosis not present

## 2016-09-20 NOTE — Telephone Encounter (Signed)
Caller name: Relationship to patient:Self Can be reached: [phone]  Pharmacy:  Reason for call: Patient called stating he needs to speak with provider about his medications. Plse adv

## 2016-09-21 NOTE — Telephone Encounter (Signed)
Called pt back regarding his medication. Pt states that he usually request his meds ahead of time. He called to make sure we will have his Carisoprodol (SOMA) 350 MG, morphine (MS Contin) 15 MG, and Oxycodone HCI 10 MG printed and ready for pick up on March 5th, 2018. Please advise.

## 2016-09-22 MED ORDER — MORPHINE SULFATE ER 15 MG PO TBCR: 15.0000 mg | EXTENDED_RELEASE_TABLET | Freq: Two times a day (BID) | ORAL | 0 refills | Status: DC

## 2016-09-22 MED ORDER — CARISOPRODOL 350 MG PO TABS: 350.0000 mg | ORAL_TABLET | Freq: Three times a day (TID) | ORAL | 0 refills | Status: DC

## 2016-09-22 MED ORDER — OXYCODONE HCL 10 MG PO TABS: 5.0000 mg | ORAL_TABLET | ORAL | 0 refills | Status: DC | PRN

## 2016-09-22 NOTE — Addendum Note (Signed)
Addended by: Abbe AmsterdamOPLAND, Adem Costlow C on: 09/22/2016 04:24 PM   Modules accepted: Orders

## 2016-09-22 NOTE — Telephone Encounter (Addendum)
Last OV 08/02/16  Reviewed NCCSR:   Indication for chronic opioid: low back pain, history of BKA with phantom pain Medication and dose: oxycodone 10, soma 350, MS contin # pills per month: soma 90 Oxycodone 70 MSIR 60 Last UDS date: 08/02/16 Pain contract signed (Y/N): 08/02/16 Date narcotic database last reviewed (include red flags): today  Last fills on 08/31/16- will provide refills for him  Called pt and let him know-

## 2016-09-24 LAB — COLOGUARD

## 2016-09-27 ENCOUNTER — Other Ambulatory Visit: Payer: Self-pay | Admitting: Physician Assistant

## 2016-09-30 ENCOUNTER — Ambulatory Visit: Payer: Medicare Other | Admitting: Family Medicine

## 2016-10-04 ENCOUNTER — Ambulatory Visit (HOSPITAL_BASED_OUTPATIENT_CLINIC_OR_DEPARTMENT_OTHER): Payer: Medicare Other | Admitting: Physical Medicine & Rehabilitation

## 2016-10-04 ENCOUNTER — Encounter: Payer: Medicare Other | Attending: Physical Medicine & Rehabilitation

## 2016-10-04 ENCOUNTER — Ambulatory Visit: Payer: Medicare Other | Admitting: Physical Medicine & Rehabilitation

## 2016-10-04 ENCOUNTER — Encounter: Payer: Self-pay | Admitting: Physical Medicine & Rehabilitation

## 2016-10-04 DIAGNOSIS — I10 Essential (primary) hypertension: Secondary | ICD-10-CM | POA: Diagnosis not present

## 2016-10-04 DIAGNOSIS — M47816 Spondylosis without myelopathy or radiculopathy, lumbar region: Secondary | ICD-10-CM | POA: Diagnosis not present

## 2016-10-04 DIAGNOSIS — E119 Type 2 diabetes mellitus without complications: Secondary | ICD-10-CM | POA: Insufficient documentation

## 2016-10-04 NOTE — Progress Notes (Signed)
  PROCEDURE RECORD [facility]   Name: Rolla PlateSheldon C Peragine DOB:09/07/1960 MRN: [id number]  Date:10/04/2016  Physician: Claudette LawsAndrew Kirsteins, MD    Nurse/CMA: Mardi Cannady, CMA  Allergies: No Known Allergies  Consent Signed: Yes.    Is patient diabetic? Yes.    CBG today? 167  Pregnant: No. LMP: No LMP for male patient. (age 55-55)  Anticoagulants: no Anti-inflammatory: no Antibiotics: no  Procedure: radiofrequency neurotomy  Position: Prone Start Time:  2:50pm End Time: 3:15pm  Fluoro Time: 40  RN/CMA Janelly Switalski, CMA Ronnetta Currington, CMA    Time 2:20pm 3:26pm    BP 155/93 160/93    Pulse 103 100    Respirations 14 14    O2 Sat 94 95    S/S 6 6    Pain Level 7/10 7/10     D/C home with transportation, patient A & O X 3, D/C instructions reviewed, and sits independently.

## 2016-10-04 NOTE — Procedures (Signed)
RightL5 dorsal ramus., Right L4 and Right L3 medial branch radio frequency neurotomy under fluoroscopic guidance   Indication: Low back pain due to lumbar spondylosis which has been relieved on 2 occasions by greater than 50% by lumbar medial branch blocks at corresponding levels.  Informed consent was obtained after describing risks and benefits of the procedure with the patient, this includes bleeding, bruising, infection, paralysis and medication side effects. The patient wishes to proceed and has given written consent. The patient was placed in a prone position. The lumbar and sacral area was marked and prepped with Betadine. A 25-gauge 1-1/2 inch needle was inserted into the skin and subcutaneous tissue at 3 sites in one ML of 1% lidocaine was injected into each site. Then a 18-gauge 15 cm radio frequency needle with a 1 cm curved active tip was inserted targeting the Right S1 SAP/sacral ala junction. Bone contact was made and confirmed with lateral imaging.  motor stimulation at 2 Hz confirm proper needle location followed by injection of 1ml 2% MPF lidocaine. Then the Right L5 SAP/transverse process junction was targeted. Bone contact was made and confirmed with lateral imaging.  motor stimulation at 2 Hz confirm proper needle location followed by injection of 1ml 2% MPF lidocaine. Then the Right L4 SAP/transverse process junction was targeted. Bone contact was made and confirmed with lateral imaging. motor stimulation at 2 Hz confirm proper needle location followed by injection of 1ml 2% MPF lidocaine. Radio frequency lesion being at 80C for 90 seconds was performed. Needles were removed. Post procedure instructions and vital signs were performed. Patient tolerated procedure well. Followup appointment was given. 

## 2016-10-04 NOTE — Patient Instructions (Addendum)
You had a radio frequency procedure today This was done to alleviate joint pain in your lumbar area We injected a combination of dexamethasone which is a steroid as well as lidocaine which is a local anesthetic. Dexamethasone made increased blood sugars you are diabetic You may experience soreness at the injection sites. You may also experienced some irritation of the nerves that were heated I'm recommending ice for 30 minutes every 2 hours as needed for the next 24-48 hours   

## 2016-10-05 ENCOUNTER — Telehealth: Payer: Self-pay | Admitting: Physical Medicine & Rehabilitation

## 2016-10-05 NOTE — Telephone Encounter (Signed)
Phoned home left voicemail to reschedule his appt. For next month with AK

## 2016-10-06 ENCOUNTER — Encounter: Payer: Self-pay | Admitting: Family Medicine

## 2016-10-07 ENCOUNTER — Ambulatory Visit (INDEPENDENT_AMBULATORY_CARE_PROVIDER_SITE_OTHER): Payer: Medicare Other | Admitting: Family Medicine

## 2016-10-07 ENCOUNTER — Encounter: Payer: Self-pay | Admitting: Family Medicine

## 2016-10-07 VITALS — BP 160/82 | HR 108 | Temp 98.2°F | Ht 67.0 in | Wt 251.8 lb

## 2016-10-07 DIAGNOSIS — K7030 Alcoholic cirrhosis of liver without ascites: Secondary | ICD-10-CM | POA: Diagnosis not present

## 2016-10-07 DIAGNOSIS — G4700 Insomnia, unspecified: Secondary | ICD-10-CM

## 2016-10-07 DIAGNOSIS — M545 Low back pain, unspecified: Secondary | ICD-10-CM

## 2016-10-07 DIAGNOSIS — Z0000 Encounter for general adult medical examination without abnormal findings: Secondary | ICD-10-CM

## 2016-10-07 DIAGNOSIS — E119 Type 2 diabetes mellitus without complications: Secondary | ICD-10-CM

## 2016-10-07 DIAGNOSIS — Z89511 Acquired absence of right leg below knee: Secondary | ICD-10-CM

## 2016-10-07 DIAGNOSIS — Z130 Encounter for screening for diseases of the blood and blood-forming organs and certain disorders involving the immune mechanism: Secondary | ICD-10-CM | POA: Diagnosis not present

## 2016-10-07 DIAGNOSIS — Z125 Encounter for screening for malignant neoplasm of prostate: Secondary | ICD-10-CM

## 2016-10-07 DIAGNOSIS — I10 Essential (primary) hypertension: Secondary | ICD-10-CM | POA: Diagnosis not present

## 2016-10-07 DIAGNOSIS — G8929 Other chronic pain: Secondary | ICD-10-CM | POA: Diagnosis not present

## 2016-10-07 MED ORDER — GABAPENTIN 300 MG PO CAPS: ORAL_CAPSULE | ORAL | 5 refills | Status: DC

## 2016-10-07 MED ORDER — VENLAFAXINE HCL 75 MG PO TABS: ORAL_TABLET | ORAL | 3 refills | Status: DC

## 2016-10-07 MED ORDER — LISINOPRIL 20 MG PO TABS: 20.0000 mg | ORAL_TABLET | Freq: Every day | ORAL | 3 refills | Status: DC

## 2016-10-07 MED ORDER — TRAZODONE HCL 50 MG PO TABS: 25.0000 mg | ORAL_TABLET | Freq: Every evening | ORAL | 3 refills | Status: DC | PRN

## 2016-10-07 NOTE — Progress Notes (Signed)
Pre visit review using our clinic review tool, if applicable. No additional management support is needed unless otherwise documented below in the visit note. 

## 2016-10-07 NOTE — Progress Notes (Addendum)
[facility] at [facility] [employer] [place], [place],  [address] [phone]  Date:  10/07/2016   Name:  Justin Stone   DOB:  21-Jan-1961   MRN:  [id number]  PCP:  Lamar Blinks, MD    Chief Complaint: Annual Exam (Pt here for CPE. )   History of Present Illness:  Justin Stone is a 56 y.o. very pleasant male patient who presents with the following:  Here today for a CPE History of DM, HTN, phantom pain following lower limb amputation,obeseity, hyperlipidemia, cirrhosis of liver  He is treated with norvasc, lasix, lisinopril for his BP Metformin for DM- well controlled  zocor Soma, MS contin, oxycodone for his chronic pain Trazodone for sleep effexor   Last labs in 11/17 Last PSA 2016  He is not quite fasting but did not eat much so far today His blood sugar has been a bit higher than normal lately- under 175 however.  Due for an A1c  Lab Results  Component Value Date   HGBA1C 5.7 (H) 05/19/2016   BP Readings from Last 3 Encounters:  10/07/16 (!) 160/82  08/13/16 (!) 157/89  08/02/16 132/78   cologuard testing was negative from last month  His BP is too high on his current dose of BP medication as below  BP Readings from Last 3 Encounters:  10/07/16 (!) 160/82  08/13/16 (!) 157/89  08/02/16 132/78   Will increase lisinopril to 20 mg He had repair of his umbilical hernia and is pleased with his operative result   Patient Active Problem List   Diagnosis Date Noted  . Insomnia 05/13/2016  . Umbilical hernia without obstruction and without gangrene 05/13/2016  . Spondylosis of lumbar region without myelopathy or radiculopathy 07/22/2015  . Obesity 06/29/2015  . Phantom pain following amputation of lower limb ([place]) 06/06/2015  . Type II diabetes mellitus, well controlled ([place]) 03/26/2015  . S/P BKA (below knee amputation) ([place]) 03/26/2015  . Alcoholic cirrhosis of liver without ascites ([place]) 02/22/2015   . Chronic hepatitis C without hepatic coma ([place]) 02/22/2015  . Chronic low back pain 12/29/2014  . Essential hypertension, benign 12/29/2014  . Hyperlipidemia LDL goal <100 12/29/2014    Past Medical History:  Diagnosis Date  . Allergy    eye allergies  . Anxiety   . Arthritis   . Diabetes mellitus without complication ([place])    takes oral meds now only  . GERD (gastroesophageal reflux disease)    past hx > 2 yrs ago  . Hepatitis    "Hepatitis C"- tx. with Harvoni- now testing negative.  . Hyperlipidemia   . Hypertension   . Neuromuscular disorder (HCC)    neuropathy hands/ feet.  . Osteopenia   . Prosthesis adjustment    right leg- below knee(weighs 10 lbs.)    Past Surgical History:  Procedure Laterality Date  . HERNIA REPAIR Left    LIH  . INSERTION OF MESH N/A 06/15/2016   Procedure: INSERTION OF MESH;  Surgeon: Greer Pickerel, MD;  Location: [facility];  Service: General;  Laterality: N/A;  . right leg removal     for diabetes, wears prosthesis -Right Below knee since '15  . UMBILICAL HERNIA REPAIR N/A 06/15/2016   Procedure: LAPAROSCOPIC ASSISTED REPAIR OF  UMBILICAL HERNIA;  Surgeon: Greer Pickerel, MD;  Location: [facility];  Service: General;  Laterality: N/A;    Social History  Substance Use Topics  . Smoking status: Former Smoker  Packs/day: 1.00    Years: 20.00    Types: Cigarettes    Quit date: 06/10/1986  . Smokeless tobacco: Never Used  . Alcohol use Yes     Comment: 1 quart every 3 days - 24 oz cans    Family History  Problem Relation Age of Onset  . Diabetes Maternal Uncle   . Diabetes Maternal Grandmother   . Colon cancer Neg Hx   . Colon polyps Neg Hx   . Rectal cancer Neg Hx   . Stomach cancer Neg Hx   . Esophageal cancer Neg Hx     No Known Allergies  Medication list has been reviewed and updated.  Current Outpatient Prescriptions on File Prior to Visit  Medication Sig Dispense Refill  . amLODipine (NORVASC) 10 MG tablet TAKE 1 TABLET(10  MG) BY MOUTH DAILY 90 tablet 3  . aspirin EC 81 MG tablet Take 81 mg by mouth daily.    Marland Kitchen azelastine (OPTIVAR) 0.05 % ophthalmic solution Place 1 drop into both eyes 2 (two) times daily. 6 mL 12  . bisacodyl (DULCOLAX) 5 MG EC tablet Take 5 mg by mouth daily as needed for moderate constipation.    . carisoprodol (SOMA) 350 MG tablet Take 1 tablet (350 mg total) by mouth 3 (three) times daily. To fill 30 days after rx 90 tablet 0  . furosemide (LASIX) 40 MG tablet TAKE 1 TABLET(40 MG) BY MOUTH DAILY 90 tablet 3  . gabapentin (NEURONTIN) 300 MG capsule Take 3 in the morning, 2 at noon and 3 in the evening; 8 total daily 240 capsule 6  . glucose blood (ONETOUCH VERIO) test strip Dx: E11.9 100 each 5  . lisinopril (PRINIVIL,ZESTRIL) 10 MG tablet TAKE 1 TABLET BY MOUTH DAILY 90 tablet 3  . metFORMIN (GLUCOPHAGE) 500 MG tablet Take 500 mg by mouth 2 (two) times daily.    . metFORMIN (GLUCOPHAGE) 500 MG tablet TAKE 2 TABLETS BY MOUTH IN THE MORNING AND 1 TABLET IN THE EVENING 270 tablet 2  . morphine (MS CONTIN) 15 MG 12 hr tablet Take 1 tablet (15 mg total) by mouth every 12 (twelve) hours. To fill 30 days after rx 60 tablet 0  . NARCAN 4 MG/0.1ML LIQD nasal spray kit CALL 911 AND U 1 SPR IN 1 NOS . REPEAT AFTRER 3 MIN IF NO OR MINIMAL RESPONSE  0  . ONETOUCH DELICA LANCETS 59D MISC Use to check blood sugar twice daily. 100 each 5  . Oxycodone HCl 10 MG TABS Take 0.5-1 tablets (5-10 mg total) by mouth every 4 (four) hours as needed. This is a 30 day supply.  To fill 30 days after rx 70 tablet 0  . Polyethyl Glycol-Propyl Glycol (SYSTANE OP) Apply to eye.    . polyethylene glycol powder (GLYCOLAX/MIRALAX) powder Take 1 Container by mouth once.    . sildenafil (REVATIO) 20 MG tablet Take 1 tablet by mouth as directed 30 minutes before intercourse 50 tablet 0  . simvastatin (ZOCOR) 10 MG tablet TAKE 1 TABLET BY MOUTH AT BEDTIME 90 tablet 3  . traZODone (DESYREL) 50 MG tablet Take 0.5 tablets (25 mg total)  by mouth at bedtime as needed for sleep. 30 tablet 3  . venlafaxine (EFFEXOR) 75 MG tablet TAKE 1 TABLET(75 MG) BY MOUTH TWICE DAILY 180 tablet 1   No current facility-administered medications on file prior to visit.     Review of Systems:  As per HPI- otherwise negative.  Denies any GU concerns  Physical Examination: Vitals:   10/07/16 1426  BP: (!) 160/82  Pulse: (!) 108  Temp: 98.2 F (36.8 C)   Vitals:   10/07/16 1426  Weight: 251 lb 12.8 oz (114.2 kg)  Height: _0  (1.702 m)   Body mass index is 39.44 kg/m. Ideal Body Weight: Weight in (lb) to have BMI = 25: 159.3  GEN: WDWN, NAD, Non-toxic, A & O x 3, obese, otherwise looks well HEENT: Atraumatic, Normocephalic. Neck supple. No masses, No LAD.  Bilateral TM wnl, oropharynx normal.  PEERL,EOMI.   Ears and Nose: No external deformity. CV: RRR- rate 90- 100 BPM, No M/G/R. No JVD. No thrill. No extra heart sounds. PULM: CTA B, no wheezes, crackles, rhonchi. No retractions. No resp. distress. No accessory muscle use. ABD: S, NT, ND, +BS. No rebound. No HSM. EXTR: No c/c/e NEURO Normal gait.   Right leg amputation PSYCH: Normally interactive. Conversant. Not depressed or anxious appearing.  Calm demeanor.   Pulse Readings from Last 3 Encounters:  10/07/16 (!) 108  08/13/16 96  08/02/16 (!) 102    Assessment and Plan: Physical exam  Chronic low back pain without sciatica, unspecified back pain laterality - Plan: venlafaxine (EFFEXOR) 75 MG tablet, gabapentin (NEURONTIN) 300 MG capsule  Insomnia, unspecified type - Plan: traZODone (DESYREL) 50 MG tablet  Type II diabetes mellitus, well controlled ([place]) - Plan: Hemoglobin A1c, Comprehensive metabolic panel  Status post below knee amputation of right lower extremity (HCC)  Alcoholic cirrhosis of liver without ascites ([place]) - Plan: Comprehensive metabolic panel  Prostate cancer screening - Plan: PSA  Essential hypertension, benign - Plan: lisinopril  (PRINIVIL,ZESTRIL) 20 MG tablet  Screening for deficiency anemia - Plan: CBC  Here today for a CPE Refilled mediations- he has been taking gabapentin 900 TID so changed rx to reflect this Increased lisinopril to 20 mg also Will plan further follow- up pending labs.  Signed Lamar Blinks, MD  Received his labs and gave him a call 3/18  Results for orders placed or performed in visit on 10/07/16  Hemoglobin A1c  Result Value Ref Range   Hgb A1c MFr Bld 4.8 4.6 - 6.5 %  Comprehensive metabolic panel  Result Value Ref Range   Sodium 130 (L) 135 - 145 mEq/L   Potassium 4.7 3.5 - 5.1 mEq/L   Chloride 101 96 - 112 mEq/L   CO2 21 19 - 32 mEq/L   Glucose, Bld 137 (H) 70 - 99 mg/dL   BUN 15 6 - 23 mg/dL   Creatinine, Ser 1.09 0.40 - 1.50 mg/dL   Total Bilirubin 0.4 0.2 - 1.2 mg/dL   Alkaline Phosphatase 106 39 - 117 U/L   AST 54 (H) 0 - 37 U/L   ALT 81 (H) 0 - 53 U/L   Total Protein 8.2 6.0 - 8.3 g/dL   Albumin 4.1 3.5 - 5.2 g/dL   Calcium 9.8 8.4 - 10.5 mg/dL   GFR 90.45 >60.00 mL/min  CBC  Result Value Ref Range   WBC 7.3 4.0 - 10.5 K/uL   RBC 3.34 (L) 4.22 - 5.81 Mil/uL   Platelets 147.0 (L) 150.0 - 400.0 K/uL   Hemoglobin 10.3 (L) 13.0 - 17.0 g/dL   HCT 31.3 (L) 39.0 - 52.0 %   MCV 93.5 78.0 - 100.0 fl   MCHC 32.9 30.0 - 36.0 g/dL   RDW 14.6 11.5 - 15.5 %  PSA  Result Value Ref Range   PSA 3.48 0.10 - 4.00 ng/mL   He  just had a negative cologuard so we doubt his mild anemia is due to GI blood losses-  A1c looks great His PSA has gone up some since last check 2 years ago- need to repeat in a few months and consider Urology referral if not trending back down LFTs are up- encouraged him to cut down on alcohol Mild hyponatremia- he has not sx.  He does take lasix and drinks a fair amount of water as he thought it was good for his health.  encouraged him to drink only when thirsty and he will do so   We will see him back in 3 months- he prefers a later morning appt.   Will set up appt for him

## 2016-10-07 NOTE — Patient Instructions (Addendum)
I will be in touch with your labs asap I increased your neurontin rx to 3 pills 3x a day We also increased your lisinopril to 20 mg a day as your BP is too high I will be in touch with your labs asap  Take care and let me know when you need more of your pain medications

## 2016-10-08 LAB — COMPREHENSIVE METABOLIC PANEL
ALT: 81 U/L — AB (ref 0–53)
AST: 54 U/L — ABNORMAL HIGH (ref 0–37)
Albumin: 4.1 g/dL (ref 3.5–5.2)
Alkaline Phosphatase: 106 U/L (ref 39–117)
BUN: 15 mg/dL (ref 6–23)
CO2: 21 meq/L (ref 19–32)
Calcium: 9.8 mg/dL (ref 8.4–10.5)
Chloride: 101 mEq/L (ref 96–112)
Creatinine, Ser: 1.09 mg/dL (ref 0.40–1.50)
GFR: 90.45 mL/min (ref >60.00)
GLUCOSE: 137 mg/dL — AB (ref 70–99)
POTASSIUM: 4.7 meq/L (ref 3.5–5.1)
SODIUM: 130 meq/L — AB (ref 135–145)
Total Bilirubin: 0.4 mg/dL (ref 0.2–1.2)
Total Protein: 8.2 g/dL (ref 6.0–8.3)

## 2016-10-08 LAB — CBC
HCT: 31.3 % — ABNORMAL LOW (ref 39.0–52.0)
HEMOGLOBIN: 10.3 g/dL — AB (ref 13.0–17.0)
MCHC: 32.9 g/dL (ref 30.0–36.0)
MCV: 93.5 fl (ref 78.0–100.0)
PLATELETS: 147 10*3/uL — AB (ref 150.0–400.0)
RBC: 3.34 Mil/uL — ABNORMAL LOW (ref 4.22–5.81)
RDW: 14.6 % (ref 11.5–15.5)
WBC: 7.3 10*3/uL (ref 4.0–10.5)

## 2016-10-08 LAB — PSA: PSA: 3.48 ng/mL (ref 0.10–4.00)

## 2016-10-08 LAB — HEMOGLOBIN A1C: HEMOGLOBIN A1C: 4.8 % (ref 4.6–6.5)

## 2016-10-10 ENCOUNTER — Encounter: Payer: Self-pay | Admitting: Family Medicine

## 2016-10-11 ENCOUNTER — Telehealth: Payer: Self-pay

## 2016-10-11 NOTE — Telephone Encounter (Signed)
Called patient to schedule 3 monthappointment. Left message for return call.

## 2016-10-11 NOTE — Telephone Encounter (Signed)
-----   Message from Pearline CablesJessica C Copland, MD sent at 10/10/2016  6:38 PM EDT ----- Hi T- can you please set up an appt for this pt for about 3 months from now?  He prefers a later morning appt Thank you! JC

## 2016-10-12 ENCOUNTER — Institutional Professional Consult (permissible substitution): Payer: Medicare Other | Admitting: Neurology

## 2016-10-12 ENCOUNTER — Telehealth: Payer: Self-pay | Admitting: Family Medicine

## 2016-10-12 NOTE — Telephone Encounter (Signed)
°  Relation to NW:GNFApt:self Call back number:[phone]   Reason for call:  Patient [facility] like to discuss [facility] and would like PCP insight, please advise

## 2016-10-14 NOTE — Telephone Encounter (Signed)
Called him back- [facility] and completed Harvoni. He though his hepatitis was cleared .  He then got a letter about a liver transplant and was confused. Encouraged him to [facility] and [facility]- I suspect there may be some error.  He will do so

## 2016-10-18 ENCOUNTER — Other Ambulatory Visit: Payer: Self-pay | Admitting: Family Medicine

## 2016-10-18 NOTE — Telephone Encounter (Addendum)
Relation to ZO:XWRUpt:self Call back number:[phone]   Reason for call:  Patient requesting MD prescribe ALPRAZolam Prudy Feeler(XANAX) tablet 1 mg,, patient states he doesn't want to conduct sleep apnea and would like to discuss,please advise if appointment is necessary

## 2016-10-19 NOTE — Telephone Encounter (Signed)
Called him back- at this time I think he is on enough sedating medications and I do not feel comfortable starting a benzo.  Offered to have him see a Veterinary surgeoncounselor but he declines.  Advised it is up to him if he wants to be seen for sleep apnea evaluation or not.  He states understanding

## 2016-11-07 ENCOUNTER — Other Ambulatory Visit: Payer: Self-pay | Admitting: Physician Assistant

## 2016-11-11 ENCOUNTER — Ambulatory Visit (HOSPITAL_BASED_OUTPATIENT_CLINIC_OR_DEPARTMENT_OTHER): Payer: Medicare Other | Admitting: Physical Medicine & Rehabilitation

## 2016-11-11 ENCOUNTER — Encounter: Payer: Medicare Other | Attending: Physical Medicine & Rehabilitation

## 2016-11-11 ENCOUNTER — Encounter: Payer: Self-pay | Admitting: Physical Medicine & Rehabilitation

## 2016-11-11 DIAGNOSIS — E119 Type 2 diabetes mellitus without complications: Secondary | ICD-10-CM | POA: Insufficient documentation

## 2016-11-11 DIAGNOSIS — M47816 Spondylosis without myelopathy or radiculopathy, lumbar region: Secondary | ICD-10-CM

## 2016-11-11 DIAGNOSIS — I10 Essential (primary) hypertension: Secondary | ICD-10-CM | POA: Diagnosis not present

## 2016-11-11 NOTE — Progress Notes (Signed)
  PROCEDURE RECORD [facility]   Name: Justin Stone DOB:Mar 29, 1961 MRN: [id number]  Date:11/11/2016  Physician: Claudette Laws, MD    Nurse/CMA: Hyrum Shaneyfelt, CMA  Allergies: No Known Allergies  Consent Signed: Yes.    Is patient diabetic? Yes.    CBG today? 146  Pregnant: No. LMP: No LMP for male patient. (age 55-55)  Anticoagulants: no Anti-inflammatory: no Antibiotics: no  Procedure: left radiofrequency neurotomy Position: Prone Start Time: 1:36pm  End Time: 2:03pm  Fluoro Time: 1:40  RN/CMA Julene Rahn, CMA Sondra Blixt, CMA    Time 1:25pm 2:15pm    BP 128/77 154/82    Pulse 101 111    Respirations 14 14    O2 Sat 93 92    S/S 6 6    Pain Level 7/10 5/10     D/C home with medical transportation, patient A & O X 3, D/C instructions reviewed, and sits independently.

## 2016-11-11 NOTE — Patient Instructions (Signed)

## 2016-11-11 NOTE — Progress Notes (Signed)
Left L5 dorsal ramus., left L4 and left L3 medial branch radio frequency neurotomy under fluoroscopic guidance  Indication: Low back pain due to lumbar spondylosis which has been relieved on 2 occasions by greater than 50% by lumbar medial branch blocks at corresponding levels.  Informed consent was obtained after describing risks and benefits of the procedure with the patient, this includes bleeding, bruising, infection, paralysis and medication side effects. The patient wishes to proceed and has given written consent. The patient was placed in a prone position. The lumbar and sacral area was marked and prepped with Betadine. A 25-gauge 1-1/2 inch needle was inserted into the skin and subcutaneous tissue at 3 sites in one ML of 1% lidocaine was injected into each site. Then a 18-gauge 15 cm radio frequency needle with a 1 cm curved active tip was inserted targeting the left S1 SAP/sacral ala junction. Bone contact was made and confirmed with lateral imaging.  motor stimulation at 2 Hz confirm proper needle location followed by injection of one ML of 1% MPF lidocaine. Then the left L5 SAP/transverse process junction was targeted. Bone contact was made and confirmed with lateral imaging motor stimulation at 2 Hz confirm proper needle location followed by injection of one ML of the solution containing one ML of  1% MPF lidocaine. Then the left L4 SAP/transverse process junction was targeted. Bone contact was made and confirmed with lateral imaging. motor stimulation at 2 Hz confirm proper needle location followed by injection of one ML of the solution containing one ML of1% MPF lidocaine. Radio frequency lesion  at 80C for 90 seconds was performed. Needles were removed. Post procedure instructions and vital signs were performed. Patient tolerated procedure well. Followup appointment was given.  

## 2016-11-16 ENCOUNTER — Ambulatory Visit: Payer: Medicare Other | Admitting: Physical Medicine & Rehabilitation

## 2016-11-16 DIAGNOSIS — Z89511 Acquired absence of right leg below knee: Secondary | ICD-10-CM | POA: Diagnosis not present

## 2016-11-16 DIAGNOSIS — R112 Nausea with vomiting, unspecified: Secondary | ICD-10-CM | POA: Diagnosis not present

## 2016-11-16 DIAGNOSIS — I10 Essential (primary) hypertension: Secondary | ICD-10-CM | POA: Diagnosis not present

## 2016-11-16 DIAGNOSIS — R1111 Vomiting without nausea: Secondary | ICD-10-CM | POA: Diagnosis not present

## 2016-11-16 DIAGNOSIS — R1110 Vomiting, unspecified: Secondary | ICD-10-CM | POA: Diagnosis not present

## 2016-11-16 DIAGNOSIS — E119 Type 2 diabetes mellitus without complications: Secondary | ICD-10-CM | POA: Diagnosis not present

## 2016-11-16 DIAGNOSIS — E871 Hypo-osmolality and hyponatremia: Secondary | ICD-10-CM | POA: Diagnosis not present

## 2016-11-16 DIAGNOSIS — M7989 Other specified soft tissue disorders: Secondary | ICD-10-CM | POA: Diagnosis not present

## 2016-11-16 DIAGNOSIS — E860 Dehydration: Secondary | ICD-10-CM | POA: Diagnosis not present

## 2016-11-16 DIAGNOSIS — R600 Localized edema: Secondary | ICD-10-CM | POA: Diagnosis not present

## 2016-11-16 DIAGNOSIS — N200 Calculus of kidney: Secondary | ICD-10-CM | POA: Diagnosis not present

## 2016-11-16 DIAGNOSIS — R140 Abdominal distension (gaseous): Secondary | ICD-10-CM | POA: Diagnosis not present

## 2016-11-16 DIAGNOSIS — K7460 Unspecified cirrhosis of liver: Secondary | ICD-10-CM | POA: Diagnosis not present

## 2016-11-16 DIAGNOSIS — R197 Diarrhea, unspecified: Secondary | ICD-10-CM | POA: Diagnosis not present

## 2016-11-16 DIAGNOSIS — K8020 Calculus of gallbladder without cholecystitis without obstruction: Secondary | ICD-10-CM | POA: Diagnosis not present

## 2016-11-16 DIAGNOSIS — M79605 Pain in left leg: Secondary | ICD-10-CM | POA: Diagnosis not present

## 2016-11-16 DIAGNOSIS — R1310 Dysphagia, unspecified: Secondary | ICD-10-CM | POA: Diagnosis not present

## 2016-11-17 ENCOUNTER — Other Ambulatory Visit: Payer: Self-pay | Admitting: Family Medicine

## 2016-11-17 DIAGNOSIS — M545 Low back pain, unspecified: Secondary | ICD-10-CM

## 2016-11-17 DIAGNOSIS — G8929 Other chronic pain: Secondary | ICD-10-CM

## 2016-11-17 MED ORDER — OXYCODONE HCL 10 MG PO TABS: 5.0000 mg | ORAL_TABLET | ORAL | 0 refills | Status: DC | PRN

## 2016-11-17 MED ORDER — CARISOPRODOL 350 MG PO TABS: 350.0000 mg | ORAL_TABLET | Freq: Three times a day (TID) | ORAL | 1 refills | Status: DC

## 2016-11-17 MED ORDER — MORPHINE SULFATE ER 15 MG PO TBCR: 15.0000 mg | EXTENDED_RELEASE_TABLET | Freq: Two times a day (BID) | ORAL | 0 refills | Status: DC

## 2016-11-17 NOTE — Telephone Encounter (Signed)
Last seen by myself in March of this year  Signed contract and did UDS on 1/18- due for UDS in July  NCCCSR:  Filled soma on 4/21 (90, no refills) Filled MSIR on 4/3 (60) Filled oxycodone 10 on 4/3 (70) Will refill for him today- will give 2 rx

## 2016-11-17 NOTE — Telephone Encounter (Signed)
Caller name: Relationship to patient: Self Can be reached: [phone]  Pharmacy:  Reason for call: Refill carisoprodol (SOMA) 350 MG tablet [[id number] Oxycodone HCl 10 MG TABS [[id number] morphine (MS CONTIN) 15 MG 12 hr tablet

## 2016-12-02 ENCOUNTER — Telehealth: Payer: Self-pay | Admitting: Family Medicine

## 2016-12-02 NOTE — Telephone Encounter (Signed)
Caller name: Relationship to patient: Self Can be reached: [phone]  Pharmacy:  Reason for call: Request Rx for Dexcom diabetic meter.

## 2016-12-03 MED ORDER — BLOOD GLUCOSE METER KIT: PACK | 0 refills | Status: DC

## 2016-12-03 NOTE — Telephone Encounter (Signed)
Script for Dexcom diabetic meter sent to pharmacy per pt request.

## 2016-12-06 ENCOUNTER — Ambulatory Visit: Payer: Medicare Other | Admitting: Physical Medicine & Rehabilitation

## 2016-12-06 ENCOUNTER — Ambulatory Visit: Payer: Medicare Other

## 2016-12-23 ENCOUNTER — Other Ambulatory Visit: Payer: Self-pay | Admitting: Family Medicine

## 2016-12-31 ENCOUNTER — Other Ambulatory Visit: Payer: Self-pay | Admitting: Family Medicine

## 2016-12-31 DIAGNOSIS — G8929 Other chronic pain: Secondary | ICD-10-CM

## 2016-12-31 DIAGNOSIS — M545 Low back pain, unspecified: Secondary | ICD-10-CM

## 2016-12-31 MED ORDER — GABAPENTIN 300 MG PO CAPS: ORAL_CAPSULE | ORAL | 2 refills | Status: DC

## 2017-01-10 ENCOUNTER — Telehealth: Payer: Self-pay | Admitting: Family Medicine

## 2017-01-10 DIAGNOSIS — I10 Essential (primary) hypertension: Secondary | ICD-10-CM

## 2017-01-10 NOTE — Telephone Encounter (Signed)
Caller name:Kayhan Wetherby  Relationship to patient: Can be reached:[phone] Pharmacy:[facility] on [address]  Reason for call:Requesting refill on lisinopril 20mg , patient is completely out.

## 2017-01-11 MED ORDER — LISINOPRIL 20 MG PO TABS: 20.0000 mg | ORAL_TABLET | Freq: Every day | ORAL | 0 refills | Status: DC

## 2017-01-11 NOTE — Progress Notes (Addendum)
[facility] [address], [place], [place] [address] [phone]  Date:  01/13/2017   Name:  Justin Stone   DOB:  07/14/1961   MRN:  [id number]  PCP:  Darreld Mclean, MD    Chief Complaint: Follow-up (Pt here for f/u visit.)   History of Present Illness:  Justin Stone is a 56 y.o. very pleasant male patient who presents with the following:  Here today for a follow-up visit.  He is treated for chronic pain s/p right BKA for complications of diabetes   Indication for chronic opioid: phantom limb pain Medication and dose: soma 350 TID, oxycodone 10, MSER 15 BID # pills per month: soma 90, oxycodone 70, MSIR 60 Last UDS date: 1/18 Pain contract signed (Y/N): 1/18  Date narcotic database last reviewed (include red flags): 6/19, PDF of results printed for referance  Lab Results  Component Value Date   HGBA1C 4.8 10/07/2016   His DM has been under very good control His pain is about the same as usual He continues to have the phatom limb pain in his right leg We do need to check his sodium again today as he was a bit hyponatremic at last check  He has noted that his sleep habits are not good.  He is not sure if he snores.  He does tend to wake up easily during the night. He often feels just tired- and like he could take a nap during the day.  However he does not want to be tested for sleep apnea  He plans to move into a new home and will be living alone- he hopes that the cold and ability to control his thermostat will help him sleep better  Last rx soma 5/31, oxycodone 10 5/30, MSIR 5/30  Discussed incident with phone call earlier this week- Justin Stone apologized for his treatment of my staff and for his language, and seems embarrassed that he lost his temper.  He does want to continue to be seen here.  Will not dismiss him from the practice   Wt Readings from Last 3 Encounters:  01/13/17 254 lb 12.8 oz (115.6  kg)  10/07/16 251 lb 12.8 oz (114.2 kg)  08/02/16 243 lb (110.2 kg)    BP Readings from Last 3 Encounters:  01/13/17 (!) 158/82  10/07/16 (!) 160/82  08/13/16 (!) 157/89    Patient Active Problem List   Diagnosis Date Noted  . Insomnia 05/13/2016  . Umbilical hernia without obstruction and without gangrene 05/13/2016  . Spondylosis of lumbar region without myelopathy or radiculopathy 07/22/2015  . Obesity 06/29/2015  . Phantom pain following amputation of lower limb ([place]) 06/06/2015  . Type II diabetes mellitus, well controlled ([place]) 03/26/2015  . S/P BKA (below knee amputation) ([place]) 03/26/2015  . Alcoholic cirrhosis of liver without ascites ([place]) 02/22/2015  . Chronic hepatitis C without hepatic coma ([place]) 02/22/2015  . Chronic low back pain 12/29/2014  . Essential hypertension, benign 12/29/2014  . Hyperlipidemia LDL goal <100 12/29/2014    Past Medical History:  Diagnosis Date  . Allergy    eye allergies  . Anxiety   . Arthritis   . Diabetes mellitus without complication ([place])    takes oral meds now only  . GERD (gastroesophageal reflux disease)    past hx > 2 yrs ago  . Hepatitis    "Hepatitis C"- tx. with Harvoni- now testing negative.  . Hyperlipidemia   .  Hypertension   . Neuromuscular disorder (HCC)    neuropathy hands/ feet.  . Osteopenia   . Prosthesis adjustment    right leg- below knee(weighs 10 lbs.)    Past Surgical History:  Procedure Laterality Date  . HERNIA REPAIR Left    LIH  . INSERTION OF MESH N/A 06/15/2016   Procedure: INSERTION OF MESH;  Surgeon: Greer Pickerel, MD;  Location: [facility];  Service: General;  Laterality: N/A;  . right leg removal     for diabetes, wears prosthesis -Right Below knee since '15  . UMBILICAL HERNIA REPAIR N/A 06/15/2016   Procedure: LAPAROSCOPIC ASSISTED REPAIR OF  UMBILICAL HERNIA;  Surgeon: Greer Pickerel, MD;  Location: [facility];  Service: General;  Laterality: N/A;    Social History  Substance Use Topics  .  Smoking status: Former Smoker    Packs/day: 1.00    Years: 20.00    Types: Cigarettes    Quit date: 06/10/1986  . Smokeless tobacco: Never Used  . Alcohol use Yes     Comment: 1 quart every 3 days - 24 oz cans    Family History  Problem Relation Age of Onset  . Diabetes Maternal Uncle   . Diabetes Maternal Grandmother   . Colon cancer Neg Hx   . Colon polyps Neg Hx   . Rectal cancer Neg Hx   . Stomach cancer Neg Hx   . Esophageal cancer Neg Hx     No Known Allergies  Medication list has been reviewed and updated.  Current Outpatient Prescriptions on File Prior to Visit  Medication Sig Dispense Refill  . amLODipine (NORVASC) 10 MG tablet TAKE 1 TABLET(10 MG) BY MOUTH DAILY 90 tablet 3  . aspirin EC 81 MG tablet Take 81 mg by mouth daily.    Marland Kitchen azelastine (OPTIVAR) 0.05 % ophthalmic solution Place 1 drop into both eyes 2 (two) times daily. 6 mL 12  . blood glucose meter kit and supplies Dispense based on patient and insurance preference. Use up to four times daily as directed. (FOR ICD-9 250.00, 250.01). 1 each 0  . carisoprodol (SOMA) 350 MG tablet Take 1 tablet (350 mg total) by mouth 3 (three) times daily. 90 tablet 1  . furosemide (LASIX) 40 MG tablet TAKE 1 TABLET(40 MG) BY MOUTH DAILY 90 tablet 3  . gabapentin (NEURONTIN) 300 MG capsule Take 3 in the morning, 3 at noon and 3 in the evening; 9 total daily 270 capsule 2  . lisinopril (PRINIVIL,ZESTRIL) 20 MG tablet Take 1 tablet (20 mg total) by mouth daily. 90 tablet 0  . metFORMIN (GLUCOPHAGE) 500 MG tablet Take 500 mg by mouth 2 (two) times daily.    . metFORMIN (GLUCOPHAGE) 500 MG tablet TAKE 2 TABLETS BY MOUTH IN THE MORNING AND 1 TABLET IN THE EVENING 270 tablet 2  . morphine (MS CONTIN) 15 MG 12 hr tablet Take 1 tablet (15 mg total) by mouth every 12 (twelve) hours. To fill 30 days after rx 60 tablet 0  . morphine (MS CONTIN) 15 MG 12 hr tablet Take 1 tablet (15 mg total) by mouth every 12 (twelve) hours. 60 tablet 0  .  NARCAN 4 MG/0.1ML LIQD nasal spray kit CALL 911 AND U 1 SPR IN 1 NOS . REPEAT AFTRER 3 MIN IF NO OR MINIMAL RESPONSE  0  . ONETOUCH DELICA LANCETS 31S MISC TEST BLOOD SUGAR TWICE DAILY 100 each 3  . ONETOUCH VERIO test strip USE AS DIRECTED 100 each 0  .  Oxycodone HCl 10 MG TABS Take 0.5-1 tablets (5-10 mg total) by mouth every 4 (four) hours as needed. This is a 30 day supply.  To fill 30 days after rx 70 tablet 0  . Oxycodone HCl 10 MG TABS Take 0.5-1 tablets (5-10 mg total) by mouth every 4 (four) hours as needed. This is a 30 day supply. 70 tablet 0  . Polyethyl Glycol-Propyl Glycol (SYSTANE OP) Apply to eye.    . polyethylene glycol powder (GLYCOLAX/MIRALAX) powder Take 1 Container by mouth once.    . sildenafil (REVATIO) 20 MG tablet Take 1 tablet by mouth as directed 30 minutes before intercourse 50 tablet 0  . simvastatin (ZOCOR) 10 MG tablet TAKE 1 TABLET BY MOUTH AT BEDTIME 90 tablet 3  . traZODone (DESYREL) 50 MG tablet Take 0.5 tablets (25 mg total) by mouth at bedtime as needed for sleep. 45 tablet 3  . venlafaxine (EFFEXOR) 75 MG tablet TAKE 1 TABLET(75 MG) BY MOUTH TWICE DAILY 180 tablet 3   No current facility-administered medications on file prior to visit.     Review of Systems:  As per HPI- otherwise negative.   Physical Examination: Vitals:   01/13/17 1115  BP: (!) 158/82  Pulse: (!) 110  Temp: 98.2 F (36.8 C)   Vitals:   01/13/17 1115  Weight: 254 lb 12.8 oz (115.6 kg)  Height: 5' 7" (1.702 m)   Body mass index is 39.91 kg/m. Ideal Body Weight: Weight in (lb) to have BMI = 25: 159.3  GEN: WDWN, NAD, Non-toxic, A & O x 3, obese, otherwise looks well HEENT: Atraumatic, Normocephalic. Neck supple. No masses, No LAD. Ears and Nose: No external deformity. CV: RRR, No M/G/R. No JVD. No thrill. No extra heart sounds. PULM: CTA B, no wheezes, crackles, rhonchi. No retractions. No resp. distress. No accessory muscle use. ABD: S, NT, ND, +BS. No rebound. No  HSM. EXTR: No c/c/e on left.  S/p right BKA NEURO Normal gait.  PSYCH: Normally interactive. Conversant. Not depressed or anxious appearing.  Calm demeanor.    Assessment and Plan: Essential hypertension, benign - Plan: Comprehensive metabolic panel  Chronic low back pain without sciatica, unspecified back pain laterality - Plan: morphine (MS CONTIN) 15 MG 12 hr tablet, Oxycodone HCl 10 MG TABS, morphine (MS CONTIN) 15 MG 12 hr tablet, Oxycodone HCl 10 MG TABS, DISCONTINUED: morphine (MS CONTIN) 15 MG 12 hr tablet, DISCONTINUED: Oxycodone HCl 10 MG TABS  Controlled type 2 diabetes mellitus with diabetic neuropathy, with long-term current use of insulin (HCC)  Class 2 severe obesity due to excess calories with serious comorbidity and body mass index (BMI) of 35.0 to 35.9 in adult (HCC)  S/P AKA (above knee amputation) unilateral, right (HCC)  Hyponatremia - Plan: Comprehensive metabolic panel  Anemia, unspecified type - Plan: CBC  Here today for a follow-up visit Did 3 month refills of his oxycodone and MS contin.  Justin Stone is not quite due yet Noted hyponatremia and anemia at last labs- will recheck today Justin Stone knows that we will need to show mutual respect to continue our doctor pt relationship and he plans to do so.  Will not dismiss him at this time  Will plan further follow- up pending labs. Recheck in 3 months   Signed Justin Blinks, MD  Received his labs and called 6/28 [facility] He has had worse hyponatremia in the past; likely due to lasix.  Not dangerous at this time but we need ot monitor closely.  ? Can  we decrease his lasix Also will call in iron supplement for him  Called him to go over his labs on 7/1.  He appears to have iron deficiency.  He will take the iron as rx- I am also going to send him a stool heme test.   Advised him to not over- hydrate as his sodium is a bit low.  He denies drinking a lot of beer lately.   He will plan on a repeat CBC and BMP in about 3  months when he is due for refills Also refilled his soma today as this was due    Results for orders placed or performed in visit on 01/13/17  Comprehensive metabolic panel  Result Value Ref Range   Sodium 131 (L) 135 - 145 mEq/L   Potassium 4.7 3.5 - 5.1 mEq/L   Chloride 101 96 - 112 mEq/L   CO2 21 19 - 32 mEq/L   Glucose, Bld 134 (H) 70 - 99 mg/dL   BUN 18 6 - 23 mg/dL   Creatinine, Ser 1.27 0.40 - 1.50 mg/dL   Total Bilirubin 0.3 0.2 - 1.2 mg/dL   Alkaline Phosphatase 85 39 - 117 U/L   AST 36 0 - 37 U/L   ALT 45 0 - 53 U/L   Total Protein 8.5 (H) 6.0 - 8.3 g/dL   Albumin 4.3 3.5 - 5.2 g/dL   Calcium 10.0 8.4 - 10.5 mg/dL   GFR 75.75 >60.00 mL/min  CBC  Result Value Ref Range   WBC 7.4 4.0 - 10.5 K/uL   RBC 3.68 (L) 4.22 - 5.81 Mil/uL   Platelets 218.0 150.0 - 400.0 K/uL   Hemoglobin 9.9 (L) 13.0 - 17.0 g/dL   HCT 31.2 (L) 39.0 - 52.0 %   MCV 84.8 78.0 - 100.0 fl   MCHC 31.8 30.0 - 36.0 g/dL   RDW 17.3 (H) 11.5 - 15.5 %   Ferritin also low  Lab Results  Component Value Date   FERRITIN 14.0 (L) 01/14/2017

## 2017-01-11 NOTE — Telephone Encounter (Signed)
I refilled his lisinopril for a year in March- [facility] was listed as his pharmacy at that time, so that is where I sent the rx.  I am sorry that he is frustrated and that I am just getting this message today, but I am not ok with how he is talking to my staff or how is is talking about me.  Will ask [place] to send dismissal letter

## 2017-01-11 NOTE — Telephone Encounter (Signed)
Pt called back very upset that meds have not been sent in. Pt stated Dr. Patsy Lageropland needs to get off her a** and send it in. His day has already started and he is out of meds. I advised [facility] with [facility]. Pt states he does not use [facility].   [facility] [address] [facility]

## 2017-01-12 ENCOUNTER — Encounter: Payer: Self-pay | Admitting: Family Medicine

## 2017-01-13 ENCOUNTER — Ambulatory Visit (INDEPENDENT_AMBULATORY_CARE_PROVIDER_SITE_OTHER): Payer: Medicare Other | Admitting: Family Medicine

## 2017-01-13 ENCOUNTER — Encounter: Payer: Self-pay | Admitting: Family Medicine

## 2017-01-13 VITALS — BP 142/78 | HR 96 | Temp 98.2°F | Ht 67.0 in | Wt 254.8 lb

## 2017-01-13 DIAGNOSIS — G8929 Other chronic pain: Secondary | ICD-10-CM

## 2017-01-13 DIAGNOSIS — Z794 Long term (current) use of insulin: Secondary | ICD-10-CM

## 2017-01-13 DIAGNOSIS — Z89611 Acquired absence of right leg above knee: Secondary | ICD-10-CM

## 2017-01-13 DIAGNOSIS — M545 Low back pain: Secondary | ICD-10-CM

## 2017-01-13 DIAGNOSIS — Z6835 Body mass index (BMI) 35.0-35.9, adult: Secondary | ICD-10-CM

## 2017-01-13 DIAGNOSIS — E1140 Type 2 diabetes mellitus with diabetic neuropathy, unspecified: Secondary | ICD-10-CM

## 2017-01-13 DIAGNOSIS — D649 Anemia, unspecified: Secondary | ICD-10-CM | POA: Diagnosis not present

## 2017-01-13 DIAGNOSIS — E871 Hypo-osmolality and hyponatremia: Secondary | ICD-10-CM

## 2017-01-13 DIAGNOSIS — I10 Essential (primary) hypertension: Secondary | ICD-10-CM | POA: Diagnosis not present

## 2017-01-13 LAB — CBC
HEMATOCRIT: 31.2 % — AB (ref 39.0–52.0)
HEMOGLOBIN: 9.9 g/dL — AB (ref 13.0–17.0)
MCHC: 31.8 g/dL (ref 30.0–36.0)
MCV: 84.8 fl (ref 78.0–100.0)
Platelets: 218 10*3/uL (ref 150.0–400.0)
RBC: 3.68 Mil/uL — ABNORMAL LOW (ref 4.22–5.81)
RDW: 17.3 % — AB (ref 11.5–15.5)
WBC: 7.4 10*3/uL (ref 4.0–10.5)

## 2017-01-13 LAB — COMPREHENSIVE METABOLIC PANEL
ALT: 45 U/L (ref 0–53)
AST: 36 U/L (ref 0–37)
Albumin: 4.3 g/dL (ref 3.5–5.2)
Alkaline Phosphatase: 85 U/L (ref 39–117)
BILIRUBIN TOTAL: 0.3 mg/dL (ref 0.2–1.2)
BUN: 18 mg/dL (ref 6–23)
CALCIUM: 10 mg/dL (ref 8.4–10.5)
CHLORIDE: 101 meq/L (ref 96–112)
CO2: 21 mEq/L (ref 19–32)
Creatinine, Ser: 1.27 mg/dL (ref 0.40–1.50)
GFR: 75.75 mL/min (ref >60.00)
GLUCOSE: 134 mg/dL — AB (ref 70–99)
POTASSIUM: 4.7 meq/L (ref 3.5–5.1)
Sodium: 131 mEq/L — ABNORMAL LOW (ref 135–145)
Total Protein: 8.5 g/dL — ABNORMAL HIGH (ref 6.0–8.3)

## 2017-01-13 MED ORDER — MORPHINE SULFATE ER 15 MG PO TBCR: 15.0000 mg | EXTENDED_RELEASE_TABLET | Freq: Two times a day (BID) | ORAL | 0 refills | Status: DC

## 2017-01-13 MED ORDER — OXYCODONE HCL 10 MG PO TABS: 5.0000 mg | ORAL_TABLET | ORAL | 0 refills | Status: DC | PRN

## 2017-01-13 NOTE — Patient Instructions (Signed)
Please see me in about 3 months to do your refills and check on your blood pressure.   Please do work on gradual weight loss  We refilled your pain medications for the next 3 months todayh

## 2017-01-14 ENCOUNTER — Other Ambulatory Visit (INDEPENDENT_AMBULATORY_CARE_PROVIDER_SITE_OTHER): Payer: Medicare Other

## 2017-01-14 ENCOUNTER — Telehealth: Payer: Self-pay | Admitting: Emergency Medicine

## 2017-01-14 DIAGNOSIS — D649 Anemia, unspecified: Secondary | ICD-10-CM

## 2017-01-14 LAB — FERRITIN: FERRITIN: 14 ng/mL — AB (ref 22.0–322.0)

## 2017-01-14 NOTE — Telephone Encounter (Signed)
Add on request faxed to lab for Ferritin Dx: Anemia.

## 2017-01-20 MED ORDER — FERROUS SULFATE 325 (65 FE) MG PO TABS: 325.0000 mg | ORAL_TABLET | Freq: Two times a day (BID) | ORAL | 3 refills | Status: AC

## 2017-01-20 NOTE — Addendum Note (Signed)
Addended by: Abbe AmsterdamOPLAND, Kana Reimann C on: 01/20/2017 02:46 PM   Modules accepted: Orders

## 2017-01-21 ENCOUNTER — Other Ambulatory Visit: Payer: Self-pay | Admitting: Family Medicine

## 2017-01-23 ENCOUNTER — Encounter: Payer: Self-pay | Admitting: Family Medicine

## 2017-01-23 MED ORDER — CARISOPRODOL 350 MG PO TABS: 350.0000 mg | ORAL_TABLET | Freq: Three times a day (TID) | ORAL | 2 refills | Status: DC

## 2017-01-23 NOTE — Addendum Note (Signed)
Addended by: Abbe AmsterdamOPLAND, JESSICA C on: 01/23/2017 04:07 PM   Modules accepted: Orders

## 2017-01-24 ENCOUNTER — Other Ambulatory Visit: Payer: Self-pay | Admitting: Family Medicine

## 2017-02-10 ENCOUNTER — Ambulatory Visit (HOSPITAL_BASED_OUTPATIENT_CLINIC_OR_DEPARTMENT_OTHER): Payer: Medicare Other | Admitting: Physical Medicine & Rehabilitation

## 2017-02-10 ENCOUNTER — Encounter: Payer: Medicare Other | Attending: Physical Medicine & Rehabilitation

## 2017-02-10 ENCOUNTER — Encounter: Payer: Self-pay | Admitting: Physical Medicine & Rehabilitation

## 2017-02-10 VITALS — BP 146/84 | HR 100

## 2017-02-10 DIAGNOSIS — I10 Essential (primary) hypertension: Secondary | ICD-10-CM | POA: Diagnosis not present

## 2017-02-10 DIAGNOSIS — M47816 Spondylosis without myelopathy or radiculopathy, lumbar region: Secondary | ICD-10-CM | POA: Diagnosis not present

## 2017-02-10 DIAGNOSIS — E119 Type 2 diabetes mellitus without complications: Secondary | ICD-10-CM | POA: Insufficient documentation

## 2017-02-10 NOTE — Patient Instructions (Signed)
If your right-sided back pain starts increasing that may be a sign that the radiofrequency neurotomy is starting to wear off. Please call to make a appointment for repeat procedure

## 2017-02-10 NOTE — Progress Notes (Signed)
Subjective:    Patient ID: Justin Stone, male    DOB: Dec 21, 1960, 56 y.o.   MRN: [id number]  HPI  Right L3, L4, L5 medial branch RFA March 2018 Left L3-4-5 MB RFA 11/11/2016 LLE numbness for several years, on high dose gabapentin for 6110yrs  We discussed that he has not kept good track of his lumbar pain after his radiofrequency ablations. He does have some variation of pain from day-to-day. In the lumbar spine, but also complains of left lower extremity pain and bilateral hand pain. He does have a history of diabetes. His hemoglobin A1c recently was normal. However, it was up to 9.0 last year Pain Inventory Average Pain 8 Pain Right Now 7 My pain is sharp, burning, stabbing and tingling  In the last 24 hours, has pain interfered with the following? General activity 8 Relation with others 8 Enjoyment of life 8 What TIME of day is your pain at its worst? evening Sleep (in general) Fair  Pain is worse with: . Pain improves with: medication Relief from Meds: 7  Mobility walk without assistance use a cane ability to climb steps?  yes  Function retired  Neuro/Psych numbness tingling trouble walking spasms dizziness  Prior Studies Any changes since last visit?  no  Physicians involved in your care Any changes since last visit?  no   Family History  Problem Relation Age of Onset  . Diabetes Maternal Uncle   . Diabetes Maternal Grandmother   . Colon cancer Neg Hx   . Colon polyps Neg Hx   . Rectal cancer Neg Hx   . Stomach cancer Neg Hx   . Esophageal cancer Neg Hx    Social History   Social History  . Marital status: Single    Spouse name: N/A  . Number of children: N/A  . Years of education: N/A   Social History Main Topics  . Smoking status: Former Smoker    Packs/day: 1.00    Years: 20.00    Types: Cigarettes    Quit date: 06/10/1986  . Smokeless tobacco: Never Used  . Alcohol use Yes     Comment: 1 quart every 3 days - 24 oz cans  . Drug use:  No  . Sexual activity: Not on file   Other Topics Concern  . Not on file   Social History Narrative  . No narrative on file   Past Surgical History:  Procedure Laterality Date  . HERNIA REPAIR Left    LIH  . INSERTION OF MESH N/A 06/15/2016   Procedure: INSERTION OF MESH;  Surgeon: Gaynelle AduEric Wilson, MD;  Location: [facility];  Service: General;  Laterality: N/A;  . right leg removal     for diabetes, wears prosthesis -Right Below knee since '15  . UMBILICAL HERNIA REPAIR N/A 06/15/2016   Procedure: LAPAROSCOPIC ASSISTED REPAIR OF  UMBILICAL HERNIA;  Surgeon: Gaynelle AduEric Wilson, MD;  Location: [facility];  Service: General;  Laterality: N/A;   Past Medical History:  Diagnosis Date  . Allergy    eye allergies  . Anxiety   . Arthritis   . Diabetes mellitus without complication (HCC)    takes oral meds now only  . GERD (gastroesophageal reflux disease)    past hx > 2 yrs ago  . Hepatitis    "Hepatitis C"- tx. with Harvoni- now testing negative.  . Hyperlipidemia   . Hypertension   . Neuromuscular disorder (HCC)    neuropathy hands/ feet.  . Osteopenia   .  Prosthesis adjustment    right leg- below knee(weighs 10 lbs.)   There were no vitals taken for this visit.  Opioid Risk Score:   Fall Risk Score:  `1  Depression screen PHQ 2/9  Depression screen [facility] 2/9 11/14/2015 10/14/2015 09/16/2015 08/26/2015 05/26/2015 05/06/2015  Decreased Interest 0 2 0 0 0 1  Down, Depressed, Hopeless 0 0 0 0 0 0  PHQ - 2 Score 0 2 0 0 0 1  Altered sleeping - 3 - - 1 1  Tired, decreased energy - 1 - - 0 0  Change in appetite - 0 - - 0 0  Feeling bad or failure about yourself  - 0 - - 0 0  Trouble concentrating - 0 - - 0 0  Moving slowly or fidgety/restless - 0 - - 0 0  Suicidal thoughts - 0 - - 0 0  PHQ-9 Score - 6 - - 1 2     Review of Systems  Constitutional: Negative.   HENT: Negative.   Eyes: Negative.   Respiratory: Negative.   Cardiovascular: Negative.   Gastrointestinal: Negative.     Endocrine: Negative.   Genitourinary: Negative.   Musculoskeletal: Negative.   Skin: Negative.   Allergic/Immunologic: Negative.   Neurological: Negative.   Hematological: Negative.   Psychiatric/Behavioral: Negative.   All other systems reviewed and are negative.      Objective:   Physical Exam  Constitutional: He is oriented to person, place, and time. He appears well-developed and well-nourished. No distress.  HENT:  Head: Normocephalic and atraumatic.  Eyes: Pupils are equal, round, and reactive to light.  Neck: Normal range of motion.  Neurological: He is alert and oriented to person, place, and time.  Skin: Skin is warm and dry. No erythema.  Psychiatric: He has a normal mood and affect.  Nursing note and vitals reviewed.   Right below-knee amputation. Low back. No tenderness. Palpation lumbosacral junction. He is limited range of motion of lumbar spine with flexion and extension as well as lateral bending. Left straight leg raising is normal. Motor strength is 5/5 in the left hip flexion, extension, ankle dorsiflexion. There is decreased sensation globally knee. The left lower extremity. Left foot and ankle have no evidence of lesions.      Assessment & Plan:  1. Lumbar spondylosis without myelopathy, chronic low back pain. He has undergone radiofrequency ablation, bilateral L3, L4, L5 medial branch/dorsal ramus. Poor historian. Difficult to state degree of pain relief. We discussed that as time moves on, if his pain starts worsening on the right side first, that can indicate the radiofrequency ablation is starting to wear off. Repeat not sooner than 6 months. He is currently 4 months post on the right side and 3 months post on the left side  Patient will call back when symptoms increase.

## 2017-03-22 ENCOUNTER — Other Ambulatory Visit: Payer: Self-pay | Admitting: Family Medicine

## 2017-03-24 ENCOUNTER — Other Ambulatory Visit: Payer: Self-pay | Admitting: Family Medicine

## 2017-03-24 DIAGNOSIS — G8929 Other chronic pain: Secondary | ICD-10-CM

## 2017-03-24 DIAGNOSIS — M545 Low back pain: Principal | ICD-10-CM

## 2017-03-24 NOTE — Telephone Encounter (Signed)
Refaxed/pharm not rec 

## 2017-03-26 ENCOUNTER — Other Ambulatory Visit: Payer: Self-pay | Admitting: Family Medicine

## 2017-04-08 ENCOUNTER — Other Ambulatory Visit: Payer: Self-pay | Admitting: Family Medicine

## 2017-04-16 NOTE — Progress Notes (Addendum)
[place] at [facility] [address], [place], [place] [address] [phone]  Date:  04/18/2017   Name:  Justin Stone   DOB:  [birth year]   MRN:  [id number]  PCP:  Darreld Mclean, MD    Chief Complaint: Follow-up (Pt here for follow up. Flu vaccine today. )   History of Present Illness:  Justin Stone is a 56 y.o. very pleasant male patient who presents with the following:  History of HTN, hyperlipidemia, DM, s/o right BKA due to diabetes complications, chronic pain.   He saw Dr. Letta Pate, with pain management, in July: 1. Lumbar spondylosis without myelopathy, chronic low back pain. He has undergone radiofrequency ablation, bilateral L3, L4, L5 medial branch/dorsal ramus. Poor historian. Difficult to state degree of pain relief. We discussed that as time moves on, if his pain starts worsening on the right side first, that can indicate the radiofrequency ablation is starting to wear off. Repeat not sooner than 6 months. He is currently 4 months post on the right side and 3 months post on the left side  Indication for chronic opioid: phantom limb pain Medication and dose: soma 350 TID, oxycodone 10, MSER 15 BID # pills per month: soma 90, oxycodone 70, MSIR 60 Last UDS date: 1/18 Pain contract signed (Y/N): 1/18         Date narcotic database last reviewed (include red flags): 9/22, report printed  He last filled oxycodone 10 on 8/26, MSIR on 8/26, Soma on 8/08  Needs a repeat BMP and CBC today, also a UDS is due  His brother did finally move out of his home, and he moved to a new home. He is enjoying having his own place, peace and quiet, he does not worry about anyone taking his medications   He is taking gabapentin for his nerve pain- 900 TID, plus "a couple of extra pills" most days.  He wonders if we can increase the dose of this medication He did try some apple cider vinegar for swelling in his left leg- this  helped a lot and his leg is much less swollen   Jasaiah needs me to call his prosthetic tech, [employer], to discuss his current leg prosthetic.   Pt needs an order for a socket change, right side, transtibial.  Dx of right transtibial amputation. Will fax to [facility].    Tanyon needs the socket change due to significant change in the shape of the limb due to weight change.  He is k3 activity level.  Achillies was not able to walk for a few days recently due to pain in his leg  Wt Readings from Last 3 Encounters:  04/18/17 238 lb 6.4 oz (108.1 kg)  01/13/17 254 lb 12.8 oz (115.6 kg)  10/07/16 251 lb 12.8 oz (114.2 kg)   He has lose some weight recently which is great  Lab Results  Component Value Date   HGBA1C 4.8 10/07/2016   Pt also states that he took viagra in the past which he tolerated well, no CP or other SE noted.  Wonders if I can refill this for him today   BP Readings from Last 3 Encounters:  04/18/17 130/82  02/10/17 (!) 146/84  01/13/17 (!) 142/78    Patient Active Problem List   Diagnosis Date Noted  . Insomnia 05/13/2016  . Umbilical hernia without obstruction and without gangrene 05/13/2016  . Spondylosis of lumbar region without myelopathy or  radiculopathy 07/22/2015  . Obesity 06/29/2015  . Phantom pain following amputation of lower limb ([place]) 06/06/2015  . Type II diabetes mellitus, well controlled ([place]) 03/26/2015  . S/P BKA (below knee amputation) ([place]) 03/26/2015  . Alcoholic cirrhosis of liver without ascites ([place]) 02/22/2015  . Chronic hepatitis C without hepatic coma ([place]) 02/22/2015  . Chronic low back pain 12/29/2014  . Essential hypertension, benign 12/29/2014  . Hyperlipidemia LDL goal <100 12/29/2014    Past Medical History:  Diagnosis Date  . Allergy    eye allergies  . Anxiety   . Arthritis   . Diabetes mellitus without complication ([place])    takes oral meds now only  . GERD (gastroesophageal reflux disease)    past hx > 2 yrs ago   . Hepatitis    "Hepatitis C"- tx. with Harvoni- now testing negative.  . Hyperlipidemia   . Hypertension   . Neuromuscular disorder (HCC)    neuropathy hands/ feet.  . Osteopenia   . Prosthesis adjustment    right leg- below knee(weighs 10 lbs.)    Past Surgical History:  Procedure Laterality Date  . HERNIA REPAIR Left    LIH  . INSERTION OF MESH N/A 06/15/2016   Procedure: INSERTION OF MESH;  Surgeon: Greer Pickerel, MD;  Location: [facility];  Service: General;  Laterality: N/A;  . right leg removal     for diabetes, wears prosthesis -Right Below knee since '15  . UMBILICAL HERNIA REPAIR N/A 06/15/2016   Procedure: LAPAROSCOPIC ASSISTED REPAIR OF  UMBILICAL HERNIA;  Surgeon: Greer Pickerel, MD;  Location: [facility];  Service: General;  Laterality: N/A;    Social History  Substance Use Topics  . Smoking status: Former Smoker    Packs/day: 1.00    Years: 20.00    Types: Cigarettes    Quit date: 06/10/1986  . Smokeless tobacco: Never Used  . Alcohol use Yes     Comment: 1 quart every 3 days - 24 oz cans    Family History  Problem Relation Age of Onset  . Diabetes Maternal Uncle   . Diabetes Maternal Grandmother   . Colon cancer Neg Hx   . Colon polyps Neg Hx   . Rectal cancer Neg Hx   . Stomach cancer Neg Hx   . Esophageal cancer Neg Hx     No Known Allergies  Medication list has been reviewed and updated.  Current Outpatient Prescriptions on File Prior to Visit  Medication Sig Dispense Refill  . amLODipine (NORVASC) 10 MG tablet TAKE 1 TABLET(10 MG) BY MOUTH DAILY 90 tablet 3  . aspirin EC 81 MG tablet Take 81 mg by mouth daily.    Marland Kitchen azelastine (OPTIVAR) 0.05 % ophthalmic solution Place 1 drop into both eyes 2 (two) times daily. 6 mL 12  . blood glucose meter kit and supplies Dispense based on patient and insurance preference. Use up to four times daily as directed. (FOR ICD-9 250.00, 250.01). 1 each 0  . ferrous sulfate 325 (65 FE) MG tablet Take 1 tablet (325 mg total)  by mouth 2 (two) times daily with a meal. If constipation take just one a day 60 tablet 3  . furosemide (LASIX) 40 MG tablet TAKE 1 TABLET(40 MG) BY MOUTH DAILY 90 tablet 3  . glucose blood (ONETOUCH VERIO) test strip Check blood sugar daily as directed. 100 each 3  . lisinopril (PRINIVIL,ZESTRIL) 10 MG tablet TAKE 1 TABLET BY MOUTH DAILY 90 tablet 0  . lisinopril (PRINIVIL,ZESTRIL) 20 MG  tablet Take 1 tablet (20 mg total) by mouth daily. 90 tablet 0  . metFORMIN (GLUCOPHAGE) 500 MG tablet Take 500 mg by mouth 2 (two) times daily.    . metFORMIN (GLUCOPHAGE) 500 MG tablet TAKE 2 TABLETS BY MOUTH IN THE MORNING AND 1 TABLET IN THE EVENING 270 tablet 2  . NARCAN 4 MG/0.1ML LIQD nasal spray kit CALL 911 AND U 1 SPR IN 1 NOS . REPEAT AFTRER 3 MIN IF NO OR MINIMAL RESPONSE  0  . ONETOUCH DELICA LANCETS 17H MISC USE TO TEST BLOOD SUGAR TWICE DAILY 200 each 5  . Polyethyl Glycol-Propyl Glycol (SYSTANE OP) Apply to eye.    . polyethylene glycol powder (GLYCOLAX/MIRALAX) powder Take 1 Container by mouth once.    . sildenafil (REVATIO) 20 MG tablet Take 1 tablet by mouth as directed 30 minutes before intercourse 50 tablet 0  . simvastatin (ZOCOR) 10 MG tablet TAKE 1 TABLET BY MOUTH AT BEDTIME 90 tablet 3  . traZODone (DESYREL) 50 MG tablet Take 0.5 tablets (25 mg total) by mouth at bedtime as needed for sleep. 45 tablet 3  . venlafaxine (EFFEXOR) 75 MG tablet TAKE 1 TABLET(75 MG) BY MOUTH TWICE DAILY 180 tablet 3  . venlafaxine (EFFEXOR) 75 MG tablet TAKE 1 TABLET(75 MG) BY MOUTH TWICE DAILY 180 tablet 0   No current facility-administered medications on file prior to visit.     Review of Systems:  As per HPI- otherwise negative.   Physical Examination: Vitals:   04/18/17 1123  BP: 130/82  Pulse: 100  Temp: 98.4 F (36.9 C)  SpO2: 97%   Vitals:   04/18/17 1123  Weight: 238 lb 6.4 oz (108.1 kg)  Height: 5' 7" (1.702 m)   Body mass index is 37.34 kg/m. Ideal Body Weight: Weight in (lb) to  have BMI = 25: 159.3  GEN: WDWN, NAD, Non-toxic, A & O x 3, obese, looks well HEENT: Atraumatic, Normocephalic. Neck supple. No masses, No LAD. Ears and Nose: No external deformity. CV: RRR, No M/G/R. No JVD. No thrill. No extra heart sounds. PULM: CTA B, no wheezes, crackles, rhonchi. No retractions. No resp. distress. No accessory muscle use. ABD: S, NT, ND, +BS. No rebound. No HSM. EXTR: No c/c/e NEURO Normal gait for pt- s/p right BKA, uses a cane PSYCH: Normally interactive. Conversant. Not depressed or anxious appearing.  Calm demeanor.    Assessment and Plan: Chronic low back pain without sciatica, unspecified back pain laterality - Plan: carisoprodol (SOMA) 350 MG tablet, morphine (MS CONTIN) 15 MG 12 hr tablet, morphine (MS CONTIN) 15 MG 12 hr tablet, Oxycodone HCl 10 MG TABS, Oxycodone HCl 10 MG TABS, morphine (MS CONTIN) 15 MG 12 hr tablet, Oxycodone HCl 10 MG TABS, gabapentin (NEURONTIN) 300 MG capsule  Essential hypertension, benign  Type II diabetes mellitus, well controlled ([place]) - Plan: CBC, Basic metabolic panel, Hemoglobin A1c  Status post below knee amputation of right lower extremity ([place])  Immunization due - Plan: Flu Vaccine QUAD 36+ mos IM (Fluarix & Fluzone Quad PF  Erectile dysfunction, unspecified erectile dysfunction type - Plan: sildenafil (VIAGRA) 100 MG tablet  Here today for a medication follow-up meds refilled as above UDS today A1c pending Flu shot today He has used viagra in the past- will refill for him today He needs a socket change for his right leg due to weight change  Signed Lamar Blinks, MD  Received his labs as below We did not repeat his PSA- this needs to be  done asap.  Will alert pt and order for him Minor anemia- he did have a negative cologuard earlier this year which is reassuring.  Stable since 2015  Results for orders placed or performed in visit on 04/18/17  CBC  Result Value Ref Range   WBC 9.3 4.0 - 10.5 K/uL   RBC  3.79 (L) 4.22 - 5.81 Mil/uL   Platelets 204.0 150.0 - 400.0 K/uL   Hemoglobin 11.8 (L) 13.0 - 17.0 g/dL   HCT 37.5 (L) 39.0 - 52.0 %   MCV 99.0 78.0 - 100.0 fl   MCHC 31.6 30.0 - 36.0 g/dL   RDW 14.5 11.5 - 46.5 %  Basic metabolic panel  Result Value Ref Range   Sodium 139 135 - 145 mEq/L   Potassium 4.2 3.5 - 5.1 mEq/L   Chloride 106 96 - 112 mEq/L   CO2 22 19 - 32 mEq/L   Glucose, Bld 115 (H) 70 - 99 mg/dL   BUN 10 6 - 23 mg/dL   Creatinine, Ser 1.24 0.40 - 1.50 mg/dL   Calcium 10.0 8.4 - 10.5 mg/dL   GFR 77.79 >60.00 mL/min  Hemoglobin A1c  Result Value Ref Range   Hgb A1c MFr Bld 4.5 (L) 4.6 - 6.5 %

## 2017-04-18 ENCOUNTER — Ambulatory Visit (INDEPENDENT_AMBULATORY_CARE_PROVIDER_SITE_OTHER): Payer: Medicare Other | Admitting: Family Medicine

## 2017-04-18 VITALS — BP 130/82 | HR 100 | Temp 98.4°F | Ht 67.0 in | Wt 238.4 lb

## 2017-04-18 DIAGNOSIS — Z89511 Acquired absence of right leg below knee: Secondary | ICD-10-CM | POA: Diagnosis not present

## 2017-04-18 DIAGNOSIS — Z23 Encounter for immunization: Secondary | ICD-10-CM | POA: Diagnosis not present

## 2017-04-18 DIAGNOSIS — N529 Male erectile dysfunction, unspecified: Secondary | ICD-10-CM | POA: Diagnosis not present

## 2017-04-18 DIAGNOSIS — E119 Type 2 diabetes mellitus without complications: Secondary | ICD-10-CM | POA: Diagnosis not present

## 2017-04-18 DIAGNOSIS — M545 Low back pain: Secondary | ICD-10-CM

## 2017-04-18 DIAGNOSIS — R972 Elevated prostate specific antigen [PSA]: Secondary | ICD-10-CM

## 2017-04-18 DIAGNOSIS — G8929 Other chronic pain: Secondary | ICD-10-CM

## 2017-04-18 DIAGNOSIS — I10 Essential (primary) hypertension: Secondary | ICD-10-CM | POA: Diagnosis not present

## 2017-04-18 LAB — BASIC METABOLIC PANEL
BUN: 10 mg/dL (ref 6–23)
CHLORIDE: 106 meq/L (ref 96–112)
CO2: 22 meq/L (ref 19–32)
CREATININE: 1.24 mg/dL (ref 0.40–1.50)
Calcium: 10 mg/dL (ref 8.4–10.5)
GFR: 77.79 mL/min (ref >60.00)
Glucose, Bld: 115 mg/dL — ABNORMAL HIGH (ref 70–99)
POTASSIUM: 4.2 meq/L (ref 3.5–5.1)
Sodium: 139 mEq/L (ref 135–145)

## 2017-04-18 LAB — CBC
HCT: 37.5 % — ABNORMAL LOW (ref 39.0–52.0)
HEMOGLOBIN: 11.8 g/dL — AB (ref 13.0–17.0)
MCHC: 31.6 g/dL (ref 30.0–36.0)
MCV: 99 fl (ref 78.0–100.0)
PLATELETS: 204 10*3/uL (ref 150.0–400.0)
RBC: 3.79 Mil/uL — ABNORMAL LOW (ref 4.22–5.81)
RDW: 14.5 % (ref 11.5–15.5)
WBC: 9.3 10*3/uL (ref 4.0–10.5)

## 2017-04-18 LAB — HEMOGLOBIN A1C: HEMOGLOBIN A1C: 4.5 % — AB (ref 4.6–6.5)

## 2017-04-18 MED ORDER — OXYCODONE HCL 10 MG PO TABS: 5.0000 mg | ORAL_TABLET | ORAL | 0 refills | Status: DC | PRN

## 2017-04-18 MED ORDER — MORPHINE SULFATE ER 15 MG PO TBCR: 15.0000 mg | EXTENDED_RELEASE_TABLET | Freq: Two times a day (BID) | ORAL | 0 refills | Status: DC

## 2017-04-18 MED ORDER — SILDENAFIL CITRATE 100 MG PO TABS: 50.0000 mg | ORAL_TABLET | Freq: Every day | ORAL | 11 refills | Status: DC | PRN

## 2017-04-18 MED ORDER — CARISOPRODOL 350 MG PO TABS: 350.0000 mg | ORAL_TABLET | Freq: Three times a day (TID) | ORAL | 2 refills | Status: DC

## 2017-04-18 MED ORDER — GABAPENTIN 300 MG PO CAPS: 1200.0000 mg | ORAL_CAPSULE | Freq: Three times a day (TID) | ORAL | 5 refills | Status: DC

## 2017-04-18 NOTE — Patient Instructions (Signed)
It was good to see you today!  Please stop by the lab for a blood draw and urine drug screen today  I will send in the socket change order to [facility] for you  Please plan to see me in about 4 months for your next visit and take care I increased your gabapentin to 1200 mg (4 tablets) 3x a day. Please make this increase gradually

## 2017-04-19 ENCOUNTER — Telehealth: Payer: Self-pay | Admitting: Family Medicine

## 2017-04-19 NOTE — Addendum Note (Signed)
Addended by: Abbe Amsterdam C on: 04/19/2017 04:09 PM   Modules accepted: Orders

## 2017-04-19 NOTE — Telephone Encounter (Signed)
Caller name: Elsie Stain Relation to pt: From [facility] Call back number: [phone] direct tel for [facility]:  Reason for call: Aby from [facility] is calling stating needing a written rx and also clinical notes for a right leg prosthetic. Please advise. Fax number [phone] to have rx and documents sent to.

## 2017-04-21 NOTE — Telephone Encounter (Signed)
Faxed on 04/20/17

## 2017-04-21 NOTE — Telephone Encounter (Signed)
Received Rx for Right-side socket change for Dx of Transtibial amputation from [facility] [[facility] new name per paperwork], request provider sign and fax to [phone]

## 2017-05-02 NOTE — Telephone Encounter (Signed)
Document dropped off again just for provider to fill out by provider and to be faxed when ready to [phone]. (2 pages [facility].-)  Document put at front office tray.

## 2017-05-03 NOTE — Progress Notes (Deleted)
[place] at [facility] [address], [place], [place] [address] [phone]  Date:  05/04/2017   Name:  Justin Stone   DOB:  [birth year]   MRN:  [id number]  PCP:  Darreld Mclean, MD    Chief Complaint: No chief complaint on file.   History of Present Illness:  Justin Stone is a 56 y.o. very pleasant male patient who presents with the following:  Here today with concern of swelling of his left leg.  He is s/p right BKA - per my last note from September:  Indication for chronic opioid: phantom limb pain Medication and dose: soma 350 TID, oxycodone 10, MSER 15 BID # pills per month: soma 90, oxycodone 70, MSIR 60 Last UDS date: 1/18 Pain contract signed (Y/N): 1/18 Date narcotic database last reviewed (include red flags): 9/22, report printed  He last filled oxycodone 10 on 8/26, MSIR on 8/26, Soma on 8/08  Needs a repeat BMP and CBC today, also a UDS is due  His brother did finally move out of his home, and he moved to a new home. He is enjoying having his own place, peace and quiet, he does not worry about anyone taking his medications   He is taking gabapentin for his nerve pain- 900 TID, plus "a couple of extra pills" most days.  He wonders if we can increase the dose of this medication He did try some apple cider vinegar for swelling in his left leg- this helped a lot and his leg is much less swollen   Justin Stone needs me to call his prosthetic tech, Justin Stone, to discuss his current leg prosthetic.    Patient Active Problem List   Diagnosis Date Noted  . Insomnia 05/13/2016  . Umbilical hernia without obstruction and without gangrene 05/13/2016  . Spondylosis of lumbar region without myelopathy or radiculopathy 07/22/2015  . Obesity 06/29/2015  . Phantom pain following amputation of lower limb ([place]) 06/06/2015  . Type II diabetes mellitus, well controlled ([place]) 03/26/2015  . S/P BKA (below knee  amputation) ([place]) 03/26/2015  . Alcoholic cirrhosis of liver without ascites ([place]) 02/22/2015  . Chronic hepatitis C without hepatic coma ([place]) 02/22/2015  . Chronic low back pain 12/29/2014  . Essential hypertension, benign 12/29/2014  . Hyperlipidemia LDL goal <100 12/29/2014    Past Medical History:  Diagnosis Date  . Allergy    eye allergies  . Anxiety   . Arthritis   . Diabetes mellitus without complication ([place])    takes oral meds now only  . GERD (gastroesophageal reflux disease)    past hx > 2 yrs ago  . Hepatitis    "Hepatitis C"- tx. with Harvoni- now testing negative.  . Hyperlipidemia   . Hypertension   . Neuromuscular disorder (HCC)    neuropathy hands/ feet.  . Osteopenia   . Prosthesis adjustment    right leg- below knee(weighs 10 lbs.)    Past Surgical History:  Procedure Laterality Date  . HERNIA REPAIR Left    LIH  . INSERTION OF MESH N/A 06/15/2016   Procedure: INSERTION OF MESH;  Surgeon: Greer Pickerel, MD;  Location: [facility];  Service: General;  Laterality: N/A;  . right leg removal     for diabetes, wears prosthesis -Right Below knee since '15  . UMBILICAL HERNIA REPAIR N/A 06/15/2016   Procedure: LAPAROSCOPIC ASSISTED REPAIR OF  UMBILICAL HERNIA;  Surgeon: Greer Pickerel, MD;  Location: [facility];  Service: General;  Laterality: N/A;    Social History  Substance Use Topics  . Smoking status: Former Smoker    Packs/day: 1.00    Years: 20.00    Types: Cigarettes    Quit date: 06/10/1986  . Smokeless tobacco: Never Used  . Alcohol use Yes     Comment: 1 quart every 3 days - 24 oz cans    Family History  Problem Relation Age of Onset  . Diabetes Maternal Uncle   . Diabetes Maternal Grandmother   . Colon cancer Neg Hx   . Colon polyps Neg Hx   . Rectal cancer Neg Hx   . Stomach cancer Neg Hx   . Esophageal cancer Neg Hx     No Known Allergies  Medication list has been reviewed and updated.  Current Outpatient Prescriptions on File Prior to  Visit  Medication Sig Dispense Refill  . amLODipine (NORVASC) 10 MG tablet TAKE 1 TABLET(10 MG) BY MOUTH DAILY 90 tablet 3  . aspirin EC 81 MG tablet Take 81 mg by mouth daily.    Marland Kitchen azelastine (OPTIVAR) 0.05 % ophthalmic solution Place 1 drop into both eyes 2 (two) times daily. 6 mL 12  . blood glucose meter kit and supplies Dispense based on patient and insurance preference. Use up to four times daily as directed. (FOR ICD-9 250.00, 250.01). 1 each 0  . carisoprodol (SOMA) 350 MG tablet Take 1 tablet (350 mg total) by mouth 3 (three) times daily. 90 tablet 2  . ferrous sulfate 325 (65 FE) MG tablet Take 1 tablet (325 mg total) by mouth 2 (two) times daily with a meal. If constipation take just one a day 60 tablet 3  . furosemide (LASIX) 40 MG tablet TAKE 1 TABLET(40 MG) BY MOUTH DAILY 90 tablet 3  . gabapentin (NEURONTIN) 300 MG capsule Take 4 capsules (1,200 mg total) by mouth 3 (three) times daily. 360 capsule 5  . glucose blood (ONETOUCH VERIO) test strip Check blood sugar daily as directed. 100 each 3  . lisinopril (PRINIVIL,ZESTRIL) 10 MG tablet TAKE 1 TABLET BY MOUTH DAILY 90 tablet 0  . lisinopril (PRINIVIL,ZESTRIL) 20 MG tablet Take 1 tablet (20 mg total) by mouth daily. 90 tablet 0  . metFORMIN (GLUCOPHAGE) 500 MG tablet Take 500 mg by mouth 2 (two) times daily.    . metFORMIN (GLUCOPHAGE) 500 MG tablet TAKE 2 TABLETS BY MOUTH IN THE MORNING AND 1 TABLET IN THE EVENING 270 tablet 2  . morphine (MS CONTIN) 15 MG 12 hr tablet Take 1 tablet (15 mg total) by mouth every 12 (twelve) hours. To fill 60 days after rx 60 tablet 0  . morphine (MS CONTIN) 15 MG 12 hr tablet Take 1 tablet (15 mg total) by mouth every 12 (twelve) hours. 60 tablet 0  . morphine (MS CONTIN) 15 MG 12 hr tablet Take 1 tablet (15 mg total) by mouth every 12 (twelve) hours. To fill in 30 days 60 tablet 0  . NARCAN 4 MG/0.1ML LIQD nasal spray kit CALL 911 AND U 1 SPR IN 1 NOS . REPEAT AFTRER 3 MIN IF NO OR MINIMAL RESPONSE   0  . ONETOUCH DELICA LANCETS 62V MISC USE TO TEST BLOOD SUGAR TWICE DAILY 200 each 5  . Oxycodone HCl 10 MG TABS Take 0.5-1 tablets (5-10 mg total) by mouth every 4 (four) hours as needed. This is a 30 day supply.  To fill 60 days after rx 70 tablet 0  .  Oxycodone HCl 10 MG TABS Take 0.5-1 tablets (5-10 mg total) by mouth every 4 (four) hours as needed. This is a 30 day supply. 70 tablet 0  . Oxycodone HCl 10 MG TABS Take 0.5-1 tablets (5-10 mg total) by mouth every 4 (four) hours as needed. This is a 30 day supply.  To fill 30 days after rx 70 tablet 0  . Polyethyl Glycol-Propyl Glycol (SYSTANE OP) Apply to eye.    . polyethylene glycol powder (GLYCOLAX/MIRALAX) powder Take 1 Container by mouth once.    . sildenafil (REVATIO) 20 MG tablet Take 1 tablet by mouth as directed 30 minutes before intercourse 50 tablet 0  . sildenafil (VIAGRA) 100 MG tablet Take 0.5-1 tablets (50-100 mg total) by mouth daily as needed for erectile dysfunction. 5 tablet 11  . simvastatin (ZOCOR) 10 MG tablet TAKE 1 TABLET BY MOUTH AT BEDTIME 90 tablet 3  . traZODone (DESYREL) 50 MG tablet Take 0.5 tablets (25 mg total) by mouth at bedtime as needed for sleep. 45 tablet 3  . venlafaxine (EFFEXOR) 75 MG tablet TAKE 1 TABLET(75 MG) BY MOUTH TWICE DAILY 180 tablet 3  . venlafaxine (EFFEXOR) 75 MG tablet TAKE 1 TABLET(75 MG) BY MOUTH TWICE DAILY 180 tablet 0   No current facility-administered medications on file prior to visit.     Review of Systems:  As per HPI- otherwise negative.   Physical Examination: There were no vitals filed for this visit. There were no vitals filed for this visit. There is no height or weight on file to calculate BMI. Ideal Body Weight:    GEN: WDWN, NAD, Non-toxic, A & O x 3 HEENT: Atraumatic, Normocephalic. Neck supple. No masses, No LAD. Ears and Nose: No external deformity. CV: RRR, No M/G/R. No JVD. No thrill. No extra heart sounds. PULM: CTA B, no wheezes, crackles, rhonchi. No  retractions. No resp. distress. No accessory muscle use. ABD: S, NT, ND, +BS. No rebound. No HSM. EXTR: No c/c/e NEURO Normal gait.  PSYCH: Normally interactive. Conversant. Not depressed or anxious appearing.  Calm demeanor.  \  Assessment and Plan: ***  Signed Lamar Blinks, MD

## 2017-05-04 ENCOUNTER — Ambulatory Visit: Payer: Medicare Other | Admitting: Family Medicine

## 2017-05-04 DIAGNOSIS — Z0289 Encounter for other administrative examinations: Secondary | ICD-10-CM

## 2017-05-04 NOTE — Telephone Encounter (Signed)
Completed as much as possible on forms; forwarded to provider/SLS 10/10

## 2017-05-08 NOTE — Progress Notes (Addendum)
[facility] [place], [place], [place] [address] [phone]  Date:  05/09/2017   Name:  Justin Stone   DOB:  Oct 24, 1960   MRN:  [id number]  PCP:  Darreld Mclean, MD    Chief Complaint: Leg Swelling (c/o left leg swelling. )   History of Present Illness:  Justin Stone is a 56 y.o. very pleasant male patient who presents with the following:  I saw him a little under a month ago to follow-up on his chronic pain, and because he needed to have modifications made to his RT BKA prosthesiss  He notes that his left leg has been swollen- he has noted this off an on for 2-3 months. It will wax and wane, but does not seem to follow any pattern. It is not better in the am, etc He has not had this issue in the past  The leg does not hurt, but the bottom of the foot can feel numb No weeping from the leg  He is working on getting his modified prothesis for the right limb, has not gotten this as of yet  Wt Readings from Last 3 Encounters:  05/09/17 247 lb 12.8 oz (112.4 kg)  04/18/17 238 lb 6.4 oz (108.1 kg)  01/13/17 254 lb 12.8 oz (115.6 kg)   Pulse Readings from Last 3 Encounters:  05/09/17 (!) 115  04/18/17 100  02/10/17 100   He is still taking lasix 40 mg once a day He is not really sure why he was started on this med, but thinks it was for BP He really does not remember having any swelling in the past   He has been on amlodpine for about 18 months - 10 mg.  He did not notice any swelling when he first started this med however His DM is under very good control No CP or SOB Lab Results  Component Value Date   HGBA1C 4.5 (L) 04/18/2017     BP Readings from Last 3 Encounters:  05/09/17 122/72  04/18/17 130/82  02/10/17 (!) 146/84     Patient Active Problem List   Diagnosis Date Noted  . Insomnia 05/13/2016  . Umbilical hernia without obstruction and without gangrene 05/13/2016  . Spondylosis of  lumbar region without myelopathy or radiculopathy 07/22/2015  . Obesity 06/29/2015  . 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 Type II diabetes mellitus, well controlled ([place]) 03/26/2015  . S/P BKA (below knee amputation) ([place]) 03/26/2015  . Alcoholic cirrhosis of liver without ascites ([place]) 02/22/2015  . Chronic hepatitis C without hepatic coma ([place]) 02/22/2015  . Chronic low back pain 12/29/2014  . Essential hypertension, benign 12/29/2014  . Hyperlipidemia LDL goal <100 12/29/2014    Past Medical History:  Diagnosis Date  . Allergy    eye allergies  . Anxiety   . Arthritis   . Diabetes mellitus without complication ([place])    takes oral meds now only  . GERD (gastroesophageal reflux disease)    past hx > 2 yrs ago  . Hepatitis    "Hepatitis C"- tx. with Harvoni- now testing negative.  . Hyperlipidemia   . Hypertension   . Neuromuscular disorder ([facility])    neuropathy hands/ feet.  . Osteopenia   . Prosthesis adjustment    right leg- below knee(weighs 10 lbs.)    Past Surgical History:  Procedure Laterality Date  . HERNIA REPAIR Left  LIH  . INSERTION OF MESH N/A 06/15/2016   Procedure: INSERTION OF MESH;  Surgeon: Greer Pickerel, MD;  Location: [facility];  Service: General;  Laterality: N/A;  . right leg removal     for diabetes, wears prosthesis -Right Below knee since '15  . UMBILICAL HERNIA REPAIR N/A 06/15/2016   Procedure: LAPAROSCOPIC ASSISTED REPAIR OF  UMBILICAL HERNIA;  Surgeon: Greer Pickerel, MD;  Location: [facility];  Service: General;  Laterality: N/A;    Social History  Substance Use Topics  . Smoking status: Former Smoker    Packs/day: 1.00    Years: 20.00    Types: Cigarettes    Quit date: 06/10/1986  . Smokeless tobacco: Never Used  . Alcohol use Yes     Comment: 1 quart every 3 days - 24 oz cans    Family History  Problem Relation Age of Onset  . Diabetes Maternal Uncle   . Diabetes Maternal Grandmother   . Colon cancer Neg  Hx   . Colon polyps Neg Hx   . Rectal cancer Neg Hx   . Stomach cancer Neg Hx   . Esophageal cancer Neg Hx     No Known Allergies  Medication list has been reviewed and updated.  Current Outpatient Prescriptions on File Prior to Visit  Medication Sig Dispense Refill  . amLODipine (NORVASC) 10 MG tablet TAKE 1 TABLET(10 MG) BY MOUTH DAILY 90 tablet 3  . aspirin EC 81 MG tablet Take 81 mg by mouth daily.    Marland Kitchen azelastine (OPTIVAR) 0.05 % ophthalmic solution Place 1 drop into both eyes 2 (two) times daily. 6 mL 12  . blood glucose meter kit and supplies Dispense based on patient and insurance preference. Use up to four times daily as directed. (FOR ICD-9 250.00, 250.01). 1 each 0  . carisoprodol (SOMA) 350 MG tablet Take 1 tablet (350 mg total) by mouth 3 (three) times daily. 90 tablet 2  . ferrous sulfate 325 (65 FE) MG tablet Take 1 tablet (325 mg total) by mouth 2 (two) times daily with a meal. If constipation take just one a day 60 tablet 3  . furosemide (LASIX) 40 MG tablet TAKE 1 TABLET(40 MG) BY MOUTH DAILY 90 tablet 3  . gabapentin (NEURONTIN) 300 MG capsule Take 4 capsules (1,200 mg total) by mouth 3 (three) times daily. 360 capsule 5  . glucose blood (ONETOUCH VERIO) test strip Check blood sugar daily as directed. 100 each 3  . lisinopril (PRINIVIL,ZESTRIL) 10 MG tablet TAKE 1 TABLET BY MOUTH DAILY 90 tablet 0  . lisinopril (PRINIVIL,ZESTRIL) 20 MG tablet Take 1 tablet (20 mg total) by mouth daily. 90 tablet 0  . metFORMIN (GLUCOPHAGE) 500 MG tablet Take 500 mg by mouth 2 (two) times daily.    . metFORMIN (GLUCOPHAGE) 500 MG tablet TAKE 2 TABLETS BY MOUTH IN THE MORNING AND 1 TABLET IN THE EVENING 270 tablet 2  . morphine (MS CONTIN) 15 MG 12 hr tablet Take 1 tablet (15 mg total) by mouth every 12 (twelve) hours. To fill 60 days after rx 60 tablet 0  . morphine (MS CONTIN) 15 MG 12 hr tablet Take 1 tablet (15 mg total) by mouth every 12 (twelve) hours. 60 tablet 0  . morphine (MS  CONTIN) 15 MG 12 hr tablet Take 1 tablet (15 mg total) by mouth every 12 (twelve) hours. To fill in 30 days 60 tablet 0  . NARCAN 4 MG/0.1ML LIQD nasal spray kit CALL 911 AND U 1  SPR IN 1 NOS . REPEAT AFTRER 3 MIN IF NO OR MINIMAL RESPONSE  0  . ONETOUCH DELICA LANCETS 81E MISC USE TO TEST BLOOD SUGAR TWICE DAILY 200 each 5  . Oxycodone HCl 10 MG TABS Take 0.5-1 tablets (5-10 mg total) by mouth every 4 (four) hours as needed. This is a 30 day supply.  To fill 60 days after rx 70 tablet 0  . Oxycodone HCl 10 MG TABS Take 0.5-1 tablets (5-10 mg total) by mouth every 4 (four) hours as needed. This is a 30 day supply. 70 tablet 0  . Oxycodone HCl 10 MG TABS Take 0.5-1 tablets (5-10 mg total) by mouth every 4 (four) hours as needed. This is a 30 day supply.  To fill 30 days after rx 70 tablet 0  . Polyethyl Glycol-Propyl Glycol (SYSTANE OP) Apply to eye.    . polyethylene glycol powder (GLYCOLAX/MIRALAX) powder Take 1 Container by mouth once.    . sildenafil (REVATIO) 20 MG tablet Take 1 tablet by mouth as directed 30 minutes before intercourse 50 tablet 0  . sildenafil (VIAGRA) 100 MG tablet Take 0.5-1 tablets (50-100 mg total) by mouth daily as needed for erectile dysfunction. 5 tablet 11  . simvastatin (ZOCOR) 10 MG tablet TAKE 1 TABLET BY MOUTH AT BEDTIME 90 tablet 3  . traZODone (DESYREL) 50 MG tablet Take 0.5 tablets (25 mg total) by mouth at bedtime as needed for sleep. 45 tablet 3  . venlafaxine (EFFEXOR) 75 MG tablet TAKE 1 TABLET(75 MG) BY MOUTH TWICE DAILY 180 tablet 3  . venlafaxine (EFFEXOR) 75 MG tablet TAKE 1 TABLET(75 MG) BY MOUTH TWICE DAILY 180 tablet 0   No current facility-administered medications on file prior to visit.     Review of Systems:  As per HPI- otherwise negative.   Physical Examination: Vitals:   05/09/17 1112  BP: 122/72  Pulse: (!) 115  Temp: 99.1 F (37.3 C)  SpO2: 95%   Vitals:   05/09/17 1112  Weight: 247 lb 12.8 oz (112.4 kg)  Height: 5' 7"  (1.702  m)   Body mass index is 38.81 kg/m. Ideal Body Weight: Weight in (lb) to have BMI = 25: 159.3  GEN: WDWN, NAD, Non-toxic, A & O x 3, obese, otherwise looks well HEENT: Atraumatic, Normocephalic. Neck supple. No masses, No LAD. Ears and Nose: No external deformity. CV: RRR, No M/G/R. No JVD. No thrill. No extra heart sounds. PULM: CTA B, no wheezes, crackles, rhonchi. No retractions. No resp. distress. No accessory muscle use. ABD: S, NT, ND, +BS. No rebound. No HSM. EXTR: right BKA Left leg with 1+ swelling to the mid calf NEURO Normal gait.  PSYCH: Normally interactive. Conversant. Not depressed or anxious appearing.  Calm demeanor.    Assessment and Plan: Pain and swelling of left lower leg - Plan: Ambulatory referral to Neurology, CBC, Comprehensive metabolic panel, VAS [place] LOWER EXTREMITY VENOUS (DVT), US Venous Img Lower Unilateral Left, CANCELED: DOPPLER VENOUS LEGS BILATERAL  Here today with concern of left leg edema Will obtain US to rule out a clot.  Then plan to adjust medications as labs allow Also consider amlodipine as a possible culprit  Signed Lamar Blinks, MD  Received his labs and doppler, gave him a call 10/16 No DVT However his creat has bumped up- cannot increase his lasix.  We are unsure why this happened- he denies any recent illness or dehydration Will try having him decrease his amlodipine to 5 mg to see if this  may improve his swelling.  Plan to repeat his BMP this coming Friday to monitor.  For the moment will stay on 40 of lasix a day because I am afraid his swelling may be intolerable if we stop.  However if renal function not improved at recheck we will have to decrease or stop lasix He often has mild elevation of LFTs Mild, fluctuating anemia is longstanding, recent cologuard was negative    Results for orders placed or performed in visit on 05/09/17  CBC  Result Value Ref Range   WBC 8.8 4.0 - 10.5 K/uL   RBC 3.30 (L) 4.22 - 5.81 Mil/uL    Platelets 183.0 150.0 - 400.0 K/uL   Hemoglobin 10.6 (L) 13.0 - 17.0 g/dL   HCT 32.8 (L) 39.0 - 52.0 %   MCV 99.5 78.0 - 100.0 fl   MCHC 32.4 30.0 - 36.0 g/dL   RDW 14.3 11.5 - 15.5 %  Comprehensive metabolic panel  Result Value Ref Range   Sodium 132 (L) 135 - 145 mEq/L   Potassium 5.2 (H) 3.5 - 5.1 mEq/L   Chloride 99 96 - 112 mEq/L   CO2 19 19 - 32 mEq/L   Glucose, Bld 112 (H) 70 - 99 mg/dL   BUN 18 6 - 23 mg/dL   Creatinine, Ser 1.93 (H) 0.40 - 1.50 mg/dL   Total Bilirubin 0.4 0.2 - 1.2 mg/dL   Alkaline Phosphatase 83 39 - 117 U/L   AST 67 (H) 0 - 37 U/L   ALT 53 0 - 53 U/L   Total Protein 7.9 6.0 - 8.3 g/dL   Albumin 3.9 3.5 - 5.2 g/dL   Calcium 9.3 8.4 - 10.5 mg/dL   GFR 46.68 (L) >60.00 mL/min   US Venous Img Lower Unilateral Left  Result Date: 05/09/2017 CLINICAL DATA:  Lower extremity pain and edema EXAM: LEFT LOWER EXTREMITY VENOUS DUPLEX LER ULTRASOUND TECHNIQUE: Gray-scale sonography with graded compression, as well as color Doppler and duplex ultrasound were performed to evaluate the left lower extremity deep venous system from the level of the common femoral vein and including the common femoral, femoral, profunda femoral, popliteal and calf veins including the posterior tibial, peroneal and gastrocnemius veins when visible. The superficial great saphenous vein was also interrogated. Spectral Doppler was utilized to evaluate flow at rest and with distal augmentation maneuvers in the common femoral, femoral and popliteal veins. COMPARISON:  None. FINDINGS: Contralateral Common Femoral Vein: Respiratory phasicity is normal and symmetric with the symptomatic side. No evidence of thrombus. Normal compressibility. Common Femoral Vein: No evidence of thrombus. Normal compressibility, respiratory phasicity and response to augmentation. Saphenofemoral Junction: No evidence of thrombus. Normal compressibility and flow on color Doppler imaging. Profunda Femoral Vein: No evidence of  thrombus. Normal compressibility and flow on color Doppler imaging. Femoral Vein: No evidence of thrombus. Normal compressibility, respiratory phasicity and response to augmentation. Popliteal Vein: No evidence of thrombus. Normal compressibility, respiratory phasicity and response to augmentation. Calf Veins: No evidence of thrombus. Normal compressibility and flow on color Doppler imaging. Superficial Great Saphenous Vein: No evidence of thrombus. Normal compressibility. Venous Reflux:  None. Other Findings:  There is left lower extremity soft tissue edema. IMPRESSION: No evidence of deep venous thrombosis in the left lower extremity. Right common femoral vein also patent. There is left lower extremity soft tissue edema. Electronically Signed   By: Lowella Grip III M.D.   On: 05/09/2017 14:40

## 2017-05-09 ENCOUNTER — Encounter: Payer: Self-pay | Admitting: Family Medicine

## 2017-05-09 ENCOUNTER — Ambulatory Visit (HOSPITAL_BASED_OUTPATIENT_CLINIC_OR_DEPARTMENT_OTHER)
Admission: RE | Admit: 2017-05-09 | Discharge: 2017-05-09 | Disposition: A | Discharge status: Home / Self Care | Payer: Medicare Other | Source: Ambulatory Visit | Attending: Family Medicine | Admitting: Family Medicine

## 2017-05-09 ENCOUNTER — Ambulatory Visit (INDEPENDENT_AMBULATORY_CARE_PROVIDER_SITE_OTHER): Payer: Medicare Other | Admitting: Family Medicine

## 2017-05-09 VITALS — BP 122/72 | HR 100 | Temp 99.1°F | Ht 67.0 in | Wt 247.8 lb

## 2017-05-09 DIAGNOSIS — M79662 Pain in left lower leg: Secondary | ICD-10-CM

## 2017-05-09 DIAGNOSIS — R7989 Other specified abnormal findings of blood chemistry: Secondary | ICD-10-CM

## 2017-05-09 DIAGNOSIS — R600 Localized edema: Secondary | ICD-10-CM | POA: Insufficient documentation

## 2017-05-09 DIAGNOSIS — M7989 Other specified soft tissue disorders: Secondary | ICD-10-CM | POA: Insufficient documentation

## 2017-05-09 LAB — CBC
HCT: 32.8 % — ABNORMAL LOW (ref 39.0–52.0)
Hemoglobin: 10.6 g/dL — ABNORMAL LOW (ref 13.0–17.0)
MCHC: 32.4 g/dL (ref 30.0–36.0)
MCV: 99.5 fl (ref 78.0–100.0)
Platelets: 183 K/uL (ref 150.0–400.0)
RBC: 3.3 Mil/uL — ABNORMAL LOW (ref 4.22–5.81)
RDW: 14.3 % (ref 11.5–15.5)
WBC: 8.8 K/uL (ref 4.0–10.5)

## 2017-05-09 LAB — COMPREHENSIVE METABOLIC PANEL WITH GFR
ALT: 53 U/L (ref 0–53)
AST: 67 U/L — ABNORMAL HIGH (ref 0–37)
Albumin: 3.9 g/dL (ref 3.5–5.2)
Alkaline Phosphatase: 83 U/L (ref 39–117)
BUN: 18 mg/dL (ref 6–23)
CO2: 19 meq/L (ref 19–32)
Calcium: 9.3 mg/dL (ref 8.4–10.5)
Chloride: 99 meq/L (ref 96–112)
Creatinine, Ser: 1.93 mg/dL — ABNORMAL HIGH (ref 0.40–1.50)
GFR: 46.68 mL/min — ABNORMAL LOW
Glucose, Bld: 112 mg/dL — ABNORMAL HIGH (ref 70–99)
Potassium: 5.2 meq/L — ABNORMAL HIGH (ref 3.5–5.1)
Sodium: 132 meq/L — ABNORMAL LOW (ref 135–145)
Total Bilirubin: 0.4 mg/dL (ref 0.2–1.2)
Total Protein: 7.9 g/dL (ref 6.0–8.3)

## 2017-05-09 NOTE — Patient Instructions (Addendum)
We are going to get an ultrasound of your leg to make sure you do not have a clot, and I will check your kidneys and electrolytes today (blood test).  If you do have a blood clot we will treat it. Otherwise we will likely need to increase your lasix dose to move the fluid out of your leg.  I will also set you up to see neurology to discuss the numbness in your left foot  Please go to the imaging department on the ground floor of the medcenter at 1:20 to get an ultrasound of your leg.   I will give you a call when we get your ultrasound and labs back to discuss the next step

## 2017-05-10 ENCOUNTER — Telehealth: Payer: Self-pay | Admitting: *Deleted

## 2017-05-10 ENCOUNTER — Encounter: Payer: Self-pay | Admitting: Neurology

## 2017-05-10 NOTE — Telephone Encounter (Signed)
Received Clinical Utilization Review for prosthesis supplies; forwarded to provider/SLS 10/16

## 2017-05-10 NOTE — Addendum Note (Signed)
Addended by: Abbe Amsterdam C on: 05/10/2017 06:22 PM   Modules accepted: Orders

## 2017-06-02 DIAGNOSIS — Z89511 Acquired absence of right leg below knee: Secondary | ICD-10-CM | POA: Diagnosis not present

## 2017-06-14 ENCOUNTER — Telehealth: Payer: Self-pay | Admitting: Family Medicine

## 2017-06-14 NOTE — Telephone Encounter (Signed)
Copied from CRM [phone]. Topic: General - Other >> Jun 14, 2017  2:59 PM Elliot GaultBell, Tiffany M wrote: [phone] [place] [place] [facility] [address] AT [facility] [address]  Reason for call:  Pharmacy states patient will run out of morphine (MS CONTIN) 15 MG 12 hr tablet and Oxycodone HCl 10 MG TABS before Friday and would like approval to refill medication, please advise

## 2017-06-14 NOTE — Telephone Encounter (Signed)
Copied from CRM #[id number]. Topic: General - Other >> Jun 14, 2017  2:59 PM Bell, Tiffany M wrote: [id number] - [place], [place] - [address] AT [address]  Reason for call:  Pharmacy states patient will run out of morphine (MS CONTIN) 15 MG 12 hr tablet and Oxycodone HCl 10 MG TABS before Friday and would like approval to refill medication, please advise 

## 2017-06-15 NOTE — Telephone Encounter (Signed)
I saw pt on 10/15-   N[facility]: filled both rx on 10/22- oxycodone and MSIR.  Ok to refill- called and gave ok to pharmacist

## 2017-07-06 ENCOUNTER — Ambulatory Visit (INDEPENDENT_AMBULATORY_CARE_PROVIDER_SITE_OTHER): Payer: Medicare Other | Admitting: Family Medicine

## 2017-07-06 ENCOUNTER — Ambulatory Visit (HOSPITAL_BASED_OUTPATIENT_CLINIC_OR_DEPARTMENT_OTHER)
Admission: RE | Admit: 2017-07-06 | Discharge: 2017-07-06 | Disposition: A | Discharge status: Home / Self Care | Payer: Medicare Other | Source: Ambulatory Visit | Attending: Family Medicine | Admitting: Family Medicine

## 2017-07-06 ENCOUNTER — Encounter: Payer: Self-pay | Admitting: Family Medicine

## 2017-07-06 VITALS — BP 148/79 | HR 101 | Temp 98.2°F | Resp 16 | Ht 67.0 in | Wt 268.4 lb

## 2017-07-06 DIAGNOSIS — W101XXA Fall (on)(from) sidewalk curb, initial encounter: Secondary | ICD-10-CM

## 2017-07-06 DIAGNOSIS — R918 Other nonspecific abnormal finding of lung field: Secondary | ICD-10-CM | POA: Insufficient documentation

## 2017-07-06 DIAGNOSIS — S43102A Unspecified dislocation of left acromioclavicular joint, initial encounter: Secondary | ICD-10-CM | POA: Diagnosis not present

## 2017-07-06 DIAGNOSIS — M545 Low back pain: Secondary | ICD-10-CM

## 2017-07-06 DIAGNOSIS — R0781 Pleurodynia: Secondary | ICD-10-CM | POA: Insufficient documentation

## 2017-07-06 DIAGNOSIS — S299XXA Unspecified injury of thorax, initial encounter: Secondary | ICD-10-CM | POA: Diagnosis not present

## 2017-07-06 DIAGNOSIS — G8929 Other chronic pain: Secondary | ICD-10-CM | POA: Diagnosis not present

## 2017-07-06 MED ORDER — OXYCODONE HCL 10 MG PO TABS: 5.0000 mg | ORAL_TABLET | ORAL | 0 refills | Status: DC | PRN

## 2017-07-06 MED ORDER — LANCET DEVICE MISC: 0 refills | Status: DC

## 2017-07-06 NOTE — Patient Instructions (Signed)
No fracture seen today- good news!  Try to take deep breaths several times a day to prevent chest congestion or possible pneumonia Let me know when you need refills of your medication, and we can plan to visit here in about 2 months.

## 2017-07-06 NOTE — Progress Notes (Addendum)
[place] at [facility] [address], [place], [place] [address] [phone]  Date:  07/06/2017   Name:  Justin Stone   DOB:  02-Jun-1961   MRN:  [id number]  PCP:  Darreld Mclean, MD    Chief Complaint: Fall (hurts on left side. fell saturday)   History of Present Illness:  Justin Stone is a 56 y.o. very pleasant male patient who presents with the following:  History of chronic pain, right BKA Last seen here in October Here today with concern of a recent fall. Today is Wednesday- on Saturday he tripped going up a curb and fell onto his LEFT side. Some friends had to help him stand up.   He feels like something is "very tight from time to time" in his left side and into his left lower back He did not hit her head- he reached out to catch himself and injured his left side and left arm   NCCSR:reviewed today and printed report He filled soma on 11/24- 30 day supply Oxycodone on 11/21- he got 70 pills which is an 11 day supply (he usually makes it last for a month, but has used more since he fell)  MSER on 11/21- 30 day supply  He will need a refill of his regular oxycodone in a few days- I will give him this rx today  He was drinking ginger ale last week due to GERD- he wonders if he could take any other medication for GERD as needed  He also needs me to write him an excuse for jury duty - he brings the form with him today  He did his contract and UDS in January of this year   Lab Results  Component Value Date   HGBA1C 4.5 (L) 04/18/2017   Pulse Readings from Last 3 Encounters:  07/06/17 (!) 101  05/09/17 100  04/18/17 100     Patient Active Problem List   Diagnosis Date Noted  . Insomnia 05/13/2016  . Umbilical hernia without obstruction and without gangrene 05/13/2016  . Spondylosis of lumbar region without myelopathy or radiculopathy 07/22/2015  . Obesity 06/29/2015  . 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 Type II diabetes mellitus, well controlled ([place]) 03/26/2015  . S/P BKA (below knee amputation) ([place]) 03/26/2015  . Alcoholic cirrhosis of liver without ascites ([place]) 02/22/2015  . Chronic hepatitis C without hepatic coma ([place]) 02/22/2015  . Chronic low back pain 12/29/2014  . Essential hypertension, benign 12/29/2014  . Hyperlipidemia LDL goal <100 12/29/2014    Past Medical History:  Diagnosis Date  . Allergy    eye allergies  . Anxiety   . Arthritis   . Diabetes mellitus without complication ([place])    takes oral meds now only  . GERD (gastroesophageal reflux disease)    past hx > 2 yrs ago  . Hepatitis    "Hepatitis C"- tx. with Harvoni- now testing negative.  . Hyperlipidemia   . Hypertension   . Neuromuscular disorder (HCC)    neuropathy hands/ feet.  . Osteopenia   . Prosthesis adjustment    right leg- below knee(weighs 10 lbs.)    Past Surgical History:  Procedure Laterality Date  . HERNIA REPAIR Left    LIH  . INSERTION OF MESH N/A 06/15/2016   Procedure: INSERTION OF MESH;  Surgeon: Greer Pickerel, MD;  Location: [facility];  Service: General;  Laterality: N/A;  .  right leg removal     for diabetes, wears prosthesis -Right Below knee since '15  . UMBILICAL HERNIA REPAIR N/A 06/15/2016   Procedure: LAPAROSCOPIC ASSISTED REPAIR OF  UMBILICAL HERNIA;  Surgeon: Greer Pickerel, MD;  Location: [facility];  Service: General;  Laterality: N/A;    Social History   Tobacco Use  . Smoking status: Former Smoker    Packs/day: 1.00    Years: 20.00    Pack years: 20.00    Types: Cigarettes    Last attempt to quit: 06/10/1986    Years since quitting: 31.0  . Smokeless tobacco: Never Used  Substance Use Topics  . Alcohol use: Yes    Comment: 1 quart every 3 days - 24 oz cans  . Drug use: No    Family History  Problem Relation Age of Onset  . Diabetes Maternal Uncle   . Diabetes Maternal Grandmother   . Colon cancer Neg Hx   . Colon polyps Neg  Hx   . Rectal cancer Neg Hx   . Stomach cancer Neg Hx   . Esophageal cancer Neg Hx     No Known Allergies  Medication list has been reviewed and updated.  Current Outpatient Medications on File Prior to Visit  Medication Sig Dispense Refill  . amLODipine (NORVASC) 10 MG tablet TAKE 1 TABLET(10 MG) BY MOUTH DAILY 90 tablet 3  . aspirin EC 81 MG tablet Take 81 mg by mouth daily.    Marland Kitchen azelastine (OPTIVAR) 0.05 % ophthalmic solution Place 1 drop into both eyes 2 (two) times daily. 6 mL 12  . blood glucose meter kit and supplies Dispense based on patient and insurance preference. Use up to four times daily as directed. (FOR ICD-9 250.00, 250.01). 1 each 0  . carisoprodol (SOMA) 350 MG tablet Take 1 tablet (350 mg total) by mouth 3 (three) times daily. 90 tablet 2  . ferrous sulfate 325 (65 FE) MG tablet Take 1 tablet (325 mg total) by mouth 2 (two) times daily with a meal. If constipation take just one a day 60 tablet 3  . furosemide (LASIX) 40 MG tablet TAKE 1 TABLET(40 MG) BY MOUTH DAILY 90 tablet 3  . gabapentin (NEURONTIN) 300 MG capsule Take 4 capsules (1,200 mg total) by mouth 3 (three) times daily. 360 capsule 5  . glucose blood (ONETOUCH VERIO) test strip Check blood sugar daily as directed. 100 each 3  . lisinopril (PRINIVIL,ZESTRIL) 10 MG tablet TAKE 1 TABLET BY MOUTH DAILY 90 tablet 0  . lisinopril (PRINIVIL,ZESTRIL) 20 MG tablet Take 1 tablet (20 mg total) by mouth daily. 90 tablet 0  . metFORMIN (GLUCOPHAGE) 500 MG tablet TAKE 2 TABLETS BY MOUTH IN THE MORNING AND 1 TABLET IN THE EVENING 270 tablet 2  . morphine (MS CONTIN) 15 MG 12 hr tablet Take 1 tablet (15 mg total) by mouth every 12 (twelve) hours. To fill 60 days after rx 60 tablet 0  . morphine (MS CONTIN) 15 MG 12 hr tablet Take 1 tablet (15 mg total) by mouth every 12 (twelve) hours. 60 tablet 0  . morphine (MS CONTIN) 15 MG 12 hr tablet Take 1 tablet (15 mg total) by mouth every 12 (twelve) hours. To fill in 30 days 60  tablet 0  . NARCAN 4 MG/0.1ML LIQD nasal spray kit CALL 911 AND U 1 SPR IN 1 NOS . REPEAT AFTRER 3 MIN IF NO OR MINIMAL RESPONSE  0  . ONETOUCH DELICA LANCETS 62G MISC USE TO TEST  BLOOD SUGAR TWICE DAILY 200 each 5  . Oxycodone HCl 10 MG TABS Take 0.5-1 tablets (5-10 mg total) by mouth every 4 (four) hours as needed. This is a 30 day supply.  To fill 60 days after rx 70 tablet 0  . Oxycodone HCl 10 MG TABS Take 0.5-1 tablets (5-10 mg total) by mouth every 4 (four) hours as needed. This is a 30 day supply. 70 tablet 0  . Oxycodone HCl 10 MG TABS Take 0.5-1 tablets (5-10 mg total) by mouth every 4 (four) hours as needed. This is a 30 day supply.  To fill 30 days after rx 70 tablet 0  . Polyethyl Glycol-Propyl Glycol (SYSTANE OP) Apply to eye.    . polyethylene glycol powder (GLYCOLAX/MIRALAX) powder Take 1 Container by mouth once.    . sildenafil (REVATIO) 20 MG tablet Take 1 tablet by mouth as directed 30 minutes before intercourse 50 tablet 0  . sildenafil (VIAGRA) 100 MG tablet Take 0.5-1 tablets (50-100 mg total) by mouth daily as needed for erectile dysfunction. 5 tablet 11  . simvastatin (ZOCOR) 10 MG tablet TAKE 1 TABLET BY MOUTH AT BEDTIME 90 tablet 3  . traZODone (DESYREL) 50 MG tablet Take 0.5 tablets (25 mg total) by mouth at bedtime as needed for sleep. 45 tablet 3  . venlafaxine (EFFEXOR) 75 MG tablet TAKE 1 TABLET(75 MG) BY MOUTH TWICE DAILY 180 tablet 3  . venlafaxine (EFFEXOR) 75 MG tablet TAKE 1 TABLET(75 MG) BY MOUTH TWICE DAILY 180 tablet 0   No current facility-administered medications on file prior to visit.     Review of Systems:  As per HPI- otherwise negative.   Physical Examination: Vitals:   07/06/17 1244 07/06/17 1251  BP: (!) 153/81 (!) 148/79  Pulse: (!) 108 (!) 101  Resp: 16   Temp: 98.2 F (36.8 C)   SpO2: 95%    Vitals:   07/06/17 1244  Weight: 268 lb 6.4 oz (121.7 kg)  Height: _0  (1.702 m)   Body mass index is 42.04 kg/m. Ideal Body Weight:  Weight in (lb) to have BMI = 25: 159.3  GEN: WDWN, NAD, Non-toxic, A & O x 3, obese, looks well today HEENT: Atraumatic, Normocephalic. Neck supple. No masses, No LAD. Ears and Nose: No external deformity. CV: RRR, No M/G/R. No JVD. No thrill. No extra heart sounds. PULM: CTA B, no wheezes, crackles, rhonchi. No retractions. No resp. distress. No accessory muscle use. ABD: S, NT, ND, +BS. No rebound. No HSM. EXTR: No c/c/e NEURO Normal gait.  PSYCH: Normally interactive. Conversant. Not depressed or anxious appearing.  Calm demeanor.  He indicates tenderness over the left lateal ribs where is where he fell He has a small abrasion on the left elbow, but normal ROM of the elbow and wrist, no suggestion of fracture    Dg Chest 2 View  Result Date: 07/06/2017 CLINICAL DATA:  Fall 4 days ago.  Sudden left-sided rib pain. EXAM: CHEST  2 VIEW COMPARISON:  One-view chest x-ray 02/11/2015 FINDINGS: The heart size is exaggerated by low lung volumes. dextroconvex scoliosis is present in the lower thoracic spine. Interstitial and airspace disease is present at the lung bases bilaterally. There is no acute or healing rib fracture. There is no pneumothorax. IMPRESSION: 1. Bilateral interstitial and airspace disease. While this may reflect atelectasis, infection is not excluded. 2. No acute or healing rib fracture. 3. Stable scoliosis. Electronically Signed   By: San Morelle M.D.   On: 07/06/2017 13:36  Dg Ribs Unilateral Left  Result Date: 07/06/2017 CLINICAL DATA:  Fall 12 days ago.  Left-sided pain since. EXAM: LEFT RIBS - 2 VIEW COMPARISON:  Two-view chest x-ray from the same day. One-view chest x-ray 02/11/2015. FINDINGS: No acute or healing left-sided rib fractures are present. There is no pneumothorax. Left hemithorax is clear. 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 IMPRESSION: 1. No acute or healing left rib fractures. 2. Chronic remote left AC  separation. Electronically Signed   By: San Morelle M.D.   On: 07/06/2017 13:35     Assessment and Plan: Fall (on)(from) sidewalk curb, initial encounter - Plan: DG Ribs Unilateral Left, DG Chest 2 View  Chronic low back pain without sciatica, unspecified back pain laterality - Plan: Oxycodone HCl 10 MG TABS  Here today with pain from a recent fall- however he does not appear to have sustained any fracture Refilled his oxycodone today - he uses this as needed for pain Discussed need to take deep breaths to keep lungs open  He will let me know if not steadily improving   Signed Lamar Blinks, MD  Called pt regarding x-ray report 12/14

## 2017-07-10 ENCOUNTER — Other Ambulatory Visit: Payer: Self-pay | Admitting: Family Medicine

## 2017-07-11 ENCOUNTER — Other Ambulatory Visit: Payer: Self-pay | Admitting: Family Medicine

## 2017-07-11 MED ORDER — DOXYCYCLINE HYCLATE 100 MG PO CAPS: 100.0000 mg | ORAL_CAPSULE | Freq: Two times a day (BID) | ORAL | 0 refills | Status: DC

## 2017-07-11 NOTE — Telephone Encounter (Signed)
Called- pt did not answer. LMOM- I will rx doxycycline for him to his pharmacy  Please let me know if sx persist after this treatment

## 2017-07-11 NOTE — Telephone Encounter (Signed)
Copied from CRM [id number]. Topic: Quick Communication - Rx Refill/Question >> Jul 11, 2017  3:45 PM Gerrianne ScalePayne, Trinidad Ingle L wrote: Has the patient contacted their pharmacy? No.   (Agent: If no, request that the patient contact the pharmacy for the refill.)   patient would like an antibiotic for bronchitis that Dr Patsy Lageropland told him that he has he is now experiencing lightheaded vomiting yellowish green mucus loss of appetite cough   Preferred Pharmacy (with phone number or street name): [facility] [phone] - [place], [place] - [address] AT [facility] [address] [phone] (Phone) [phone] (Fax)     Agent: Please be advised that RX refills may take up to 3 business days. We ask that you follow-up with your pharmacy.

## 2017-07-14 ENCOUNTER — Telehealth: Payer: Self-pay | Admitting: Family Medicine

## 2017-07-14 DIAGNOSIS — M545 Low back pain: Principal | ICD-10-CM

## 2017-07-14 DIAGNOSIS — G8929 Other chronic pain: Secondary | ICD-10-CM

## 2017-07-14 MED ORDER — MORPHINE SULFATE ER 15 MG PO TBCR: 15.0000 mg | EXTENDED_RELEASE_TABLET | Freq: Two times a day (BID) | ORAL | 0 refills | Status: DC

## 2017-07-14 MED ORDER — CARISOPRODOL 350 MG PO TABS: 350.0000 mg | ORAL_TABLET | Freq: Three times a day (TID) | ORAL | 2 refills | Status: DC

## 2017-07-14 NOTE — Telephone Encounter (Signed)
I refilled the MS contin, and soma, but not yet time for oxycodone  Meds ordered this encounter  Medications  . morphine (MS CONTIN) 15 MG 12 hr tablet    Sig: Take 1 tablet (15 mg total) by mouth every 12 (twelve) hours.    Dispense:  60 tablet    Refill:  0  . carisoprodol (SOMA) 350 MG tablet    Sig: Take 1 tablet (350 mg total) by mouth 3 (three) times daily.    Dispense:  90 tablet    Refill:  2  '

## 2017-07-14 NOTE — Telephone Encounter (Signed)
Copied from CRM [id number]. Topic: Quick Communication - Rx Refill/Question >> Jul 14, 2017 12:37 PM Oneal GroutSebastian, Jennifer S wrote: Has the patient contacted their pharmacy? Yes.  Controlled substance   (Agent: If no, request that the patient contact the pharmacy for the refill.)   Preferred Pharmacy (with phone number or street name):    Agent: Please be advised that RX refills may take up to 3 business days. We ask that you follow-up with your pharmacy. Requesting refill on morphine (MS CONTIN) 15 MG 12 hr tablet, Oxycodone HCl 10 MG TABS and carisoprodol (SOMA) 350 MG tablet

## 2017-07-14 NOTE — Telephone Encounter (Signed)
Please advise 

## 2017-07-20 ENCOUNTER — Ambulatory Visit: Payer: Self-pay | Admitting: *Deleted

## 2017-07-20 NOTE — Telephone Encounter (Signed)
Pt called with complaints of left leg swelling knee to foot; pt also complains of pain 9 out of 10 when he stands, he feels like "there is a big ball in his foot"; he states that this pain has been going on for a long time; pt states that his leg "stink like..."; nurse triage initiated; per protocol pt offered and accepted appointment with Dr Patsy Lageropland at 1045 on 07/21/17; pt verbalizes understanding; [facility] of this upcoming [facility].  Reason for Disposition . [1] MODERATE leg swelling (e.g., swelling extends up to knees) AND [2] new onset or worsening  Protocols used: LEG SWELLING AND EDEMA-A-AH

## 2017-07-20 NOTE — Telephone Encounter (Signed)
FYI

## 2017-07-21 ENCOUNTER — Encounter (HOSPITAL_BASED_OUTPATIENT_CLINIC_OR_DEPARTMENT_OTHER): Payer: Self-pay | Admitting: *Deleted

## 2017-07-21 ENCOUNTER — Encounter (HOSPITAL_COMMUNITY)
Admission: EM | Disposition: A | Discharge status: Rehabilitation Facility | Payer: Self-pay | Source: Home / Self Care | Attending: Family Medicine

## 2017-07-21 ENCOUNTER — Emergency Department (HOSPITAL_COMMUNITY): Payer: Medicare Other | Admitting: Registered Nurse

## 2017-07-21 ENCOUNTER — Inpatient Hospital Stay (HOSPITAL_BASED_OUTPATIENT_CLINIC_OR_DEPARTMENT_OTHER)
Admission: EM | Admit: 2017-07-21 | Discharge: 2017-07-25 | DRG: 240 | Disposition: A | Discharge status: Rehabilitation Facility | Payer: Medicare Other | Attending: Family Medicine | Admitting: Family Medicine

## 2017-07-21 ENCOUNTER — Emergency Department (HOSPITAL_BASED_OUTPATIENT_CLINIC_OR_DEPARTMENT_OTHER): Payer: Medicare Other

## 2017-07-21 ENCOUNTER — Other Ambulatory Visit: Payer: Self-pay

## 2017-07-21 ENCOUNTER — Encounter: Payer: Self-pay | Admitting: Family Medicine

## 2017-07-21 ENCOUNTER — Ambulatory Visit (INDEPENDENT_AMBULATORY_CARE_PROVIDER_SITE_OTHER): Payer: Medicare Other | Admitting: Family Medicine

## 2017-07-21 VITALS — BP 115/51 | HR 107 | Temp 98.1°F | Resp 16 | Ht 68.0 in | Wt 250.8 lb

## 2017-07-21 DIAGNOSIS — D62 Acute posthemorrhagic anemia: Secondary | ICD-10-CM

## 2017-07-21 DIAGNOSIS — R195 Other fecal abnormalities: Secondary | ICD-10-CM | POA: Diagnosis not present

## 2017-07-21 DIAGNOSIS — S88112A Complete traumatic amputation at level between knee and ankle, left lower leg, initial encounter: Secondary | ICD-10-CM | POA: Diagnosis present

## 2017-07-21 DIAGNOSIS — R000 Tachycardia, unspecified: Secondary | ICD-10-CM

## 2017-07-21 DIAGNOSIS — Z7984 Long term (current) use of oral hypoglycemic drugs: Secondary | ICD-10-CM

## 2017-07-21 DIAGNOSIS — I70262 Atherosclerosis of native arteries of extremities with gangrene, left leg: Secondary | ICD-10-CM | POA: Diagnosis not present

## 2017-07-21 DIAGNOSIS — F1096 Alcohol use, unspecified with alcohol-induced persisting amnestic disorder: Secondary | ICD-10-CM | POA: Diagnosis not present

## 2017-07-21 DIAGNOSIS — Z4781 Encounter for orthopedic aftercare following surgical amputation: Secondary | ICD-10-CM | POA: Diagnosis not present

## 2017-07-21 DIAGNOSIS — G894 Chronic pain syndrome: Secondary | ICD-10-CM

## 2017-07-21 DIAGNOSIS — E871 Hypo-osmolality and hyponatremia: Secondary | ICD-10-CM

## 2017-07-21 DIAGNOSIS — E11621 Type 2 diabetes mellitus with foot ulcer: Secondary | ICD-10-CM | POA: Diagnosis not present

## 2017-07-21 DIAGNOSIS — E1152 Type 2 diabetes mellitus with diabetic peripheral angiopathy with gangrene: Secondary | ICD-10-CM

## 2017-07-21 DIAGNOSIS — Z833 Family history of diabetes mellitus: Secondary | ICD-10-CM

## 2017-07-21 DIAGNOSIS — G9340 Encephalopathy, unspecified: Secondary | ICD-10-CM | POA: Diagnosis not present

## 2017-07-21 DIAGNOSIS — F04 Amnestic disorder due to known physiological condition: Secondary | ICD-10-CM | POA: Diagnosis not present

## 2017-07-21 DIAGNOSIS — M86172 Other acute osteomyelitis, left ankle and foot: Secondary | ICD-10-CM | POA: Diagnosis not present

## 2017-07-21 DIAGNOSIS — K5900 Constipation, unspecified: Secondary | ICD-10-CM | POA: Diagnosis not present

## 2017-07-21 DIAGNOSIS — S88112S Complete traumatic amputation at level between knee and ankle, left lower leg, sequela: Secondary | ICD-10-CM | POA: Diagnosis not present

## 2017-07-21 DIAGNOSIS — E1142 Type 2 diabetes mellitus with diabetic polyneuropathy: Secondary | ICD-10-CM | POA: Diagnosis not present

## 2017-07-21 DIAGNOSIS — E785 Hyperlipidemia, unspecified: Secondary | ICD-10-CM | POA: Diagnosis not present

## 2017-07-21 DIAGNOSIS — L97929 Non-pressure chronic ulcer of unspecified part of left lower leg with unspecified severity: Secondary | ICD-10-CM | POA: Diagnosis not present

## 2017-07-21 DIAGNOSIS — E1169 Type 2 diabetes mellitus with other specified complication: Secondary | ICD-10-CM | POA: Diagnosis present

## 2017-07-21 DIAGNOSIS — M7989 Other specified soft tissue disorders: Secondary | ICD-10-CM | POA: Diagnosis not present

## 2017-07-21 DIAGNOSIS — E119 Type 2 diabetes mellitus without complications: Secondary | ICD-10-CM | POA: Diagnosis not present

## 2017-07-21 DIAGNOSIS — R140 Abdominal distension (gaseous): Secondary | ICD-10-CM | POA: Diagnosis not present

## 2017-07-21 DIAGNOSIS — N179 Acute kidney failure, unspecified: Secondary | ICD-10-CM

## 2017-07-21 DIAGNOSIS — Z87891 Personal history of nicotine dependence: Secondary | ICD-10-CM | POA: Diagnosis not present

## 2017-07-21 DIAGNOSIS — E512 Wernicke's encephalopathy: Secondary | ICD-10-CM | POA: Diagnosis not present

## 2017-07-21 DIAGNOSIS — E869 Volume depletion, unspecified: Secondary | ICD-10-CM | POA: Diagnosis present

## 2017-07-21 DIAGNOSIS — Z7982 Long term (current) use of aspirin: Secondary | ICD-10-CM | POA: Diagnosis not present

## 2017-07-21 DIAGNOSIS — K567 Ileus, unspecified: Secondary | ICD-10-CM | POA: Diagnosis not present

## 2017-07-21 DIAGNOSIS — G8918 Other acute postprocedural pain: Secondary | ICD-10-CM | POA: Diagnosis not present

## 2017-07-21 DIAGNOSIS — Z89519 Acquired absence of unspecified leg below knee: Secondary | ICD-10-CM | POA: Diagnosis not present

## 2017-07-21 DIAGNOSIS — W19XXXA Unspecified fall, initial encounter: Secondary | ICD-10-CM | POA: Diagnosis present

## 2017-07-21 DIAGNOSIS — Z89511 Acquired absence of right leg below knee: Secondary | ICD-10-CM

## 2017-07-21 DIAGNOSIS — R410 Disorientation, unspecified: Secondary | ICD-10-CM | POA: Diagnosis not present

## 2017-07-21 DIAGNOSIS — M869 Osteomyelitis, unspecified: Secondary | ICD-10-CM | POA: Diagnosis not present

## 2017-07-21 DIAGNOSIS — Z794 Long term (current) use of insulin: Secondary | ICD-10-CM | POA: Diagnosis not present

## 2017-07-21 DIAGNOSIS — I96 Gangrene, not elsewhere classified: Secondary | ICD-10-CM | POA: Diagnosis not present

## 2017-07-21 DIAGNOSIS — L089 Local infection of the skin and subcutaneous tissue, unspecified: Secondary | ICD-10-CM | POA: Diagnosis not present

## 2017-07-21 DIAGNOSIS — I10 Essential (primary) hypertension: Secondary | ICD-10-CM | POA: Diagnosis not present

## 2017-07-21 DIAGNOSIS — R413 Other amnesia: Secondary | ICD-10-CM | POA: Diagnosis not present

## 2017-07-21 DIAGNOSIS — E1159 Type 2 diabetes mellitus with other circulatory complications: Secondary | ICD-10-CM | POA: Diagnosis not present

## 2017-07-21 DIAGNOSIS — E876 Hypokalemia: Secondary | ICD-10-CM | POA: Diagnosis not present

## 2017-07-21 DIAGNOSIS — Z79899 Other long term (current) drug therapy: Secondary | ICD-10-CM

## 2017-07-21 DIAGNOSIS — D72829 Elevated white blood cell count, unspecified: Secondary | ICD-10-CM | POA: Diagnosis not present

## 2017-07-21 DIAGNOSIS — K9189 Other postprocedural complications and disorders of digestive system: Secondary | ICD-10-CM | POA: Diagnosis not present

## 2017-07-21 DIAGNOSIS — W19XXXS Unspecified fall, sequela: Secondary | ICD-10-CM | POA: Diagnosis not present

## 2017-07-21 DIAGNOSIS — G546 Phantom limb syndrome with pain: Secondary | ICD-10-CM | POA: Diagnosis not present

## 2017-07-21 DIAGNOSIS — Z89512 Acquired absence of left leg below knee: Secondary | ICD-10-CM | POA: Diagnosis not present

## 2017-07-21 DIAGNOSIS — R7989 Other specified abnormal findings of blood chemistry: Secondary | ICD-10-CM | POA: Diagnosis not present

## 2017-07-21 HISTORY — PX: AMPUTATION: SHX166

## 2017-07-21 LAB — BASIC METABOLIC PANEL
Anion gap: 13 (ref 5–15)
BUN: 33 mg/dL — ABNORMAL HIGH (ref 6–20)
CO2: 19 mmol/L — ABNORMAL LOW (ref 22–32)
Calcium: 9.4 mg/dL (ref 8.9–10.3)
Chloride: 96 mmol/L — ABNORMAL LOW (ref 101–111)
Creatinine, Ser: 1.55 mg/dL — ABNORMAL HIGH (ref 0.61–1.24)
GFR calc Af Amer: 57 mL/min — ABNORMAL LOW (ref >60)
GFR calc non Af Amer: 49 mL/min — ABNORMAL LOW (ref >60)
Glucose, Bld: 165 mg/dL — ABNORMAL HIGH (ref 65–99)
Potassium: 3.9 mmol/L (ref 3.5–5.1)
Sodium: 128 mmol/L — ABNORMAL LOW (ref 135–145)

## 2017-07-21 LAB — CBC WITH DIFFERENTIAL/PLATELET
Basophils Absolute: 0 10*3/uL (ref 0.0–0.1)
Basophils Relative: 0 %
EOS ABS: 0.2 10*3/uL (ref 0.0–0.7)
Eosinophils Relative: 1 %
HEMATOCRIT: 32.1 % — AB (ref 39.0–52.0)
HEMOGLOBIN: 10.5 g/dL — AB (ref 13.0–17.0)
LYMPHS ABS: 1.2 10*3/uL (ref 0.7–4.0)
LYMPHS PCT: 7 %
MCH: 31.3 pg (ref 26.0–34.0)
MCHC: 32.7 g/dL (ref 30.0–36.0)
MCV: 95.5 fL (ref 78.0–100.0)
MONOS PCT: 11 %
Monocytes Absolute: 1.9 10*3/uL — ABNORMAL HIGH (ref 0.1–1.0)
NEUTROS ABS: 14.4 10*3/uL — AB (ref 1.7–7.7)
NEUTROS PCT: 81 %
Platelets: 264 10*3/uL (ref 150–400)
RBC: 3.36 MIL/uL — AB (ref 4.22–5.81)
RDW: 14 % (ref 11.5–15.5)
WBC: 17.7 10*3/uL — ABNORMAL HIGH (ref 4.0–10.5)

## 2017-07-21 LAB — CBG MONITORING, ED: GLUCOSE-CAPILLARY: 163 mg/dL — AB (ref 65–99)

## 2017-07-21 LAB — GLUCOSE, CAPILLARY
GLUCOSE-CAPILLARY: 156 mg/dL — AB (ref 65–99)
Glucose-Capillary: 146 mg/dL — ABNORMAL HIGH (ref 65–99)

## 2017-07-21 SURGERY — AMPUTATION, FOOT, RAY
Anesthesia: General | Site: Leg Lower | Laterality: Left

## 2017-07-21 MED ORDER — METHOCARBAMOL 500 MG PO TABS
500.0000 mg | ORAL_TABLET | Freq: Four times a day (QID) | ORAL | Status: DC | PRN
  Administered 2017-07-22 – 2017-07-25 (×3): 500 mg via ORAL
  Filled 2017-07-21 (×3): qty 1

## 2017-07-21 MED ORDER — METFORMIN HCL 500 MG PO TABS: 1000.0000 mg | ORAL_TABLET | Freq: Every day | ORAL | Status: DC

## 2017-07-21 MED ORDER — BISACODYL 10 MG RE SUPP
10.0000 mg | Freq: Every day | RECTAL | Status: DC | PRN
  Filled 2017-07-21: qty 1

## 2017-07-21 MED ORDER — ENOXAPARIN SODIUM 40 MG/0.4ML ~~LOC~~ SOLN
40.0000 mg | SUBCUTANEOUS | Status: DC
  Administered 2017-07-22 – 2017-07-25 (×4): 40 mg via SUBCUTANEOUS
  Filled 2017-07-21 (×4): qty 0.4

## 2017-07-21 MED ORDER — FLEET ENEMA 7-19 GM/118ML RE ENEM: 1.0000 | ENEMA | Freq: Once | RECTAL | Status: DC | PRN

## 2017-07-21 MED ORDER — INSULIN ASPART 100 UNIT/ML ~~LOC~~ SOLN
0.0000 [IU] | Freq: Three times a day (TID) | SUBCUTANEOUS | Status: DC
  Administered 2017-07-22: 2 [IU] via SUBCUTANEOUS
  Administered 2017-07-22 (×2): 3 [IU] via SUBCUTANEOUS
  Administered 2017-07-23 (×3): 2 [IU] via SUBCUTANEOUS
  Administered 2017-07-24: 13:00:00 3 [IU] via SUBCUTANEOUS
  Administered 2017-07-24 (×2): 2 [IU] via SUBCUTANEOUS
  Administered 2017-07-25 (×2): 3 [IU] via SUBCUTANEOUS

## 2017-07-21 MED ORDER — LISINOPRIL 20 MG PO TABS: 20.0000 mg | ORAL_TABLET | Freq: Every day | ORAL | Status: DC

## 2017-07-21 MED ORDER — ONDANSETRON HCL 4 MG/2ML IJ SOLN
INTRAMUSCULAR | Status: DC | PRN
  Administered 2017-07-21: 4 mg via INTRAVENOUS

## 2017-07-21 MED ORDER — CEFAZOLIN SODIUM-DEXTROSE 2-3 GM-%(50ML) IV SOLR
INTRAVENOUS | Status: DC | PRN
  Administered 2017-07-21: 2 g via INTRAVENOUS

## 2017-07-21 MED ORDER — MIDAZOLAM HCL 2 MG/2ML IJ SOLN
INTRAMUSCULAR | Status: AC
  Filled 2017-07-21: qty 2

## 2017-07-21 MED ORDER — FENTANYL CITRATE (PF) 100 MCG/2ML IJ SOLN
INTRAMUSCULAR | Status: AC
  Filled 2017-07-21: qty 2

## 2017-07-21 MED ORDER — IPRATROPIUM-ALBUTEROL 0.5-2.5 (3) MG/3ML IN SOLN: 3.0000 mL | Freq: Once | RESPIRATORY_TRACT | Status: DC

## 2017-07-21 MED ORDER — MEPERIDINE HCL 50 MG/ML IJ SOLN: 6.2500 mg | INTRAMUSCULAR | Status: DC | PRN

## 2017-07-21 MED ORDER — ACETAMINOPHEN 650 MG RE SUPP: 650.0000 mg | RECTAL | Status: DC | PRN

## 2017-07-21 MED ORDER — CARISOPRODOL 350 MG PO TABS
350.0000 mg | ORAL_TABLET | Freq: Three times a day (TID) | ORAL | Status: DC
  Administered 2017-07-21 – 2017-07-25 (×11): 350 mg via ORAL
  Filled 2017-07-21 (×11): qty 1

## 2017-07-21 MED ORDER — ACETAMINOPHEN 325 MG PO TABS: 650.0000 mg | ORAL_TABLET | ORAL | Status: DC | PRN

## 2017-07-21 MED ORDER — MORPHINE SULFATE ER 15 MG PO TBCR
15.0000 mg | EXTENDED_RELEASE_TABLET | Freq: Two times a day (BID) | ORAL | Status: DC
  Administered 2017-07-21 – 2017-07-25 (×8): 15 mg via ORAL
  Filled 2017-07-21 (×8): qty 1

## 2017-07-21 MED ORDER — SODIUM CHLORIDE 0.9 % IV SOLN
INTRAVENOUS | Status: DC
  Administered 2017-07-21: 13:00:00 via INTRAVENOUS

## 2017-07-21 MED ORDER — AMLODIPINE BESYLATE 10 MG PO TABS
10.0000 mg | ORAL_TABLET | Freq: Every day | ORAL | Status: DC
  Administered 2017-07-22 – 2017-07-25 (×4): 10 mg via ORAL
  Filled 2017-07-21 (×4): qty 1

## 2017-07-21 MED ORDER — TRAZODONE 25 MG HALF TABLET
25.0000 mg | ORAL_TABLET | Freq: Every evening | ORAL | Status: DC | PRN
Start: 2017-07-21 — End: 2017-07-25
  Administered 2017-07-22: 21:00:00 25 mg via ORAL
  Filled 2017-07-21: qty 1

## 2017-07-21 MED ORDER — PROPOFOL 10 MG/ML IV BOLUS
INTRAVENOUS | Status: AC
  Filled 2017-07-21: qty 60

## 2017-07-21 MED ORDER — FENTANYL CITRATE (PF) 100 MCG/2ML IJ SOLN
INTRAMUSCULAR | Status: DC | PRN
Start: 2017-07-21 — End: 2017-07-21
  Administered 2017-07-21 (×3): 50 ug via INTRAVENOUS
  Administered 2017-07-21: 100 ug via INTRAVENOUS

## 2017-07-21 MED ORDER — METOCLOPRAMIDE HCL 5 MG/ML IJ SOLN: 10.0000 mg | Freq: Once | INTRAMUSCULAR | Status: DC | PRN

## 2017-07-21 MED ORDER — FERROUS SULFATE 325 (65 FE) MG PO TABS
325.0000 mg | ORAL_TABLET | Freq: Two times a day (BID) | ORAL | Status: DC
  Administered 2017-07-22 – 2017-07-25 (×7): 325 mg via ORAL
  Filled 2017-07-21 (×7): qty 1

## 2017-07-21 MED ORDER — SIMVASTATIN 10 MG PO TABS
10.0000 mg | ORAL_TABLET | Freq: Every day | ORAL | Status: DC
  Administered 2017-07-21 – 2017-07-24 (×3): 10 mg via ORAL
  Filled 2017-07-21 (×4): qty 1

## 2017-07-21 MED ORDER — VANCOMYCIN HCL 500 MG IV SOLR
INTRAVENOUS | Status: AC
  Filled 2017-07-21: qty 2500

## 2017-07-21 MED ORDER — DEXAMETHASONE SODIUM PHOSPHATE 10 MG/ML IJ SOLN
INTRAMUSCULAR | Status: AC
  Filled 2017-07-21: qty 1

## 2017-07-21 MED ORDER — GABAPENTIN 300 MG PO CAPS: 1200.0000 mg | ORAL_CAPSULE | Freq: Once | ORAL | Status: DC

## 2017-07-21 MED ORDER — HYDROMORPHONE HCL 1 MG/ML IJ SOLN
1.0000 mg | Freq: Once | INTRAMUSCULAR | Status: AC
  Administered 2017-07-21: 1 mg via INTRAVENOUS
  Filled 2017-07-21: qty 1

## 2017-07-21 MED ORDER — ASPIRIN EC 81 MG PO TBEC
81.0000 mg | DELAYED_RELEASE_TABLET | Freq: Every day | ORAL | Status: DC
  Administered 2017-07-21 – 2017-07-25 (×5): 81 mg via ORAL
  Filled 2017-07-21 (×4): qty 1

## 2017-07-21 MED ORDER — ONDANSETRON HCL 4 MG/2ML IJ SOLN
INTRAMUSCULAR | Status: AC
  Filled 2017-07-21: qty 2

## 2017-07-21 MED ORDER — VANCOMYCIN HCL IN DEXTROSE 750-5 MG/150ML-% IV SOLN
750.0000 mg | Freq: Two times a day (BID) | INTRAVENOUS | Status: DC
  Filled 2017-07-21: qty 150

## 2017-07-21 MED ORDER — OXYCODONE HCL 5 MG PO TABS
5.0000 mg | ORAL_TABLET | ORAL | Status: DC | PRN
  Filled 2017-07-21: qty 1

## 2017-07-21 MED ORDER — HYDROMORPHONE HCL 1 MG/ML IJ SOLN: 1.0000 mg | INTRAMUSCULAR | Status: DC | PRN

## 2017-07-21 MED ORDER — PHENYLEPHRINE HCL 10 MG/ML IJ SOLN
INTRAMUSCULAR | Status: DC | PRN
  Administered 2017-07-21 (×2): 80 ug via INTRAVENOUS

## 2017-07-21 MED ORDER — SENNA 8.6 MG PO TABS
1.0000 | ORAL_TABLET | Freq: Two times a day (BID) | ORAL | Status: DC
  Administered 2017-07-21 – 2017-07-25 (×6): 8.6 mg via ORAL
  Filled 2017-07-21 (×8): qty 1

## 2017-07-21 MED ORDER — DEXTROSE 5 % IV SOLN
2.0000 g | Freq: Once | INTRAVENOUS | Status: AC
  Administered 2017-07-21: 2 g via INTRAVENOUS
  Filled 2017-07-21: qty 2

## 2017-07-21 MED ORDER — GABAPENTIN 300 MG PO CAPS
400.0000 mg | ORAL_CAPSULE | Freq: Once | ORAL | Status: AC
  Administered 2017-07-21: 400 mg via ORAL
  Filled 2017-07-21: qty 1

## 2017-07-21 MED ORDER — KETOTIFEN FUMARATE 0.025 % OP SOLN
1.0000 [drp] | Freq: Two times a day (BID) | OPHTHALMIC | Status: DC
  Administered 2017-07-21 – 2017-07-25 (×8): 1 [drp] via OPHTHALMIC
  Filled 2017-07-21: qty 5

## 2017-07-21 MED ORDER — OXYCODONE HCL 5 MG PO TABS
10.0000 mg | ORAL_TABLET | ORAL | Status: DC | PRN
  Administered 2017-07-21 – 2017-07-25 (×15): 10 mg via ORAL
  Filled 2017-07-21 (×15): qty 2

## 2017-07-21 MED ORDER — PROPOFOL 10 MG/ML IV BOLUS
INTRAVENOUS | Status: DC | PRN
  Administered 2017-07-21: 200 mg via INTRAVENOUS

## 2017-07-21 MED ORDER — DEXTROSE 5 % IV SOLN
1.0000 g | Freq: Once | INTRAVENOUS | Status: DC
  Filled 2017-07-21: qty 10

## 2017-07-21 MED ORDER — METHOCARBAMOL 1000 MG/10ML IJ SOLN
500.0000 mg | Freq: Four times a day (QID) | INTRAVENOUS | Status: DC | PRN
  Administered 2017-07-21: 500 mg via INTRAVENOUS
  Filled 2017-07-21: qty 550

## 2017-07-21 MED ORDER — MIDAZOLAM HCL 5 MG/5ML IJ SOLN
INTRAMUSCULAR | Status: DC | PRN
  Administered 2017-07-21: 2 mg via INTRAVENOUS

## 2017-07-21 MED ORDER — SODIUM CHLORIDE 0.9 % IV BOLUS (SEPSIS)
500.0000 mL | Freq: Once | INTRAVENOUS | Status: AC
  Administered 2017-07-21: 500 mL via INTRAVENOUS

## 2017-07-21 MED ORDER — METOCLOPRAMIDE HCL 5 MG/ML IJ SOLN: 5.0000 mg | Freq: Three times a day (TID) | INTRAMUSCULAR | Status: DC | PRN

## 2017-07-21 MED ORDER — DEXTROSE 5 % IV SOLN
2.0000 g | Freq: Once | INTRAVENOUS | Status: AC
  Administered 2017-07-22: 13:00:00 2 g via INTRAVENOUS
  Filled 2017-07-21: qty 2

## 2017-07-21 MED ORDER — VANCOMYCIN HCL 10 G IV SOLR
2500.0000 mg | Freq: Once | INTRAVENOUS | Status: AC
  Administered 2017-07-21: 2500 mg via INTRAVENOUS
  Filled 2017-07-21: qty 2500

## 2017-07-21 MED ORDER — LACTATED RINGERS IV SOLN: INTRAVENOUS | Status: DC

## 2017-07-21 MED ORDER — GABAPENTIN 300 MG PO CAPS
1200.0000 mg | ORAL_CAPSULE | Freq: Three times a day (TID) | ORAL | Status: DC
  Administered 2017-07-21 – 2017-07-25 (×11): 1200 mg via ORAL
  Filled 2017-07-21 (×11): qty 4

## 2017-07-21 MED ORDER — FENTANYL CITRATE (PF) 250 MCG/5ML IJ SOLN
INTRAMUSCULAR | Status: AC
  Filled 2017-07-21: qty 5

## 2017-07-21 MED ORDER — VANCOMYCIN HCL IN DEXTROSE 1-5 GM/200ML-% IV SOLN
1000.0000 mg | Freq: Two times a day (BID) | INTRAVENOUS | Status: AC
  Administered 2017-07-22: 1000 mg via INTRAVENOUS
  Filled 2017-07-21: qty 200

## 2017-07-21 MED ORDER — POLYETHYLENE GLYCOL 3350 17 G PO PACK: 17.0000 g | PACK | Freq: Every day | ORAL | Status: DC | PRN

## 2017-07-21 MED ORDER — SODIUM CHLORIDE 0.9 % IV SOLN: INTRAVENOUS | Status: DC

## 2017-07-21 MED ORDER — VENLAFAXINE HCL 75 MG PO TABS
75.0000 mg | ORAL_TABLET | Freq: Two times a day (BID) | ORAL | Status: DC
  Administered 2017-07-22 – 2017-07-25 (×7): 75 mg via ORAL
  Filled 2017-07-21 (×8): qty 1

## 2017-07-21 MED ORDER — FENTANYL CITRATE (PF) 100 MCG/2ML IJ SOLN
INTRAMUSCULAR | Status: AC
  Filled 2017-07-21: qty 4

## 2017-07-21 MED ORDER — PHENYLEPHRINE 40 MCG/ML (10ML) SYRINGE FOR IV PUSH (FOR BLOOD PRESSURE SUPPORT)
PREFILLED_SYRINGE | INTRAVENOUS | Status: AC
  Filled 2017-07-21: qty 10

## 2017-07-21 MED ORDER — FENTANYL CITRATE (PF) 100 MCG/2ML IJ SOLN
25.0000 ug | INTRAMUSCULAR | Status: DC | PRN
  Administered 2017-07-21 (×3): 50 ug via INTRAVENOUS

## 2017-07-21 MED ORDER — ONDANSETRON HCL 4 MG/2ML IJ SOLN
4.0000 mg | Freq: Four times a day (QID) | INTRAMUSCULAR | Status: DC | PRN
Start: 2017-07-21 — End: 2017-07-25

## 2017-07-21 MED ORDER — LACTATED RINGERS IV SOLN
INTRAVENOUS | Status: DC | PRN
  Administered 2017-07-21: 18:00:00 via INTRAVENOUS

## 2017-07-21 MED ORDER — ONDANSETRON HCL 4 MG PO TABS: 4.0000 mg | ORAL_TABLET | Freq: Four times a day (QID) | ORAL | Status: DC | PRN

## 2017-07-21 MED ORDER — HYDROMORPHONE HCL 1 MG/ML IJ SOLN
1.5000 mg | Freq: Once | INTRAMUSCULAR | Status: AC
  Administered 2017-07-21: 1.5 mg via INTRAVENOUS
  Filled 2017-07-21: qty 2

## 2017-07-21 MED ORDER — CEFAZOLIN SODIUM-DEXTROSE 2-4 GM/100ML-% IV SOLN
INTRAVENOUS | Status: AC
  Filled 2017-07-21: qty 100

## 2017-07-21 MED ORDER — DOCUSATE SODIUM 100 MG PO CAPS
100.0000 mg | ORAL_CAPSULE | Freq: Two times a day (BID) | ORAL | Status: DC
  Administered 2017-07-21 – 2017-07-25 (×7): 100 mg via ORAL
  Filled 2017-07-21 (×8): qty 1

## 2017-07-21 MED ORDER — METOCLOPRAMIDE HCL 5 MG PO TABS: 5.0000 mg | ORAL_TABLET | Freq: Three times a day (TID) | ORAL | Status: DC | PRN

## 2017-07-21 SURGICAL SUPPLY — 40 items
BAG ZIPLOCK 12X15 (MISCELLANEOUS) ×3 IMPLANT
BANDAGE ACE 6X5 VEL STRL LF (GAUZE/BANDAGES/DRESSINGS) ×3 IMPLANT
BLADE OSCILLATING/SAGITTAL (BLADE)
BLADE RECIP (BLADE) ×3 IMPLANT
BLADE SW THK.38XMED LNG THN (BLADE) IMPLANT
BNDG CONFORM 3 STRL LF (GAUZE/BANDAGES/DRESSINGS) ×3 IMPLANT
BNDG ELASTIC 6X15 VLCR STRL LF (GAUZE/BANDAGES/DRESSINGS) ×3 IMPLANT
BNDG ESMARK 4X9 LF (GAUZE/BANDAGES/DRESSINGS) ×3 IMPLANT
COVER SURGICAL LIGHT HANDLE (MISCELLANEOUS) ×3 IMPLANT
DRAPE SHEET LG 3/4 BI-LAMINATE (DRAPES) ×3 IMPLANT
DRAPE SURG 17X11 SM STRL (DRAPES) ×6 IMPLANT
DRAPE U-SHAPE 47X51 STRL (DRAPES) ×6 IMPLANT
DRSG ADAPTIC 3X8 NADH LF (GAUZE/BANDAGES/DRESSINGS) ×3 IMPLANT
DRSG MEPITEL 8X12 (GAUZE/BANDAGES/DRESSINGS) ×2 IMPLANT
ELECT REM PT RETURN 15FT ADLT (MISCELLANEOUS) ×3 IMPLANT
GAUZE SPONGE 4X4 12PLY STRL (GAUZE/BANDAGES/DRESSINGS) ×3 IMPLANT
GLOVE BIO SURGEON STRL SZ8 (GLOVE) ×3 IMPLANT
GLOVE BIOGEL PI IND STRL 8 (GLOVE) IMPLANT
GLOVE BIOGEL PI INDICATOR 8 (GLOVE)
GOWN STRL REUS W/ TWL XL LVL3 (GOWN DISPOSABLE) ×1 IMPLANT
GOWN STRL REUS W/TWL XL LVL3 (GOWN DISPOSABLE) ×2
KIT BASIN OR (CUSTOM PROCEDURE TRAY) ×3 IMPLANT
MEPITEL 8X12 (GAUZE/BANDAGES/DRESSINGS) ×1
NS IRRIG 1000ML POUR BTL (IV SOLUTION) ×3 IMPLANT
PACK TOTAL JOINT (CUSTOM PROCEDURE TRAY) ×3 IMPLANT
PAD ABD 7.5X8 STRL (GAUZE/BANDAGES/DRESSINGS) ×3 IMPLANT
PAD CAST 4YDX4 CTTN HI CHSV (CAST SUPPLIES) ×2 IMPLANT
PADDING CAST ABS 3INX4YD NS (CAST SUPPLIES) ×2
PADDING CAST ABS COTTON 3X4 (CAST SUPPLIES) ×1 IMPLANT
PADDING CAST COTTON 4X4 STRL (CAST SUPPLIES) ×4
PADDING CAST COTTON 6X4 STRL (CAST SUPPLIES) ×3 IMPLANT
POSITIONER SURGICAL ARM (MISCELLANEOUS) ×6 IMPLANT
STOCKINETTE 8 INCH (MISCELLANEOUS) ×3 IMPLANT
SUT ETHILON 2 0 PS N (SUTURE) ×6 IMPLANT
SUT ETHILON 2 0 PSLX (SUTURE) IMPLANT
SUT MNCRL AB 3-0 PS2 18 (SUTURE) ×9 IMPLANT
SUT NYLON 3 0 (SUTURE) ×3 IMPLANT
SUT NYLON 4 0 (SUTURE) IMPLANT
SUT PDS AB 0 CT 36 (SUTURE) ×6 IMPLANT
WATER STERILE IRR 1000ML POUR (IV SOLUTION) ×3 IMPLANT

## 2017-07-21 NOTE — H&P (Signed)
Justin Stone is an 56 y.o. male.   Chief Complaint: left foot infection HPI:  56 y/o male with PMH of diabetes and right below knee amputation several years ago.  He presented to Dr. Arlyn Dunning office this morning and was sent to the ER at med [facility].  He reports having an ulcer of the left foot for many months.  He has not been on any abx until today.  He c/o pain in in the foot that has been increasing for the last few weeks.  He denies f/c/n/v/changes in appetite.  Past Medical History:  Diagnosis Date  . Allergy    eye allergies  . Anxiety   . Arthritis   . Diabetes mellitus without complication ([place])    takes oral meds now only  . GERD (gastroesophageal reflux disease)    past hx > 2 yrs ago  . Hepatitis    "Hepatitis C"- tx. with Harvoni- now testing negative.  . Hyperlipidemia   . Hypertension   . Neuromuscular disorder (HCC)    neuropathy hands/ feet.  . Osteopenia   . Prosthesis adjustment    right leg- below knee(weighs 10 lbs.)    Past Surgical History:  Procedure Laterality Date  . HERNIA REPAIR Left    LIH  . INSERTION OF MESH N/A 06/15/2016   Procedure: INSERTION OF MESH;  Surgeon: Greer Pickerel, MD;  Location: [facility];  Service: General;  Laterality: N/A;  . right leg removal     for diabetes, wears prosthesis -Right Below knee since '15  . UMBILICAL HERNIA REPAIR N/A 06/15/2016   Procedure: LAPAROSCOPIC ASSISTED REPAIR OF  UMBILICAL HERNIA;  Surgeon: Greer Pickerel, MD;  Location: [facility];  Service: General;  Laterality: N/A;    Family History  Problem Relation Age of Onset  . Diabetes Maternal Uncle   . Diabetes Maternal Grandmother   . Colon cancer Neg Hx   . Colon polyps Neg Hx   . Rectal cancer Neg Hx   . Stomach cancer Neg Hx   . Esophageal cancer Neg Hx    Social History:  reports that he quit smoking about 31 years ago. His smoking use included cigarettes. He has a 20.00 pack-year smoking history. he has never used smokeless tobacco. He reports  that he drinks alcohol. He reports that he does not use drugs.  Allergies: No Known Allergies   (Not in a hospital admission)  Results for orders placed or performed during the hospital encounter of 07/21/17 (from the past 48 hour(s))  CBG monitoring, ED     Status: Abnormal   Collection Time: 07/21/17 12:02 PM  Result Value Ref Range   Glucose-Capillary 163 (H) 65 - 99 mg/dL   Comment 1 Notify RN    Comment 2 Document in Chart   Basic metabolic panel     Status: Abnormal   Collection Time: 07/21/17 12:29 PM  Result Value Ref Range   Sodium 128 (L) 135 - 145 mmol/L   Potassium 3.9 3.5 - 5.1 mmol/L   Chloride 96 (L) 101 - 111 mmol/L   CO2 19 (L) 22 - 32 mmol/L   Glucose, Bld 165 (H) 65 - 99 mg/dL   BUN 33 (H) 6 - 20 mg/dL   Creatinine, Ser 1.55 (H) 0.61 - 1.24 mg/dL   Calcium 9.4 8.9 - 10.3 mg/dL   GFR calc non Af Amer 49 (L) >60 mL/min   GFR calc Af Amer 57 (L) >60 mL/min    Comment: (NOTE) The eGFR  has been calculated using the CKD EPI equation. This calculation has not been validated in all clinical situations. eGFR's persistently <60 mL/min signify possible Chronic Kidney Disease.    Anion gap 13 5 - 15  CBC with Differential/Platelet     Status: Abnormal   Collection Time: 07/21/17  1:05 PM  Result Value Ref Range   WBC 17.7 (H) 4.0 - 10.5 K/uL   RBC 3.36 (L) 4.22 - 5.81 MIL/uL   Hemoglobin 10.5 (L) 13.0 - 17.0 g/dL   HCT 32.1 (L) 39.0 - 52.0 %   MCV 95.5 78.0 - 100.0 fL   MCH 31.3 26.0 - 34.0 pg   MCHC 32.7 30.0 - 36.0 g/dL   RDW 14.0 11.5 - 15.5 %   Platelets 264 150 - 400 K/uL   Neutrophils Relative % 81 %   Neutro Abs 14.4 (H) 1.7 - 7.7 K/uL   Lymphocytes Relative 7 %   Lymphs Abs 1.2 0.7 - 4.0 K/uL   Monocytes Relative 11 %   Monocytes Absolute 1.9 (H) 0.1 - 1.0 K/uL   Eosinophils Relative 1 %   Eosinophils Absolute 0.2 0.0 - 0.7 K/uL   Basophils Relative 0 %   Basophils Absolute 0.0 0.0 - 0.1 K/uL   Dg Foot Complete Left  Result Date:  07/21/2017 CLINICAL DATA:  Left foot and infection. Decreased range of motion. History of cirrhosis, diabetes, peripheral vascular disease with below the knee amputation on the right, former smoker. EXAM: LEFT FOOT - COMPLETE 3+ VIEW COMPARISON:  None in PACs FINDINGS: There is markedly abnormal appearance of the soft tissues overlying the fifth MTP joint. Gas is present within the soft tissues. There is deformity of the head of the fifth metatarsal. The base of the proximal phalanx cannot be well evaluated. The distal phalanx appears normal. The first through fourth rays exhibit no acute abnormalities. There is degenerative change of the first MTP joint. The tarsometatarsal and intertarsal joints appear normal. There are plantar and Achilles region calcaneal spurs. There are arterial calcifications. IMPRESSION: Findings consistent with cellulitis and osteomyelitis of the distal aspect of the fifth metatarsal. Involvement of the base of the proximal phalanx may be present. There is a fracture through the head of the fifth metatarsal that may be pathologic or post traumatic. Electronically Signed   By: David  Martinique M.D.   On: 07/21/2017 13:07    ROS  No recent f/c/n/v/wt loss  Blood pressure 131/78, pulse 100, temperature 98.1 F (36.7 C), temperature source Oral, resp. rate (!) 25, height 5' 8"  (1.727 m), weight 113.4 kg (250 lb), SpO2 99 %. Physical Exam  wn wd male in nad.  A and O x 4.  Mood and affect normal.  EOMI.  resp unlabored.  L foot with gangrene extending from the lateral forefoot to the plantar arch involving greater than 50% of the plantar surface of the foot.  1+ dp and pt pulses.  Brisk cap refill at the hallux.  No lymphadenopathy.  5/5 strength in PF and DF of the ankle.  Sens to LT decreased at the forefoot dorsally and plantarly.  Assessment/Plan L foot gangrene - I explained the nature of the condition to the patient in detail.  The gangrenous portion of the foot includes nearly  all of the plantar skin from the midfoot to the lateral forefoot.  I don't believe limb salvage is possible because there is insufficient soft tissue to cover any level of amputation more distal than a BKA.  I've counseled  him that I believe BKA is necessary to provide him with a functional stump that has adequate healing potential.  The risks and benefits of the alternative treatment options have been discussed in detail.  The patient wishes to proceed with surgery and specifically understands risks of bleeding, infection, nerve damage, blood clots, need for additional surgery, revision amputation and death.   Wylene Simmer, MD 2017/07/24, 5:55 PM

## 2017-07-21 NOTE — ED Notes (Signed)
Per Dr. Juleen ChinaKohut, no blood cultures needed.

## 2017-07-21 NOTE — ED Notes (Signed)
[place]. Patient prepared and prepped for OR.

## 2017-07-21 NOTE — H&P (Signed)
History and Physical    Justin Stone Justin Stone:[id number] DOB: [birth year] DOA: 07/21/2017  PCP: Darreld Mclean, MD  Patient coming from: Home  I have personally briefly reviewed patient's old medical records in [place]  Chief Complaint: Diabetic wet gangrene of L foot  HPI: Justin Stone is a 56 y.o. male with medical history significant of DM2, s/p prior R BKA previously several years ago.  Patient has had ulcer of L foot for many months.  No ABx until today apparently.  L foot pain increasing for past few weeks.  Presented to Dr. Arlyn Dunning office this morning with grossly necrotic and unsalvageable L foot.  Sent into ED at [facility].  Transferred to [facility] ED.   ED Course: WBC 17.7k, sodium 128, creat 1.55 BUN 33, baseline 1.2.  Dr. Doran Durand saw patient in ED.  Foot was non-salvagable and grossly necrotic.  Patient taken to OR at [facility] for L BKA.  L BKA reportedly went well.  Dr. Doran Durand has recommended to continue ABx through tomorrow and asked that we take over management of patient.   Review of Systems: As per HPI otherwise 10 point review of systems negative.   Past Medical History:  Diagnosis Date  . Allergy    eye allergies  . Anxiety   . Arthritis   . Diabetes mellitus without complication ([place])    takes oral meds now only  . GERD (gastroesophageal reflux disease)    past hx > 2 yrs ago  . Hepatitis    "Hepatitis C"- tx. with Harvoni- now testing negative.  . Hyperlipidemia   . Hypertension   . Neuromuscular disorder (HCC)    neuropathy hands/ feet.  . Osteopenia   . Prosthesis adjustment    right leg- below knee(weighs 10 lbs.)    Past Surgical History:  Procedure Laterality Date  . HERNIA REPAIR Left    LIH  . INSERTION OF MESH N/A 06/15/2016   Procedure: INSERTION OF MESH;  Surgeon: Greer Pickerel, MD;  Location: [facility] ORS;  Service: General;  Laterality: N/A;  . right leg removal     for diabetes, wears prosthesis -Right Below knee since '15  . UMBILICAL  HERNIA REPAIR N/A 06/15/2016   Procedure: LAPAROSCOPIC ASSISTED REPAIR OF  UMBILICAL HERNIA;  Surgeon: Greer Pickerel, MD;  Location: [facility] ORS;  Service: General;  Laterality: N/A;     reports that he quit smoking about 31 years ago. His smoking use included cigarettes. He has a 20.00 pack-year smoking history. he has never used smokeless tobacco. He reports that he drinks alcohol. He reports that he does not use drugs.  No Known Allergies  Family History  Problem Relation Age of Onset  . Diabetes Maternal Uncle   . Diabetes Maternal Grandmother   . Colon cancer Neg Hx   . Colon polyps Neg Hx   . Rectal cancer Neg Hx   . Stomach cancer Neg Hx   . Esophageal cancer Neg Hx      Prior to Admission medications   Medication Sig Start Date End Date Taking? Authorizing Provider  amLODipine (NORVASC) 10 MG tablet TAKE 1 TABLET(10 MG) BY MOUTH DAILY 09/27/16  Yes Copland, Gay Filler, MD  aspirin EC 81 MG tablet Take 81 mg by mouth daily.   Yes [provider]  azelastine (OPTIVAR) 0.05 % ophthalmic solution Place 1 drop into both eyes 2 (two) times daily. 03/22/16  Yes Brunetta Jeans, PA-C  carisoprodol (SOMA) 350 MG tablet Take 1 tablet (350  mg total) by mouth 3 (three) times daily. 07/14/17  Yes Copland, Gay Filler, MD  ferrous sulfate 325 (65 FE) MG tablet Take 1 tablet (325 mg total) by mouth 2 (two) times daily with a meal. If constipation take just one a day 01/20/17  Yes Copland, Gay Filler, MD  furosemide (LASIX) 40 MG tablet TAKE 1 TABLET(40 MG) BY MOUTH DAILY 09/20/16  Yes Copland, Gay Filler, MD  gabapentin (NEURONTIN) 300 MG capsule Take 4 capsules (1,200 mg total) by mouth 3 (three) times daily. 04/18/17  Yes Copland, Gay Filler, MD  lisinopril (PRINIVIL,ZESTRIL) 10 MG tablet TAKE 1 TABLET BY MOUTH DAILY 07/12/17  Yes Copland, Gay Filler, MD  metFORMIN (GLUCOPHAGE) 500 MG tablet TAKE 2 TABLETS BY MOUTH IN THE MORNING AND 1 TABLET IN THE EVENING 08/09/16  Yes Copland, Gay Filler, MD    morphine (MS CONTIN) 15 MG 12 hr tablet Take 1 tablet (15 mg total) by mouth every 12 (twelve) hours. 04/18/17  Yes Copland, Gay Filler, MD  NARCAN 4 MG/0.1ML LIQD nasal spray kit CALL 911 AND U 1 SPR IN 1 NOS . REPEAT AFTRER 3 MIN IF NO OR MINIMAL RESPONSE 06/04/16  Yes [provider]  Oxycodone HCl 10 MG TABS Take 0.5-1 tablets (5-10 mg total) by mouth every 4 (four) hours as needed. This is a 30 day supply.  To fill 60 days after rx 04/18/17  Yes Copland, Gay Filler, MD  Polyethyl Glycol-Propyl Glycol (SYSTANE OP) Place 1 drop into both eyes daily as needed (dry eyes).    Yes [provider]  traZODone (DESYREL) 50 MG tablet Take 0.5 tablets (25 mg total) by mouth at bedtime as needed for sleep. 10/07/16  Yes Copland, Gay Filler, MD  blood glucose meter kit and supplies Dispense based on patient and insurance preference. Use up to four times daily as directed. (FOR ICD-9 250.00, 250.01). 12/03/16   Copland, Gay Filler, MD  glucose blood (ONETOUCH VERIO) test strip Check blood sugar daily as directed. 03/23/17   Copland, Gay Filler, MD  Lancet Device MISC Use as directed twice a day.  Patient has one touch delica lancets, please give appropriate device. 07/06/17   Copland, Gay Filler, MD  ONETOUCH DELICA LANCETS 58K MISC USE TO TEST BLOOD SUGAR TWICE DAILY 03/30/17   Copland, Gay Filler, MD  sildenafil (VIAGRA) 100 MG tablet Take 0.5-1 tablets (50-100 mg total) by mouth daily as needed for erectile dysfunction. 04/18/17   CoplandGay Filler, MD    Physical Exam: Vitals:   07/21/17 2000 07/21/17 2015 07/21/17 2030 07/21/17 2050  BP: (!) 128/98 134/69 (!) 157/73 (!) 144/79  Pulse: 98 97 100 100  Resp: 17 (!) 21 (!) 23 20  Temp:   98.3 F (36.8 C) 97.8 F (36.6 C)  TempSrc:      SpO2: 97% 96% 98% 94%  Weight:      Height:        Constitutional: NAD, calm, comfortable Eyes: PERRL, lids and conjunctivae normal ENMT: Mucous membranes are moist. Posterior pharynx clear of any exudate or  lesions.Normal dentition.  Neck: normal, supple, no masses, no thyromegaly Respiratory: clear to auscultation bilaterally, no wheezing, no crackles. Normal respiratory effort. No accessory muscle use.  Cardiovascular: Regular rate and rhythm, no murmurs / rubs / gallops. No extremity edema. 2+ pedal pulses. No carotid bruits.  Abdomen: no tenderness, no masses palpated. No hepatosplenomegaly. Bowel sounds positive.  Musculoskeletal: B BKAs, L BKA stump under dressing. Skin: no rashes, lesions, ulcers. No  induration Neurologic: CN 2-12 grossly intact. Sensation intact, DTR normal. Strength 5/5 in all 4.  Psychiatric: Normal judgment and insight. Alert and oriented x 3. Normal mood.    Labs on Admission: I have personally reviewed following labs and imaging studies  CBC: Recent Labs  Lab 07/21/17 1305  WBC 17.7*  NEUTROABS 14.4*  HGB 10.5*  HCT 32.1*  MCV 95.5  PLT 681   Basic Metabolic Panel: Recent Labs  Lab 07/21/17 1229  NA 128*  K 3.9  CL 96*  CO2 19*  GLUCOSE 165*  BUN 33*  CREATININE 1.55*  CALCIUM 9.4   GFR: Estimated Creatinine Clearance: 65.8 mL/min (A) (by C-G formula based on SCr of 1.55 mg/dL (H)). Liver Function Tests: No results for input(s): AST, ALT, ALKPHOS, BILITOT, PROT, ALBUMIN in the last 168 hours. No results for input(s): LIPASE, AMYLASE in the last 168 hours. No results for input(s): AMMONIA in the last 168 hours. Coagulation Profile: No results for input(s): INR, PROTIME in the last 168 hours. Cardiac Enzymes: No results for input(s): CKTOTAL, CKMB, CKMBINDEX, TROPONINI in the last 168 hours. BNP (last 3 results) No results for input(s): PROBNP in the last 8760 hours. HbA1C: No results for input(s): HGBA1C in the last 72 hours. CBG: Recent Labs  Lab 07/21/17 1202 07/21/17 1942  GLUCAP 163* 156*   Lipid Profile: No results for input(s): CHOL, HDL, LDLCALC, TRIG, CHOLHDL, LDLDIRECT in the last 72 hours. Thyroid Function Tests: No  results for input(s): TSH, T4TOTAL, FREET4, T3FREE, THYROIDAB in the last 72 hours. Anemia Panel: No results for input(s): VITAMINB12, FOLATE, FERRITIN, TIBC, IRON, RETICCTPCT in the last 72 hours. Urine analysis:    Component Value Date/Time   COLORURINE YELLOW 11/04/2015 1155   APPEARANCEUR CLEAR 11/04/2015 1155   LABSPEC 1.025 11/04/2015 1155   PHURINE 5.5 11/04/2015 1155   GLUCOSEU >1000 (A) 11/04/2015 1155   GLUCOSEU >=1000 (A) 10/30/2015 1040   HGBUR NEGATIVE 11/04/2015 1155   BILIRUBINUR NEGATIVE 11/04/2015 1155   KETONESUR NEGATIVE 11/04/2015 1155   PROTEINUR NEGATIVE 11/04/2015 1155   UROBILINOGEN 0.2 10/30/2015 1040   NITRITE NEGATIVE 11/04/2015 1155   LEUKOCYTESUR NEGATIVE 11/04/2015 1155    Radiological Exams on Admission: Dg Foot Complete Left  Result Date: 07/21/2017 CLINICAL DATA:  Left foot and infection. Decreased range of motion. History of cirrhosis, diabetes, peripheral vascular disease with below the knee amputation on the right, former smoker. EXAM: LEFT FOOT - COMPLETE 3+ VIEW COMPARISON:  None in PACs FINDINGS: There is markedly abnormal appearance of the soft tissues overlying the fifth MTP joint. Gas is present within the soft tissues. There is deformity of the head of the fifth metatarsal. The base of the proximal phalanx cannot be well evaluated. The distal phalanx appears normal. The first through fourth rays exhibit no acute abnormalities. There is degenerative change of the first MTP joint. The tarsometatarsal and intertarsal joints appear normal. There are plantar and Achilles region calcaneal spurs. There are arterial calcifications. IMPRESSION: Findings consistent with cellulitis and osteomyelitis of the distal aspect of the fifth metatarsal. Involvement of the base of the proximal phalanx may be present. There is a fracture through the head of the fifth metatarsal that may be pathologic or post traumatic. Electronically Signed   By: David  Martinique M.D.   On:  07/21/2017 13:07    EKG: Independently reviewed.  Assessment/Plan Principal Problem:   Diabetic wet gangrene of the foot [facility]) Active Problems:   Essential hypertension, benign   Type II diabetes mellitus,  well controlled ([place])   S/P BKA (below knee amputation), right ([place])   Gangrene of left foot ([place])   Unilateral complete BKA, left, initial encounter ([place])    1. Diabetic wet gangrene of L foot - S/P L BKA now 1. Ortho on board 2. Continue ABx through tomorrow, looks like 1 more dose of vanc tomorrow morning ordered.  Will also give 1 more dose of rocephin tomorrow afternoon. 3. PT/OT 4. Oxy PRN pain 5. Repeat CBC in AM 2. DM2 - 1. A1C 4.5 last month, only on metformin 2. I am not so sure that his A1C accurately reflects DM control in this patient who quite obviously has evidence of advanced disease (now bilateral BKAs). 1. Repeat A1C 2. Hemoglobin electrophoresis 3. Mod scale SSI AC for now 4. Hold metformin due to AKI 3. AKI - likely due to infection 1. IVF: NS at 100 cc/hr, got 500 bolus in ED 2. Hold lisinopril 3. Hold metformin 4. Repeat BMP in AM 4. HTN - 1. Hold lisinopril 2. Add PRN short acting if needed  DVT prophylaxis: Lovenox ordered by Dr. Doran Durand post op Code Status: Full Family Communication: No family in room Disposition Plan: Home after admit Consults called: Dr. Doran Durand Admission status: Admit to inpatient   [place], [place] [facility] Pager [phone]  If 7AM-7PM, please contact day team taking care of patient [email or web address] Password [facility]  07/21/2017, 10:49 PM

## 2017-07-21 NOTE — Progress Notes (Signed)
Pharmacy Antibiotic Note  Justin Stone is a 56 y.o. male admitted on 07/21/2017 with chronic necrotic foot wound. S/p R BKA. Xray shows osteomyelitis. SCr 1.5, eCrCl 60-70 ml/min.   Plan: -Vancomycin 2500 mg IV x1 then 750 mg IV q12h -Monitor renal fx, cultures, VT at Css  Height: 5\' 8"  (172.7 cm) Weight: 250 lb (113.4 kg) IBW/kg (Calculated) : 68.4  Temp (24hrs), Avg:98.1 F (36.7 C), Min:98.1 F (36.7 C), Max:98.1 F (36.7 C)  No results for input(s): WBC, CREATININE, LATICACIDVEN, VANCOTROUGH, VANCOPEAK, VANCORANDOM, GENTTROUGH, GENTPEAK, GENTRANDOM, TOBRATROUGH, TOBRAPEAK, TOBRARND, AMIKACINPEAK, AMIKACINTROU, AMIKACIN in the last 168 hours.  CrCl cannot be calculated (Patient's most recent lab result is older than the maximum 21 days allowed.).     Antimicrobials this admission: 12/27 vancomycin > 12/27 ceftriaxone x1  Dose adjustments this admission: N/A  Microbiology results: N/A  Justin Stone, Justin Stone 07/21/2017 12:23 PM

## 2017-07-21 NOTE — Anesthesia Procedure Notes (Signed)
Procedure Name: LMA Insertion Date/Time: 07/21/2017 6:24 PM Performed by: Illene SilverEvans, Jakevious Hollister E, CRNA Pre-anesthesia Checklist: Patient identified, Emergency Drugs available, Suction available and Patient being monitored Patient Re-evaluated:Patient Re-evaluated prior to induction Oxygen Delivery Method: Circle system utilized Preoxygenation: Pre-oxygenation with 100% oxygen Induction Type: IV induction Ventilation: Mask ventilation without difficulty LMA: LMA with gastric port inserted LMA Size: 5.0 Number of attempts: 2 Airway Equipment and Method: Oral airway Placement Confirmation: positive ETCO2 Tube secured with: Tape Dental Injury: Teeth and Oropharynx as per pre-operative assessment

## 2017-07-21 NOTE — ED Provider Notes (Addendum)
[facility] EMERGENCY DEPARTMENT Provider Note   CSN: [id number] Arrival date & time: 07/21/17  1131     History   Chief Complaint Chief Complaint  Patient presents with  . Skin Ulcer  . Foot Pain    HPI Justin Stone is a 56 y.o. male.  HPI   69yM from PCP for further evaluation of necrotic foot wound.  Patient is vague in terms of chronicity of the wound.  No fevers or chills.  Has a past history of diabetes and a right BKA.  He is extremely concerned about possibility of losing his foot. Nursing reporting fluid from wound pooling in footwear when they removed it.   Past Medical History:  Diagnosis Date  . Allergy    eye allergies  . Anxiety   . Arthritis   . Diabetes mellitus without complication ([place])    takes oral meds now only  . GERD (gastroesophageal reflux disease)    past hx > 2 yrs ago  . Hepatitis    "Hepatitis C"- tx. with Harvoni- now testing negative.  . Hyperlipidemia   . Hypertension   . Neuromuscular disorder (HCC)    neuropathy hands/ feet.  . Osteopenia   . Prosthesis adjustment    right leg- below knee(weighs 10 lbs.)    Patient Active Problem List   Diagnosis Date Noted  . Insomnia 05/13/2016  . Umbilical hernia without obstruction and without gangrene 05/13/2016  . Spondylosis of lumbar region without myelopathy or radiculopathy 07/22/2015  . Obesity 06/29/2015  . 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 Type II diabetes mellitus, well controlled ([place]) 03/26/2015  . S/P BKA (below knee amputation) ([place]) 03/26/2015  . Alcoholic cirrhosis of liver without ascites ([place]) 02/22/2015  . Chronic hepatitis C without hepatic coma ([place]) 02/22/2015  . Chronic low back pain 12/29/2014  . Essential hypertension, benign 12/29/2014  . Hyperlipidemia LDL goal <100 12/29/2014    Past Surgical History:  Procedure Laterality Date  . HERNIA REPAIR Left    LIH  . INSERTION OF MESH N/A 06/15/2016   Procedure:  INSERTION OF MESH;  Surgeon: Greer Pickerel, MD;  Location: [facility];  Service: General;  Laterality: N/A;  . right leg removal     for diabetes, wears prosthesis -Right Below knee since '15  . UMBILICAL HERNIA REPAIR N/A 06/15/2016   Procedure: LAPAROSCOPIC ASSISTED REPAIR OF  UMBILICAL HERNIA;  Surgeon: Greer Pickerel, MD;  Location: [facility];  Service: General;  Laterality: N/A;       Home Medications    Prior to Admission medications   Medication Sig Start Date End Date Taking? Authorizing Provider  amLODipine (NORVASC) 10 MG tablet TAKE 1 TABLET(10 MG) BY MOUTH DAILY 09/27/16   Copland, Gay Filler, MD  aspirin EC 81 MG tablet Take 81 mg by mouth daily.    [provider]  azelastine (OPTIVAR) 0.05 % ophthalmic solution Place 1 drop into both eyes 2 (two) times daily. 03/22/16   Brunetta Jeans, PA-C  blood glucose meter kit and supplies Dispense based on patient and insurance preference. Use up to four times daily as directed. (FOR ICD-9 250.00, 250.01). 12/03/16   Copland, Gay Filler, MD  carisoprodol (SOMA) 350 MG tablet Take 1 tablet (350 mg total) by mouth 3 (three) times daily. 07/14/17   Copland, Gay Filler, MD  doxycycline (VIBRAMYCIN) 100 MG capsule Take 1 capsule (100 mg total) by mouth 2 (two) times daily. 07/11/17   Copland, Gay Filler,  MD  ferrous sulfate 325 (65 FE) MG tablet Take 1 tablet (325 mg total) by mouth 2 (two) times daily with a meal. If constipation take just one a day 01/20/17   Copland, Gay Filler, MD  furosemide (LASIX) 40 MG tablet TAKE 1 TABLET(40 MG) BY MOUTH DAILY 09/20/16   Copland, Gay Filler, MD  gabapentin (NEURONTIN) 300 MG capsule Take 4 capsules (1,200 mg total) by mouth 3 (three) times daily. 04/18/17   Copland, Gay Filler, MD  glucose blood (ONETOUCH VERIO) test strip Check blood sugar daily as directed. 03/23/17   Copland, Gay Filler, MD  Lancet Device MISC Use as directed twice a day.  Patient has one touch delica lancets, please give appropriate device.  07/06/17   Copland, Gay Filler, MD  lisinopril (PRINIVIL,ZESTRIL) 10 MG tablet TAKE 1 TABLET BY MOUTH DAILY 07/12/17   Copland, Gay Filler, MD  lisinopril (PRINIVIL,ZESTRIL) 20 MG tablet Take 1 tablet (20 mg total) by mouth daily. 01/11/17   Copland, Gay Filler, MD  metFORMIN (GLUCOPHAGE) 500 MG tablet TAKE 2 TABLETS BY MOUTH IN THE MORNING AND 1 TABLET IN THE EVENING 08/09/16   Copland, Gay Filler, MD  morphine (MS CONTIN) 15 MG 12 hr tablet Take 1 tablet (15 mg total) by mouth every 12 (twelve) hours. 04/18/17   Copland, Gay Filler, MD  morphine (MS CONTIN) 15 MG 12 hr tablet Take 1 tablet (15 mg total) by mouth every 12 (twelve) hours. To fill in 30 days 04/18/17   Copland, Gay Filler, MD  morphine (MS CONTIN) 15 MG 12 hr tablet Take 1 tablet (15 mg total) by mouth every 12 (twelve) hours. 07/14/17   Copland, Gay Filler, MD  NARCAN 4 MG/0.1ML LIQD nasal spray kit CALL 911 AND U 1 SPR IN 1 NOS . REPEAT AFTRER 3 MIN IF NO OR MINIMAL RESPONSE 06/04/16   [provider]  [facility] DELICA LANCETS 36O MISC USE TO TEST BLOOD SUGAR TWICE DAILY 03/30/17   Copland, Gay Filler, MD  Oxycodone HCl 10 MG TABS Take 0.5-1 tablets (5-10 mg total) by mouth every 4 (four) hours as needed. This is a 30 day supply.  To fill 60 days after rx 04/18/17   Copland, Gay Filler, MD  Oxycodone HCl 10 MG TABS Take 0.5-1 tablets (5-10 mg total) by mouth every 4 (four) hours as needed. This is a 30 day supply.  To fill 30 days after rx 04/18/17   Copland, Gay Filler, MD  Oxycodone HCl 10 MG TABS Take 0.5-1 tablets (5-10 mg total) by mouth every 4 (four) hours as needed. This is a 30 day supply. 07/06/17   Copland, Gay Filler, MD  Polyethyl Glycol-Propyl Glycol (SYSTANE OP) Apply to eye.    [provider]  polyethylene glycol powder (GLYCOLAX/MIRALAX) powder Take 1 Container by mouth once.    [provider]  sildenafil (REVATIO) 20 MG tablet Take 1 tablet by mouth as directed 30 minutes before intercourse 02/17/16   Brunetta Jeans, PA-C  sildenafil (VIAGRA) 100 MG tablet Take 0.5-1 tablets (50-100 mg total) by mouth daily as needed for erectile dysfunction. 04/18/17   Copland, Gay Filler, MD  simvastatin (ZOCOR) 10 MG tablet TAKE 1 TABLET BY MOUTH AT BEDTIME 06/08/16   Copland, Gay Filler, MD  traZODone (DESYREL) 50 MG tablet Take 0.5 tablets (25 mg total) by mouth at bedtime as needed for sleep. 10/07/16   Copland, Gay Filler, MD  venlafaxine (EFFEXOR) 75 MG tablet TAKE 1 TABLET(75 MG) BY MOUTH TWICE  DAILY 10/07/16   Copland, Gay Filler, MD  venlafaxine (EFFEXOR) 75 MG tablet TAKE 1 TABLET(75 MG) BY MOUTH TWICE DAILY 01/21/17   Copland, Gay Filler, MD    Family History Family History  Problem Relation Age of Onset  . Diabetes Maternal Uncle   . Diabetes Maternal Grandmother   . Colon cancer Neg Hx   . Colon polyps Neg Hx   . Rectal cancer Neg Hx   . Stomach cancer Neg Hx   . Esophageal cancer Neg Hx     Social History Social History   Tobacco Use  . Smoking status: Former Smoker    Packs/day: 1.00    Years: 20.00    Pack years: 20.00    Types: Cigarettes    Last attempt to quit: 06/10/1986    Years since quitting: 31.1  . Smokeless tobacco: Never Used  Substance Use Topics  . Alcohol use: Yes    Comment: 1 quart every 3 days - 24 oz cans  . Drug use: No     Allergies   Patient has no known allergies.   Review of Systems Review of Systems  All systems reviewed and negative, other than as noted in HPI.  Physical Exam Updated Vital Signs BP 138/65 (BP Location: Left Arm)   Pulse (!) 103   Temp 98.1 F (36.7 C) (Oral)   Resp 20   Ht 5' 8" (1.727 m)   Wt 113.4 kg (250 lb)   SpO2 99%   BMI 38.01 kg/m   Physical Exam  Constitutional: He appears well-developed and well-nourished. No distress.  HENT:  Head: Normocephalic and atraumatic.  Eyes: Conjunctivae are normal. Right eye exhibits no discharge. Left eye exhibits no discharge.  Neck: Neck supple.  Cardiovascular: Normal rate,  regular rhythm and normal heart sounds. Exam reveals no gallop and no friction rub.  No murmur heard. Pulmonary/Chest: Effort normal and breath sounds normal. No respiratory distress.  Abdominal: Soft. He exhibits no distension. There is no tenderness.  Musculoskeletal: He exhibits no edema or tenderness.  Right BKA.  Left lower extremity is diffusely swollen from the knee distally into the foot.  There is increased redness and warmth diffusely.  The patient has a large necrotic wound centered over the plantar/lateral surface of the distal foot with significant drainage.  Foul odor.  Nonpalpable DP pulse but it is dopplerable.  Neurological: He is alert.  Skin: Skin is warm and dry.  Psychiatric: He has a normal mood and affect. His behavior is normal. Thought content normal.  Nursing note and vitals reviewed.    ED Treatments / Results  Labs (all labs ordered are listed, but only abnormal results are displayed) Labs Reviewed  BASIC METABOLIC PANEL - Abnormal; Notable for the following components:      Result Value   Sodium 128 (*)    Chloride 96 (*)    CO2 19 (*)    Glucose, Bld 165 (*)    BUN 33 (*)    Creatinine, Ser 1.55 (*)    GFR calc non Af Amer 49 (*)    GFR calc Af Amer 57 (*)    All other components within normal limits  CBC WITH DIFFERENTIAL/PLATELET - Abnormal; Notable for the following components:   WBC 17.7 (*)    RBC 3.36 (*)    Hemoglobin 10.5 (*)    HCT 32.1 (*)    Neutro Abs 14.4 (*)    Monocytes Absolute 1.9 (*)    All other components within  normal limits  CBG MONITORING, ED - Abnormal; Notable for the following components:   Glucose-Capillary 163 (*)    All other components within normal limits  CBC WITH DIFFERENTIAL/PLATELET    EKG  EKG Interpretation None       Radiology Dg Foot Complete Left  Result Date: 07/21/2017 CLINICAL DATA:  Left foot and infection. Decreased range of motion. History of cirrhosis, diabetes, peripheral vascular  disease with below the knee amputation on the right, former smoker. EXAM: LEFT FOOT - COMPLETE 3+ VIEW COMPARISON:  None in PACs FINDINGS: There is markedly abnormal appearance of the soft tissues overlying the fifth MTP joint. Gas is present within the soft tissues. There is deformity of the head of the fifth metatarsal. The base of the proximal phalanx cannot be well evaluated. The distal phalanx appears normal. The first through fourth rays exhibit no acute abnormalities. There is degenerative change of the first MTP joint. The tarsometatarsal and intertarsal joints appear normal. There are plantar and Achilles region calcaneal spurs. There are arterial calcifications. IMPRESSION: Findings consistent with cellulitis and osteomyelitis of the distal aspect of the fifth metatarsal. Involvement of the base of the proximal phalanx may be present. There is a fracture through the head of the fifth metatarsal that may be pathologic or post traumatic. Electronically Signed   By: David  Martinique M.D.   On: 07/21/2017 13:07    Procedures Procedures (including critical care time)  Medications Ordered in ED Medications  0.9 %  sodium chloride infusion ( Intravenous New Bag/Given 07/21/17 1235)  vancomycin (VANCOCIN) 2,500 mg in sodium chloride 0.9 % 500 mL IVPB (2,500 mg Intravenous New Bag/Given 07/21/17 1328)  vancomycin (VANCOCIN) 500 MG powder (not administered)  sodium chloride 0.9 % bolus 500 mL (not administered)  0.9 %  sodium chloride infusion (not administered)  vancomycin (VANCOCIN) IVPB 750 mg/150 ml premix (not administered)  HYDROmorphone (DILAUDID) injection 1.5 mg (1.5 mg Intravenous Given 07/21/17 1234)  cefTRIAXone (ROCEPHIN) 2 g in dextrose 5 % 50 mL IVPB (0 g Intravenous Stopped 07/21/17 1430)     Initial Impression / Assessment and Plan / ED Course  I have reviewed the triage vital signs and the nursing notes.  Pertinent labs & imaging results that were available during my care of the  patient were reviewed by me and considered in my medical decision making (see chart for details).  Clinical Course as of Jul 21 1514  Thu Jul 21, 2017  1457 Discussed with Dr Doran Durand, orthopedic surgery. Available currently and would like to operate on him this afternoon/evening. Pt last ate/drank last night. Requested keep NPO and no blood thinners prior to his evaluation. Going to be ED-to-ED transfer to try and expedite. Discussed with Dr Venora Maples in [facility] ED. Tentative plan for Dr Doran Durand to see and then take to OR. Previously discussed with Dr. Loralee Pacas for admission who requested verification of surgical availability prior to transfer and to notify her again via [place]. [facility] was notified of plan.   [SK]    Clinical Course User Index [SK] Justin Manifold, MD    56 year old male with extensive necrotic wound to his left foot.  Plain film evidence of osteomyelitis. Pt is s/p R BKA and extremely concerned about possibility of his L foot being amputated.  I suspect that this is the reason he delayed evaluation. I explained that antibiotics alone will not be sufficient and will require surgery but the extent will be determined by a surgeon. Admit for ongoing tx/surgical evaluation.  Final Clinical Impressions(s) / ED Diagnoses   Final diagnoses:  Diabetic wet gangrene of the foot ([place])  Osteomyelitis of left foot, unspecified type [facility])    ED Discharge Orders    None       Justin Manifold, MD 07/21/17 1353    Justin Manifold, MD 07/21/17 1515

## 2017-07-21 NOTE — Anesthesia Postprocedure Evaluation (Signed)
Anesthesia Post Note  Patient: Justin Stone  Procedure(s) Performed: LEFT  BELOW KNEE AMPUTATION (Left Leg Lower)     Patient location during evaluation: PACU Anesthesia Type: General Level of consciousness: awake and alert Pain management: pain level controlled Vital Signs Assessment: post-procedure vital signs reviewed and stable Respiratory status: spontaneous breathing, nonlabored ventilation, respiratory function stable and patient connected to nasal cannula oxygen Cardiovascular status: blood pressure returned to baseline and stable Postop Assessment: no apparent nausea or vomiting Anesthetic complications: no    Last Vitals:  Vitals:   07/21/17 1941 07/21/17 1945  BP: 133/74 139/79  Pulse: (!) 103 (!) 101  Resp: (!) 35 (!) 26  Temp: 36.9 C   SpO2: 97% 100%    Last Pain:  Vitals:   07/21/17 1955  TempSrc:   PainSc: 9                  Phillips Groutarignan, Andriel Omalley

## 2017-07-21 NOTE — Progress Notes (Addendum)
[facility] at [facility] [address] [place], [place] [address] [phone]  Date:  07/21/2017   Name:  Justin Stone   DOB:  [birth year]   MRN:  [id number]  PCP:  Darreld Mclean, MD    Chief Complaint: No chief complaint on file.   History of Present Illness:  Justin Stone is a 56 y.o. very pleasant male patient who presents with the following:  Pt with DM, history of right BKA, chronic phantom pain treated with narcotics Here today with concern of pain and foul smell from his left foot He has a history of left leg swelling - we discussed this in October and we did an [place] which was negative for clot.  We had tried adjusting his BP medications to help with swelling but were limited in diuretic use due to his renal function.  He did not come back in to repeat his BMP at that time so we were not able to try a higher dose of diuretic, and he did not follow-up with me further about this issue. I did also refer him to neurology for his long standing history of peripheral neuropathy   I saw him in mid-December for another issue- a fall- at that time he did not mention his leg or foot to me although we discussed several other issues. Today he comes in with complaint of severe odor and pain from a wound on his left foot.  He cannot really tell me how long this has been going on, but it was not present the last time we saw him.  However he seems unable to tell me how long he had this wound on his foot, but has noted a foul odor and leakage of fluid for an undetermined amount of time  I would have last seen his feet in October of this year   Patient Active Problem List   Diagnosis Date Noted  . Insomnia 05/13/2016  . Umbilical hernia without obstruction and without gangrene 05/13/2016  . Spondylosis of lumbar region without myelopathy or radiculopathy 07/22/2015  . Obesity 06/29/2015  . 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 Type II diabetes mellitus, well controlled ([place]) 03/26/2015  . S/P BKA (below knee amputation) ([place]) 03/26/2015  . Alcoholic cirrhosis of liver without ascites ([place]) 02/22/2015  . Chronic hepatitis C without hepatic coma ([place]) 02/22/2015  . Chronic low back pain 12/29/2014  . Essential hypertension, benign 12/29/2014  . Hyperlipidemia LDL goal <100 12/29/2014    Past Medical History:  Diagnosis Date  . Allergy    eye allergies  . Anxiety   . Arthritis   . Diabetes mellitus without complication ([place])    takes oral meds now only  . GERD (gastroesophageal reflux disease)    past hx > 2 yrs ago  . Hepatitis    "Hepatitis C"- tx. with Harvoni- now testing negative.  . Hyperlipidemia   . Hypertension   . Neuromuscular disorder (HCC)    neuropathy hands/ feet.  . Osteopenia   . Prosthesis adjustment    right leg- below knee(weighs 10 lbs.)    Past Surgical History:  Procedure Laterality Date  . HERNIA REPAIR Left    LIH  . INSERTION OF MESH N/A 06/15/2016   Procedure: INSERTION OF MESH;  Surgeon: Greer Pickerel, MD;  Location: [facility];  Service: General;  Laterality: N/A;  . right leg removal  for diabetes, wears prosthesis -Right Below knee since '15  . UMBILICAL HERNIA REPAIR N/A 06/15/2016   Procedure: LAPAROSCOPIC ASSISTED REPAIR OF  UMBILICAL HERNIA;  Surgeon: Greer Pickerel, MD;  Location: [facility];  Service: General;  Laterality: N/A;    Social History   Tobacco Use  . Smoking status: Former Smoker    Packs/day: 1.00    Years: 20.00    Pack years: 20.00    Types: Cigarettes    Last attempt to quit: 06/10/1986    Years since quitting: 31.1  . Smokeless tobacco: Never Used  Substance Use Topics  . Alcohol use: Yes    Comment: 1 quart every 3 days - 24 oz cans  . Drug use: No    Family History  Problem Relation Age of Onset  . Diabetes Maternal Uncle   . Diabetes Maternal Grandmother   . Colon cancer Neg Hx   . Colon polyps Neg Hx   .  Rectal cancer Neg Hx   . Stomach cancer Neg Hx   . Esophageal cancer Neg Hx     No Known Allergies  Medication list has been reviewed and updated.  Current Outpatient Medications on File Prior to Visit  Medication Sig Dispense Refill  . amLODipine (NORVASC) 10 MG tablet TAKE 1 TABLET(10 MG) BY MOUTH DAILY 90 tablet 3  . aspirin EC 81 MG tablet Take 81 mg by mouth daily.    [place] azelastine (OPTIVAR) 0.05 % ophthalmic solution Place 1 drop into both eyes 2 (two) times daily. 6 mL 12  . blood glucose meter kit and supplies Dispense based on patient and insurance preference. Use up to four times daily as directed. (FOR ICD-9 250.00, 250.01). 1 each 0  . carisoprodol (SOMA) 350 MG tablet Take 1 tablet (350 mg total) by mouth 3 (three) times daily. 90 tablet 2  . doxycycline (VIBRAMYCIN) 100 MG capsule Take 1 capsule (100 mg total) by mouth 2 (two) times daily. 20 capsule 0  . ferrous sulfate 325 (65 FE) MG tablet Take 1 tablet (325 mg total) by mouth 2 (two) times daily with a meal. If constipation take just one a day 60 tablet 3  . furosemide (LASIX) 40 MG tablet TAKE 1 TABLET(40 MG) BY MOUTH DAILY 90 tablet 3  . gabapentin (NEURONTIN) 300 MG capsule Take 4 capsules (1,200 mg total) by mouth 3 (three) times daily. 360 capsule 5  . glucose blood (ONETOUCH VERIO) test strip Check blood sugar daily as directed. 100 each 3  . Lancet Device MISC Use as directed twice a day.  Patient has one touch delica lancets, please give appropriate device. 1 each 0  . lisinopril (PRINIVIL,ZESTRIL) 10 MG tablet TAKE 1 TABLET BY MOUTH DAILY 90 tablet 0  . lisinopril (PRINIVIL,ZESTRIL) 20 MG tablet Take 1 tablet (20 mg total) by mouth daily. 90 tablet 0  . metFORMIN (GLUCOPHAGE) 500 MG tablet TAKE 2 TABLETS BY MOUTH IN THE MORNING AND 1 TABLET IN THE EVENING 270 tablet 2  . morphine (MS CONTIN) 15 MG 12 hr tablet Take 1 tablet (15 mg total) by mouth every 12 (twelve) hours. 60 tablet 0  . morphine (MS CONTIN) 15 MG 12  hr tablet Take 1 tablet (15 mg total) by mouth every 12 (twelve) hours. To fill in 30 days 60 tablet 0  . morphine (MS CONTIN) 15 MG 12 hr tablet Take 1 tablet (15 mg total) by mouth every 12 (twelve) hours. 60 tablet 0  . NARCAN 4 MG/0.1ML LIQD  nasal spray kit CALL 911 AND U 1 SPR IN 1 NOS . REPEAT AFTRER 3 MIN IF NO OR MINIMAL RESPONSE  0  . ONETOUCH DELICA LANCETS 47M MISC USE TO TEST BLOOD SUGAR TWICE DAILY 200 each 5  . Oxycodone HCl 10 MG TABS Take 0.5-1 tablets (5-10 mg total) by mouth every 4 (four) hours as needed. This is a 30 day supply.  To fill 60 days after rx 70 tablet 0  . Oxycodone HCl 10 MG TABS Take 0.5-1 tablets (5-10 mg total) by mouth every 4 (four) hours as needed. This is a 30 day supply.  To fill 30 days after rx 70 tablet 0  . Oxycodone HCl 10 MG TABS Take 0.5-1 tablets (5-10 mg total) by mouth every 4 (four) hours as needed. This is a 30 day supply. 70 tablet 0  . Polyethyl Glycol-Propyl Glycol (SYSTANE OP) Apply to eye.    . polyethylene glycol powder (GLYCOLAX/MIRALAX) powder Take 1 Container by mouth once.    . sildenafil (REVATIO) 20 MG tablet Take 1 tablet by mouth as directed 30 minutes before intercourse 50 tablet 0  . sildenafil (VIAGRA) 100 MG tablet Take 0.5-1 tablets (50-100 mg total) by mouth daily as needed for erectile dysfunction. 5 tablet 11  . simvastatin (ZOCOR) 10 MG tablet TAKE 1 TABLET BY MOUTH AT BEDTIME 90 tablet 3  . traZODone (DESYREL) 50 MG tablet Take 0.5 tablets (25 mg total) by mouth at bedtime as needed for sleep. 45 tablet 3  . venlafaxine (EFFEXOR) 75 MG tablet TAKE 1 TABLET(75 MG) BY MOUTH TWICE DAILY 180 tablet 3  . venlafaxine (EFFEXOR) 75 MG tablet TAKE 1 TABLET(75 MG) BY MOUTH TWICE DAILY 180 tablet 0   No current facility-administered medications on file prior to visit.     Review of Systems:  As per HPI- otherwise negative.   Physical Examination: Vitals:   07/21/17 1052  BP: (!) 115/51  Pulse: (!) 107  Resp: 16  Temp:  98.1 F (36.7 C)  SpO2: 100%   Vitals:   07/21/17 1052  Weight: 250 lb 12.8 oz (113.8 kg)  Height: 5' 8" (1.727 m)   Body mass index is 38.13 kg/m. Ideal Body Weight: Weight in (lb) to have BMI = 25: 164.1   GEN: WDWN, NAD, Non-toxic, Alert & Oriented x 3, obese, looks well except for leg HEENT: Atraumatic, Normocephalic.  Ears and Nose: No external deformity. EXTR: No clubbing/cyanosis/edema NEURO: pt has right BKA.  He walked in today PSYCH: Normally interactive. Conversant. Not depressed or anxious appearing.  Calm demeanor.  Pt has a very foul odor about him today.  His left lower leg shows it normal chronic stasis changes.  However he now has obvious gangrene of the bottom of his left foot with ulceration and severe necrotic odor  Assessment and Plan: Gangrene associated with diabetes mellitus ([place])  Pt is here today with gangrene of his left foot.  We are not sure how long he has had this infection and ulceration of the left foot.  Unfortunately he had not mentioned this to me in the past and he now has a very severe problem, I am afraid he will need an amputation Pt is taken to the ER downstairs today to begin evaluation, and will almost certainly need to be admitted    Signed Lamar Blinks, MD   addnd 12/28: On first evaluation yesterday pt stated "You did this to me doc, I've been telling you about this leg for months."  However per my notes and my thoughtful recollection, he has never mentioned a problem with his foot or any wound on his foot in the past.

## 2017-07-21 NOTE — ED Notes (Signed)
Per [facility], patient has had 2500 mg of vancomycin and 2 g of rocephin prior to arrival.  Received 1.5 of dilaudid total for pain relief.  Pt A&O x4.  BKA on RLE.

## 2017-07-21 NOTE — ED Triage Notes (Signed)
Ft pain X 2 months, Noticed today that it was discolored and is more painful than ever.

## 2017-07-21 NOTE — Transfer of Care (Signed)
Immediate Anesthesia Transfer of Care Note  Patient: Justin Stone  Procedure(s) Performed: LEFT  BELOW KNEE AMPUTATION (Left Leg Lower)  Patient Location: PACU  Anesthesia Type:General  Level of Consciousness: awake, alert , oriented and patient cooperative  Airway & Oxygen Therapy: Patient Spontanous Breathing and Patient connected to face mask oxygen  Post-op Assessment: Report given to RN, Post -op Vital signs reviewed and stable and Patient moving all extremities X 4  Post vital signs: stable  Last Vitals:  Vitals:   07/21/17 1154 07/21/17 1500  BP: 138/65 131/78  Pulse: (!) 103 100  Resp: 20 (!) 25  Temp: 36.7 C   SpO2: 99% 99%    Last Pain:  Vitals:   07/21/17 1558  TempSrc:   PainSc: 8          Complications: No apparent anesthesia complications

## 2017-07-21 NOTE — ED Notes (Signed)
Patient's belongings placed in TCU locker #33. 3 bags placed in locker, including patient's wallet, cellphone, glasses, clothing and one shoe.

## 2017-07-21 NOTE — Anesthesia Preprocedure Evaluation (Addendum)
Anesthesia Evaluation  Patient identified by MRN, date of birth, ID band Patient awake    Reviewed: Allergy & Precautions, NPO status , Patient's Chart, lab work & pertinent test results  Airway Mallampati: II  TM Distance: >3 FB Neck ROM: Full    Dental no notable dental hx.    Pulmonary former smoker,    Pulmonary exam normal breath sounds clear to auscultation       Cardiovascular hypertension, Pt. on medications Normal cardiovascular exam Rhythm:Regular Rate:Normal     Neuro/Psych negative neurological ROS  negative psych ROS   GI/Hepatic negative GI ROS, Neg liver ROS, neg GERD  ,  Endo/Other  diabetes, Poorly Controlled, Type 2, Oral Hypoglycemic Agents  Renal/GU negative Renal ROS  negative genitourinary   Musculoskeletal negative musculoskeletal ROS (+)   Abdominal   Peds negative pediatric ROS (+)  Hematology negative hematology ROS (+)   Anesthesia Other Findings   Reproductive/Obstetrics negative OB ROS                            Anesthesia Physical Anesthesia Plan  ASA: III  Anesthesia Plan: General   Post-op Pain Management:    Induction: Intravenous  PONV Risk Score and Plan: 2 and Ondansetron and Treatment may vary due to age or medical condition  Airway Management Planned: LMA  Additional Equipment:   Intra-op Plan:   Post-operative Plan: Extubation in OR  Informed Consent: I have reviewed the patients History and Physical, chart, labs and discussed the procedure including the risks, benefits and alternatives for the proposed anesthesia with the patient or authorized representative who has indicated his/her understanding and acceptance.   Dental advisory given  Plan Discussed with: CRNA  Anesthesia Plan Comments:         Anesthesia Quick Evaluation

## 2017-07-21 NOTE — Op Note (Signed)
07/21/2017  7:34 PM  PATIENT:  Justin Stone  56 y.o. male  PRE-OPERATIVE DIAGNOSIS:  left foot gangrene  POST-OPERATIVE DIAGNOSIS:  left foot gangrene  Procedure(s):  LEFT  BELOW KNEE AMPUTATION  SURGEON:  Toni ArthursJohn Christopher Hink, MD  ASSISTANT: none  ANESTHESIA:   General  EBL:  minimal   TOURNIQUET:   Total Tourniquet Time Documented: Thigh (Left) - 47 minutes Total: Thigh (Left) - 47 minutes  COMPLICATIONS:  None apparent  DISPOSITION:  Extubated, awake and stable to recovery.  INDICATION FOR PROCEDURE: The patient is a 56 year old male with past medical history significant for diabetes.  He presented to the emergency room at [facility] this morning after seeing Dr. Dallas Schimkeopeland.  He complains of an ulcer at his plantar left forefoot that has been there for months.  X-rays in the emergency room show osteomyelitis of the fifth metatarsal.  He is noted on physical exam to have gangrene of his left foot involving nearly the entire plantar surface of the midfoot and forefoot as well as all of the lateral foot extending back to the level of the ankle.  Salvage of the foot is not possible due to the extensive gangrenous changes of the soft tissues of the foot both dorsally and plantarly.  He presents now for left below-knee amputation.  The risks and benefits of the alternative treatment options have been discussed in detail.  The patient wishes to proceed with surgery and specifically understands risks of bleeding, infection, nerve damage, blood clots, need for additional surgery, revision amputation and death.  PROCEDURE IN DETAIL:  After pre operative consent was obtained, and the correct operative site was identified, the patient was brought to the operating room and placed supine on the OR table.  Anesthesia was administered.  Pre-operative antibiotics were administered.  A surgical timeout was taken.  Left lower extremity was prepped and draped in standard sterile fashion with a  tourniquet around the thigh.  The extremity was elevated for several minutes and the tourniquet was inflated to 300 mmHg.  A posterior flap incision was marked on the skin approximately 15 cm distal from the medial joint line of the knee.  The incision was made and dissection was carried down through the skin and subcutaneous tissues.  The anterior tibial periosteum was incised and elevated.  Soft tissues were protected and the tibia was cut with a reciprocating saw.  The fibula was then dissected and cut just proximal to the tibial cut.  The leg was then retracted anteriorly and amputation knife was used to cut through the posterior soft tissues creating a tapered flap.  The name of vascular structures were suture ligated with 2-0 silk ties.  The cut edges of bone were smoothed with a rasp.  The named nerves were transected sharply while held under gentle tension.  The wound was then irrigated copiously.  The gastrocnemius fascia was repaired to the anterior periosteum with imbricating sutures of 0 PDS.  Subcutaneous tissues were then approximated with inverted simple sutures of 3-0 Monocryl.  The skin incision was closed with simple and horizontal mattress sutures of 2-0 nylon.  Sterile dressings were applied followed by a compression wrap.  Tourniquet was released after application of the dressings.  Patient was awakened from anesthesia and transported to the recovery room in stable condition.  FOLLOW UP PLAN: The patient will be admitted to the hospitalist service for management of his medical comorbidities.  He will begin physical therapy tomorrow.  Nonweightbearing on the  left lower extremity until his stump heals.  He can begin DVT prophylaxis tomorrow.

## 2017-07-22 ENCOUNTER — Encounter (HOSPITAL_COMMUNITY): Payer: Self-pay | Admitting: Orthopedic Surgery

## 2017-07-22 DIAGNOSIS — Z89519 Acquired absence of unspecified leg below knee: Secondary | ICD-10-CM

## 2017-07-22 DIAGNOSIS — N179 Acute kidney failure, unspecified: Secondary | ICD-10-CM

## 2017-07-22 DIAGNOSIS — M869 Osteomyelitis, unspecified: Secondary | ICD-10-CM

## 2017-07-22 DIAGNOSIS — E1159 Type 2 diabetes mellitus with other circulatory complications: Secondary | ICD-10-CM

## 2017-07-22 LAB — BASIC METABOLIC PANEL
Anion gap: 8 (ref 5–15)
BUN: 24 mg/dL — AB (ref 6–20)
CHLORIDE: 100 mmol/L — AB (ref 101–111)
CO2: 23 mmol/L (ref 22–32)
CREATININE: 1.26 mg/dL — AB (ref 0.61–1.24)
Calcium: 9 mg/dL (ref 8.9–10.3)
GFR calc Af Amer: >60 mL/min (ref >60)
GFR calc non Af Amer: >60 mL/min (ref >60)
Glucose, Bld: 172 mg/dL — ABNORMAL HIGH (ref 65–99)
Potassium: 4 mmol/L (ref 3.5–5.1)
SODIUM: 131 mmol/L — AB (ref 135–145)

## 2017-07-22 LAB — CBC
HCT: 29.7 % — ABNORMAL LOW (ref 39.0–52.0)
Hemoglobin: 9.6 g/dL — ABNORMAL LOW (ref 13.0–17.0)
MCH: 31.4 pg (ref 26.0–34.0)
MCHC: 32.3 g/dL (ref 30.0–36.0)
MCV: 97.1 fL (ref 78.0–100.0)
PLATELETS: 238 10*3/uL (ref 150–400)
RBC: 3.06 MIL/uL — ABNORMAL LOW (ref 4.22–5.81)
RDW: 14.5 % (ref 11.5–15.5)
WBC: 16.8 10*3/uL — AB (ref 4.0–10.5)

## 2017-07-22 LAB — GLUCOSE, CAPILLARY
GLUCOSE-CAPILLARY: 167 mg/dL — AB (ref 65–99)
GLUCOSE-CAPILLARY: 161 mg/dL — AB (ref 65–99)
GLUCOSE-CAPILLARY: 146 mg/dL — AB (ref 65–99)
Glucose-Capillary: 140 mg/dL — ABNORMAL HIGH (ref 65–99)

## 2017-07-22 NOTE — Progress Notes (Signed)
[facility] inpatient rehab admissions - I have received inpatient rehab consult on this patient.  I will await OT evaluation and then [facility]  I will follow up on Monday for plans.  Call me for questions.  #[phone]

## 2017-07-22 NOTE — Progress Notes (Signed)
Left stump dressing with minimal amount of serosanguinous drainage, dressing is reinforced with  2 ABD pads and ACE wrap

## 2017-07-22 NOTE — Progress Notes (Signed)
Nutrition Brief Note  Patient identified on the Malnutrition Screening Tool (MST) Report  Wt Readings from Last 15 Encounters:  07/21/17 250 lb (113.4 kg)  07/21/17 250 lb 12.8 oz (113.8 kg)  07/06/17 268 lb 6.4 oz (121.7 kg)  05/09/17 247 lb 12.8 oz (112.4 kg)  04/18/17 238 lb 6.4 oz (108.1 kg)  01/13/17 254 lb 12.8 oz (115.6 kg)  10/07/16 251 lb 12.8 oz (114.2 kg)  08/02/16 243 lb (110.2 kg)  07/01/16 244 lb (110.7 kg)  06/15/16 229 lb (103.9 kg)  06/10/16 229 lb 6 oz (104 kg)  05/19/16 211 lb 3.2 oz (95.8 kg)  05/11/16 239 lb (108.4 kg)  05/05/16 217 lb 6 oz (98.6 kg)  04/05/16 220 lb (99.8 kg)    Body mass index is 38.01 kg/m. Patient meets criteria for obesity based on current BMI. Pt reports losing 25 lbs in the past 1 month which is not consistent with weight trends in the chart. Pt with hx of Type 2 DM and had R BKA a few years ago. He has had an ulcer on his L foot for several weeks and it was found to be necrotic and unsalvageable. Pt is POD #1 L BKA.   Current diet order is Carb Modified.  Medications reviewed; Labs reviewed; CBG: 167 mg/dL, Na: 161131 mmol/L, Cl: 096100 mmol/L, BUN: 24 mg/dL, creatinine: 0.451.26 mg/dL.  No nutrition interventions warranted at this time. If nutrition issues arise, please consult RD.     Justin GammonJessica Camry Theiss, MS, RD, LDN, [facility] Inpatient Clinical Dietitian Pager # [phone] After hours/weekend pager # [phone]

## 2017-07-22 NOTE — Progress Notes (Signed)
Patient said his phone is missing. I have called ED, OR, Short Stay and security here at [facility]. I have called [facility] as he was transferred before coming here. They are looking and will call back if they find.

## 2017-07-22 NOTE — Discharge Instructions (Signed)
Toni ArthursJohn Hewitt, MD [facility]  Please read the following information regarding your care after surgery.  Medications  You only need a prescription for the narcotic pain medicine (ex. oxycodone, Percocet, Norco).  All of the other medicines listed below are available over the counter. X Aleve 2 pills twice a day for the first 3 days after surgery. X acetominophen (Tylenol) 650 mg every 4-6 hours as you need for minor to moderate pain X narcotoc as prescribed for severe pain  Narcotic pain medicine (ex. oxycodone, Percocet, Vicodin) will cause constipation.  To prevent this problem, take the following medicines while you are taking any pain medicine. X docusate sodium (Colace) 100 mg twice a day X senna (Senokot) 2 tablets twice a day  X To help prevent blood clots, take a baby aspirin (81 mg) twice a day for two weeks after surgery.  You should also get up every hour while you are awake to move around.    Weight Bearing X Do not bear any weight on the operated leg or foot.  Cast / Splint / Dressing X Keep your splint, cast or dressing clean and dry.  Dont put anything (coat hanger, pencil, etc) down inside of it.  If it gets damp, use a hair dryer on the cool setting to dry it.  If it gets soaked, call the office to schedule an appointment for a cast change. X Wear stump protector at all times   After your dressing, cast or splint is removed; you may shower, but do not soak or scrub the wound.  Allow the water to run over it, and then gently pat it dry.  Swelling It is normal for you to have swelling where you had surgery.  To reduce swelling and pain, keep your toes above your nose for at least 3 days after surgery.  It may be necessary to keep your foot or leg elevated for several weeks.  If it hurts, it should be elevated.  Follow Up Call my office at [phone] when you are discharged from the hospital or surgery center to schedule an appointment to be seen two weeks  after surgery.  Call my office at [phone] if you develop a fever >101.5 F, nausea, vomiting, bleeding from the surgical site or severe pain.

## 2017-07-22 NOTE — Evaluation (Signed)
Occupational Therapy Evaluation Patient Details Name: Justin Stone MRN: [id number] DOB: 09/09/1960 Today's Date: 07/22/2017    History of Present Illness 56 y.o. male with medical history significant of DM2, s/p prior R BKA and currently post op L BKA 07/21/17   Clinical Impression   This 56 y/o M presents with the above. Pt lives alone, reports at baseline is mod independent with ADLs and functional mobility. Pt presenting with decreased activity tolerance, generalized weakness, decreased functional performance. Pt completed lateral scoot transfer from EOB to drop arm recliner with MinA +2, requiring increased time and effort to complete. Pt will benefit from continued acute OT services and recommend CIR level services after discharge to maximize Pt's safety and independence with ADLs and mobility.     Follow Up Recommendations  CIR;Supervision/Assistance - 24 hour    Equipment Recommendations  Other (comment)(defer to next venue )           Precautions / Restrictions Precautions Precautions: Fall Restrictions Weight Bearing Restrictions: Yes LLE Weight Bearing: Non weight bearing Other Position/Activity Restrictions: has R prosthetic leg however did not feel able to stand today      Mobility Bed Mobility Overal bed mobility: Needs Assistance Bed Mobility: Supine to Sit;Rolling Rolling: Mod assist   Supine to sit: Min guard;HOB elevated     General bed mobility comments: increased time and effort, pt on bed pan on arrival, mod assist for rolling at hips to complete turn, cues for selfing assisting to EOB, provided a hand for pt to pull trunk upright  Transfers Overall transfer level: Needs assistance   Transfers: Lateral/Scoot Transfers          Lateral/Scoot Transfers: Min assist;From elevated surface General transfer comment: increased time and effort, max verbal cues for technique and weight shifting, blocked knees to prevent forward sliding, utilized bed pad  to assist with scoot at times    Balance Overall balance assessment: Needs assistance Sitting-balance support: Feet unsupported;No upper extremity supported Sitting balance-Leahy Scale: Fair                                     ADL either performed or assessed with clinical judgement   ADL Overall ADL's : Needs assistance/impaired Eating/Feeding: Set up;Sitting   Grooming: Set up;Sitting Grooming Details (indicate cue type and reason): supported sitting  Upper Body Bathing: Sitting;Minimal assistance   Lower Body Bathing: Maximal assistance;Sitting/lateral leans;+2 for safety/equipment   Upper Body Dressing : Set up;Sitting   Lower Body Dressing: Maximal assistance;+2 for safety/equipment;Sitting/lateral leans   Toilet Transfer: Moderate assistance;Requires drop arm;Requires wide/bariatric;Transfer Regulatory affairs officerboard Toilet Transfer Details (indicate cue type and reason): simulated in bed to chair transfer lateral scoot  Toileting- Clothing Manipulation and Hygiene: Maximal assistance;Sitting/lateral lean       Functional mobility during ADLs: Minimal assistance;+2 for safety/equipment(lateral scoot transfers )                           Pertinent Vitals/Pain Pain Assessment: No/denies pain          Extremity/Trunk Assessment Upper Extremity Assessment Upper Extremity Assessment: Overall WFL for tasks assessed   Lower Extremity Assessment Lower Extremity Assessment: Defer to PT evaluation RLE Deficits / Details: hx R BKA RLE Sensation: history of peripheral neuropathy LLE Deficits / Details: full knee extension observed, able to perform at least 90* knee flexion LLE Sensation: history of peripheral neuropathy  Cervical / Trunk Assessment Cervical / Trunk Assessment: Other exceptions Cervical / Trunk Exceptions: significant abdominal habitus limiting mobility   Communication Communication Communication: No difficulties   Cognition Arousal/Alertness:  Awake/alert Behavior During Therapy: WFL for tasks assessed/performed Overall Cognitive Status: Within Functional Limits for tasks assessed                                 General Comments: follows commands however poor self awareness   General Comments                  Home Living Family/patient expects to be discharged to:: Private residence Living Arrangements: Alone     Home Access: Level entry     Home Layout: One level               Home Equipment: Cane - single point          Prior Functioning/Environment Level of Independence: Independent with assistive device(s)                 OT Problem List: Decreased strength;Impaired balance (sitting and/or standing);Decreased activity tolerance;Decreased knowledge of use of DME or AE;Obesity      OT Treatment/Interventions: Self-care/ADL training;DME and/or AE instruction;Therapeutic activities;Balance training;Therapeutic exercise;Energy conservation;Patient/family education    OT Goals(Current goals can be found in the care plan section) Acute Rehab OT Goals Patient Stated Goal: regain independence  OT Goal Formulation: With patient Time For Goal Achievement: 08/04/17 Potential to Achieve Goals: Good  OT Frequency: Min 2X/week               Co-evaluation PT/OT/SLP Co-Evaluation/Treatment: Yes Reason for Co-Treatment: For patient/therapist safety PT goals addressed during session: Mobility/safety with mobility OT goals addressed during session: ADL's and self-care      AM-PAC PT "6 Clicks" Daily Activity     Outcome Measure Help from another person eating meals?: None Help from another person taking care of personal grooming?: A Little Help from another person toileting, which includes using toliet, bedpan, or urinal?: A Lot Help from another person bathing (including washing, rinsing, drying)?: A Lot Help from another person to put on and taking off regular upper body clothing?:  None Help from another person to put on and taking off regular lower body clothing?: A Lot 6 Click Score: 17   End of Session Nurse Communication: Mobility status;Need for lift equipment  Activity Tolerance: Patient tolerated treatment well Patient left: in chair;with call bell/phone within reach;with family/visitor present  OT Visit Diagnosis: Other abnormalities of gait and mobility (R26.89)                Time: 1610-96041104-1134 OT Time Calculation (min): 30 min Charges:  OT General Charges $OT Visit: 1 Visit OT Evaluation $OT Eval Moderate Complexity: 1 Mod G-Codes:     Marcy SirenBreanna Verenis [place], OT Pager [phone] 07/22/2017   Orlando PennerBreanna L Elsi Stelzer 07/22/2017, 12:49 PM

## 2017-07-22 NOTE — Evaluation (Signed)
Physical Therapy Evaluation Patient Details Name: Justin Stone MRN: [id number] DOB: Jul 27, 1960 Today's Date: 07/22/2017   History of Present Illness  56 y.o. male with medical history significant of DM2, s/p prior R BKA and currently post op L BKA 07/21/17  Clinical Impression  Patient is s/p above surgery resulting in functional limitations due to the deficits listed below (see PT Problem List).  Patient will benefit from skilled PT to increase their independence and safety with mobility to allow discharge to the venue listed below.  Pt significantly limited with mobility at this time.  Pt typically using R LE prosthesis (hx R BKA) however did not wish to attempt to use today (felt too weak).  Pt educated on lateral/scoot transfers however requires more education.  Pt lives alone and will likely require physical assist upon d/c for mobility so recommending f/u PT at this time.  If CIR unavailable, then SNF.     Follow Up Recommendations CIR    Equipment Recommendations  Wheelchair cushion (measurements PT);Wheelchair (measurements PT);Hospital bed    Recommendations for Other Services       Precautions / Restrictions Precautions Precautions: Fall Restrictions Weight Bearing Restrictions: Yes LLE Weight Bearing: Non weight bearing Other Position/Activity Restrictions: has R prosthetic leg however did not feel able to stand today      Mobility  Bed Mobility Overal bed mobility: Needs Assistance Bed Mobility: Supine to Sit;Rolling Rolling: Mod assist   Supine to sit: Min guard;HOB elevated     General bed mobility comments: increased time and effort, pt on bed pan on arrival, mod assist for rolling at hips to complete turn, cues for selfing assisting to EOB, provided a hand for pt to pull trunk upright  Transfers Overall transfer level: Needs assistance   Transfers: Lateral/Scoot Transfers          Lateral/Scoot Transfers: Min assist;From elevated surface General  transfer comment: increased time and effort, max verbal cues for technique and weight shifting, blocked knees to prevent forward sliding, utilized bed pad to assist with scoot at times  Ambulation/Gait                Stairs            Wheelchair Mobility    Modified Rankin (Stroke Patients Only)       Balance Overall balance assessment: Needs assistance Sitting-balance support: Feet unsupported;No upper extremity supported Sitting balance-Leahy Scale: Fair                                       Pertinent Vitals/Pain Pain Assessment: No/denies pain    Home Living Family/patient expects to be discharged to:: Private residence Living Arrangements: Alone     Home Access: Level entry     Home Layout: One level Home Equipment: Cane - single point      Prior Function Level of Independence: Independent with assistive device(s)               Hand Dominance        Extremity/Trunk Assessment   Upper Extremity Assessment Upper Extremity Assessment: Overall WFL for tasks assessed    Lower Extremity Assessment Lower Extremity Assessment: Defer to PT evaluation RLE Deficits / Details: hx R BKA RLE Sensation: history of peripheral neuropathy LLE Deficits / Details: full knee extension observed, able to perform at least 90* knee flexion LLE Sensation: history of peripheral neuropathy  Cervical / Trunk Assessment Cervical / Trunk Assessment: Other exceptions Cervical / Trunk Exceptions: significant abdominal habitus limiting mobility  Communication   Communication: No difficulties  Cognition Arousal/Alertness: Awake/alert Behavior During Therapy: WFL for tasks assessed/performed Overall Cognitive Status: Within Functional Limits for tasks assessed                                 General Comments: follows commands however poor self awareness      General Comments      Exercises     Assessment/Plan    PT  Assessment Patient needs continued PT services  PT Problem List Decreased strength;Decreased mobility;Decreased knowledge of precautions;Impaired sensation;Decreased knowledge of use of DME;Decreased balance;Decreased activity tolerance;Obesity       PT Treatment Interventions DME instruction;Therapeutic activities;Therapeutic exercise;Patient/family education;Wheelchair mobility training;Balance training;Functional mobility training    PT Goals (Current goals can be found in the Care Plan section)  Acute Rehab PT Goals PT Goal Formulation: With patient Time For Goal Achievement: 07/29/17 Potential to Achieve Goals: Good    Frequency Min 3X/week   Barriers to discharge        Co-evaluation PT/OT/SLP Co-Evaluation/Treatment: Yes Reason for Co-Treatment: For patient/therapist safety PT goals addressed during session: Mobility/safety with mobility OT goals addressed during session: ADL's and self-care       AM-PAC PT "6 Clicks" Daily Activity  Outcome Measure Difficulty turning over in bed (including adjusting bedclothes, sheets and blankets)?: Unable Difficulty moving from lying on back to sitting on the side of the bed? : Unable Difficulty sitting down on and standing up from a chair with arms (e.g., wheelchair, bedside commode, etc,.)?: Unable Help needed moving to and from a bed to chair (including a wheelchair)?: A Lot Help needed walking in hospital room?: Total Help needed climbing 3-5 steps with a railing? : Total 6 Click Score: 7    End of Session   Activity Tolerance: Patient tolerated treatment well Patient left: in chair;with family/visitor present;with call bell/phone within reach Nurse Communication: Mobility status PT Visit Diagnosis: Other abnormalities of gait and mobility (R26.89)    Time: [id number] PT Time Calculation (min) (ACUTE ONLY): 29 min   Charges:   PT Evaluation $PT Eval Moderate Complexity: 1 Mod     PT G CodesZenovia Jarred:        Kati Ayush Boulet, PT,  DPT 07/22/2017 Pager: [phone]  Maida SaleLEMYRE,KATHrine E 07/22/2017, 12:38 PM

## 2017-07-22 NOTE — Progress Notes (Signed)
PROGRESS NOTE  Justin Stone WUJ:[id number] DOB: 1961/04/23 DOA: 07/21/2017 PCP: Pearline Cablesopland, Jessica C, MD  Brief History:  56 year old male with a history of diabetes mellitus, hypertension, hyperlipidemia, chronic pain presenting from his primary care provider's office on 07/21/2017 secondary to a necrotic left foot wound.  Unfortunately, the patient is a poor historian and is unable to provide chronicity of events.  According to him, he has had a wound on his left foot since October 2018.  He cannot clarify whether he has been on any antibiotics or how the wound came about, although he states that he has had previously followed up with his primary care provider regarding a foot wound.  Apparently, the patient most recently saw his primary care provider on July 06, 2017 for a fall, but he did not mention anything about his left foot wound.  Nevertheless, the patient had a gangrenous left foot when evaluated in the emergency department.  Orthopedics, Dr. Victorino DikeHewitt was consulted who felt that the foot was not salvageable.  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  Assessment/Plan: Gangrene left foot -Appreciate orthopedics -Left BKA--07/21/2017 wound care per orthopedics -Can likely discontinue antibiotics after today's dose -PT eval-->CIR  Acute kidney injury -Volume depletion  -improved with fluid resuscitation -Holding furosemide temporarily -Baseline creatinine 1.0-1.2 -Presenting creatinine 1.55 -Holding lisinopril  Chronic pain syndrome -Patient has pain management contract with his primary care provider, Dr. Warner MccreedyJessica Copeland -Patient will likely need higher doses of opioids secondary to left BKA and recent fall -Continue gabapentin -Continue home dose of MS Contin -oxyIR 10 mg q 3 hours prn pain  Essential hypertension -Continue amlodipine  Diabetes mellitus type 2 -04/18/2017 hemoglobin A1c 4.5 -Holding metformin -NovoLog sliding scale -recheck A1C  Sinus  tachycardia -Likely due to pain -Personally reviewed EKG--sinus tachycardia, nonspecific T wave change -TSH     Disposition Plan:   CIR 1-2 days  Family Communication:   Brother updated at bedside 12/28--Total time spent 35 minutes.  Greater than 50% spent face to face counseling and coordinating care.   Consultants:  Ortho--Dr. Victorino DikeHewitt  Code Status:  FULL /  DVT Prophylaxis:  [place] Lovenox   Procedures: As Listed in Progress Note Above  Antibiotics: vanco 12/27>>>12/28 Ceftriaxone 12/27>>>12/28    Subjective: Patient complains of pain in his left stump.  He denies any chest pain, shortness of breath, nausea, vomiting, diarrhea, abdominal pain, dysuria, hematuria.  Objective: Vitals:   07/22/17 0200 07/22/17 0529 07/22/17 1000 07/22/17 1049  BP: (!) 142/58 (!) 127/54 (!) 153/98 (!) 157/75  Pulse: (!) 101 (!) 105  (!) 115  Resp: 19 20  20   Temp: 98.6 F (37 C) 98.3 F (36.8 C)  99.4 F (37.4 C)  TempSrc: Oral Oral  Oral  SpO2: 100% 100%  90%  Weight:      Height:        Intake/Output Summary (Last 24 hours) at 07/22/2017 1312 Last data filed at 07/22/2017 1036 Gross per 24 hour  Intake 2550 ml  Output 575 ml  Net 1975 ml   Weight change:  Exam:   General:  Pt is alert, follows commands appropriately, not in acute distress  HEENT: No icterus, No thrush, No neck mass, [place]/AT  Cardiovascular: RRR, S1/S2, no rubs, no gallops  Respiratory: Bibasilar crackles.  No wheezing.  Good air movement.  Abdomen: Soft/+BS, non tender, non distended, no guarding  Extremities: Right BKA site without erythema or drainage.  Left BKA site with  bulky bandage.   Data Reviewed: I have personally reviewed following labs and imaging studies Basic Metabolic Panel: Recent Labs  Lab 07/21/17 1229 07/22/17 0529  NA 128* 131*  K 3.9 4.0  CL 96* 100*  CO2 19* 23  GLUCOSE 165* 172*  BUN 33* 24*  CREATININE 1.55* 1.26*  CALCIUM 9.4 9.0   Liver Function Tests: No  results for input(s): AST, ALT, ALKPHOS, BILITOT, PROT, ALBUMIN in the last 168 hours. No results for input(s): LIPASE, AMYLASE in the last 168 hours. No results for input(s): AMMONIA in the last 168 hours. Coagulation Profile: No results for input(s): INR, PROTIME in the last 168 hours. CBC: Recent Labs  Lab 07/21/17 1305 07/22/17 0529  WBC 17.7* 16.8*  NEUTROABS 14.4*  --   HGB 10.5* 9.6*  HCT 32.1* 29.7*  MCV 95.5 97.1  PLT 264 238   Cardiac Enzymes: No results for input(s): CKTOTAL, CKMB, CKMBINDEX, TROPONINI in the last 168 hours. BNP: Invalid input(s): POCBNP CBG: Recent Labs  Lab 07/21/17 1202 07/21/17 1942 07/21/17 2158 07/22/17 0726 07/22/17 1151  GLUCAP 163* 156* 146* 167* 161*   HbA1C: No results for input(s): HGBA1C in the last 72 hours. Urine analysis:    Component Value Date/Time   COLORURINE YELLOW 11/04/2015 1155   APPEARANCEUR CLEAR 11/04/2015 1155   LABSPEC 1.025 11/04/2015 1155   PHURINE 5.5 11/04/2015 1155   GLUCOSEU >1000 (A) 11/04/2015 1155   GLUCOSEU >=1000 (A) 10/30/2015 1040   HGBUR NEGATIVE 11/04/2015 1155   BILIRUBINUR NEGATIVE 11/04/2015 1155   KETONESUR NEGATIVE 11/04/2015 1155   PROTEINUR NEGATIVE 11/04/2015 1155   UROBILINOGEN 0.2 10/30/2015 1040   NITRITE NEGATIVE 11/04/2015 1155   LEUKOCYTESUR NEGATIVE 11/04/2015 1155   Sepsis Labs: @LABRCNTIP (procalcitonin:4,lacticidven:4) )No results found for this or any previous visit (from the past 240 hour(s)).   Scheduled Meds: . amLODipine  10 mg Oral Daily  . aspirin EC  81 mg Oral Daily  . carisoprodol  350 mg Oral TID  . docusate sodium  100 mg Oral BID  . enoxaparin (LOVENOX) injection  40 mg Subcutaneous Q24H  . ferrous sulfate  325 mg Oral BID WC  . gabapentin  1,200 mg Oral TID  . insulin aspart  0-15 Units Subcutaneous TID WC  . ketotifen  1 drop Both Eyes BID  . morphine  15 mg Oral Q12H  . senna  1 tablet Oral BID  . simvastatin  10 mg Oral QHS  . venlafaxine  75 mg  Oral BID WC   Continuous Infusions: . sodium chloride    . methocarbamol (ROBAXIN)  IV 500 mg (07/21/17 2010)    Procedures/Studies: Dg Chest 2 View  Result Date: 07/06/2017 CLINICAL DATA:  Fall 4 days ago.  Sudden left-sided rib pain. EXAM: CHEST  2 VIEW COMPARISON:  One-view chest x-ray 02/11/2015 FINDINGS: The heart size is exaggerated by low lung volumes. dextroconvex scoliosis is present in the lower thoracic spine. Interstitial and airspace disease is present at the lung bases bilaterally. There is no acute or healing rib fracture. There is no pneumothorax. IMPRESSION: 1. Bilateral interstitial and airspace disease. While this may reflect atelectasis, infection is not excluded. 2. No acute or healing rib fracture. 3. Stable scoliosis. Electronically Signed   By: Marin Roberts M.D.   On: 07/06/2017 13:36   Dg Ribs Unilateral Left  Result Date: 07/06/2017 CLINICAL DATA:  Fall 12 days ago.  Left-sided pain since. EXAM: LEFT RIBS - 2 VIEW COMPARISON:  Two-view chest x-ray from the same  day. One-view chest x-ray 02/11/2015. FINDINGS: No acute or healing left-sided rib fractures are present. There is no pneumothorax. Left hemithorax is clear. 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 IMPRESSION: 1. No acute or healing left rib fractures. 2. Chronic remote left AC separation. Electronically Signed   By: Marin Robertshristopher  Mattern M.D.   On: 07/06/2017 13:35   Dg Foot Complete Left  Result Date: 07/21/2017 CLINICAL DATA:  Left foot and infection. Decreased range of motion. History of cirrhosis, diabetes, peripheral vascular disease with below the knee amputation on the right, former smoker. EXAM: LEFT FOOT - COMPLETE 3+ VIEW COMPARISON:  None in PACs FINDINGS: There is markedly abnormal appearance of the soft tissues overlying the fifth MTP joint. Gas is present within the soft tissues. There is deformity of the head of the fifth metatarsal. The base of the  proximal phalanx cannot be well evaluated. The distal phalanx appears normal. The first through fourth rays exhibit no acute abnormalities. There is degenerative change of the first MTP joint. The tarsometatarsal and intertarsal joints appear normal. There are plantar and Achilles region calcaneal spurs. There are arterial calcifications. IMPRESSION: Findings consistent with cellulitis and osteomyelitis of the distal aspect of the fifth metatarsal. Involvement of the base of the proximal phalanx may be present. There is a fracture through the head of the fifth metatarsal that may be pathologic or post traumatic. Electronically Signed   By: Lucyann Romano  [place] M.D.   On: 07/21/2017 13:07    Catarina Hartshornavid Cassadie Pankonin, DO  [facility] Pager [phone]  If 7PM-7AM, please contact night-coverage [email or web address] Password [email or web address] 07/22/2017, 1:12 PM   LOS: 1 day

## 2017-07-22 NOTE — ED Provider Notes (Signed)
  Physical Exam  BP (!) 144/79 (BP Location: Left Arm)   Pulse 100   Temp 97.8 F (36.6 C)   Resp 20   Ht 5\' 8"  (1.727 m)   Wt 113.4 kg (250 lb)   SpO2 94%   BMI 38.01 kg/m   Briefly, patient was transferred from [facility] for gangrenous diabetic foot ulcer.  Per patient, this infection is been occurring for an unknown amount of time.  Patient transferred for surgical debridement and amputation by Dr. Victorino DikeHewitt.  Physical Exam  Constitutional: He appears well-developed and well-nourished. No distress.  Sitting comfortably in bed.  HENT:  Head: Normocephalic and atraumatic.  Eyes: Conjunctivae are normal. Right eye exhibits no discharge. Left eye exhibits no discharge.  EOMs normal to gross examination.  Neck: Normal range of motion.  Cardiovascular: Regular rhythm.  Intact, 2+ radial pulse.  Patient borderline tachycardic on my examination.  Pulmonary/Chest:  Normal respiratory effort. Patient converses comfortably. No audible wheeze or stridor.  Abdominal: He exhibits no distension.  Musculoskeletal: Normal range of motion.  Neurological: He is alert.  Cranial nerves intact to gross observation. Patient moves extremities symmetrically. Skin: He is not diaphoretic.  See clinical photo.  Patient exhibits necrotic tissue of the third, fourth, and fifth digits of the left lower extremity.  There is also necrotic tissue of the lateral plantar aspect of the left foot.  Psychiatric: He has a normal mood and affect. His behavior is normal. Judgment and thought content normal.  Nursing note and vitals reviewed.        ED Course/Procedures   Clinical Course as of Jul 22 26  Thu Jul 21, 2017  1457 Discussed with Dr Victorino DikeHewitt, orthopedic surgery. Available currently and would like to operate on him this afternoon/evening. Pt last ate/drank last night. Requested keep NPO and no blood thinners prior to his evaluation. Going to be ED-to-ED transfer to try and expedite.  [facility]. Tentative plan for Dr Victorino DikeHewitt to see and then take to OR. Previously discussed with Dr. Vania Rea David for admission who requested verification of surgical availability prior to transfer and to notify her again via [facility]. [facility] was notified of plan.   [SK]    Clinical Course User Index [SK] Raeford RazorKohut, Stephen, MD    Procedures  MDM   On evaluation, patient nontoxic-appearing but uncomfortable.  Pain control ordered for patient prior to surgery.  Proceeded to page Dr. Victorino DikeHewitt to let him know that patient has arrived.  Patient updated on course that he will speak with Dr. Victorino DikeHewitt in preop.  Patient aware that he is going to surgery.      Elisha PonderMurray, Mikinzie Maciejewski B, PA-C 07/22/17 0030    Arby BarrettePfeiffer, Marcy, MD 07/23/17 1012

## 2017-07-22 NOTE — Progress Notes (Signed)
Subjective: 1 Day Post-Op Procedure(s) (LRB): LEFT  BELOW KNEE AMPUTATION (Left)  Patient reports pain as mild to moderate.  Tolerating POs well.  Admits to flatus.  Denies fever, chills, N/V, SOB, CP.  Reports that he would like to go home.  Objective:   VITALS:  Temp:  [97.8 F (36.6 C)-98.6 F (37 C)] 98.3 F (36.8 C) (12/28 0529) Pulse Rate:  [97-107] 105 (12/28 0529) Resp:  [16-35] 20 (12/28 0529) BP: (115-157)/(51-98) 127/54 (12/28 0529) SpO2:  [94 %-100 %] 100 % (12/28 0529) Weight:  [113.4 kg (250 lb)-113.8 kg (250 lb 12.8 oz)] 113.4 kg (250 lb) (12/27 1155)  General: WDWN patient in NAD. Psych:  Appropriate mood and affect. Neuro:  A&O x 3, Moving all extremities, sensation intact to light touch HEENT:  EOMs intact Chest:  Even non-labored respirations Skin:  Dressing C/D/I, no rashes or lesions Extremities: warm/dry, mild edema, no erythema or echymosis.  No lymphadenopathy. Pulses: femoral 2+ MSK:  ROM: TKE, MMT: able to perform quad set    LABS Recent Labs    07/21/17 1305 07/22/17 0529  HGB 10.5* 9.6*  WBC 17.7* 16.8*  PLT 264 238   Recent Labs    07/21/17 1229 07/22/17 0529  NA 128* 131*  K 3.9 4.0  CL 96* 100*  CO2 19* 23  BUN 33* 24*  CREATININE 1.55* 1.26*  GLUCOSE 165* 172*   No results for input(s): LABPT, INR in the last 72 hours.   Assessment/Plan: 1 Day Post-Op Procedure(s) (LRB): LEFT  BELOW KNEE AMPUTATION (Left)  NWB L LE Stump protector from [employer] ordered Up with therapy Plan for 2 week outpatient post-op visit with Dr. Reed BreechHewitt  Justin Ollis, PA-C [facility] Office:  [phone]

## 2017-07-23 DIAGNOSIS — E1152 Type 2 diabetes mellitus with diabetic peripheral angiopathy with gangrene: Principal | ICD-10-CM

## 2017-07-23 LAB — CBC
HCT: 31.5 % — ABNORMAL LOW (ref 39.0–52.0)
Hemoglobin: 10.2 g/dL — ABNORMAL LOW (ref 13.0–17.0)
MCH: 31.7 pg (ref 26.0–34.0)
MCHC: 32.4 g/dL (ref 30.0–36.0)
MCV: 97.8 fL (ref 78.0–100.0)
Platelets: 294 K/uL (ref 150–400)
RBC: 3.22 MIL/uL — ABNORMAL LOW (ref 4.22–5.81)
RDW: 14.4 % (ref 11.5–15.5)
WBC: 16 K/uL — ABNORMAL HIGH (ref 4.0–10.5)

## 2017-07-23 LAB — BASIC METABOLIC PANEL
Anion gap: 10 (ref 5–15)
BUN: 19 mg/dL (ref 6–20)
CHLORIDE: 99 mmol/L — AB (ref 101–111)
CO2: 22 mmol/L (ref 22–32)
CREATININE: 1.08 mg/dL (ref 0.61–1.24)
Calcium: 8.8 mg/dL — ABNORMAL LOW (ref 8.9–10.3)
GFR calc Af Amer: >60 mL/min (ref >60)
GFR calc non Af Amer: >60 mL/min (ref >60)
GLUCOSE: 133 mg/dL — AB (ref 65–99)
Potassium: 4.3 mmol/L (ref 3.5–5.1)
Sodium: 131 mmol/L — ABNORMAL LOW (ref 135–145)

## 2017-07-23 LAB — GLUCOSE, CAPILLARY
Glucose-Capillary: 125 mg/dL — ABNORMAL HIGH (ref 65–99)
Glucose-Capillary: 135 mg/dL — ABNORMAL HIGH (ref 65–99)
Glucose-Capillary: 135 mg/dL — ABNORMAL HIGH (ref 65–99)
Glucose-Capillary: 133 mg/dL — ABNORMAL HIGH (ref 65–99)

## 2017-07-23 LAB — HEMOGLOBIN A1C
HEMOGLOBIN A1C: 6.5 % — AB (ref 4.8–5.6)
Mean Plasma Glucose: 140 mg/dL

## 2017-07-23 MED ORDER — BISACODYL 10 MG RE SUPP: 10.0000 mg | Freq: Once | RECTAL | Status: DC

## 2017-07-23 NOTE — Progress Notes (Signed)
Subjective: 2 Days Post-Op Procedure(s) (LRB): LEFT  BELOW KNEE AMPUTATION (Left) Patient reports pain as well controlled.Toelrating PO's . Denies CP and SOB.    Objective: Vital signs in last 24 hours: Temp:  [98.4 F (36.9 C)-99.4 F (37.4 C)] 98.6 F (37 C) (12/29 0522) Pulse Rate:  [109-115] 110 (12/29 0522) Resp:  [20] 20 (12/29 0522) BP: (151-161)/(70-98) 161/73 (12/29 0522) SpO2:  [90 %-95 %] 93 % (12/29 0522)  Intake/Output from previous day: 12/28 0701 - 12/29 0700 In: 30 [I.V.:30] Out: 1275 [Urine:1275] Intake/Output this shift: No intake/output data recorded.  Recent Labs    07/21/17 1305 07/22/17 0529 07/23/17 0558  HGB 10.5* 9.6* 10.2*   Recent Labs    07/22/17 0529 07/23/17 0558  WBC 16.8* 16.0*  RBC 3.06* 3.22*  HCT 29.7* 31.5*  PLT 238 294   Recent Labs    07/22/17 0529 07/23/17 0558  NA 131* 131*  K 4.0 4.3  CL 100* 99*  CO2 23 22  BUN 24* 19  CREATININE 1.26* 1.08  GLUCOSE 172* 133*  CALCIUM 9.0 8.8*   No results for input(s): LABPT, INR in the last 72 hours.  Alert and oriented x3. RRR, Lungs clear, BS x4.L knee dressing C/D/I. No DVT signs. No signs of infection or compartment syndrome.  Assessment/Plan: 2 Days Post-Op Procedure(s) (LRB): LEFT  BELOW KNEE AMPUTATION (Left) Plan D/c to SNF Continue current care Pain management  STILWELL, BRYSON L 07/23/2017, 8:44 AM

## 2017-07-23 NOTE — Progress Notes (Signed)
[facility]  PROGRESS NOTE  Justin Stone WGN:[id number] DOB: Nov 03, 1960 DOA: 07/21/2017 PCP: Pearline Cablesopland, Jessica C, MD   Brief HPI:    56 year old male with a history of diabetes mellitus, hypertension, hyperlipidemia, chronic pain presenting from his primary care provider's office on 07/21/2017 secondary to a necrotic left foot wound.  Unfortunately, the patient is a poor historian and is unable to provide chronicity of events.  According to him, he has had a wound on his left foot since October 2018.  He cannot clarify whether he has been on any antibiotics or how the wound came about, although he states that he has had previously followed up with his primary care provider regarding a foot wound.  Apparently, the patient most recently saw his primary care provider on July 06, 2017 for a fall, but he did not mention anything about his left foot wound.  Nevertheless, the patient had a gangrenous left foot when evaluated in the emergency department.  Orthopedics, Dr. Victorino DikeHewitt was consulted who felt that the foot was not salvageable.  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      Subjective   Patient seen and examined, complains of pain.   Assessment/Plan:     1. Gangrene left foot, status post left BKA. Pediatrics consulted. Recommend CIR 2. Acute kidney injury-improved, baseline creatinine 1.0, came with creatinine 1.55. Lisinopril on hold. Started on IV fluids. Today creatinine is 1.08. 3. Hypertension-continue amlodipine 4. Diabetes mellitus- continue sliding scale insulin with NovoLog. 5. Chronic pain syndrome- continue MS Contin, OxyIR, gabapentin     DVT prophylaxis: Lovenox  Code Status: Full code  Family Communication: No family present at bedside  Disposition Plan: Skilled nursing facility   Consultants:  Orthopedics  Procedures:  Left BKA  Continuous infusions . methocarbamol (ROBAXIN)  IV 500 mg (07/21/17 2010)      Antibiotics:    Anti-infectives (From admission, onward)   Start     Dose/Rate Route Frequency Ordered Stop   07/22/17 1300  cefTRIAXone (ROCEPHIN) 2 g in dextrose 5 % 50 mL IVPB     2 g 100 mL/hr over 30 Minutes Intravenous  Once 07/21/17 2257 07/22/17 1301   07/22/17 0200  vancomycin (VANCOCIN) IVPB 750 mg/150 ml premix  Status:  Discontinued     750 mg 150 mL/hr over 60 Minutes Intravenous Every 12 hours 07/21/17 1439 07/21/17 2057   07/22/17 0100  vancomycin (VANCOCIN) IVPB 1000 mg/200 mL premix     1,000 mg 200 mL/hr over 60 Minutes Intravenous Every 12 hours 07/21/17 2057 07/22/17 0215   07/21/17 1741  ceFAZolin (ANCEF) 2-4 GM/100ML-% IVPB    Comments:  Anastasio ChampionEvans, Janet   : cabinet override      07/21/17 1741 07/22/17 0544   07/21/17 1323  vancomycin (VANCOCIN) 500 MG powder    Comments:  Abran DukeEggleston, Amanda   : cabinet override      07/21/17 1323 07/22/17 0129   07/21/17 1300  vancomycin (VANCOCIN) 2,500 mg in sodium chloride 0.9 % 500 mL IVPB     2,500 mg 250 mL/hr over 120 Minutes Intravenous  Once 07/21/17 1218 07/21/17 1558   07/21/17 1300  cefTRIAXone (ROCEPHIN) 2 g in dextrose 5 % 50 mL IVPB     2 g 100 mL/hr over 30 Minutes Intravenous  Once 07/21/17 1219 07/21/17 1430   07/21/17 1215  cefTRIAXone (ROCEPHIN) 1 g in dextrose 5 % 50 mL IVPB  Status:  Discontinued     1 g 100 mL/hr over 30 Minutes Intravenous  Once 07/21/17 1204 07/21/17 1219       Objective   Vitals:   07/22/17 1348 07/22/17 2145 07/23/17 0522 07/23/17 1431  BP: (!) 151/70 (!) 154/80 (!) 161/73 (!) 143/78  Pulse: (!) 109 (!) 109 (!) 110 (!) 106  Resp: 20 20 20 16   Temp: 98.6 F (37 C) 98.4 F (36.9 C) 98.6 F (37 C) 98.2 F (36.8 C)  TempSrc: Oral Oral Oral Oral  SpO2: 90% 95% 93% 95%  Weight:      Height:        Intake/Output Summary (Last 24 hours) at 07/23/2017 1645 Last data filed at 07/23/2017 1500 Gross per 24 hour  Intake 415 ml  Output 800 ml  Net -385 ml   Filed Weights   07/21/17 1155   Weight: 113.4 kg (250 lb)     Physical Examination:  Physical Exam: Eyes: No icterus, extraocular muscles intact  Mouth: Oral mucosa is moist, no lesions on palate,  Neck: Supple, no deformities, masses, or tenderness Lungs: Normal respiratory effort, bilateral clear to auscultation, no crackles or wheezes.  Heart: Regular rate and rhythm, S1 and S2 normal, no murmurs, rubs auscultated Abdomen: BS normoactive,soft,nondistended,non-tender to palpation,no organomegaly Extremities: s/p left BKA, also right bka Neuro : Alert and oriented to time, place and person, No focal deficits      Data Reviewed: I have personally reviewed following labs and imaging studies  CBG: Recent Labs  Lab 07/22/17 1151 07/22/17 1702 07/22/17 2141 07/23/17 0731 07/23/17 1210  GLUCAP 161* 146* 140* 125* 135*    CBC: Recent Labs  Lab 07/21/17 1305 07/22/17 0529 07/23/17 0558  WBC 17.7* 16.8* 16.0*  NEUTROABS 14.4*  --   --   HGB 10.5* 9.6* 10.2*  HCT 32.1* 29.7* 31.5*  MCV 95.5 97.1 97.8  PLT 264 238 294    Basic Metabolic Panel: Recent Labs  Lab 07/21/17 1229 07/22/17 0529 07/23/17 0558  NA 128* 131* 131*  K 3.9 4.0 4.3  CL 96* 100* 99*  CO2 19* 23 22  GLUCOSE 165* 172* 133*  BUN 33* 24* 19  CREATININE 1.55* 1.26* 1.08  CALCIUM 9.4 9.0 8.8*    No results found for this or any previous visit (from the past 240 hour(s)).   Liver Function Tests: No results for input(s): AST, ALT, ALKPHOS, BILITOT, PROT, ALBUMIN in the last 168 hours. No results for input(s): LIPASE, AMYLASE in the last 168 hours. No results for input(s): AMMONIA in the last 168 hours.  Cardiac Enzymes: No results for input(s): CKTOTAL, CKMB, CKMBINDEX, TROPONINI in the last 168 hours. BNP (last 3 results) No results for input(s): BNP in the last 8760 hours.  ProBNP (last 3 results) No results for input(s): PROBNP in the last 8760 hours.    Studies: No results found.  Scheduled Meds: .  amLODipine  10 mg Oral Daily  . aspirin EC  81 mg Oral Daily  . bisacodyl  10 mg Rectal Once  . carisoprodol  350 mg Oral TID  . docusate sodium  100 mg Oral BID  . enoxaparin (LOVENOX) injection  40 mg Subcutaneous Q24H  . ferrous sulfate  325 mg Oral BID WC  . gabapentin  1,200 mg Oral TID  . insulin aspart  0-15 Units Subcutaneous TID WC  . ketotifen  1 drop Both Eyes BID  . morphine  15 mg Oral Q12H  . senna  1 tablet Oral BID  . simvastatin  10 mg Oral QHS  . venlafaxine  75  mg Oral BID WC      Time spent: 25 min  Meredeth IdeGagan S Dosia Yodice   [facility] Pager [phone]. If 7PM-7AM, please contact night-coverage at [email or web address], Office  [phone]  password [email or web address]  07/23/2017, 4:45 PM  LOS: 2 days

## 2017-07-24 LAB — GLUCOSE, CAPILLARY
GLUCOSE-CAPILLARY: 154 mg/dL — AB (ref 65–99)
GLUCOSE-CAPILLARY: 123 mg/dL — AB (ref 65–99)
GLUCOSE-CAPILLARY: 117 mg/dL — AB (ref 65–99)
Glucose-Capillary: 121 mg/dL — ABNORMAL HIGH (ref 65–99)

## 2017-07-24 NOTE — Progress Notes (Signed)
Subjective: 3 Days Post-Op Procedure(s) (LRB): LEFT  BELOW KNEE AMPUTATION (Left) Patient reports pain as 4 on 0-10 scale.  Wants med schedule changed  Objective: Vital signs in last 24 hours: Temp:  [98 F (36.7 C)-98.2 F (36.8 C)] 98 F (36.7 C) (12/30 0636) Pulse Rate:  [104-108] 104 (12/30 0636) Resp:  [16-18] 18 (12/30 0636) BP: (143-175)/(78-91) 162/89 (12/30 0636) SpO2:  [95 %-96 %] 95 % (12/30 0636)  Intake/Output from previous day: 12/29 0701 - 12/30 0700 In: 655 [P.O.:600; IV Piggyback:55] Out: 1050 [Urine:1050] Intake/Output this shift: No intake/output data recorded.  Recent Labs    07/21/17 1305 07/22/17 0529 07/23/17 0558  HGB 10.5* 9.6* 10.2*   Recent Labs    07/22/17 0529 07/23/17 0558  WBC 16.8* 16.0*  RBC 3.06* 3.22*  HCT 29.7* 31.5*  PLT 238 294   Recent Labs    07/22/17 0529 07/23/17 0558  NA 131* 131*  K 4.0 4.3  CL 100* 99*  CO2 23 22  BUN 24* 19  CREATININE 1.26* 1.08  GLUCOSE 172* 133*  CALCIUM 9.0 8.8*   No results for input(s): LABPT, INR in the last 72 hours.  Incision: dressing C/D/I No drainage. No odor.  Assessment/Plan: 3 Days Post-Op Procedure(s) (LRB): LEFT  BELOW KNEE AMPUTATION (Left) Advance diet Up with therapy Plan for discharge tomorrow  Maguire Killmer C 07/24/2017, 8:11 AM

## 2017-07-24 NOTE — Progress Notes (Signed)
[facility]  PROGRESS NOTE  Justin Stone WUJ:[id number] DOB: 02/23/1961 DOA: 07/21/2017 PCP: Pearline Cables, MD   Brief HPI:    56 year old male with a history of diabetes mellitus, hypertension, hyperlipidemia, chronic pain presenting from his primary care provider's office on 07/21/2017 secondary to a necrotic left foot wound.  Unfortunately, the patient is a poor historian and is unable to provide chronicity of events.  According to him, he has had a wound on his left foot since October 2018.  He cannot clarify whether he has been on any antibiotics or how the wound came about, although he states that he has had previously followed up with his primary care provider regarding a foot wound.  Apparently, the patient most recently saw his primary care provider on July 06, 2017 for a fall, but he did not mention anything about his left foot wound.  Nevertheless, the patient had a gangrenous left foot when evaluated in the emergency department.  Orthopedics, Dr. Victorino Dike was consulted who felt that the foot was not salvageable.  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      Subjective   Patient seen and examined, still complains of pain.   Assessment/Plan:     1. Gangrene left foot, status post left BKA. orthopedics consulted. Recommend CIR 2. Acute kidney injury-improved, baseline creatinine 1.0, came with creatinine 1.55. Lisinopril on hold. Started on IV fluids. Today creatinine is 1.08. 3. Hypertension-continue amlodipine 4. Diabetes mellitus- continue sliding scale insulin with NovoLog. 5. Chronic pain syndrome- continue MS Contin, OxyIR, gabapentin   DVT prophylaxis: Lovenox  Code Status: Full code  Family Communication: No family present at bedside  Disposition Plan: Skilled nursing facility   Consultants:  Orthopedics  Procedures:  Left BKA  Continuous infusions . methocarbamol (ROBAXIN)  IV 500 mg (07/21/17 2010)      Antibiotics:    Anti-infectives (From admission, onward)   Start     Dose/Rate Route Frequency Ordered Stop   07/22/17 1300  cefTRIAXone (ROCEPHIN) 2 g in dextrose 5 % 50 mL IVPB     2 g 100 mL/hr over 30 Minutes Intravenous  Once 07/21/17 2257 07/22/17 1301   07/22/17 0200  vancomycin (VANCOCIN) IVPB 750 mg/150 ml premix  Status:  Discontinued     750 mg 150 mL/hr over 60 Minutes Intravenous Every 12 hours 07/21/17 1439 07/21/17 2057   07/22/17 0100  vancomycin (VANCOCIN) IVPB 1000 mg/200 mL premix     1,000 mg 200 mL/hr over 60 Minutes Intravenous Every 12 hours 07/21/17 2057 07/22/17 0215   07/21/17 1741  ceFAZolin (ANCEF) 2-4 GM/100ML-% IVPB    Comments:  Anastasio Champion   : cabinet override      07/21/17 1741 07/22/17 0544   07/21/17 1323  vancomycin (VANCOCIN) 500 MG powder    Comments:  Abran Duke   : cabinet override      07/21/17 1323 07/22/17 0129   07/21/17 1300  vancomycin (VANCOCIN) 2,500 mg in sodium chloride 0.9 % 500 mL IVPB     2,500 mg 250 mL/hr over 120 Minutes Intravenous  Once 07/21/17 1218 07/21/17 1558   07/21/17 1300  cefTRIAXone (ROCEPHIN) 2 g in dextrose 5 % 50 mL IVPB     2 g 100 mL/hr over 30 Minutes Intravenous  Once 07/21/17 1219 07/21/17 1430   07/21/17 1215  cefTRIAXone (ROCEPHIN) 1 g in dextrose 5 % 50 mL IVPB  Status:  Discontinued     1 g 100 mL/hr over 30 Minutes Intravenous  Once 07/21/17 1204 07/21/17 1219       Objective   Vitals:   07/23/17 0522 07/23/17 1431 07/23/17 2043 07/24/17 0636  BP: (!) 161/73 (!) 143/78 (!) 175/91 (!) 162/89  Pulse: (!) 110 (!) 106 (!) 108 (!) 104  Resp: 20 16 16 18   Temp: 98.6 F (37 C) 98.2 F (36.8 C) 98.2 F (36.8 C) 98 F (36.7 C)  TempSrc: Oral Oral Oral Oral  SpO2: 93% 95% 96% 95%  Weight:      Height:        Intake/Output Summary (Last 24 hours) at 07/24/2017 1257 Last data filed at 07/24/2017 0553 Gross per 24 hour  Intake 655 ml  Output 800 ml  Net -145 ml   Filed Weights   07/21/17 1155   Weight: 113.4 kg (250 lb)     Physical Examination:  Physical Exam: Eyes: No icterus, extraocular muscles intact  Mouth: Oral mucosa is moist, no lesions on palate,  Neck: Supple, no deformities, masses, or tenderness Lungs: Normal respiratory effort, bilateral clear to auscultation, no crackles or wheezes.  Heart: Regular rate and rhythm, S1 and S2 normal, no murmurs, rubs auscultated Abdomen: BS normoactive,soft,nondistended,non-tender to palpation,no organomegaly Extremities: s/p left BKA, also right BKA Neuro : Alert and oriented to time, place and person, No focal deficits Skin: No rashes seen on exam      Data Reviewed: I have personally reviewed following labs and imaging studies  CBG: Recent Labs  Lab 07/23/17 1210 07/23/17 1655 07/23/17 2048 07/24/17 0708 07/24/17 1220  GLUCAP 135* 135* 133* 121* 154*    CBC: Recent Labs  Lab 07/21/17 1305 07/22/17 0529 07/23/17 0558  WBC 17.7* 16.8* 16.0*  NEUTROABS 14.4*  --   --   HGB 10.5* 9.6* 10.2*  HCT 32.1* 29.7* 31.5*  MCV 95.5 97.1 97.8  PLT 264 238 294    Basic Metabolic Panel: Recent Labs  Lab 07/21/17 1229 07/22/17 0529 07/23/17 0558  NA 128* 131* 131*  K 3.9 4.0 4.3  CL 96* 100* 99*  CO2 19* 23 22  GLUCOSE 165* 172* 133*  BUN 33* 24* 19  CREATININE 1.55* 1.26* 1.08  CALCIUM 9.4 9.0 8.8*    No results found for this or any previous visit (from the past 240 hour(s)).   Liver Function Tests: No results for input(s): AST, ALT, ALKPHOS, BILITOT, PROT, ALBUMIN in the last 168 hours. No results for input(s): LIPASE, AMYLASE in the last 168 hours. No results for input(s): AMMONIA in the last 168 hours.  Cardiac Enzymes: No results for input(s): CKTOTAL, CKMB, CKMBINDEX, TROPONINI in the last 168 hours. BNP (last 3 results) No results for input(s): BNP in the last 8760 hours.  ProBNP (last 3 results) No results for input(s): PROBNP in the last 8760 hours.    Studies: No results  found.  Scheduled Meds: . amLODipine  10 mg Oral Daily  . aspirin EC  81 mg Oral Daily  . bisacodyl  10 mg Rectal Once  . carisoprodol  350 mg Oral TID  . docusate sodium  100 mg Oral BID  . enoxaparin (LOVENOX) injection  40 mg Subcutaneous Q24H  . ferrous sulfate  325 mg Oral BID WC  . gabapentin  1,200 mg Oral TID  . insulin aspart  0-15 Units Subcutaneous TID WC  . ketotifen  1 drop Both Eyes BID  . morphine  15 mg Oral Q12H  . senna  1 tablet Oral BID  . simvastatin  10 mg Oral  QHS  . venlafaxine  75 mg Oral BID WC      Time spent: 25 min  Meredeth IdeGagan S Iram Lundberg   [facility] Pager [phone]. If 7PM-7AM, please contact night-coverage at [email or web address], Office  [phone]  password [email or web address]  07/24/2017, 12:57 PM  LOS: 3 days

## 2017-07-25 ENCOUNTER — Telehealth: Payer: Self-pay | Admitting: Family Medicine

## 2017-07-25 ENCOUNTER — Encounter (HOSPITAL_COMMUNITY): Payer: Self-pay | Admitting: Nurse Practitioner

## 2017-07-25 ENCOUNTER — Other Ambulatory Visit: Payer: Self-pay

## 2017-07-25 ENCOUNTER — Inpatient Hospital Stay (HOSPITAL_COMMUNITY)
Admission: RE | Admit: 2017-07-25 | Discharge: 2017-08-18 | DRG: 560 | Disposition: A | Discharge status: Skilled Nursing Facility | Payer: Medicare Other | Source: Intra-hospital | Attending: Physical Medicine & Rehabilitation | Admitting: Physical Medicine & Rehabilitation

## 2017-07-25 DIAGNOSIS — D62 Acute posthemorrhagic anemia: Secondary | ICD-10-CM | POA: Diagnosis present

## 2017-07-25 DIAGNOSIS — Z4781 Encounter for orthopedic aftercare following surgical amputation: Secondary | ICD-10-CM | POA: Diagnosis not present

## 2017-07-25 DIAGNOSIS — E1142 Type 2 diabetes mellitus with diabetic polyneuropathy: Secondary | ICD-10-CM | POA: Diagnosis present

## 2017-07-25 DIAGNOSIS — E1140 Type 2 diabetes mellitus with diabetic neuropathy, unspecified: Secondary | ICD-10-CM | POA: Diagnosis not present

## 2017-07-25 DIAGNOSIS — I10 Essential (primary) hypertension: Secondary | ICD-10-CM | POA: Diagnosis present

## 2017-07-25 DIAGNOSIS — B999 Unspecified infectious disease: Secondary | ICD-10-CM

## 2017-07-25 DIAGNOSIS — S88112A Complete traumatic amputation at level between knee and ankle, left lower leg, initial encounter: Secondary | ICD-10-CM

## 2017-07-25 DIAGNOSIS — E785 Hyperlipidemia, unspecified: Secondary | ICD-10-CM | POA: Diagnosis not present

## 2017-07-25 DIAGNOSIS — D500 Iron deficiency anemia secondary to blood loss (chronic): Secondary | ICD-10-CM | POA: Diagnosis not present

## 2017-07-25 DIAGNOSIS — W19XXXA Unspecified fall, initial encounter: Secondary | ICD-10-CM

## 2017-07-25 DIAGNOSIS — R7989 Other specified abnormal findings of blood chemistry: Secondary | ICD-10-CM | POA: Diagnosis not present

## 2017-07-25 DIAGNOSIS — Z7984 Long term (current) use of oral hypoglycemic drugs: Secondary | ICD-10-CM | POA: Diagnosis not present

## 2017-07-25 DIAGNOSIS — Z87891 Personal history of nicotine dependence: Secondary | ICD-10-CM

## 2017-07-25 DIAGNOSIS — Z7982 Long term (current) use of aspirin: Secondary | ICD-10-CM

## 2017-07-25 DIAGNOSIS — E876 Hypokalemia: Secondary | ICD-10-CM | POA: Diagnosis not present

## 2017-07-25 DIAGNOSIS — M6250 Muscle wasting and atrophy, not elsewhere classified, unspecified site: Secondary | ICD-10-CM | POA: Diagnosis not present

## 2017-07-25 DIAGNOSIS — F1096 Alcohol use, unspecified with alcohol-induced persisting amnestic disorder: Secondary | ICD-10-CM | POA: Diagnosis present

## 2017-07-25 DIAGNOSIS — M62838 Other muscle spasm: Secondary | ICD-10-CM | POA: Diagnosis not present

## 2017-07-25 DIAGNOSIS — R140 Abdominal distension (gaseous): Secondary | ICD-10-CM | POA: Diagnosis not present

## 2017-07-25 DIAGNOSIS — E512 Wernicke's encephalopathy: Secondary | ICD-10-CM | POA: Diagnosis not present

## 2017-07-25 DIAGNOSIS — Z89512 Acquired absence of left leg below knee: Secondary | ICD-10-CM

## 2017-07-25 DIAGNOSIS — G4700 Insomnia, unspecified: Secondary | ICD-10-CM

## 2017-07-25 DIAGNOSIS — R195 Other fecal abnormalities: Secondary | ICD-10-CM

## 2017-07-25 DIAGNOSIS — R1311 Dysphagia, oral phase: Secondary | ICD-10-CM | POA: Diagnosis not present

## 2017-07-25 DIAGNOSIS — M869 Osteomyelitis, unspecified: Secondary | ICD-10-CM

## 2017-07-25 DIAGNOSIS — F04 Amnestic disorder due to known physiological condition: Secondary | ICD-10-CM

## 2017-07-25 DIAGNOSIS — Z79899 Other long term (current) drug therapy: Secondary | ICD-10-CM

## 2017-07-25 DIAGNOSIS — K567 Ileus, unspecified: Secondary | ICD-10-CM | POA: Diagnosis not present

## 2017-07-25 DIAGNOSIS — Z89511 Acquired absence of right leg below knee: Secondary | ICD-10-CM

## 2017-07-25 DIAGNOSIS — R413 Other amnesia: Secondary | ICD-10-CM | POA: Diagnosis not present

## 2017-07-25 DIAGNOSIS — R109 Unspecified abdominal pain: Secondary | ICD-10-CM | POA: Diagnosis not present

## 2017-07-25 DIAGNOSIS — E871 Hypo-osmolality and hyponatremia: Secondary | ICD-10-CM

## 2017-07-25 DIAGNOSIS — M6281 Muscle weakness (generalized): Secondary | ICD-10-CM | POA: Diagnosis not present

## 2017-07-25 DIAGNOSIS — G894 Chronic pain syndrome: Secondary | ICD-10-CM | POA: Diagnosis not present

## 2017-07-25 DIAGNOSIS — S88112S Complete traumatic amputation at level between knee and ankle, left lower leg, sequela: Secondary | ICD-10-CM

## 2017-07-25 DIAGNOSIS — Z833 Family history of diabetes mellitus: Secondary | ICD-10-CM | POA: Diagnosis not present

## 2017-07-25 DIAGNOSIS — M7989 Other specified soft tissue disorders: Secondary | ICD-10-CM | POA: Diagnosis not present

## 2017-07-25 DIAGNOSIS — G9340 Encephalopathy, unspecified: Secondary | ICD-10-CM | POA: Diagnosis not present

## 2017-07-25 DIAGNOSIS — N179 Acute kidney failure, unspecified: Secondary | ICD-10-CM

## 2017-07-25 DIAGNOSIS — G546 Phantom limb syndrome with pain: Secondary | ICD-10-CM | POA: Diagnosis present

## 2017-07-25 DIAGNOSIS — G8918 Other acute postprocedural pain: Secondary | ICD-10-CM

## 2017-07-25 DIAGNOSIS — I517 Cardiomegaly: Secondary | ICD-10-CM | POA: Diagnosis not present

## 2017-07-25 DIAGNOSIS — I8290 Acute embolism and thrombosis of unspecified vein: Secondary | ICD-10-CM | POA: Diagnosis not present

## 2017-07-25 DIAGNOSIS — E119 Type 2 diabetes mellitus without complications: Secondary | ICD-10-CM

## 2017-07-25 DIAGNOSIS — K9189 Other postprocedural complications and disorders of digestive system: Secondary | ICD-10-CM

## 2017-07-25 DIAGNOSIS — D72829 Elevated white blood cell count, unspecified: Secondary | ICD-10-CM | POA: Diagnosis not present

## 2017-07-25 DIAGNOSIS — M1990 Unspecified osteoarthritis, unspecified site: Secondary | ICD-10-CM | POA: Diagnosis not present

## 2017-07-25 DIAGNOSIS — K5900 Constipation, unspecified: Secondary | ICD-10-CM | POA: Diagnosis present

## 2017-07-25 DIAGNOSIS — M8580 Other specified disorders of bone density and structure, unspecified site: Secondary | ICD-10-CM | POA: Diagnosis not present

## 2017-07-25 DIAGNOSIS — R278 Other lack of coordination: Secondary | ICD-10-CM | POA: Diagnosis not present

## 2017-07-25 DIAGNOSIS — R410 Disorientation, unspecified: Secondary | ICD-10-CM | POA: Diagnosis not present

## 2017-07-25 DIAGNOSIS — R000 Tachycardia, unspecified: Secondary | ICD-10-CM

## 2017-07-25 DIAGNOSIS — G8911 Acute pain due to trauma: Secondary | ICD-10-CM | POA: Diagnosis not present

## 2017-07-25 DIAGNOSIS — K219 Gastro-esophageal reflux disease without esophagitis: Secondary | ICD-10-CM | POA: Diagnosis not present

## 2017-07-25 LAB — CBC
HEMATOCRIT: 35.2 % — AB (ref 39.0–52.0)
HEMOGLOBIN: 11.7 g/dL — AB (ref 13.0–17.0)
MCH: 31.7 pg (ref 26.0–34.0)
MCHC: 33.2 g/dL (ref 30.0–36.0)
MCV: 95.4 fL (ref 78.0–100.0)
Platelets: 341 10*3/uL (ref 150–400)
RBC: 3.69 MIL/uL — AB (ref 4.22–5.81)
RDW: 14.7 % (ref 11.5–15.5)
WBC: 13.3 10*3/uL — AB (ref 4.0–10.5)

## 2017-07-25 LAB — CREATININE, SERUM: Creatinine, Ser: 1.14 mg/dL (ref 0.61–1.24)

## 2017-07-25 LAB — GLUCOSE, CAPILLARY
GLUCOSE-CAPILLARY: 168 mg/dL — AB (ref 65–99)
Glucose-Capillary: 152 mg/dL — ABNORMAL HIGH (ref 65–99)
Glucose-Capillary: 134 mg/dL — ABNORMAL HIGH (ref 65–99)
Glucose-Capillary: 135 mg/dL — ABNORMAL HIGH (ref 65–99)

## 2017-07-25 MED ORDER — METHOCARBAMOL 1000 MG/10ML IJ SOLN
500.0000 mg | Freq: Four times a day (QID) | INTRAVENOUS | Status: DC | PRN
  Filled 2017-07-25: qty 5

## 2017-07-25 MED ORDER — DOCUSATE SODIUM 100 MG PO CAPS
100.0000 mg | ORAL_CAPSULE | Freq: Two times a day (BID) | ORAL | Status: DC
  Administered 2017-07-25 – 2017-08-18 (×39): 100 mg via ORAL
  Filled 2017-07-25 (×42): qty 1

## 2017-07-25 MED ORDER — SIMVASTATIN 5 MG PO TABS
10.0000 mg | ORAL_TABLET | Freq: Every day | ORAL | Status: DC
  Administered 2017-07-26 – 2017-08-17 (×22): 10 mg via ORAL
  Filled 2017-07-25 (×24): qty 2

## 2017-07-25 MED ORDER — INSULIN ASPART 100 UNIT/ML ~~LOC~~ SOLN
0.0000 [IU] | Freq: Three times a day (TID) | SUBCUTANEOUS | Status: DC
  Administered 2017-07-25 – 2017-07-26 (×2): 2 [IU] via SUBCUTANEOUS
  Administered 2017-07-26 – 2017-07-27 (×3): 3 [IU] via SUBCUTANEOUS
  Administered 2017-07-27 – 2017-07-30 (×8): 2 [IU] via SUBCUTANEOUS
  Administered 2017-07-31: 5 [IU] via SUBCUTANEOUS
  Administered 2017-08-01 – 2017-08-04 (×6): 2 [IU] via SUBCUTANEOUS
  Administered 2017-08-04: 3 [IU] via SUBCUTANEOUS
  Administered 2017-08-05 – 2017-08-11 (×4): 2 [IU] via SUBCUTANEOUS
  Administered 2017-08-12: 3 [IU] via SUBCUTANEOUS
  Administered 2017-08-12 – 2017-08-15 (×3): 2 [IU] via SUBCUTANEOUS
  Administered 2017-08-17: 5 [IU] via SUBCUTANEOUS
  Administered 2017-08-17 – 2017-08-18 (×2): 2 [IU] via SUBCUTANEOUS

## 2017-07-25 MED ORDER — METHOCARBAMOL 500 MG PO TABS
500.0000 mg | ORAL_TABLET | Freq: Four times a day (QID) | ORAL | Status: DC | PRN
  Administered 2017-07-26 – 2017-08-13 (×11): 500 mg via ORAL
  Filled 2017-07-25 (×11): qty 1

## 2017-07-25 MED ORDER — OXYCODONE HCL 5 MG PO TABS
5.0000 mg | ORAL_TABLET | ORAL | Status: DC | PRN
  Administered 2017-07-26 – 2017-07-29 (×5): 5 mg via ORAL
  Filled 2017-07-25 (×5): qty 1

## 2017-07-25 MED ORDER — TRAZODONE HCL 50 MG PO TABS
25.0000 mg | ORAL_TABLET | Freq: Every evening | ORAL | Status: DC | PRN
  Administered 2017-07-28 – 2017-08-17 (×17): 25 mg via ORAL
  Filled 2017-07-25 (×17): qty 1

## 2017-07-25 MED ORDER — ONDANSETRON HCL 4 MG PO TABS: 4.0000 mg | ORAL_TABLET | Freq: Four times a day (QID) | ORAL | Status: DC | PRN

## 2017-07-25 MED ORDER — SENNA 8.6 MG PO TABS
1.0000 | ORAL_TABLET | Freq: Two times a day (BID) | ORAL | Status: DC
  Administered 2017-07-25 – 2017-07-30 (×11): 8.6 mg via ORAL
  Filled 2017-07-25 (×12): qty 1

## 2017-07-25 MED ORDER — ACETAMINOPHEN 650 MG RE SUPP: 650.0000 mg | RECTAL | Status: DC | PRN

## 2017-07-25 MED ORDER — SIMVASTATIN 10 MG PO TABS: 10.0000 mg | ORAL_TABLET | Freq: Every day | ORAL | Status: DC

## 2017-07-25 MED ORDER — ENOXAPARIN SODIUM 40 MG/0.4ML ~~LOC~~ SOLN
40.0000 mg | SUBCUTANEOUS | Status: DC
  Administered 2017-07-26 – 2017-08-18 (×23): 40 mg via SUBCUTANEOUS
  Filled 2017-07-25 (×24): qty 0.4

## 2017-07-25 MED ORDER — SORBITOL 70 % SOLN
30.0000 mL | Freq: Every day | Status: DC | PRN
  Filled 2017-07-25: qty 30

## 2017-07-25 MED ORDER — BISACODYL 10 MG RE SUPP
10.0000 mg | Freq: Every day | RECTAL | Status: DC | PRN
  Administered 2017-08-01: 10 mg via RECTAL
  Filled 2017-07-25: qty 1

## 2017-07-25 MED ORDER — ACETAMINOPHEN 325 MG PO TABS
650.0000 mg | ORAL_TABLET | ORAL | Status: DC | PRN
  Administered 2017-08-06 – 2017-08-15 (×4): 650 mg via ORAL
  Filled 2017-07-25 (×3): qty 2

## 2017-07-25 MED ORDER — POLYETHYLENE GLYCOL 3350 17 G PO PACK: 17.0000 g | PACK | Freq: Every day | ORAL | 0 refills | Status: DC | PRN

## 2017-07-25 MED ORDER — POLYETHYLENE GLYCOL 3350 17 G PO PACK
17.0000 g | PACK | Freq: Every day | ORAL | Status: DC | PRN
  Administered 2017-07-27: 17 g via ORAL
  Filled 2017-07-25: qty 1

## 2017-07-25 MED ORDER — GABAPENTIN 400 MG PO CAPS
1200.0000 mg | ORAL_CAPSULE | Freq: Three times a day (TID) | ORAL | Status: DC
  Administered 2017-07-25 – 2017-07-30 (×17): 1200 mg via ORAL
  Filled 2017-07-25 (×17): qty 3

## 2017-07-25 MED ORDER — FERROUS SULFATE 325 (65 FE) MG PO TABS
325.0000 mg | ORAL_TABLET | Freq: Two times a day (BID) | ORAL | Status: DC
  Administered 2017-07-25 – 2017-08-18 (×48): 325 mg via ORAL
  Filled 2017-07-25 (×48): qty 1

## 2017-07-25 MED ORDER — CARISOPRODOL 350 MG PO TABS
350.0000 mg | ORAL_TABLET | Freq: Three times a day (TID) | ORAL | Status: DC
  Administered 2017-07-25 – 2017-07-29 (×13): 350 mg via ORAL
  Filled 2017-07-25 (×13): qty 1

## 2017-07-25 MED ORDER — KETOTIFEN FUMARATE 0.025 % OP SOLN
1.0000 [drp] | Freq: Two times a day (BID) | OPHTHALMIC | Status: DC
  Administered 2017-07-26 – 2017-08-18 (×41): 1 [drp] via OPHTHALMIC
  Filled 2017-07-25 (×2): qty 5

## 2017-07-25 MED ORDER — METHOCARBAMOL 500 MG PO TABS: 500.0000 mg | ORAL_TABLET | Freq: Four times a day (QID) | ORAL | Status: DC | PRN

## 2017-07-25 MED ORDER — MORPHINE SULFATE ER 15 MG PO TBCR
15.0000 mg | EXTENDED_RELEASE_TABLET | Freq: Two times a day (BID) | ORAL | Status: DC
  Administered 2017-07-25 – 2017-08-01 (×13): 15 mg via ORAL
  Filled 2017-07-25 (×13): qty 1

## 2017-07-25 MED ORDER — VENLAFAXINE HCL 75 MG PO TABS: 75.0000 mg | ORAL_TABLET | Freq: Two times a day (BID) | ORAL | Status: DC

## 2017-07-25 MED ORDER — AMLODIPINE BESYLATE 10 MG PO TABS
10.0000 mg | ORAL_TABLET | Freq: Every day | ORAL | Status: DC
  Administered 2017-07-26 – 2017-08-18 (×24): 10 mg via ORAL
  Filled 2017-07-25 (×24): qty 1

## 2017-07-25 MED ORDER — ONDANSETRON HCL 4 MG/2ML IJ SOLN: 4.0000 mg | Freq: Four times a day (QID) | INTRAMUSCULAR | Status: DC | PRN

## 2017-07-25 MED ORDER — VENLAFAXINE HCL 75 MG PO TABS
75.0000 mg | ORAL_TABLET | Freq: Two times a day (BID) | ORAL | Status: DC
  Administered 2017-07-25 – 2017-08-18 (×48): 75 mg via ORAL
  Filled 2017-07-25 (×48): qty 1

## 2017-07-25 MED ORDER — ENOXAPARIN SODIUM 40 MG/0.4ML ~~LOC~~ SOLN: 40.0000 mg | SUBCUTANEOUS | Status: DC

## 2017-07-25 MED ORDER — ASPIRIN EC 81 MG PO TBEC
81.0000 mg | DELAYED_RELEASE_TABLET | Freq: Every day | ORAL | Status: DC
  Administered 2017-07-26 – 2017-08-18 (×24): 81 mg via ORAL
  Filled 2017-07-25 (×24): qty 1

## 2017-07-25 MED ORDER — SENNA 8.6 MG PO TABS: 1.0000 | ORAL_TABLET | Freq: Two times a day (BID) | ORAL | 0 refills | Status: DC

## 2017-07-25 NOTE — Progress Notes (Signed)
Rehab admissions - I met with patient this am.  I explained CIR versus SNF.  Patient was initially worried about copays for CIR with his [facility], but patient has medicaid secondary and this will take care of his copays.  I have approval for acute inpatient rehab admission from his [facility].  I have faxed over a letter and a rehab booklet for the patient to review.  I do have beds available on inpatient rehab at [facility] if patient decides to come to [facility] inpatient rehab.  Call me for questions.  #[phone]

## 2017-07-25 NOTE — IPOC Note (Signed)
Overall Plan of Care [facility](IPOC) Patient Details Name: Justin Stone MRN: [id number] DOB: 07/21/1961  Admitting Diagnosis: Bilateral BKA  Hospital Problems: Active Problems:   Amputation of left lower extremity below knee (HCC)   Post-operative pain   Type 2 diabetes mellitus with peripheral neuropathy (HCC)     Functional Problem List: Nursing Bladder, Bowel, Medication Management, Nutrition, Pain, Sensory, Skin Integrity, Endurance  PT Balance, Endurance, Motor, Pain, Safety, Skin Integrity  OT Balance, Pain, Safety, Cognition, Sensory, Endurance, Motor  SLP    TR         Basic ADL's: OT Eating, Grooming, Bathing, Dressing, Toileting     Advanced  ADL's: OT Simple Meal Preparation     Transfers: PT Bed Mobility, Bed to Chair, Car, Furniture, Floor  OT Toilet, BaristaTub/Shower, Other (comment)     Locomotion: PT Ambulation, Psychologist, prison and probation servicesWheelchair Mobility, Stairs     Additional Impairments: OT    SLP        TR      Anticipated Outcomes Item Anticipated Outcome  Self Feeding mod I  Swallowing      Basic self-care  Mod I  Toileting  mod I    Bathroom Transfers mod I   Bowel/Bladder  Mod I  Transfers  Mod I   Locomotion  Mod I   Communication     Cognition     Pain  Controlled with pain medication regimen (chronic pain)  Safety/Judgment  Mod I   Therapy Plan: PT Intensity: Minimum of 1-2 x/day ,45 to 90 minutes PT Frequency: 5 out of 7 days PT Duration Estimated Length of Stay: 10-14 days  OT Intensity: Minimum of 1-2 x/day, 45 to 90 minutes OT Frequency: 5 out of 7 days OT Duration/Estimated Length of Stay: 10-14      Team Interventions: Nursing Interventions Patient/Family Education, Pain Management, Bladder Management, Medication Management, Discharge Planning, Bowel Management, Skin Care/Wound Management, Psychosocial Support, Disease Management/Prevention  PT interventions Ambulation/gait training, Disease management/prevention, Pain management, Stair  training, Visual/perceptual remediation/compensation, Wheelchair propulsion/positioning, Therapeutic Activities, Patient/family education, DME/adaptive equipment instruction, Warden/rangerBalance/vestibular training, Cognitive remediation/compensation, Psychosocial support, Therapeutic Exercise, UE/LE Strength taining/ROM, Skin care/wound management, Functional mobility training, Community reintegration, Discharge planning, Neuromuscular re-education, Splinting/orthotics, UE/LE Coordination activities  OT Interventions Warden/rangerBalance/vestibular training, Discharge planning, Self Care/advanced ADL retraining, Therapeutic Activities, UE/LE Coordination activities, DME/adaptive equipment instruction, Community reintegration, UE/LE Strength taining/ROM, Research scientist (life sciences)Wheelchair propulsion/positioning, Therapeutic Exercise, Patient/family education  SLP Interventions    TR Interventions    SW/CM Interventions Discharge Planning, Psychosocial Support, Patient/Family Education   Barriers to Discharge MD  Medical stability, Wound care and Weight bearing restrictions  Nursing Wound Care, Lack of/limited family support, Weight bearing restrictions, Medication compliance lives alone  PT Decreased caregiver support, Lack of/limited family support, Home environment access/layout 1 STE to enter home; per chart review and conversation w/ pt, a ramp is being built   OT Inaccessible home environment, Decreased caregiver support home envionrment and functional IND with ADLs/safety awareness   SLP      SW       Team Discharge Planning: Destination: PT-Home ,OT- Home , SLP-  Projected Follow-up: PT-Home health PT, OT-  Home health OT, SLP-  Projected Equipment Needs: PT-To be determined, OT- 3 in 1 bedside comode, SLP-  Equipment Details: PT- , OT-w/c , shower bench, BSC (verify with pt)  Patient/family involved in discharge planning: PT- Patient,  OT-Patient, SLP-   MD ELOS: 7-11 days. Medical Rehab Prognosis:  Good Assessment: 56 year old  right-handed male with history of diabetes mellitus,  chronic pain syndrome, hypertension, right BKA several years ago. Presented 07/21/2017 with ulceration to left foot and no change with conservative care. X-rays of left foot consistent with cellulitis and osteomyelitis. Underwent left BKA 07/21/2017 per Dr. Victorino DikeHewitt. Hospital course pain management. Intermittent bouts of confusion with narcotics adjusted. Acute blood loss anemia monitored. Leukocytosis. Patient with resulting functional deficits with mobility, transfers, safety.  Will set goals for Mod I at wheelchair level with PT/OT.   See Team Conference Notes for weekly updates to the plan of care

## 2017-07-25 NOTE — H&P (Signed)
Physical Medicine and Rehabilitation Admission H&P    Chief Complaint  Patient presents with  . Skin Ulcer  . Foot Pain  : HPI: 56 year old right-handed male with history of diabetes mellitus, chronic pain syndrome, hypertension, right BKA several years ago. History taken from chart review and patient. Patient lives alone. Independent with a cane prior to admission as well as right prosthesis. Presented 07/21/2017 with ulceration to left foot and no change with conservative care. X-rays of left foot consistent with cellulitis and osteomyelitis. Underwent left BKA 07/21/2017 per Dr. Doran Durand. Hospital course pain management. Intermittent bouts of confusion with narcotics adjusted. Acute blood loss anemia 10.2 and monitored. Leukocytosis 16,000. Subcutaneous Lovenox for DVT prophylaxis. Physical and occupational therapy evaluations completed with recommendations of physical medicine rehabilitation consult. Patient was admitted for comprehensive rehabilitation program. (Please also see preadmission note for today).   Review of Systems  Constitutional: Negative for chills and fever.  HENT: Negative for hearing loss.   Eyes: Negative for blurred vision and double vision.  Respiratory: Negative for cough and shortness of breath.   Cardiovascular: Positive for leg swelling. Negative for chest pain and palpitations.  Gastrointestinal: Positive for constipation. Negative for nausea and vomiting.       GERD  Genitourinary: Positive for urgency. Negative for dysuria, flank pain and hematuria.  Musculoskeletal: Positive for myalgias.  Skin: Negative for rash.  Neurological: Negative for seizures.  Psychiatric/Behavioral: Positive for depression.       Anxiety  All other systems reviewed and are negative.  Past Medical History:  Diagnosis Date  . Allergy    eye allergies  . Anxiety   . Arthritis   . Diabetes mellitus without complication ([place])    takes oral meds now only  . GERD  (gastroesophageal reflux disease)    past hx > 2 yrs ago  . Hepatitis    "Hepatitis C"- tx. with Harvoni- now testing negative.  . Hyperlipidemia   . Hypertension   . Neuromuscular disorder (HCC)    neuropathy hands/ feet.  . Osteopenia   . Prosthesis adjustment    right leg- below knee(weighs 10 lbs.)   Past Surgical History:  Procedure Laterality Date  . AMPUTATION Left 07/21/2017   Procedure: LEFT  BELOW KNEE AMPUTATION;  Surgeon: Wylene Simmer, MD;  Location: [facility];  Service: Orthopedics;  Laterality: Left;  . HERNIA REPAIR Left    LIH  . INSERTION OF MESH N/A 06/15/2016   Procedure: INSERTION OF MESH;  Surgeon: Greer Pickerel, MD;  Location: [facility];  Service: General;  Laterality: N/A;  . right leg removal     for diabetes, wears prosthesis -Right Below knee since '15  . UMBILICAL HERNIA REPAIR N/A 06/15/2016   Procedure: LAPAROSCOPIC ASSISTED REPAIR OF  UMBILICAL HERNIA;  Surgeon: Greer Pickerel, MD;  Location: [facility];  Service: General;  Laterality: N/A;   Family History  Problem Relation Age of Onset  . Diabetes Maternal Uncle   . Diabetes Maternal Grandmother   . Colon cancer Neg Hx   . Colon polyps Neg Hx   . Rectal cancer Neg Hx   . Stomach cancer Neg Hx   . Esophageal cancer Neg Hx   . Sickle cell anemia Neg Hx    Social History:  reports that he quit smoking about 31 years ago. His smoking use included cigarettes. He has a 20.00 pack-year smoking history. he has never used smokeless tobacco. He reports that he drinks alcohol. He reports that he does not use  drugs. Allergies: No Known Allergies Medications Prior to Admission  Medication Sig Dispense Refill  . amLODipine (NORVASC) 10 MG tablet TAKE 1 TABLET(10 MG) BY MOUTH DAILY 90 tablet 3  . aspirin EC 81 MG tablet Take 81 mg by mouth daily.    Marland Kitchen azelastine (OPTIVAR) 0.05 % ophthalmic solution Place 1 drop into both eyes 2 (two) times daily. 6 mL 12  . carisoprodol (SOMA) 350 MG tablet Take 1 tablet (350 mg total) by  mouth 3 (three) times daily. 90 tablet 2  . ferrous sulfate 325 (65 FE) MG tablet Take 1 tablet (325 mg total) by mouth 2 (two) times daily with a meal. If constipation take just one a day 60 tablet 3  . furosemide (LASIX) 40 MG tablet TAKE 1 TABLET(40 MG) BY MOUTH DAILY 90 tablet 3  . gabapentin (NEURONTIN) 300 MG capsule Take 4 capsules (1,200 mg total) by mouth 3 (three) times daily. 360 capsule 5  . lisinopril (PRINIVIL,ZESTRIL) 10 MG tablet TAKE 1 TABLET BY MOUTH DAILY 90 tablet 0  . metFORMIN (GLUCOPHAGE) 500 MG tablet TAKE 2 TABLETS BY MOUTH IN THE MORNING AND 1 TABLET IN THE EVENING 270 tablet 2  . morphine (MS CONTIN) 15 MG 12 hr tablet Take 1 tablet (15 mg total) by mouth every 12 (twelve) hours. 60 tablet 0  . NARCAN 4 MG/0.1ML LIQD nasal spray kit CALL 911 AND U 1 SPR IN 1 NOS . REPEAT AFTRER 3 MIN IF NO OR MINIMAL RESPONSE  0  . Oxycodone HCl 10 MG TABS Take 0.5-1 tablets (5-10 mg total) by mouth every 4 (four) hours as needed. This is a 30 day supply.  To fill 60 days after rx 70 tablet 0  . Polyethyl Glycol-Propyl Glycol (SYSTANE OP) Place 1 drop into both eyes daily as needed (dry eyes).     . traZODone (DESYREL) 50 MG tablet Take 0.5 tablets (25 mg total) by mouth at bedtime as needed for sleep. 45 tablet 3  . blood glucose meter kit and supplies Dispense based on patient and insurance preference. Use up to four times daily as directed. (FOR ICD-9 250.00, 250.01). 1 each 0  . glucose blood (ONETOUCH VERIO) test strip Check blood sugar daily as directed. 100 each 3  . Lancet Device MISC Use as directed twice a day.  Patient has one touch delica lancets, please give appropriate device. 1 each 0  . ONETOUCH DELICA LANCETS 94F MISC USE TO TEST BLOOD SUGAR TWICE DAILY 200 each 5  . sildenafil (VIAGRA) 100 MG tablet Take 0.5-1 tablets (50-100 mg total) by mouth daily as needed for erectile dysfunction. 5 tablet 11    Drug Regimen Review Drug regimen was reviewed and remains appropriate  with no significant issues identified  Home: Home Living Family/patient expects to be discharged to:: Private residence Living Arrangements: Alone Home Access: Level entry Home Layout: One level Home Equipment: [place] - single point   Functional History: Prior Function Level of Independence: Independent with assistive device(s)  Functional Status:  Mobility: Bed Mobility Overal bed mobility: Needs Assistance Bed Mobility: Supine to Sit, Rolling Rolling: Mod assist Supine to sit: Min guard, HOB elevated General bed mobility comments: increased time and effort, pt on bed pan on arrival, mod assist for rolling at hips to complete turn, cues for selfing assisting to EOB, provided a hand for pt to pull trunk upright Transfers Overall transfer level: Needs assistance Transfers: Lateral/Scoot Transfers  Lateral/Scoot Transfers: Min assist, From elevated surface General transfer comment:  increased time and effort, max verbal cues for technique and weight shifting, blocked knees to prevent forward sliding, utilized bed pad to assist with scoot at times      ADL: ADL Overall ADL's : Needs assistance/impaired Eating/Feeding: Set up, Sitting Grooming: Set up, Sitting Grooming Details (indicate cue type and reason): supported sitting  Upper Body Bathing: Sitting, Minimal assistance Lower Body Bathing: Maximal assistance, Sitting/lateral leans, +2 for safety/equipment Upper Body Dressing : Set up, Sitting Lower Body Dressing: Maximal assistance, +2 for safety/equipment, Sitting/lateral leans Toilet Transfer: Moderate assistance, Requires drop arm, Requires wide/bariatric, Transfer board Toilet Transfer Details (indicate cue type and reason): simulated in bed to chair transfer lateral scoot  Toileting- Clothing Manipulation and Hygiene: Maximal assistance, Sitting/lateral lean Functional mobility during ADLs: Minimal assistance, +2 for safety/equipment(lateral scoot transfers  )  Cognition: Cognition Overall Cognitive Status: Within Functional Limits for tasks assessed Orientation Level: Oriented X4 Cognition Arousal/Alertness: Awake/alert Behavior During Therapy: WFL for tasks assessed/performed Overall Cognitive Status: Within Functional Limits for tasks assessed General Comments: follows commands however poor self awareness  Physical Exam: Blood pressure (!) 153/80, pulse 100, temperature 98.9 F (37.2 C), temperature source Oral, resp. rate 18, height 5' 8"  (1.727 m), weight 113.4 kg (250 lb), SpO2 98 %. General: NAD. Vitals stable. HEENT. EOMs intact. Pupils round reactive to light Neck. Supple. ROM WNL Cardiac. +Tachycardia. Regular rhythm.  No murmur. No JVD. Respiratory. Clear to auscultation without wheeze Abdomen soft nontender good bowel sounds. Neurological. Alert and oriented. He had a mild delay in discussing his full hospital course. Followed full commands. Motor: B/l UE 5/5 proximal to distal LLE: HF 4/5, knee braced RLE: HF, KE 4+/5 Skin. Left BKA site with limb guard in place. Right BKA with small friction area at base of stump  Results for orders placed or performed during the hospital encounter of 07/21/17 (from the past 48 hour(s))  Glucose, capillary     Status: Abnormal   Collection Time: 07/23/17 12:10 PM  Result Value Ref Range   Glucose-Capillary 135 (H) 65 - 99 mg/dL  Glucose, capillary     Status: Abnormal   Collection Time: 07/23/17  4:55 PM  Result Value Ref Range   Glucose-Capillary 135 (H) 65 - 99 mg/dL  Glucose, capillary     Status: Abnormal   Collection Time: 07/23/17  8:48 PM  Result Value Ref Range   Glucose-Capillary 133 (H) 65 - 99 mg/dL  Glucose, capillary     Status: Abnormal   Collection Time: 07/24/17  7:08 AM  Result Value Ref Range   Glucose-Capillary 121 (H) 65 - 99 mg/dL  Glucose, capillary     Status: Abnormal   Collection Time: 07/24/17 12:20 PM  Result Value Ref Range   Glucose-Capillary 154  (H) 65 - 99 mg/dL  Glucose, capillary     Status: Abnormal   Collection Time: 07/24/17  5:06 PM  Result Value Ref Range   Glucose-Capillary 123 (H) 65 - 99 mg/dL  Glucose, capillary     Status: Abnormal   Collection Time: 07/24/17 10:11 PM  Result Value Ref Range   Glucose-Capillary 117 (H) 65 - 99 mg/dL  Glucose, capillary     Status: Abnormal   Collection Time: 07/25/17  7:26 AM  Result Value Ref Range   Glucose-Capillary 168 (H) 65 - 99 mg/dL   No results found.  Medical Problem List and Plan: 1.  Decreased functional mobility secondary to left BKA 07/21/2017 as well as history of right BKA. Patient does  use a right prosthesis 2.  DVT Prophylaxis/Anticoagulation: Subcutaneous Lovenox. Monitor for any bleeding episodes 3. Pain Management/chronic pain: Soma 350 mg 3 times a day, Neurontin 1200 mg 3 times a day, MS Contin 15 mg every 12 hours, Robaxin and oxycodone as needed 4. Mood: Effexor 75 mg twice a day Trazodone 25 mg daily at bedtime as needed 5. Neuropsych: This patient is capable of making decisions on his own behalf. 6. Skin/Wound Care: Routine skin checks 7. Fluids/Electrolytes/Nutrition: Routine I&O's with follow-up chemistries 8. Acute blood loss anemia. Continue iron supplement. Follow-up CBC 9. Diabetes mellitus and peripheral neuropathy. Hemoglobin A1c 6.5. Currently with SSI. Check blood sugars before meals and at bedtime. Diabetic teaching. Patient on Glucophage 500 mg twice a day prior to admission. Resume as needed 10. Hypertension. Norvasc 10 mg daily. 11. Hyperlipidemia. Zocor 12. Constipation. Laxative assistance  Post Admission Physician Evaluation: 1. Preadmission assessment reviewed and changes made below. 2. Functional deficits secondary  to bilateral BKA. 3. Patient is admitted to receive collaborative, interdisciplinary care between the physiatrist, rehab nursing staff, and therapy team. 4. Patient's level of medical complexity and substantial therapy  needs in context of that medical necessity cannot be provided at a lesser intensity of care such as a SNF. 5. Patient has experienced substantial functional loss from his/her baseline which was documented above under the "Functional History" and "Functional Status" headings.  Judging by the patient's diagnosis, physical exam, and functional history, the patient has potential for functional progress which will result in measurable gains while on inpatient rehab.  These gains will be of substantial and practical use upon discharge  in facilitating mobility and self-care at the household level. 40. Physiatrist will provide 24 hour management of medical needs as well as oversight of the therapy plan/treatment and provide guidance as appropriate regarding the interaction of the two. 7. 24 hour rehab nursing will assist with bladder management, safety, skin/wound care, disease management, pain management and patient education  and help integrate therapy concepts, techniques,education, etc. 8. PT will assess and treat for/with: Lower extremity strength, range of motion, stamina, balance, functional mobility, safety, adaptive techniques and equipment, woundcare, coping skills, pain control, pre-prosthetic education.   Goals are: Mod I at wheelchair level. 9. OT will assess and treat for/with: ADL's, functional mobility, safety, upper extremity strength, adaptive techniques and equipment, wound mgt, ego support, and community reintegration.   Goals are: Mod I at wheelchair level. Therapy may not proceed with showering this patient. 10. Case Management and Social Worker will assess and treat for psychological issues and discharge planning. 11. Team conference will be held weekly to assess progress toward goals and to determine barriers to discharge. 12. Patient will receive at least 3 hours of therapy per day at least 5 days per week. 13. ELOS: 6-9 days.       14. Prognosis:  good  Delice Lesch, MD, ABPMR Lavon Paganini  Angiulli, PA-C 07/25/2017

## 2017-07-25 NOTE — NC FL2 (Signed)
[place] MEDICAID FL2 LEVEL OF CARE SCREENING TOOL     IDENTIFICATION  Patient Name: Justin Stone Birthdate: Jul 26, 1961 Sex: male Admission Date (Current Location): 07/21/2017  [facility] [place]   Facility and Address[facility]  [facility],  [address], [place] [id number]      Provider Number: [id number]  Attending Physician Name and Address:  Meredeth IdeLama, Gagan S, MD  Relative Name and Phone Number:       Current Level of Care: Hospital Recommended Level of Care: Skilled Nursing Facility Prior Approval Number:    Date Approved/Denied:   PASRR Number:    Discharge Plan: SNF    Current Diagnoses: Patient Active Problem List   Diagnosis Date Noted  . AKI (acute kidney injury) (HCC) 07/22/2017  . Osteomyelitis of left foot (HCC)   . Diabetic wet gangrene of the foot (HCC) 07/21/2017  . Gangrene of left foot (HCC) 07/21/2017  . Unilateral complete BKA, left, initial encounter (HCC) 07/21/2017  . Insomnia 05/13/2016  . Umbilical hernia without obstruction and without gangrene 05/13/2016  . Spondylosis of lumbar region without myelopathy or radiculopathy 07/22/2015  . Obesity 06/29/2015  . 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 Type II diabetes mellitus, well controlled (HCC) 03/26/2015  . S/P BKA (below knee amputation), right (HCC) 03/26/2015  . Alcoholic cirrhosis of liver without ascites (HCC) 02/22/2015  . Chronic hepatitis C without hepatic coma (HCC) 02/22/2015  . Chronic low back pain 12/29/2014  . Essential hypertension, benign 12/29/2014  . Hyperlipidemia LDL goal <100 12/29/2014    Orientation RESPIRATION BLADDER Height & Weight     Self, Time, Situation, Place  Normal Continent Weight: 250 lb (113.4 kg) Height:  5\' 8"  (172.7 cm)  BEHAVIORAL SYMPTOMS/MOOD NEUROLOGICAL BOWEL NUTRITION STATUS      Continent Diet(Carb Modified. )  AMBULATORY STATUS COMMUNICATION OF NEEDS Skin   Extensive  Assist(Transfers into Wheels/Prostetic ) Verbally Normal                       Personal Care Assistance Level of Assistance  Bathing, Dressing, Feeding Bathing Assistance: Limited assistance Feeding assistance: Independent Dressing Assistance: Maximum assistance     Functional Limitations Info  Sight, Hearing, Speech Sight Info: Adequate Hearing Info: Adequate Speech Info: Adequate    SPECIAL CARE FACTORS FREQUENCY  OT (By licensed OT)     PT Frequency: 5x/week OT Frequency: 5x/week            Contractures Contractures Info: Not present    Additional Factors Info  Code Status, Allergies, Psychotropic, Insulin Sliding Scale Code Status Info: Fullcode Allergies Info: Allergies: No Known Allergies   Insulin Sliding Scale Info: 0-15 units 3x's daily with meals       Current Medications (07/25/2017):  This is the current hospital active medication list Current Facility-Administered Medications  Medication Dose Route Frequency Provider Last Rate Last Dose  . acetaminophen (TYLENOL) tablet 650 mg  650 mg Oral Q4H PRN Toni ArthursHewitt, John, MD       Or  . acetaminophen (TYLENOL) suppository 650 mg  650 mg Rectal Q4H PRN Toni ArthursHewitt, John, MD      . amLODipine (NORVASC) tablet 10 mg  10 mg Oral Daily Toni ArthursHewitt, John, MD   10 mg at 07/25/17 0953  . aspirin EC tablet 81 mg  81 mg Oral Daily Toni ArthursHewitt, John, MD   81 mg at 07/25/17 [id number]  . bisacodyl (DULCOLAX) suppository 10 mg  10 mg  Rectal Daily PRN Toni ArthursHewitt, John, MD      . bisacodyl (DULCOLAX) suppository 10 mg  10 mg Rectal Once Meredeth IdeLama, Gagan S, MD      . carisoprodol (SOMA) tablet 350 mg  350 mg Oral TID Toni ArthursHewitt, John, MD   350 mg at 07/24/17 2141  . docusate sodium (COLACE) capsule 100 mg  100 mg Oral BID Toni ArthursHewitt, John, MD   100 mg at 07/25/17 0953  . enoxaparin (LOVENOX) injection 40 mg  40 mg Subcutaneous Q24H Toni ArthursHewitt, John, MD   40 mg at 07/25/17 0747  . ferrous sulfate tablet 325 mg  325 mg Oral BID WC Toni ArthursHewitt, John, MD   325 mg at 07/25/17  [id number]  . gabapentin (NEURONTIN) capsule 1,200 mg  1,200 mg Oral TID Toni ArthursHewitt, John, MD   1,200 mg at 07/25/17 0747  . HYDROmorphone (DILAUDID) injection 1 mg  1 mg Intravenous Q2H PRN Toni ArthursHewitt, John, MD      . insulin aspart (novoLOG) injection 0-15 Units  0-15 Units Subcutaneous TID WC Hillary BowGardner, Jared M, DO   3 Units at 07/25/17 [phone]  . ketotifen (ZADITOR) 0.025 % ophthalmic solution 1 drop  1 drop Both Eyes BID Toni ArthursHewitt, John, MD   1 drop at 07/25/17 0954  . methocarbamol (ROBAXIN) tablet 500 mg  500 mg Oral Q6H PRN Toni ArthursHewitt, John, MD   500 mg at 07/25/17 0444   Or  . methocarbamol (ROBAXIN) 500 mg in dextrose 5 % 50 mL IVPB  500 mg Intravenous Q6H PRN Toni ArthursHewitt, John, MD 110 mL/hr at 07/21/17 2010 500 mg at 07/21/17 2010  . metoCLOPramide (REGLAN) tablet 5-10 mg  5-10 mg Oral Q8H PRN Toni ArthursHewitt, John, MD       Or  . metoCLOPramide (REGLAN) injection 5-10 mg  5-10 mg Intravenous Q8H PRN Toni ArthursHewitt, John, MD      . morphine (MS CONTIN) 12 hr tablet 15 mg  15 mg Oral Q12H Toni ArthursHewitt, John, MD   15 mg at 07/25/17 [id number]  . ondansetron (ZOFRAN) tablet 4 mg  4 mg Oral Q6H PRN Toni ArthursHewitt, John, MD       Or  . ondansetron [facility](ZOFRAN) injection 4 mg  4 mg Intravenous Q6H PRN Toni ArthursHewitt, John, MD      . oxyCODONE (Oxy IR/ROXICODONE) immediate release tablet 10 mg  10 mg Oral Q3H PRN Toni ArthursHewitt, John, MD   10 mg at 07/25/17 0748  . oxyCODONE (Oxy IR/ROXICODONE) immediate release tablet 5 mg  5 mg Oral Q3H PRN Toni ArthursHewitt, John, MD      . polyethylene glycol (MIRALAX / GLYCOLAX) packet 17 g  17 g Oral Daily PRN Toni ArthursHewitt, John, MD      . senna (SENOKOT) tablet 8.6 mg  1 tablet Oral BID Toni ArthursHewitt, John, MD   8.6 mg at 07/25/17 0953  . simvastatin (ZOCOR) tablet 10 mg  10 mg Oral QHS Toni ArthursHewitt, John, MD   10 mg at 07/24/17 2141  . traZODone (DESYREL) tablet 25 mg  25 mg Oral QHS PRN Toni ArthursHewitt, John, MD   25 mg at 07/22/17 2031  . venlafaxine (EFFEXOR) tablet 75 mg  75 mg Oral BID WC Toni ArthursHewitt, John, MD   75 mg at 07/25/17 [id number]     Discharge Medications: Please  see discharge summary for a list of discharge medications.  Relevant Imaging Results:  Relevant Lab Results:   Additional Information SSN:[id number]  Clearance CootsNicole A Nychelle Cassata, LCSW

## 2017-07-25 NOTE — Progress Notes (Signed)
Jamse Arn, MD  Physician  Physical Medicine and Rehabilitation  PMR Pre-admission  Signed  Date of Service:  07/25/2017 12:16 PM       Related encounter: ED to Hosp-Admission (Discharged) from 07/21/2017 in [place]            [] Hide copied text  [] Hover for details     Secondary [employer] PMR Admission Coordinator Pre-Admission Assessment  Patient: Justin Stone is an 56 y.o., male MRN: [id number] DOB: 08-Jun-1961 Height: 5' 8"  (172.7 cm) Weight: 113.4 kg (250 lb)  Insurance Information HMO: Yes   PPO:       PCP:       IPA:       80/20:       OTHER: Group # [id number] PRIMARY:  [facility]      Policy#: [id number]      Subscriber:  patient CM Name: Justin Stone      Phone#:  [phone]     Fax#: [phone] Pre-Cert#: [id number] with updates q7days      Employer:  Disabled Benefits:  Phone #: [phone]     Name:  Automated Eff. Date: 07/26/16     Deduct:  $0      Out of Pocket Max: [phone] (met $0)      Life Max: N/A CIR: $0 days 1-90      SNF: $0 days 1-20; $167.50 days 21-100 Outpatient: medical necessity     Co-Pay: none Home Health: 100%      Co-Pay: none DME: 80%     Co-Pay: 20% Providers: in network  SECONDARY: Medicaid of [place]      Policy#: [id number]      Subscriber: Justin Stone CM Name:        Phone#:       Fax#:   Pre-Cert#:        Employer:  disabled Benefits:  Phone #: [phone]     Name:  Automated Eff. Date: Eligible 07/25/17 with coverage code [id number]     Deduct:        Out of Pocket Max:        Life Max:   CIR:        SNF:   Outpatient:       Co-Pay:   Home Health:        Co-Pay:   DME:       Co-Pay:    Emergency Contact Information        Contact Information    Name Relation Home Work [place], [place] Brother   [phone]      Current Medical History  Patient Admitting Diagnosis:  New L BKA, old R BKA with prosthesis  History of Present Illness:  A  56 year old right-handed male with history of diabetes mellitus,chronic pain syndrome,hypertension, right BKA several years ago.Patient lives alone. Independent with a cane prior to admission as well as right prosthesis.Presented 07/21/2017 with ulceration to left foot and no change with conservative care.X-rays of left foot consistent with cellulitis and osteomyelitis. Underwent left BKA 07/21/2017 per Dr. Doran Durand. Hospital course pain management. Intermittent bouts of confusion with narcotics adjusted. Acute blood loss anemia 10.2 and monitored. Leukocytosis 16,000. Subcutaneous Lovenox for DVT prophylaxis. Physical and occupational therapy evaluations completed with recommendations of physical medicine rehabilitation consult. Patient to be admitted for comprehensive inpatient rehabilitation program.  Patient's medical record from  [facility] has been reviewed by the rehabilitation admission coordinator and  physician.  Past Medical History      Past Medical History:  Diagnosis Date  . Allergy    eye allergies  . Anxiety   . Arthritis   . Diabetes mellitus without complication ([place])    takes oral meds now only  . GERD (gastroesophageal reflux disease)    past hx > 2 yrs ago  . Hepatitis    "Hepatitis C"- tx. with [facility]- now testing negative.  . Hyperlipidemia   . Hypertension   . Neuromuscular disorder (HCC)    neuropathy hands/ feet.  . Osteopenia   . Prosthesis adjustment    right leg- below knee(weighs 10 lbs.)    Family History   family history includes Diabetes in his maternal grandmother and maternal uncle.  Prior Rehab/Hospitalizations Has the patient had major surgery during 100 days prior to admission? No               Current Medications See current MAR from [facility]   Patients Current Diet:   Carb mod med calorie thin liquids  Precautions / Restrictions Precautions Precautions: Fall Restrictions Weight Bearing  Restrictions: Yes LLE Weight Bearing: Non weight bearing Other Position/Activity Restrictions: has R prosthetic leg however did not feel able to stand today   Has the patient had 2 or more falls or a fall with injury in the past year?Yes.  Patient reports only one fall most recently but with injury.  Prior Activity Level Limited Community (1-2x/wk): Went out 3 X a week  Prior Functional Level Self Care: Did the patient need help bathing, dressing, using the toilet or eating?  Independent  Indoor Mobility: Did the patient need assistance with walking from room to room (with or without device)? Independent  Stairs: Did the patient need assistance with internal or external stairs (with or without device)? Independent  Functional Cognition: Did the patient need help planning regular tasks such as shopping or remembering to take medications? Independent  Home Assistive Devices / Equipment Home Assistive Devices/Equipment: Eyeglasses, Other (Comment)(right leg prosthesis) Home Equipment: Cane - single point  Prior Device Use: Indicate devices/aids used by the patient prior to current illness, exacerbation or injury? Crutches and cane.   Prior Functional Level Current Functional Level  Bed Mobility  Independent  Mod assist   Transfers  Independent  Min assist   Mobility - Walk/Wheelchair  Independent    Upper Body Dressing  Independent  Other(Set up)   Lower Body Dressing  Independent  Max assist   Grooming  Independent  Other(set up)   Eating/Drinking  Independent  Other(set up)   Toilet Transfer  Independent  Mod assist   Bladder Continence   WDL  Voiding in urinal   Bowel Management  WDL  Last BM 07/25/17   Stair Health and safety inspector  Verbal, is mildly confused   Memory  WDL  Mild confusion   Cooking/Meal Prep  Independent      Housework  Independent    Money  Management  Independent    Driving       Special needs/care consideration BiPAP/CPAP No CPM No Continuous Drip IV No Dialysis No        Life Vest No Oxygen No Special Bed no Trach Size No Wound Vac (area) No     Skin Has hard brace on left new BKA site  Bowel mgmt: Last BM 07/25/17 Bladder mgmt: Voiding in urinal Diabetic mgmt Yes, on metformin oral medication at home  Previous Home Environment Living Arrangements: [place]: One level Home Access: Level entry [place]: No  Discharge Living Setting Plans for Discharge Living Setting: Patient's home, Alone(Lives alone.) Type of Home at Discharge: House Discharge Home Layout: One level Discharge Home Access: Stairs to enter, Ramped entrance(Ramp to be built.) Entrance Stairs-Number of Steps: 1 step but ramp to be built Does the patient have any problems obtaining your medications?: No  Social/Family/Support Systems Patient Roles: Other (Comment)(Has a brother.) Contact Information: Rayburn Felt - brother - [phone] Caregiver Availability: Intermittent Discharge Plan Discussed with Primary Caregiver: No(Discussed with patient) Is Caregiver In Agreement with Plan?: Yes(Patient is in agreement.) Does Caregiver/Family have Issues with Lodging/Transportation while Pt is in Rehab?: No  Goals/Additional Needs Patient/Family Goal for Rehab: PT/OT mod I goals Expected length of stay: 7-10 days Cultural Considerations: None Dietary Needs: Carb mod, med cal, thin liquids Equipment Needs: TBD Pt/Family Agrees to Admission and willing to participate: Yes Program Orientation Provided & Reviewed with Pt/Caregiver Including Roles  & Responsibilities: Yes  Patient Condition:   I met with patient and I reviewed his medical record.  Both PT and OT are recommending inpatient rehab admission.  Patient can tolerate and will benefit from 3 hours of therapy a day.  He needs the  coordinated team approach of inpatient rehab to manage his pain and to help him regain function prior to discharge home.  I have approval from rehab MD to admit today to inpatient rehab.  Preadmission Screen Completed By:  Retta Diones, 07/25/2017 12:42 PM ______________________________________________________________________   Discussed status with Dr. Posey Pronto on  07/25/17 at 27 and received telephone approval for admission today.  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 Does the need for close, 24 hr/day  Medical supervision in concert with the patient's rehab needs make it unreasonable for this patient to be served in a less intensive setting? Yes  2. Co-Morbidities requiring supervision/potential complications: diabetes mellitus (Monitor in accordance with exercise and adjust meds as necessary),acute on chronic pain syndrome (Biofeedback training with therapies to help reduce reliance on opiate pain medications, monitor pain control during therapies, and sedation at rest and titrate to maximum efficacy to ensure participation and gains in therapies), HTN (monitor and provide prns in accordance with increased physical exertion and pain), leukocytosis (cont to monitor for signs and symptoms of infection, further workup if indicated), Acute blood loss anemia (transfuse if necessary to ensure appropriate perfusion for increased activity tolerance), Tachycardia (monitor in accordance with pain and increasing activity), hyponatremia (cont to monitor, treat if necessary), AKI (avoid nephrotoxic meds) 3. Due to bladder management, safety, skin/wound care, disease management, pain management and patient education, does the patient require 24 hr/day rehab nursing? Yes 4. Does the patient require coordinated care of a physician, rehab nurse, PT (1-2 hrs/day, 5 days/week) and OT (1-2 hrs/day, 5 days/week)  to address physical and functional deficits in the context of the above medical diagnosis(es)? Yes Addressing deficits in the following areas: balance, endurance, locomotion, strength, transferring, bathing, dressing, toileting and psychosocial support 5. Can the patient actively participate in an intensive therapy program of at least 3 hrs of therapy 5 days a week? Yes 6. The potential for patient to make measurable gains while on inpatient rehab is excellent 7. Anticipated functional  outcomes upon discharge from inpatients are: Mod I at wheelchair level PT, Mod I at wheelchair level OT, n/a SLP 8. Estimated rehab length of stay to reach the above functional goals is: 6-9 days. 9. Does the patient have adequate social supports to accommodate these discharge functional goals? Potentially 10. Anticipated D/C setting: Home 11. Anticipated post D/C treatments: HH therapy and Home excercise program 12. Overall Rehab/Functional Prognosis: good   RECOMMENDATIONS: This patient's condition is appropriate for continued rehabilitative care in the following setting: CIR Patient has agreed to participate in recommended program. Yes Note that insurance prior authorization may be required for reimbursement for recommended care.  Delice Lesch, MD, ABPMR Retta Diones 07/25/2017          Revision History

## 2017-07-25 NOTE — Discharge Summary (Signed)
Physician Discharge Summary  JSON KOELZER YBO:[id number] DOB: 06/05/1961 DOA: 07/21/2017  PCP: Darreld Mclean, MD  Admit date: 07/21/2017 Discharge date: 07/25/2017  Time spent: 35 * minutes  Recommendations for Outpatient Follow-up:  1. Follow-up Dr. Doran Durand, orthopedics in 2 weeks 2. Patient will be discharged on DVT prophylaxis with Lovenox 40 mg subcutaneous daily for 2 weeks, then switch to aspirin 81 mg by mouth twice a day. Further management as per Dr. Doran Durand as outpatient.   Discharge Diagnoses:  Principal Problem:   Diabetic wet gangrene of the foot [facility]) Active Problems:   Essential hypertension, benign   Type II diabetes mellitus, well controlled ([place])   S/P BKA (below knee amputation), right (HCC)   Gangrene of left foot (HCC)   Unilateral complete BKA, left, initial encounter ([place])   Osteomyelitis of left foot ([place])   AKI (acute kidney injury) ([place])   Discharge Condition: Stable  Diet recommendation: Carbohydrate modified diet  Filed Weights   07/21/17 1155  Weight: 113.4 kg (250 lb)    History of present illness:  56 year old male with a history of diabetes mellitus, hypertension, hyperlipidemia, chronic pain presenting from his primary care provider's office on 07/21/2017 secondary to a necrotic left foot wound. Unfortunately, the patient is a poor historian and is unable to provide chronicity of events. According to him, he has had a wound on his left foot since October 2018. He cannot clarify whether he has been on any antibiotics or how the wound came about, although he states that he has had previously followed up with his primary care provider regarding a foot wound. Apparently, the patient most recently saw his primary care provider on July 06, 2017 for a fall, but he did not mention anything about his left foot wound. Nevertheless, the patient had a gangrenous left foot when evaluated in the emergency department. Orthopedics, Dr. Doran Durand was  consulted who felt that the foot was not salvageable. 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      Hospital Course:   1. Gangrene left foot, status post left BKA. orthopedics  followed. Recommend CIR 2. Acute kidney injury-improved, baseline creatinine 1.0, came with creatinine 1.55. Lisinopril on hold. Started on IV fluids. Today creatinine is 1.08. Will restart home medications. 3. Hypertension-continue amlodipine 4. Diabetes mellitus- continue sliding scale insulin with NovoLog. 5. Chronic pain syndrome- continue MS Contin, OxyIR, gabapentin    Procedures:  Status post left BKA  Consultations:  Orthopedics  Discharge Exam: Vitals:   07/25/17 0438 07/25/17 0953  BP: (!) 145/69 (!) 153/80  Pulse: (!) 108 100  Resp: 18   Temp: 98.9 F (37.2 C)   SpO2: 98%     General: Appears in no acute distress Cardiovascular: S1-S2, regular Respiratory: Clear bilaterally  Discharge Instructions   Discharge Instructions    Diet - low sodium heart healthy   Complete by:  As directed    Increase activity slowly   Complete by:  As directed    Non weight bearing   Complete by:  As directed    OK to remove stump protector for PT ROM of knee.  OK to remove dressing and convert to stump shrinker sock.   Laterality:  left   Extremity:  Lower     Allergies as of 07/25/2017   No Known Allergies     Medication List    TAKE these medications   amLODipine 10 MG tablet Commonly known as:  NORVASC TAKE 1 TABLET(10 MG) BY MOUTH DAILY  aspirin EC 81 MG tablet Take 81 mg by mouth daily.   azelastine 0.05 % ophthalmic solution Commonly known as:  OPTIVAR Place 1 drop into both eyes 2 (two) times daily.   blood glucose meter kit and supplies Dispense based on patient and insurance preference. Use up to four times daily as directed. (FOR ICD-9 250.00, 250.01).   carisoprodol 350 MG tablet Commonly known as:  SOMA Take 1 tablet (350 mg total) by mouth 3 (three) times  daily.   enoxaparin 40 MG/0.4ML injection Commonly known as:  LOVENOX Inject 0.4 mLs (40 mg total) into the skin daily for 14 days. Start taking on:  07/26/2017   ferrous sulfate 325 (65 FE) MG tablet Take 1 tablet (325 mg total) by mouth 2 (two) times daily with a meal. If constipation take just one a day   furosemide 40 MG tablet Commonly known as:  LASIX TAKE 1 TABLET(40 MG) BY MOUTH DAILY   gabapentin 300 MG capsule Commonly known as:  NEURONTIN Take 4 capsules (1,200 mg total) by mouth 3 (three) times daily.   glucose blood test strip Commonly known as:  ONETOUCH VERIO Check blood sugar daily as directed.   Lancet Device Misc Use as directed twice a day.  Patient has one touch delica lancets, please give appropriate device.   lisinopril 10 MG tablet Commonly known as:  PRINIVIL,ZESTRIL TAKE 1 TABLET BY MOUTH DAILY   metFORMIN 500 MG tablet Commonly known as:  GLUCOPHAGE TAKE 2 TABLETS BY MOUTH IN THE MORNING AND 1 TABLET IN THE EVENING   methocarbamol 500 MG tablet Commonly known as:  ROBAXIN Take 1 tablet (500 mg total) by mouth every 6 (six) hours as needed for muscle spasms.   morphine 15 MG 12 hr tablet Commonly known as:  MS CONTIN Take 1 tablet (15 mg total) by mouth every 12 (twelve) hours.   NARCAN 4 MG/0.1ML Liqd nasal spray kit Generic drug:  naloxone CALL 911 AND U 1 SPR IN 1 NOS . REPEAT AFTRER 3 MIN IF NO OR MINIMAL RESPONSE   ONETOUCH DELICA LANCETS 40J Misc USE TO TEST BLOOD SUGAR TWICE DAILY   Oxycodone HCl 10 MG Tabs Take 0.5-1 tablets (5-10 mg total) by mouth every 4 (four) hours as needed. This is a 30 day supply.  To fill 60 days after rx   polyethylene glycol packet Commonly known as:  MIRALAX / GLYCOLAX Take 17 g by mouth daily as needed for mild constipation.   senna 8.6 MG Tabs tablet Commonly known as:  SENOKOT Take 1 tablet (8.6 mg total) by mouth 2 (two) times daily.   sildenafil 100 MG tablet Commonly known as:  VIAGRA Take  0.5-1 tablets (50-100 mg total) by mouth daily as needed for erectile dysfunction.   simvastatin 10 MG tablet Commonly known as:  ZOCOR Take 1 tablet (10 mg total) by mouth at bedtime.   SYSTANE OP Place 1 drop into both eyes daily as needed (dry eyes).   traZODone 50 MG tablet Commonly known as:  DESYREL Take 0.5 tablets (25 mg total) by mouth at bedtime as needed for sleep.   venlafaxine 75 MG tablet Commonly known as:  EFFEXOR Take 1 tablet (75 mg total) by mouth 2 (two) times daily with a meal. What changed:  See the new instructions.            Discharge Care Instructions  (From admission, onward)        Start     Ordered  07/25/17 0000  Non weight bearing    Comments:  OK to remove stump protector for PT ROM of knee.  OK to remove dressing and convert to stump shrinker sock.  Question Answer Comment  Laterality left   Extremity Lower      07/25/17 0801     No Known Allergies Follow-up Information    Wylene Simmer, MD. Schedule an appointment as soon as possible for a visit in 2 week(s).   Specialty:  Orthopedic Surgery Contact information: [address] [place] [address] [place] [address] [phone]            The results of significant diagnostics from this hospitalization (including imaging, microbiology, ancillary and laboratory) are listed below for reference.    Significant Diagnostic Studies: Dg Chest 2 View  Result Date: 07/06/2017 CLINICAL DATA:  Fall 4 days ago.  Sudden left-sided rib pain. EXAM: CHEST  2 VIEW COMPARISON:  One-view chest x-ray 02/11/2015 FINDINGS: The heart size is exaggerated by low lung volumes. dextroconvex scoliosis is present in the lower thoracic spine. Interstitial and airspace disease is present at the lung bases bilaterally. There is no acute or healing rib fracture. There is no pneumothorax. IMPRESSION: 1. Bilateral interstitial and airspace disease. While this may reflect atelectasis, infection is not  excluded. 2. No acute or healing rib fracture. 3. Stable scoliosis. Electronically Signed   By: San Morelle M.D.   On: 07/06/2017 13:36   Dg Ribs Unilateral Left  Result Date: 07/06/2017 CLINICAL DATA:  Fall 12 days ago.  Left-sided pain since. EXAM: LEFT RIBS - 2 VIEW COMPARISON:  Two-view chest x-ray from the same day. One-view chest x-ray 02/11/2015. FINDINGS: No acute or healing left-sided rib fractures are present. There is no pneumothorax. Left hemithorax is clear. 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 IMPRESSION: 1. No acute or healing left rib fractures. 2. Chronic remote left AC separation. Electronically Signed   By: San Morelle M.D.   On: 07/06/2017 13:35   Dg Foot Complete Left  Result Date: 07/21/2017 CLINICAL DATA:  Left foot and infection. Decreased range of motion. History of cirrhosis, diabetes, peripheral vascular disease with below the knee amputation on the right, former smoker. EXAM: LEFT FOOT - COMPLETE 3+ VIEW COMPARISON:  None in PACs FINDINGS: There is markedly abnormal appearance of the soft tissues overlying the fifth MTP joint. Gas is present within the soft tissues. There is deformity of the head of the fifth metatarsal. The base of the proximal phalanx cannot be well evaluated. The distal phalanx appears normal. The first through fourth rays exhibit no acute abnormalities. There is degenerative change of the first MTP joint. The tarsometatarsal and intertarsal joints appear normal. There are plantar and Achilles region calcaneal spurs. There are arterial calcifications. IMPRESSION: Findings consistent with cellulitis and osteomyelitis of the distal aspect of the fifth metatarsal. Involvement of the base of the proximal phalanx may be present. There is a fracture through the head of the fifth metatarsal that may be pathologic or post traumatic. Electronically Signed   By: David  Martinique M.D.   On: 07/21/2017 13:07     Microbiology: No results found for this or any previous visit (from the past 240 hour(s)).   Labs: Basic Metabolic Panel: Recent Labs  Lab 07/21/17 1229 07/22/17 0529 07/23/17 0558  NA 128* 131* 131*  K 3.9 4.0 4.3  CL 96* 100* 99*  CO2 19* 23 22  GLUCOSE 165* 172* 133*  BUN 33* 24* 19  CREATININE 1.55* 1.26* 1.08  CALCIUM 9.4 9.0 8.8*   Liver Function Tests: No results for input(s): AST, ALT, ALKPHOS, BILITOT, PROT, ALBUMIN in the last 168 hours. No results for input(s): LIPASE, AMYLASE in the last 168 hours. No results for input(s): AMMONIA in the last 168 hours. CBC: Recent Labs  Lab 07/21/17 1305 07/22/17 0529 07/23/17 0558  WBC 17.7* 16.8* 16.0*  NEUTROABS 14.4*  --   --   HGB 10.5* 9.6* 10.2*  HCT 32.1* 29.7* 31.5*  MCV 95.5 97.1 97.8  PLT 264 238 294    CBG: Recent Labs  Lab 07/24/17 1220 07/24/17 1706 07/24/17 2211 07/25/17 0726 07/25/17 1154  GLUCAP 154* 123* 117* 168* 152*       Signed:  Oswald Hillock MD.  [facility] 07/25/2017, 12:06 PM

## 2017-07-25 NOTE — Progress Notes (Signed)
Patient ID: [name], male   DOB: July 29, 1960, 56 y.o.   MRN: [id number] Patient admitted to 4M05 [facility] stretcher[facility]  Patient oriented to unit, verbalized understanding of falls policy.  Patient appears to be in no immediate distress at this time.  [name], Shanin Szymanowski J, RN

## 2017-07-25 NOTE — Progress Notes (Signed)
Subjective: 4 Days Post-Op Procedure(s) (LRB): LEFT  BELOW KNEE AMPUTATION (Left) Patient reports pain as moderate.  Pt c/o that he doesn't get his normal pain meds which he takes at home.  Objective: Vital signs in last 24 hours: Temp:  [97.3 F (36.3 C)-98.9 F (37.2 C)] 98.9 F (37.2 C) (12/31 0438) Pulse Rate:  [100-108] 108 (12/31 0438) Resp:  [18-19] 18 (12/31 0438) BP: (145-155)/(67-76) 145/69 (12/31 0438) SpO2:  [97 %-99 %] 98 % (12/31 0438)  Intake/Output from previous day: 12/30 0701 - 12/31 0700 In: 1750 [P.O.:1650] Out: 1401 [Urine:1400; Stool:1] Intake/Output this shift: No intake/output data recorded.  Recent Labs    07/23/17 0558  HGB 10.2*   Recent Labs    07/23/17 0558  WBC 16.0*  RBC 3.22*  HCT 31.5*  PLT 294   Recent Labs    07/23/17 0558  NA 131*  K 4.3  CL 99*  CO2 22  BUN 19  CREATININE 1.08  GLUCOSE 133*  CALCIUM 8.8*   No results for input(s): LABPT, INR in the last 72 hours.  PE:  wn wd male in nad.  A and O x 4.  L LE dressed and dry..  Stump protector in place.  Assessment/Plan: 4 Days Post-Op Procedure(s) (LRB): LEFT  BELOW KNEE AMPUTATION (Left) Discharge to SNF when bed available.  F/u with me in two weeks for suture removal.  Kissa Campoy 07/25/2017, 7:58 AM

## 2017-07-25 NOTE — Telephone Encounter (Signed)
I got a phone note from yesterday that pt called with concerns about his pain medication and wanting to be discharged.  It looks however like he is being discharged today  I called him- he is still in the hospital but he thinks he is being discharged to rehab today. He is not sure but thinks he will be there for a couple of weeks. We discussed his wound again today, told him that I wondered if he had been afraid to tell me about the wound.  Today he states that he was really not aware it was there until it got really bad.   He thinks he will need an elevated commode seat for when he is discharged to home. Assume this will be coordinated through rehab but will continue to check in with him

## 2017-07-25 NOTE — PMR Pre-admission (Signed)
[facility] PMR Admission Coordinator Pre-Admission Assessment  Patient: Justin Stone is an 56 y.o., male MRN: [id number] DOB: 11-14-1960 Height: 5' 8"  (172.7 cm) Weight: 113.4 kg (250 lb)  Insurance Information HMO: Yes   PPO:       PCP:       IPA:       80/20:       OTHER: Group # [id number] PRIMARY:  [place]      Policy#: [id number]      Subscriber:  patient CM Name: Vevelyn Royals      Phone#:  [phone]     Fax#: [phone] Pre-Cert#: [id number] with updates q7days      Employer:  Disabled Benefits:  Phone #: [phone]     Name:  Automated Eff. Date: 07/26/16     Deduct:  $0      Out of Pocket Max: [phone] (met $0)      Life Max: N/A CIR: $0 days 1-90      SNF: $0 days 1-20; $167.50 days 21-100 Outpatient: medical necessity     Co-Pay: none Home Health: 100%      Co-Pay: none DME: 80%     Co-Pay: 20% Providers: in network  SECONDARY: Medicaid of [place]      Policy#: [id number]      Subscriber: Francella Solian CM Name:        Phone#:       Fax#:   Pre-Cert#:        Employer:  disabled Benefits:  Phone #: [phone]     Name:  Automated Eff. 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 Patient lives alone. Independent with a cane prior to admission as well as right prosthesis. Presented 07/21/2017 with ulceration to left foot and no change with conservative care. X-rays of left foot consistent with cellulitis and osteomyelitis.  Underwent left BKA 07/21/2017 per Dr. Doran Durand. Hospital course pain management. Intermittent bouts of confusion with narcotics adjusted. Acute blood loss anemia 10.2 and monitored. Leukocytosis 16,000. Subcutaneous Lovenox for DVT prophylaxis. Physical and occupational therapy evaluations completed with recommendations of physical medicine rehabilitation consult. Patient to be admitted for comprehensive inpatient rehabilitation program.  Patient's medical record from  [facility] has been reviewed by the rehabilitation admission coordinator and physician.  Past Medical History  Past Medical History:  Diagnosis Date  . Allergy    eye allergies  . Anxiety   . Arthritis   . Diabetes mellitus without complication ([place])    takes oral meds now only  . GERD (gastroesophageal reflux disease)    past hx > 2 yrs ago  . Hepatitis    "Hepatitis C"- tx. with Harv[facility]- now testing negative.  Marland Kitchen  Hyperlipidemia   . Hypertension   . Neuromuscular disorder (HCC)    neuropathy hands/ feet.  . Osteopenia   . Prosthesis adjustment    right leg- below knee(weighs 10 lbs.)    Family History   family history includes Diabetes in his maternal grandmother and maternal uncle.  Prior Rehab/Hospitalizations Has the patient had major surgery during 100 days prior to admission? No    Current Medications See current MAR from [facility]   Patients Current Diet:   Carb mod med calorie thin liquids  Precautions / Restrictions Precautions Precautions: Fall Restrictions Weight Bearing Restrictions: Yes LLE Weight Bearing: Non weight bearing Other Position/Activity Restrictions: has R prosthetic leg however did not feel able to stand today   Has the patient had 2 or more falls or a fall with injury in the past year?Yes.  Patient reports only one fall most recently but with injury.  Prior Activity Level Limited Community (1-2x/wk): Went out 3 X a week  Prior Functional Level Self Care: Did the  patient need help bathing, dressing, using the toilet or eating?  Independent  Indoor Mobility: Did the patient need assistance with walking from room to room (with or without device)? Independent  Stairs: Did the patient need assistance with internal or external stairs (with or without device)? Independent  Functional Cognition: Did the patient need help planning regular tasks such as shopping or remembering to take medications? Independent  Home Assistive Devices / Equipment Home Assistive Devices/Equipment: Eyeglasses, Other (Comment)(right leg prosthesis) Home Equipment: Cane - single point  Prior Device Use: Indicate devices/aids used by the patient prior to current illness, exacerbation or injury? Crutches and cane.   Prior Functional Level Current Functional Level  Bed Mobility  Independent  Mod assist   Transfers  Independent  Min assist   Mobility - Walk/Wheelchair  Independent      Upper Body Dressing  Independent  Other(Set up)   Lower Body Dressing  Independent  Max assist   Grooming  Independent  Other(set up)   Eating/Drinking  Independent  Other(set up)   Toilet Transfer  Independent  Mod assist   Bladder Continence   WDL  Voiding in urinal   Bowel Management  WDL  Last BM 07/25/17   Stair Psychologist, sport and exercise  Verbal, is mildly confused   Memory  WDL  Mild confusion   Cooking/Meal Prep  Independent      Housework  Independent    Money Management  Independent    Driving         Special needs/care consideration BiPAP/CPAP No CPM No Continuous Drip IV No Dialysis No        Life Vest No Oxygen No Special Bed no Trach Size No Wound Vac (area) No     Skin Has hard brace on left new BKA site                            Bowel mgmt: Last BM 07/25/17 Bladder mgmt: Voiding in urinal Diabetic mgmt Yes, on metformin oral medication at home  Previous Home Environment Living Arrangements:  [place]: One level Home Access: Level entry [place]: No  Discharge Living Setting Plans for Discharge Living Setting: Patient's home, Alone(Lives alone.) Type of Home at Discharge: House Discharge Home Layout: One level Discharge Home Access: Stairs to enter, Ramped entrance(Ramp to be built.) Entrance Stairs-Number of Steps: 1 step but  ramp to be built Does the patient have any problems obtaining your medications?: No  Social/Family/Support Systems Patient Roles: Other (Comment)(Has a brother.) Contact Information: Rayburn Felt - brother - [phone] Caregiver Availability: Intermittent Discharge Plan Discussed with Primary Caregiver: No(Discussed with patient) Is Caregiver In Agreement with Plan?: Yes(Patient is in agreement.) Does Caregiver/Family have Issues with Lodging/Transportation while Pt is in Rehab?: No  Goals/Additional Needs Patient/Family Goal for Rehab: PT/OT mod I goals Expected length of stay: 7-10 days Cultural Considerations: None Dietary Needs: Carb mod, med cal, thin liquids Equipment Needs: TBD Pt/Family Agrees to Admission and willing to participate: Yes Program Orientation Provided & Reviewed with Pt/Caregiver Including Roles  & Responsibilities: Yes  Patient Condition:   I met with patient and I reviewed his medical record.  Both PT and OT are recommending inpatient rehab admission.  Patient can tolerate and will benefit from 3 hours of therapy a day.  He needs the coordinated team approach of inpatient rehab to manage his pain and to help him regain function prior to discharge home.  I have approval from rehab MD to admit today to inpatient rehab.  Preadmission Screen Completed By:  Retta Diones, 07/25/2017 12:42 PM ______________________________________________________________________   Discussed status with Dr. Posey Pronto on  07/25/17 at 33 and received telephone approval for admission today.  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 Does the need for close, 24 hr/day  Medical supervision in concert with the patient's rehab needs make it unreasonable for this patient to be served in a less intensive setting? Yes  2. Co-Morbidities requiring supervision/potential complications: diabetes mellitus (Monitor in accordance with exercise and adjust meds as necessary), acute on chronic pain syndrome (Biofeedback training with therapies to help reduce reliance on opiate pain medications, monitor pain control during therapies, and sedation at rest and titrate to maximum efficacy to ensure participation and gains in therapies), HTN (monitor and provide prns in accordance with increased physical exertion and pain), leukocytosis (cont to monitor for signs and symptoms of infection, further workup if indicated), Acute blood loss anemia (transfuse if necessary to ensure appropriate perfusion for increased activity tolerance), Tachycardia (monitor in accordance with pain and increasing activity), hyponatremia (cont to monitor, treat if necessary), AKI (avoid nephrotoxic meds) 3. Due to bladder management, safety, skin/wound care, disease management, pain management and patient education, does the patient require 24 hr/day rehab nursing? Yes 4. Does the patient require coordinated care of a physician, rehab nurse, PT (1-2 hrs/day, 5 days/week) and OT (1-2 hrs/day, 5 days/week) to address physical and functional deficits in the context of the above medical diagnosis(es)? Yes Addressing deficits in the following areas: balance, endurance, locomotion, strength, transferring, bathing, dressing, toileting and psychosocial support 5. Can the patient actively participate in an intensive therapy program of at least 3 hrs of therapy 5 days a week? Yes 6. The potential for patient to make measurable gains while on inpatient rehab is excellent 7. Anticipated  functional outcomes upon discharge from inpatients are: Mod I at wheelchair level PT, Mod I at wheelchair level OT, n/a SLP 8. Estimated rehab length of stay to reach the above functional goals is: 6-9 days. 9. Does the patient have adequate social supports to accommodate these discharge functional goals? Potentially 10. Anticipated D/C setting: Home 11. Anticipated post D/C treatments: HH therapy and Home excercise program 12. Overall Rehab/Functional Prognosis: good   RECOMMENDATIONS: This patient's condition is appropriate  for continued rehabilitative care in the following setting: CIR Patient has agreed to participate in recommended program. Yes Note that insurance prior authorization may be required for reimbursement for recommended care.  Delice Lesch, MD, ABPMR Jodell Cipro Jerilynn Mages 07/25/2017

## 2017-07-25 NOTE — Clinical Social Work Note (Signed)
Clinical Social Work Assessment  Patient Details  Name: Justin Stone MRN: [id number] Date of Birth: 1960/08/10  Date of referral:  07/25/17               Reason for consult:  Facility Placement                Permission sought to share information with:    Permission granted to share information::     Name::        Agency::     Relationship::   Brother    Contact Information:   Rayburn Felt- [phone]  Housing/Transportation Living arrangements for the past 2 months:  [place] of Information:  Patient Patient Interpreter Needed:  None Criminal Activity/Legal Involvement Pertinent to Current Situation/Hospitalization:  No - Comment as needed Significant Relationships:  None Lives with:    Do you feel safe going back to the place where you live?  Yes Need for family participation in patient care:  No  Care giving concerns: Patient presented with diabetic wet gangrene of Left foot.  Patient evaluated by physical therapist, recommendation [facility] CSW given permission to fax clinical information for SNF options. Patient prefers facility in [place] area.   Social Worker assessment / plan:  CSW met with patient at bedside, explain role and reason for visit-assist with discharge to SNF is patient is not approved to go to CIR. Patient informed CSW he is agreeable to SNF placement but "only for a short time."  CSW received call from Genie with CIR, she reports they will accept patient if he is agreeable. CSW informed patient of his choice CIR vs. SNF. Patient agreeable to complete CIR. Patient requested "written copy of information about he program to review. [facility] at [facility] faxed letter to patient to review.   Plan: CIR, nurse informed.   Employment status:  Disabled (Comment on whether or not currently receiving Disability) Insurance information:  Managed Medicare PT Recommendations:  Inpatient Rehab Consult, [place] /  Referral to community resources:  [place]  Patient/Family's Response to care:  Agreeable and Responding to care.   Patient/Family's Understanding of and Emotional Response to Diagnosis, Current Treatment, and Prognosis:  Patient knowledgeable of diagnosis and is learning about additional treatment via inquiring about additional information for PT follow up treatment.   Emotional Assessment Appearance:    Attitude/Demeanor/Rapport:    Affect (typically observed):  Accepting Orientation:  Oriented to Self, Oriented to Place, Oriented to  Time, Oriented to Situation Alcohol / Substance use:    Psych involvement (Current and /or in the community):     Discharge Needs  Concerns to be addressed:  Discharge Planning Concerns Readmission within the last 30 days:  No Current discharge risk:  Dependent with Mobility Barriers to Discharge:  No Barriers Identified   Lia Hopping, LCSW 07/25/2017, 12:26 PM

## 2017-07-26 ENCOUNTER — Inpatient Hospital Stay (HOSPITAL_COMMUNITY): Payer: Medicare Other | Admitting: Physical Therapy

## 2017-07-26 ENCOUNTER — Inpatient Hospital Stay (HOSPITAL_COMMUNITY): Payer: Medicare Other | Admitting: Occupational Therapy

## 2017-07-26 DIAGNOSIS — Z89512 Acquired absence of left leg below knee: Secondary | ICD-10-CM

## 2017-07-26 DIAGNOSIS — E1142 Type 2 diabetes mellitus with diabetic polyneuropathy: Secondary | ICD-10-CM

## 2017-07-26 DIAGNOSIS — E871 Hypo-osmolality and hyponatremia: Secondary | ICD-10-CM

## 2017-07-26 DIAGNOSIS — I10 Essential (primary) hypertension: Secondary | ICD-10-CM

## 2017-07-26 DIAGNOSIS — D72829 Elevated white blood cell count, unspecified: Secondary | ICD-10-CM

## 2017-07-26 DIAGNOSIS — Z89511 Acquired absence of right leg below knee: Secondary | ICD-10-CM

## 2017-07-26 DIAGNOSIS — G894 Chronic pain syndrome: Secondary | ICD-10-CM

## 2017-07-26 DIAGNOSIS — G8918 Other acute postprocedural pain: Secondary | ICD-10-CM

## 2017-07-26 DIAGNOSIS — D62 Acute posthemorrhagic anemia: Secondary | ICD-10-CM

## 2017-07-26 DIAGNOSIS — S88112S Complete traumatic amputation at level between knee and ankle, left lower leg, sequela: Secondary | ICD-10-CM

## 2017-07-26 LAB — CBC WITH DIFFERENTIAL/PLATELET
Basophils Absolute: 0 10*3/uL (ref 0.0–0.1)
Basophils Relative: 0 %
Eosinophils Absolute: 0.2 10*3/uL (ref 0.0–0.7)
Eosinophils Relative: 1 %
HEMATOCRIT: 35.6 % — AB (ref 39.0–52.0)
HEMOGLOBIN: 11.7 g/dL — AB (ref 13.0–17.0)
LYMPHS ABS: 1.8 10*3/uL (ref 0.7–4.0)
LYMPHS PCT: 14 %
MCH: 31.5 pg (ref 26.0–34.0)
MCHC: 32.9 g/dL (ref 30.0–36.0)
MCV: 96 fL (ref 78.0–100.0)
Monocytes Absolute: 1.2 10*3/uL — ABNORMAL HIGH (ref 0.1–1.0)
Monocytes Relative: 9 %
NEUTROS ABS: 10.1 10*3/uL — AB (ref 1.7–7.7)
NEUTROS PCT: 76 %
PLATELETS: 360 10*3/uL (ref 150–400)
RBC: 3.71 MIL/uL — AB (ref 4.22–5.81)
RDW: 14.7 % (ref 11.5–15.5)
WBC: 13.3 10*3/uL — AB (ref 4.0–10.5)

## 2017-07-26 LAB — COMPREHENSIVE METABOLIC PANEL
ALK PHOS: 115 U/L (ref 38–126)
ALT: 38 U/L (ref 17–63)
AST: 61 U/L — AB (ref 15–41)
Albumin: 2 g/dL — ABNORMAL LOW (ref 3.5–5.0)
Anion gap: 12 (ref 5–15)
BILIRUBIN TOTAL: 0.7 mg/dL (ref 0.3–1.2)
BUN: 18 mg/dL (ref 6–20)
CALCIUM: 8.9 mg/dL (ref 8.9–10.3)
CHLORIDE: 98 mmol/L — AB (ref 101–111)
CO2: 18 mmol/L — ABNORMAL LOW (ref 22–32)
CREATININE: 1.07 mg/dL (ref 0.61–1.24)
GFR calc Af Amer: >60 mL/min (ref >60)
Glucose, Bld: 146 mg/dL — ABNORMAL HIGH (ref 65–99)
Potassium: 4.2 mmol/L (ref 3.5–5.1)
Sodium: 128 mmol/L — ABNORMAL LOW (ref 135–145)
Total Protein: 7.7 g/dL (ref 6.5–8.1)

## 2017-07-26 LAB — GLUCOSE, CAPILLARY
GLUCOSE-CAPILLARY: 158 mg/dL — AB (ref 65–99)
GLUCOSE-CAPILLARY: 156 mg/dL — AB (ref 65–99)
GLUCOSE-CAPILLARY: 141 mg/dL — AB (ref 65–99)
GLUCOSE-CAPILLARY: 158 mg/dL — AB (ref 65–99)

## 2017-07-26 NOTE — Progress Notes (Signed)
[facility] PHYSICAL MEDICINE & REHABILITATION     PROGRESS NOTE  Subjective/Complaints:  Pt seen laying in bed this Am.  He slept well overnight.  He is ready to begin therapies.  ROS: Denies CP, SOB, N/V/D.  Objective: Vital Signs: Blood pressure (!) 165/80, pulse (!) 108, temperature 98.1 F (36.7 C), temperature source Oral, resp. rate 18, height 5\' 7"  (1.702 m), weight 113.9 kg (251 lb 1.7 oz), SpO2 97 %. No results found. Recent Labs    07/25/17 1607 07/26/17 0614  WBC 13.3* 13.3*  HGB 11.7* 11.7*  HCT 35.2* 35.6*  PLT 341 360   Recent Labs    07/25/17 1607 07/26/17 0614  NA  --  128*  K  --  4.2  CL  --  98*  GLUCOSE  --  146*  BUN  --  18  CREATININE 1.14 1.07  CALCIUM  --  8.9   CBG (last 3)  Recent Labs    07/25/17 1640 07/25/17 2125 07/26/17 0633  GLUCAP 134* 135* 158*    Wt Readings from Last 3 Encounters:  07/25/17 113.9 kg (251 lb 1.7 oz)  07/21/17 113.4 kg (250 lb)  07/21/17 113.8 kg (250 lb 12.8 oz)    Physical Exam:  BP (!) 165/80 (BP Location: Left Arm)   Pulse (!) 108   Temp 98.1 F (36.7 C) (Oral)   Resp 18   Ht 5\' 7"  (1.702 m)   Wt 113.9 kg (251 lb 1.7 oz)   SpO2 97%   BMI 39.33 kg/m  General: NAD. Vitals stable. HENT: Normocephalic. Atraumatic Eyes: EOMI. No discharge Cardiac. +Tachycardia. Regular rhythm.  No JVD. Respiratory. Clear to auscultation. Unlabored.  Abdomen: good bowel sounds. nondistended Neurological. Alert and oriented.  Followed full commands. Motor: B/l UE 5/5 proximal to distal LLE: HF 4/5 RLE: HF, KE 4+/5 Skin. Left BKA with lateral area of sanguinous drainage Right BKA with small friction area at base of stump.   Assessment/Plan: 1. Functional deficits secondary to bilateral BKA which require 3+ hours per day of interdisciplinary therapy in a comprehensive inpatient rehab setting. Physiatrist is providing close team supervision and 24 hour management of active medical problems listed  below. Physiatrist and rehab team continue to assess barriers to discharge/monitor patient progress toward functional and medical goals.  Function:  Bathing Bathing position      Bathing parts      Bathing assist        Upper Body Dressing/Undressing Upper body dressing                    Upper body assist        Lower Body Dressing/Undressing Lower body dressing                                  Lower body assist        Toileting Toileting          Toileting assist     Transfers Chair/bed transfer             Locomotion Ambulation           Wheelchair          Cognition Comprehension    Expression    Social Interaction    Problem Solving    Memory      Medical Problem List and Plan: 1.  Decreased functional mobility secondary to left BKA 07/21/2017 as  well as history of right BKA. Patient does use a right prosthesis   Begin CIR 2.  DVT Prophylaxis/Anticoagulation: Subcutaneous Lovenox. Monitor for any bleeding episodes 3. Pain Management/chronic pain: Soma 350 mg 3 times a day, Neurontin 1200 mg 3 times a day, MS Contin 15 mg every 12 hours, Robaxin and oxycodone as needed 4. Mood: Effexor 75 mg twice a day Trazodone 25 mg daily at bedtime as needed 5. Neuropsych: This patient is capable of making decisions on his own behalf. 6. Skin/Wound Care: Routine skin checks 7. Fluids/Electrolytes/Nutrition: Routine I&O's  8. Acute blood loss anemia. Continue iron supplement.    Hb 11.7 on 1/1   Cont to monitor 9. Diabetes mellitus and peripheral neuropathy. Hemoglobin A1c 6.5. Currently with SSI. Check blood sugars before meals and at bedtime. Diabetic teaching. Patient on Glucophage 500 mg twice a day prior to admission. Resume as needed   Monitor with increased mobility 10. Hypertension. Norvasc 10 mg daily.   Monitor with increased mobility 11. Hyperlipidemia. Zocor 12. Constipation. Laxative assistance 13. Hyponatremia   Na  128 on 1/1   Cont to monitor 14. Leukocytosis   Afebrile   WBCs 13.3 on 1/1   Cont to monitor  LOS (Days) 1 A FACE TO FACE EVALUATION WAS PERFORMED  Jalyssa Fleisher Karis Juba 07/26/2017 8:24 AM

## 2017-07-26 NOTE — Plan of Care (Signed)
Long term goals established 1/1

## 2017-07-26 NOTE — Plan of Care (Signed)
  Progressing Consults RH LIMB LOSS PATIENT EDUCATION Description Description: See Patient Education module for eduction specifics. 07/26/2017 1142 - Progressing by [name], Shaindy Reader J, RN Skin Care Protocol Initiated - if Braden Score 18 or less Description If consults are not indicated, leave blank or document N/A 07/26/2017 1142 - Progressing by [name], Lura Falor J, RN Diabetes Guidelines if Diabetic/Glucose > 140 Description If diabetic or lab glucose is > 140 mg/dl - Initiate Diabetes/Hyperglycemia Guidelines & Document Interventions  07/26/2017 1142 - Progressing by [name], Nahal Wanless J, RN RH BOWEL ELIMINATION RH STG MANAGE BOWEL WITH ASSISTANCE Description STG Manage Bowel with min Assistance.  07/26/2017 1142 - Progressing by [name], Clance Baquero J, RN RH STG MANAGE BOWEL W/MEDICATION W/ASSISTANCE Description STG Manage Bowel with Medication with min Assistance.  07/26/2017 1142 - Progressing by [name], Shahzad Thomann J, RN RH SKIN INTEGRITY RH STG SKIN FREE OF INFECTION/BREAKDOWN Description Patients skin will remain free from further infection or breakdown with mod assist.  07/26/2017 1142 - Progressing by [name], Chandell Attridge J, RN RH STG MAINTAIN SKIN INTEGRITY WITH ASSISTANCE Description STG Maintain Skin Integrity With mod Assistance.  07/26/2017 1142 - Progressing by [name], Martiza Speth J, RN RH STG ABLE TO PERFORM INCISION/WOUND CARE W/ASSISTANCE Description STG Able To Perform Incision/Wound Care With total Assistance.  07/26/2017 1142 - Progressing by [name], Keyairra Kolinski J, RN RH SAFETY RH STG ADHERE TO SAFETY PRECAUTIONS W/ASSISTANCE/DEVICE Description STG Adhere to Safety Precautions With mod I Assistance/Device.  07/26/2017 1142 - Progressing by [name], Shirleyann Montero J, RN RH PAIN MANAGEMENT RH STG PAIN MANAGED AT OR BELOW PT'S PAIN GOAL Description Pain managed with use of scheduled and PRN medications with min assist  07/26/2017 1142 - Progressing by [name], Alexx Giambra J, RN RH KNOWLEDGE DEFICIT  LIMB LOSS RH STG INCREASE KNOWLEDGE OF SELF CARE AFTER LIMB LOSS Description Patient will verbalize understanding of limb loss education materials as presented by staff with min assist.  07/26/2017 1142 - Progressing by [name], Roxana Lai J, RN

## 2017-07-26 NOTE — Progress Notes (Signed)
Occupational Therapy Assessment and Plan  Patient Details  Name: Justin Stone MRN: [id number] Date of Birth: August 12, 1960  OT Diagnosis: acute pain and muscle weakness (generalized) Rehab Potential: Rehab Potential (ACUTE ONLY): Good ELOS: 10-14   Today's Date: 07/26/2017 OT Individual Time: 1100-1200 OT Individual Time Calculation (min): 60 min     Problem List:  Patient Active Problem List   Diagnosis Date Noted  . Post-operative pain   . Type 2 diabetes mellitus with peripheral neuropathy (HCC)   . Amputation of left lower extremity below knee ([place]) 07/25/2017  . S/P bilateral BKA (below knee amputation) ([place])   . Unilateral complete BKA, left, sequela ([place])   . Chronic pain syndrome   . Benign essential HTN   . Leukocytosis   . Acute blood loss anemia   . Tachycardia   . Hyponatremia   . AKI (acute kidney injury) ([place]) 07/22/2017  . Osteomyelitis of left foot ([place])   . Diabetic wet gangrene of the foot ([place]) 07/21/2017  . Gangrene of left foot ([place]) 07/21/2017  . Unilateral complete BKA, left, initial encounter ([place]) 07/21/2017  . Insomnia 05/13/2016  . Umbilical hernia without obstruction and without gangrene 05/13/2016  . Spondylosis of lumbar region without myelopathy or radiculopathy 07/22/2015  . Obesity 06/29/2015  . 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 Type II diabetes mellitus, well controlled ([place]) 03/26/2015  . S/P BKA (below knee amputation), right ([place]) 03/26/2015  . Alcoholic cirrhosis of liver without ascites ([place]) 02/22/2015  . Chronic hepatitis C without hepatic coma ([place]) 02/22/2015  . Chronic low back pain 12/29/2014  . Essential hypertension, benign 12/29/2014  . Hyperlipidemia LDL goal <100 12/29/2014    Past Medical History:  Past Medical History:  Diagnosis Date  . Allergy    eye allergies  . Anxiety   . Arthritis   . Diabetes mellitus without complication ([place])    takes oral meds now only  . GERD  (gastroesophageal reflux disease)    past hx > 2 yrs ago  . Hepatitis    "Hepatitis C"- tx. with [facility]- now testing negative.  . Hyperlipidemia   . Hypertension   . Neuromuscular disorder (HCC)    neuropathy hands/ feet.  . Osteopenia   . Prosthesis adjustment    right leg- below knee(weighs 10 lbs.)   Past Surgical History:  Past Surgical History:  Procedure Laterality Date  . AMPUTATION Left 07/21/2017   Procedure: LEFT  BELOW KNEE AMPUTATION;  Surgeon: Wylene Simmer, MD;  Location: [facility];  Service: Orthopedics;  Laterality: Left;  . HERNIA REPAIR Left    LIH  . INSERTION OF MESH N/A 06/15/2016   Procedure: INSERTION OF MESH;  Surgeon: Greer Pickerel, MD;  Location: [facility];  Service: General;  Laterality: N/A;  . right leg removal     for diabetes, wears prosthesis -Right Below knee since '15  . UMBILICAL HERNIA REPAIR N/A 06/15/2016   Procedure: LAPAROSCOPIC ASSISTED REPAIR OF  UMBILICAL HERNIA;  Surgeon: Greer Pickerel, MD;  Location: [facility];  Service: General;  Laterality: N/A;    Assessment & Plan Clinical Impression: Patient is a 57 y.o. year old male with recent admission to the hospital on  57 year old right-handed male with history of diabetes mellitus, chronic pain syndrome, hypertension, right BKA several years ago. History taken from chart review and patient. Patient lives alone. Independent with a cane prior to admission as well as right prosthesis. Presented 07/21/2017 with ulceration to left foot and no  change with conservative care. X-rays of left foot consistent with cellulitis and osteomyelitis. Underwent left BKA 07/21/2017 per Dr. Doran Durand.  Patient currently requires min with basic self-care skills secondary to muscle weakness, decreased midline orientation, decreased attention to left and decreased attention to right and decreased sitting balance and decreased balance strategies.  Prior to hospitalization, patient could complete ADLS with modified independent  .  Patient will benefit from skilled intervention to increase independence with basic self-care skills prior to discharge home independently.  Anticipate patient will require intermittent supervision and minimal physical assistance and follow up home health.  OT - End of Session Activity Tolerance: Tolerates 30+ min activity with multiple rests Endurance Deficit: Yes Endurance Deficit Description: decreased activity tolerance during ADLs- requires rest breaks  OT Assessment Rehab Potential (ACUTE ONLY): Good OT Barriers to Discharge: Inaccessible home environment;Decreased caregiver support OT Barriers to Discharge Comments: home envionrment and functional IND with ADLs/safety awareness  OT Patient demonstrates impairments in the following area(s): Balance;Pain;Safety;Cognition;Sensory;Endurance;Motor OT Basic ADL's Functional Problem(s): Eating;Grooming;Bathing;Dressing;Toileting OT Advanced ADL's Functional Problem(s): Simple Meal Preparation OT Transfers Functional Problem(s): Toilet;Tub/Shower;Other (comment) OT Plan OT Intensity: Minimum of 1-2 x/day, 45 to 90 minutes OT Frequency: 5 out of 7 days OT Duration/Estimated Length of Stay: 10-14 OT Treatment/Interventions: Balance/vestibular training;Discharge planning;Self Care/advanced ADL retraining;Therapeutic Activities;UE/LE Coordination activities;DME/adaptive equipment instruction;Community reintegration;UE/LE Strength taining/ROM;Wheelchair propulsion/positioning;Therapeutic Exercise;Patient/family education OT Self Feeding Anticipated Outcome(s): mod I OT Basic Self-Care Anticipated Outcome(s): Mod I OT Toileting Anticipated Outcome(s): mod I  OT Bathroom Transfers Anticipated Outcome(s): mod I  OT Recommendation Patient destination: Home Follow Up Recommendations: Home health OT Equipment Recommended: 3 in 1 bedside comode Equipment Details: w/c , shower bench, BSC (verify with pt)    Skilled Therapeutic Intervention Chart  review, functional assessment completed by therapist. Development of POC and explanation of role of OT shared with pt. Pt agreeable to AM ADLs including sponge bath as pt has orders for no shower. Pt completed UB d/b with min A and v/c for sequencing while seated in w/c. Pt agreed to complete LB d/b in bed for safety reasons. Pt transferred from w/c<>bed with slide board in min A with v/c for technique and hand/foot placement. Pt moved from EOB to supine lying with min A and repositioned self in bed using bed rails. Pt completed LB d/b while in bed with min A for looping pants around legs. Pt instructed on rolling technique to pull pants up while in bed. Pt moved from supine lying to EOB with HOB elevated with min A. Pt transferred back to w/c with slide board and completed grooming at sink at wc level. Therapist instructed pt on wheelchair mobility in room to increase environment. Therapist discussed BSC with pt. Pt seemed confused about BSC transfer and reasoning stating "i'll just go in the urinal." Therapist explained BSC and toileting process. Continue to assess and work with pt on this technique. Pt left resting in w/c with lunch.   OT Evaluation Precautions/Restrictions  Precautions Precautions: Fall Restrictions Weight Bearing Restrictions: Yes LLE Weight Bearing: Non weight bearing Other Position/Activity Restrictions: has R prosthtic leg for R BKA (OLD); L BKA (NEW) General Chart Reviewed: Yes Additional Pertinent History: 57 year old right-handed male with history of diabetes mellitus, chronic pain syndrome, hypertension, right BKA several years ago. History taken from chart review and patient. Patient lives alone. Independent with a cane prior to admission as well as right prosthesis. Presented 07/21/2017 with ulceration to left foot and no change with conservative care. X-rays of left foot  consistent with cellulitis and osteomyelitis. Underwent left BKA 07/21/2017 per Dr. Doran Durand.  Response  to Previous Treatment: Patient with no complaints from previous session Family/Caregiver Present: No Vital Signs Therapy Vitals Temp: 97.8 F (36.6 C) Temp Source: Oral Pulse Rate: (!) 106 Resp: 16 BP: (!) 150/72 Patient Position (if appropriate): Sitting Oxygen Therapy SpO2: 100 % O2 Device: Not Delivered   Home Living/Prior Functioning Home Living Family/patient expects to be discharged to:: Private residence Living Arrangements: Alone Available Help at Discharge: Available PRN/intermittently Type of Home: House Home Access: Level entry Home Layout: One level Bathroom Shower/Tub: Chiropodist: Standard Additional Comments: verify bathroomset up with pt   Lives With: Family, Friend(s) IADL History Homemaking Responsibilities: Yes Meal Prep Responsibility: Therapist, occupational Responsibility: Primary Homemaking Comments: pt reports brother lives [place] and helps with IADLs Current License: Yes Mode of Transportation: Friends, Family Prior Function Level of Independence: Requires assistive device for independence, Independent with basic ADLs, Independent with homemaking with ambulation, Independent with transfers  Able to Take Stairs?: Yes Driving: Yes Vocation: Retired Biomedical scientist: retired  Leisure: Hobbies-yes (Comment) Comments: likes to watch movies ADL ADL Eating: Modified independent Where Assessed-Eating: Wheelchair Grooming: Contact guard Where Assessed-Grooming: Wheelchair Upper Body Bathing: Minimal cueing, Contact guard Where Assessed-Upper Body Bathing: Wheelchair Lower Body Bathing: Minimal cueing, Contact guard Where Assessed-Lower Body Bathing: Bed level Upper Body Dressing: Minimal assistance Where Assessed-Upper Body Dressing: Wheelchair Lower Body Dressing: Minimal assistance Where Assessed-Lower Body Dressing: Bed level Toileting: Moderate assistance Where Assessed-Toileting: Bedside Commode Toilet Transfer: Moderate  assistance Toilet Transfer Method: Theatre manager: Drop arm bedside commode Tub/Shower Transfer: Not assessed Social research officer, government: Not assessed ADL Comments: cues for sequencing and directions Vision Baseline Vision/History: No visual deficits Patient Visual Report: No change from baseline Vision Assessment?: No apparent visual deficits Perception  Perception: Within Functional Limits Praxis Praxis: Impaired Praxis Impairment Details: Initiation;Ideation;Motor planning Praxis-Other Comments: requires verbal cues for initiation and motor planning during ADL session; difficulty with processing and completing directions  Cognition Overall Cognitive Status: No family/caregiver present to determine baseline cognitive functioning Arousal/Alertness: Awake/alert Orientation Level: Person;Place;Situation Person: Oriented Place: Oriented Situation: Oriented Year: 2019 Month: January Day of Week: Incorrect Memory: Impaired Memory Impairment: Decreased recall of new information Attention: Focused Focused Attention: Appears intact Awareness: Impaired Awareness Impairment: Anticipatory impairment Problem Solving: Impaired Problem Solving Impairment: Verbal complex Executive Function: Sequencing;Initiating;Self Correcting Sequencing: Impaired Sequencing Impairment: Functional basic Initiating: Impaired Initiating Impairment: Verbal basic;Functional basic Self Correcting: Impaired Self Correcting Impairment: Verbal basic Safety/Judgment: Appears intact Sensation Sensation Light Touch: Appears Intact Proprioception: Appears Intact Coordination Gross Motor Movements are Fluid and Coordinated: No Fine Motor Movements are Fluid and Coordinated: No Coordination and Movement Description: Gross motor movements impaired 2/2 bilateral BKA; MFC noted to have slight deficits with precision but [facility] Motor  Motor Motor: Other (comment);Within Functional  Limits Motor - Skilled Clinical Observations: New L BKA, pre-existing R BKA; generalized weakness throughout; UB WFL, noted "nerve" damange per pt report  Mobility  Bed Mobility Supine to Sit: 4: Min assist Supine to Sit Details: Verbal cues for technique;Manual facilitation for weight shifting Supine to Sit Details (indicate cue type and reason): bedrails and HOB elevated Sit to Supine: 5: Supervision Sit to Supine - Details: Verbal cues for precautions/safety;Verbal cues for technique  Trunk/Postural Assessment  Cervical Assessment Cervical Assessment: Exceptions to WFL(forward head posture) Thoracic Assessment Thoracic Assessment: Within Functional Limits Lumbar Assessment Lumbar Assessment: Exceptions to WFL(posterior pelvic tilt) Postural Control Postural Control: Deficits  on evaluation Righting Reactions: Delayed  Protective Responses: Delayed  Balance Static Sitting Balance Static Sitting - Balance Support: Bilateral upper extremity supported Static Sitting - Level of Assistance: 5: Stand by assistance Dynamic Sitting Balance Dynamic Sitting - Balance Support: Left upper extremity supported Dynamic Sitting - Level of Assistance: 5: Stand by assistance Dynamic Sitting Balance - Compensations: during ADLs noted to have weakness in trunk Dynamic Sitting - Balance Activities: Reaching across midline Extremity/Trunk Assessment RUE Assessment RUE Assessment: Within Functional Limits LUE Assessment LUE Assessment: Within Functional Limits   See Function Navigator for Current Functional Status.   Refer to Care Plan for Long Term Goals  Recommendations for other services: None    Discharge Criteria: Patient will be discharged from OT if patient refuses treatment 3 consecutive times without medical reason, if treatment goals not met, if there is a change in medical status, if patient makes no progress towards goals or if patient is discharged from hospital.  The above  assessment, treatment plan, treatment alternatives and goals were discussed and mutually agreed upon: by patient  Delon [place] 07/26/2017, 4:19 PM

## 2017-07-26 NOTE — Progress Notes (Signed)
Physical Therapy Assessment and Plan  Patient Details  Name: Justin Stone MRN: [id number] Date of Birth: [birth year]  PT Diagnosis: Difficulty walking, Muscle weakness and Pain in joint Rehab Potential: Good ELOS: 10-14 days    Today's Date: 07/26/2017 PT Individual Time: 0800-0900 AND 1505-1600 PT Individual Time Calculation (min): 60 min  AND 55 min Today's Date: 07/26/2017 PT Missed Time: 15 Minutes Missed Time Reason: Patient fatigue   Problem List:  Patient Active Problem List   Diagnosis Date Noted  . Post-operative pain   . Type 2 diabetes mellitus with peripheral neuropathy (HCC)   . Amputation of left lower extremity below knee ([place]) 07/25/2017  . S/P bilateral BKA (below knee amputation) ([place])   . Unilateral complete BKA, left, sequela ([place])   . Chronic pain syndrome   . Benign essential HTN   . Leukocytosis   . Acute blood loss anemia   . Tachycardia   . Hyponatremia   . AKI (acute kidney injury) ([place]) 07/22/2017  . Osteomyelitis of left foot ([place])   . Diabetic wet gangrene of the foot ([place]) 07/21/2017  . Gangrene of left foot ([place]) 07/21/2017  . Unilateral complete BKA, left, initial encounter ([place]) 07/21/2017  . Insomnia 05/13/2016  . Umbilical hernia without obstruction and without gangrene 05/13/2016  . Spondylosis of lumbar region without myelopathy or radiculopathy 07/22/2015  . Obesity 06/29/2015  . 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 Type II diabetes mellitus, well controlled ([place]) 03/26/2015  . S/P BKA (below knee amputation), right ([place]) 03/26/2015  . Alcoholic cirrhosis of liver without ascites ([place]) 02/22/2015  . Chronic hepatitis C without hepatic coma ([place]) 02/22/2015  . Chronic low back pain 12/29/2014  . Essential hypertension, benign 12/29/2014  . Hyperlipidemia LDL goal <100 12/29/2014    Past Medical History:  Past Medical History:  Diagnosis Date  . Allergy    eye allergies  . Anxiety   . Arthritis    . Diabetes mellitus without complication ([place])    takes oral meds now only  . GERD (gastroesophageal reflux disease)    past hx > 2 yrs ago  . Hepatitis    "Hepatitis C"- tx. with [facility]- now testing negative.  . Hyperlipidemia   . Hypertension   . Neuromuscular disorder (HCC)    neuropathy hands/ feet.  . Osteopenia   . Prosthesis adjustment    right leg- below knee(weighs 10 lbs.)   Past Surgical History:  Past Surgical History:  Procedure Laterality Date  . AMPUTATION Left 07/21/2017   Procedure: LEFT  BELOW KNEE AMPUTATION;  Surgeon: Wylene Simmer, MD;  Location: [facility];  Service: Orthopedics;  Laterality: Left;  . HERNIA REPAIR Left    LIH  . INSERTION OF MESH N/A 06/15/2016   Procedure: INSERTION OF MESH;  Surgeon: Greer Pickerel, MD;  Location: [facility];  Service: General;  Laterality: N/A;  . right leg removal     for diabetes, wears prosthesis -Right Below knee since '15  . UMBILICAL HERNIA REPAIR N/A 06/15/2016   Procedure: LAPAROSCOPIC ASSISTED REPAIR OF  UMBILICAL HERNIA;  Surgeon: Greer Pickerel, MD;  Location: [facility];  Service: General;  Laterality: N/A;    Assessment & Plan Clinical Impression: Patient is a 57 year old right-handed male with history of diabetes mellitus, chronic pain syndrome, hypertension, right BKA several years ago. History taken from chart review and patient. Patient lives alone. Independent with a cane prior to admission as well as right prosthesis. Presented 07/21/2017 with ulceration to  left foot and no change with conservative care. X-rays of left foot consistent with cellulitis and osteomyelitis. Underwent left BKA 07/21/2017 per Dr. Doran Durand. Hospital course pain management. Intermittent bouts of confusion with narcotics adjusted. Acute blood loss anemia 10.2 and monitored. Leukocytosis 16,000. Subcutaneous Lovenox for DVT prophylaxis. Patient transferred to CIR on 07/25/2017 .   Patient currently requires min with mobility secondary to muscle  weakness, decreased cardiorespiratoy endurance, impaired timing and sequencing and decreased motor planning, decreased initiation, decreased problem solving and delayed processing and decreased sitting balance, decreased postural control, decreased balance strategies and difficulty maintaining precautions.  Prior to hospitalization, patient was modified independent  with mobility and lived with Family, Friend(s) in a House home.  Home access is  Level entry, ramp being built.   Patient will benefit from skilled PT intervention to maximize safe functional mobility, minimize fall risk and decrease caregiver burden for planned discharge home with intermittent assist.  Anticipate patient will benefit from follow up [facility] at discharge.  PT - End of Session Activity Tolerance: Decreased this session;Tolerates < 10 min activity, no significant change in vital signs Endurance Deficit: Yes Endurance Deficit Description: Decreased, requires rest breaks every 30-60 sec during mobility  PT Assessment Rehab Potential (ACUTE/IP ONLY): Good PT Barriers to Discharge: Decreased caregiver support;Lack of/limited family support;Home environment access/layout PT Barriers to Discharge Comments: 1 STE to enter home; per chart review and conversation w/ pt, a ramp is being built  PT Patient demonstrates impairments in the following area(s): Balance;Endurance;Motor;Pain;Safety;Skin Integrity PT Transfers Functional Problem(s): Bed Mobility;Bed to Chair;Car;Furniture;Floor PT Locomotion Functional Problem(s): Ambulation;Wheelchair Mobility;Stairs PT Plan PT Intensity: Minimum of 1-2 x/day ,45 to 90 minutes PT Frequency: 5 out of 7 days PT Duration Estimated Length of Stay: 10-14 days  PT Treatment/Interventions: Ambulation/gait training;Disease management/prevention;Pain management;Stair training;Visual/perceptual remediation/compensation;Wheelchair propulsion/positioning;Therapeutic Activities;Patient/family  education;DME/adaptive equipment instruction;Balance/vestibular training;Cognitive remediation/compensation;Psychosocial support;Therapeutic Exercise;UE/LE Strength taining/ROM;Skin care/wound management;Functional mobility training;Community reintegration;Discharge planning;Neuromuscular re-education;Splinting/orthotics;UE/LE Coordination activities PT Transfers Anticipated Outcome(s): Mod I  PT Locomotion Anticipated Outcome(s): Mod I  PT Recommendation Recommendations for Other Services: Neuropsych consult Follow Up Recommendations: Home health PT Patient destination: Home Equipment Recommended: To be determined  Skilled Therapeutic Intervention  Session 1:  Pt supine upon arrival and agreeable to therapy, c/o 7/10 pain as listed below. Performed bed mobility, transfers, and w/c mobility as outlined below in evaluation. Pt declining use of RLE prothesis as he did not have a liner to wear, pt will see if he can get liner brought to him from home. Pt w/ increased skin breakdown at anterior portion of R residual limb and reports prosthesis has been ill-fitting recently. MD and RN made aware, Gerald Stabs @ [facility] also made aware. Pt required increased time for OOB mobility, frequent cues for attending to task and initiation, and frequent rest breaks 2/2 fatigue. Pt A&Ox4, however delayed processing and difficulty w/ sequencing/initiating observed throughout session. Instructed pt on self-propelling w/c technique and pt self-propelled 40' x4 w/ rest breaks in between bouts 2/2 fatigue. Pt repeatedly stated, "I just can't do it" and "I don't have the strength". Pt refusing to participate further in session. Total assist w/c mobility back to room. Replaced leg rests w/ amputee pads and ended session in w/c, call bell within reach and all needs met.   Session 2:  Pt in w/c upon arrival and agreeable to therapy, no c/o pain. Pt self-propelled w/c to/from ortho gym w/ supervision using BUEs. Pt required rest  breaks every 40-50' 2/2 fatigue, however decreased amount of time for  rest compared to morning session. Performed slide board transfer to/from car w/ min assist overall for cues for technique and safety. Returned to room and discussed POC, rehab potential, rehab goals, and discharge recommendations. Pt asking why he can't get a prosthesis for new L BKA now vs after healing. Educated pt on importance of residual limb healing prior to prosthetic training, discussed likely discharge at w/c level at this time 2/2 safety as pt lives alone, he verbalized understanding of this. Transferred w/c to bed via slide board min assist and ended session in supine, call bell within reach and all needs met.   PT Evaluation Precautions/Restrictions Precautions Precautions: Fall Restrictions Weight Bearing Restrictions: Yes LLE Weight Bearing: Non weight bearing Pain Pain Assessment Pain Assessment: 0-10 Pain Score: 3  Pain Type: Neuropathic pain Pain Location: Leg Pain Orientation: Left Pain Descriptors / Indicators: Pins and needles;Burning Pain Onset: On-going Patients Stated Pain Goal: 3 Pain Intervention(s): Distraction Home Living/Prior Functioning Home Living Available Help at Discharge: Available PRN/intermittently Type of Home: House Home Access: Level entry Home Layout: One level  Lives With: Family;Friend(s) Prior Function Level of Independence: Requires assistive device for independence;Independent with basic ADLs;Independent with homemaking with ambulation;Independent with transfers(per chart review, a limited community ambulator, uses single point cane for mobility)  Able to Take Stairs?: Yes Driving: Yes Vocation: Retired Biomedical scientist: retired  Leisure: Hobbies-yes (Comment) Comments: likes to watch movies Vision/Perception  Perception Perception: Within Functional Limits Praxis Praxis: Impaired Praxis Impairment Details: Initiation;Ideation;Motor planning Praxis-Other  Comments: Required verbal cues for initiation and motor planning during mobility tasks performed. Expressed increased difficulty w/ understanding what therapist asked him to perform (tasks such as sitting up to side of bed).   Cognition Overall Cognitive Status: No family/caregiver present to determine baseline cognitive functioning Arousal/Alertness: Awake/alert Orientation Level: Oriented X4 Attention: Focused Focused Attention: Appears intact Memory: Impaired Memory Impairment: Decreased recall of new information Problem Solving: Impaired Executive Function: Sequencing;Initiating Sequencing: Impaired Initiating: Impaired Safety/Judgment: Appears intact Sensation Sensation Light Touch: Appears Intact(Per pt report, no sensation deficits in either extremity) Proprioception: Appears Intact Coordination Gross Motor Movements are Fluid and Coordinated: No Fine Motor Movements are Fluid and Coordinated: Yes Coordination and Movement Description: Gross motor movements impaired 2/2 bilateral BKA Motor  Motor Motor: Other (comment) Motor - Skilled Clinical Observations: New L BKA, pre-existing R BKA; generalized weakness throughout  Mobility Bed Mobility Bed Mobility: Rolling Right;Rolling Left;Supine to Sit;Sit to Supine Rolling Right: 5: Supervision Rolling Right Details: Verbal cues for technique;Tactile cues for initiation Rolling Left: 5: Supervision Rolling Left Details: Verbal cues for technique;Tactile cues for initiation Supine to Sit: 4: Min assist Supine to Sit Details: Verbal cues for technique;Manual facilitation for weight shifting Supine to Sit Details (indicate cue type and reason): Heavy use of bed rails, HOB elevated  Sit to Supine: 5: Supervision Transfers Transfers: Yes Lateral/Scoot Transfers: 4: Min assist Lateral/Scoot Transfer Details: Tactile cues for initiation;Tactile cues for placement;Verbal cues for sequencing;Verbal cues for technique;Verbal cues for  precautions/safety Lateral/Scoot Transfer Details (indicate cue type and reason): Cues for technique and many cues for sequencing  Locomotion  Ambulation Ambulation: No Gait Gait: No Stairs / Additional Locomotion Stairs: No Wheelchair Mobility Wheelchair Mobility: Yes Wheelchair Assistance: 5: Investment banker, operational Details: Verbal cues for Marketing executive: Both upper extremities Wheelchair Parts Management: Needs assistance Distance: 40'  Trunk/Postural Assessment  Cervical Assessment Cervical Assessment: Exceptions to WFL(forward head posture) Thoracic Assessment Thoracic Assessment: Within Functional Limits Lumbar Assessment Lumbar Assessment: Exceptions to WFL(posterior pelvic  tilt ) Postural Control Postural Control: Deficits on evaluation Righting Reactions: Delayed  Protective Responses: Delayed  Balance Balance Balance Assessed: Yes Static Sitting Balance Static Sitting - Balance Support: Bilateral upper extremity supported Static Sitting - Level of Assistance: 5: Stand by assistance Dynamic Sitting Balance Dynamic Sitting - Balance Support: Left upper extremity supported Dynamic Sitting - Level of Assistance: 5: Stand by assistance Extremity Assessment  RLE Assessment RLE Assessment: Exceptions to WFL(pre-existing ampuation, R BKA; ROM and strength WFL otherwise) LLE Assessment LLE Assessment: Exceptions to WFL(Difficult to assess 2/2 knee extension amputee brace, hip musculature WFL)   See Function Navigator for Current Functional Status.   Refer to Care Plan for Long Term Goals  Recommendations for other services: Neuropsych  Discharge Criteria: Patient will be discharged from PT if patient refuses treatment 3 consecutive times without medical reason, if treatment goals not met, if there is a change in medical status, if patient makes no progress towards goals or if patient is discharged from hospital.  The above assessment,  treatment plan, treatment alternatives and goals were discussed and mutually agreed upon: by patient  Johanna Stafford K Arnette 07/26/2017, 9:12 AM

## 2017-07-27 ENCOUNTER — Inpatient Hospital Stay (HOSPITAL_COMMUNITY): Payer: Medicare Other | Admitting: Occupational Therapy

## 2017-07-27 ENCOUNTER — Inpatient Hospital Stay (HOSPITAL_COMMUNITY): Payer: Medicare Other

## 2017-07-27 LAB — GLUCOSE, CAPILLARY
GLUCOSE-CAPILLARY: 169 mg/dL — AB (ref 65–99)
Glucose-Capillary: 141 mg/dL — ABNORMAL HIGH (ref 65–99)
Glucose-Capillary: 137 mg/dL — ABNORMAL HIGH (ref 65–99)
Glucose-Capillary: 127 mg/dL — ABNORMAL HIGH (ref 65–99)

## 2017-07-27 MED ORDER — METFORMIN HCL 500 MG PO TABS
500.0000 mg | ORAL_TABLET | Freq: Every day | ORAL | Status: DC
  Administered 2017-07-27 – 2017-07-31 (×5): 500 mg via ORAL
  Filled 2017-07-27 (×5): qty 1

## 2017-07-27 NOTE — Progress Notes (Signed)
Physical Therapy Session Note  Patient Details  Name: Justin Stone MRN: [id number] Date of Birth: 09-Aug-1960  Today's Date: 07/27/2017 PT Individual Time: [id number], [id number] PT Individual Time Calculation (min): 44 min , 84 min   Short Term Goals: Week 1:  PT Short Term Goal 1 (Week 1): Pt will don and doff RLE BKA prosthesis w/ set-up assist while sitting at EOB or in w/c PT Short Term Goal 2 (Week 1): Pt will self-propel w/c 150' w/ supervision PT Short Term Goal 3 (Week 1): Pt will transfer bed<>chair via slide board transfer w/ supervision  PT Short Term Goal 4 (Week 1): Pt will perform sit<>stand transfer w/ Max assist using LRAD PT Short Term Goal 5 (Week 1): Pt will maintain dynamic sitting balance during functional activity w/ supervision w/o UE support  Skilled Therapeutic Interventions/Progress Updates:    Session 1: Pt supine in bed upon PT arrival, agreeable to therapy tx and reports pain 7/10 in L distal LE. Pt transferred from supine>sitting EOB with min assist and verbal cues for techniques. Pt reported needing to use the bathroom, asking for the bedpan. Pt agreeable to try using the bedside commode. Pt transferred from bed>commode with min assist using slideboard, verbal cues for techniques and sequencing. Pt performed lateral leans once on the commode for lower body clothing management and for therapist to clean peri area. Pt transferred from commode>w/c with min assist using slideboard, verbal cues for techniques and safety. Pt left seated in w/c at end of session with needs in reach.   Session 2: Pt supine in bed upon PT arrival, disoriented to time this session thinking it was nighttime. Pt does not have a clock in his room, therapist opened his blinds to help re-orient patient to time of day. Pt transferred from supine>sitting EOB with min assist and verbal cues for techniques. Pt completed A-P transfer from bed>w/c with verbal cues for sequencing and techniques, increased  time to complete secondary to fatigue and limited endurance. Pt propelled w/c from room>gym x150 ft total but taking rest breaks every 40 ft secondary to fatigue and poor endurance. Pt transferred from w/c<>mat using slideboard, min assist and increased time to complete, therapist focused on education for transfer set up and w/c parts management. Pt transferred sitting>supine with supervision. In supine pt performed LE therex for strengthening 2 x 10 bilaterally: SLR, SAQ, hip abduction, sidelying hip abduction, sidelying hip extension, and sidelying manual hip flexor stretch. Pt transferred supine>sitting edge of mat with mod assist and verbal cues for techniques. Seated edge of mat pt performed 2 x 10 LAQ for strengthening. Pt worked on dynamic sitting balance without back support in order to complete peg board puzzle, increased time to complete and min verbal cues for errors. Pt used UE ergometer for endurance and strengthening however after 3 minutes pt reports feeling too tired and light headed. Vitals monitored, BP 141/72 and HR 107. Pt propelled w/c back to room and left seated in w/c with needs in reach. Pt with occasional confusion throughout session and difficulty following commands.  Pt making comments throughout session such that he was not aware that he was in the hospital. On his way back to the room he stated "we're not in the building anymore are we?"     Therapy Documentation Precautions:  Precautions Precautions: Fall Restrictions Weight Bearing Restrictions: Yes LLE Weight Bearing: Non weight bearing Other Position/Activity Restrictions: has R prosthtic leg for R BKA (OLD); L BKA (NEW)   See Function  Navigator for Current Functional Status.   Therapy/Group: Individual Therapy  Cresenciano Genre, PT, DPT 07/27/2017, 7:57 AM

## 2017-07-27 NOTE — Plan of Care (Signed)
  Not Progressing RH BOWEL ELIMINATION RH STG MANAGE BOWEL WITH ASSISTANCE Description STG Manage Bowel with min Assistance.  07/27/2017 1618 - Not Progressing by Alfonso RamusEvans, Jamarien Rodkey S, RN Note Constipation not resolved   RH STG MANAGE BOWEL W/MEDICATION W/ASSISTANCE Description STG Manage Bowel with Medication with min Assistance.  07/27/2017 1618 - Not Progressing by Alfonso RamusEvans, Vishal Sandlin S, RN

## 2017-07-27 NOTE — Progress Notes (Signed)
[facility] PHYSICAL MEDICINE & REHABILITATION     PROGRESS NOTE  Subjective/Complaints:  Pt seen laying in bed this AM.  He slept well overnight and had a good first day of therapies.  He would like to know when he can go to the restroom independently. He has questions about his discharge date.  ROS: Denies CP, SOB, N/V/D.  Objective: Vital Signs: Blood pressure (!) 143/78, pulse (!) 103, temperature 97.8 F (36.6 C), temperature source Oral, resp. rate 18, height 5\' 7"  (1.702 m), weight 113.9 kg (251 lb 1.7 oz), SpO2 99 %. No results found. Recent Labs    07/25/17 1607 07/26/17 0614  WBC 13.3* 13.3*  HGB 11.7* 11.7*  HCT 35.2* 35.6*  PLT 341 360   Recent Labs    07/25/17 1607 07/26/17 0614  NA  --  128*  K  --  4.2  CL  --  98*  GLUCOSE  --  146*  BUN  --  18  CREATININE 1.14 1.07  CALCIUM  --  8.9   CBG (last 3)  Recent Labs    07/26/17 1644 07/26/17 2057 07/27/17 0634  GLUCAP 141* 158* 169*    Wt Readings from Last 3 Encounters:  07/25/17 113.9 kg (251 lb 1.7 oz)  07/21/17 113.4 kg (250 lb)  07/21/17 113.8 kg (250 lb 12.8 oz)    Physical Exam:  BP (!) 143/78 (BP Location: Left Arm)   Pulse (!) 103   Temp 97.8 F (36.6 C) (Oral)   Resp 18   Ht 5\' 7"  (1.702 m)   Wt 113.9 kg (251 lb 1.7 oz)   SpO2 99%   BMI 39.33 kg/m  General: NAD. Vitals stable. HENT: Normocephalic. Atraumatic Eyes: EOMI. No discharge Cardiac. +Tachycardia. Regular rhythm.  No JVD. Respiratory. Clear to auscultation. Unlabored.  Abdomen: good bowel sounds. nondistended Neurological. Alert and oriented.  Followed full commands. Motor: B/l UE 5/5 proximal to distal LLE: HF 4/5 (stable) RLE: HF, KE 4+/5 Skin. Left BKA with dressing c/d/i. Right BKA with small ulcer area at base of stump.   Assessment/Plan: 1. Functional deficits secondary to bilateral BKA which require 3+ hours per day of interdisciplinary therapy in a comprehensive inpatient rehab setting. Physiatrist is  providing close team supervision and 24 hour management of active medical problems listed below. Physiatrist and rehab team continue to assess barriers to discharge/monitor patient progress toward functional and medical goals.  Function:  Bathing Bathing position   Position: Wheelchair/chair at sink  Bathing parts Body parts bathed by patient: Right arm, Left arm, Chest, Abdomen, Front perineal area, Buttocks, Left upper leg, Right upper leg Body parts bathed by helper: Back  Bathing assist Assist Level: Touching or steadying assistance(Pt > 75%)      Upper Body Dressing/Undressing Upper body dressing   What is the patient wearing?: Hospital gown                Upper body assist Assist Level: Touching or steadying assistance(Pt > 75%)      Lower Body Dressing/Undressing Lower body dressing   What is the patient wearing?: Pants     Pants- Performed by patient: Thread/unthread right pants leg, Thread/unthread left pants leg, Pull pants up/down(rolling in bed)                        Lower body assist Assist for lower body dressing: Touching or steadying assistance (Pt > 75%)      Toileting Toileting  Toileting assist     Transfers Chair/bed transfer   Chair/bed transfer method: Lateral scoot Chair/bed transfer assist level: Touching or steadying assistance (Pt > 75%) Chair/bed transfer assistive device: Sliding board     Locomotion Ambulation Ambulation activity did not occur: Safety/medical concerns         Wheelchair   Type: Manual Max wheelchair distance: 1040' Assist Level: Supervision or verbal cues  Cognition Comprehension Comprehension assist level: Understands basic 90% of the time/cues < 10% of the time  Expression Expression assist level: Expresses basic 90% of the time/requires cueing < 10% of the time.  Social Interaction Social Interaction assist level: Interacts appropriately 90% of the time - Needs monitoring or encouragement  for participation or interaction.  Problem Solving Problem solving assist level: Solves basic 90% of the time/requires cueing < 10% of the time  Memory Memory assist level: Recognizes or recalls 90% of the time/requires cueing < 10% of the time    Medical Problem List and Plan: 1.  Decreased functional mobility secondary to left BKA 07/21/2017 as well as history of right BKA. Patient does use a right prosthesis   Cont CIR 2.  DVT Prophylaxis/Anticoagulation: Subcutaneous Lovenox. Monitor for any bleeding episodes 3. Pain Management/chronic pain: Soma 350 mg 3 times a day, Neurontin 1200 mg 3 times a day, MS Contin 15 mg every 12 hours, Robaxin and oxycodone as needed 4. Mood: Effexor 75 mg twice a day Trazodone 25 mg daily at bedtime as needed 5. Neuropsych: This patient is capable of making decisions on his own behalf. 6. Skin/Wound Care: Routine skin checks 7. Fluids/Electrolytes/Nutrition: Routine I&O's  8. Acute blood loss anemia. Continue iron supplement.    Hb 11.7 on 1/1   Labs ordered for tomorrow   Cont to monitor 9. Diabetes mellitus and peripheral neuropathy. Hemoglobin A1c 6.5. Currently with SSI. Check blood sugars before meals and at bedtime. Diabetic teaching. Patient on Glucophage 500 mg twice a day prior to admission. Resume as needed   Metformin 500 daily started on 1/2   Monitor with increased mobility 10. Hypertension. Norvasc 10 mg daily.   Improving on 1/2 11. Hyperlipidemia. Zoc2or 12. Constipation. Laxative assistance 13. Hyponatremia   Na 128 on 1/1   Labs ordered for tomorrow   Cont to monitor 14. Leukocytosis   Afebrile   WBCs 13.3 on 1/1   Labs ordered for tomorrow   Cont to monitor  LOS (Days) 2 A FACE TO FACE EVALUATION WAS PERFORMED  Vickki Igou Karis Jubanil Doctor Sheahan 07/27/2017 8:14 AM

## 2017-07-27 NOTE — Progress Notes (Signed)
Occupational Therapy Session Note  Patient Details  Name: Justin Stone MRN: [id number] Date of Birth: January 08, 1961  Today's Date: 07/27/2017 OT Individual Time: 1101-1201 OT Individual Time Calculation (min): 60 min    Short Term Goals: Week 1:  OT Short Term Goal 1 (Week 1): Pt will complete BSC transfer with S OT Short Term Goal 2 (Week 1): Pt will complete LB washing with S OT Short Term Goal 3 (Week 1): Pt will complete grooming at sink with S OT Short Term Goal 4 (Week 1): Pt will use w/c to gather clothes in room for dressing task with S   Skilled Therapeutic Interventions/Progress Updates:    Pt completed bathing and dressing during session.  Since he was already up in the wheelchair, went ahead and had him complete UB bathing and grooming at the sink.  He needed mod instructional cueing throughout session to understand simple instructions such as "let's go over to the sink and wash your upper body and then we will get over to the edge of the bed to sit and work on the LB bathing.  Pt would reply that with "Now what do you want me to do?".  When asked about his home situation, he reported that he lived alone.  He stated if help was needed his brother could come and help.  Unsure how reliable this is at this time.  He completed all UB bathing with supervision sitting in the wheelchair.  Min assist for sliding board transfer to the EOB to work on LB bathing.  He completed lateral leans side to side with increased time and mod demonstrational cueing for doffing and donning shorts, as well as for washing peri area.  Once completed pt transferred to supine to conclude therapy session.  Therapist applied LLE leg splint to help encourage knee extension.  Call button and phone in reach.    Therapy Documentation Precautions:  Precautions Precautions: Fall Restrictions Weight Bearing Restrictions: No LLE Weight Bearing: Non weight bearing Other Position/Activity Restrictions: has R prosthtic  leg for R BKA (OLD); L BKA (NEW)  Pain: Pain Assessment Pain Assessment: 0-10 Pain Score: 9  Pain Type: Acute pain Pain Location: Leg Pain Orientation: Left Pain Descriptors / Indicators: Discomfort Pain Intervention(s): Repositioned;Emotional support ADL: ADL Eating: Modified independent Where Assessed-Eating: Wheelchair Grooming: Contact guard Where Assessed-Grooming: Wheelchair Upper Body Bathing: Minimal cueing, Contact guard Where Assessed-Upper Body Bathing: Wheelchair Lower Body Bathing: Minimal cueing, Contact guard Where Assessed-Lower Body Bathing: Bed level Upper Body Dressing: Minimal assistance Where Assessed-Upper Body Dressing: Wheelchair Lower Body Dressing: Minimal assistance Where Assessed-Lower Body Dressing: Bed level Toileting: Moderate assistance Where Assessed-Toileting: Bedside Commode Toilet Transfer: Moderate assistance Toilet Transfer Method: Scientist, research (life sciences)Transfer board Toilet Transfer Equipment: Drop arm bedside commode Tub/Shower Transfer: Not assessed Film/video editorWalk-In Shower Transfer: Not assessed ADL Comments: cues for sequencing and directions  See Function Navigator for Current Functional Status.   Therapy/Group: Individual Therapy  Jaedin Regina OTR/L 07/27/2017, 1:32 PM

## 2017-07-27 NOTE — Patient Care Conference (Signed)
Inpatient RehabilitationTeam Conference and Plan of Care Update Date: 07/27/2017   Time: 12:05 PM    Patient Name: Justin Stone      Medical Record Number: [id number]  Date of Birth: 11/20/1960 Sex: Male         Room/Bed: 4M05C/4M05C-01 Payor Info: Payor: [employer] programmer[employer]ALTHCARE MEDICARE / Plan: UHC MEDICARE / Product Type: *No Product type* /    Admitting Diagnosis: bi lateral  bka  Admit Date/Time:  07/25/2017  3:05 PM Admission Comments: No comment available   Primary Diagnosis:  <principal problem not specified> Principal Problem: <principal problem not specified>  Patient Active Problem List   Diagnosis Date Noted  . Post-operative pain   . Type 2 diabetes mellitus with peripheral neuropathy (HCC)   . Amputation of left lower extremity below knee (HCC) 07/25/2017  . S/P bilateral BKA (below knee amputation) (HCC)   . Unilateral complete BKA, left, sequela (HCC)   . Chronic pain syndrome   . Benign essential HTN   . Leukocytosis   . Acute blood loss anemia   . Tachycardia   . Hyponatremia   . AKI (acute kidney injury) (HCC) 07/22/2017  . Osteomyelitis of left foot (HCC)   . Diabetic wet gangrene of the foot (HCC) 07/21/2017  . Gangrene of left foot (HCC) 07/21/2017  . Unilateral complete BKA, left, initial encounter (HCC) 07/21/2017  . Insomnia 05/13/2016  . Umbilical hernia without obstruction and without gangrene 05/13/2016  . Spondylosis of lumbar region without myelopathy or radiculopathy 07/22/2015  . Obesity 06/29/2015  . 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 Type II diabetes mellitus, well controlled (HCC) 03/26/2015  . S/P BKA (below knee amputation), right (HCC) 03/26/2015  . Alcoholic cirrhosis of liver without ascites (HCC) 02/22/2015  . Chronic hepatitis C without hepatic coma (HCC) 02/22/2015  . Chronic low back pain 12/29/2014  . Essential hypertension, benign 12/29/2014  . Hyperlipidemia LDL goal <100 12/29/2014    Expected  Discharge Date: Expected Discharge Date: 08/06/17  Team Members Present: Physician leading conference: Dr. Maryla MorrowAnkit Patel Social Worker Present: Dossie DerBecky Daxson Reffett, LCSW Nurse Present: Allayne Stackhelsey Evans, RN PT Present: Woodfin GanjaEmily Van Shagen, PT OT Present: Perrin MalteseJames McGuire, OT SLP Present: Jackalyn LombardNicole Page, SLP PPS Coordinator present : Tora DuckMarie Noel, RN, CRRN     Current Status/Progress Goal Weekly Team Focus  Medical   Decreased functional mobility secondary to left BKA 07/21/2017 as well as history of right BKA.   Improve mobility, safety, transfers, hyponatremia, CBGs, leukocytosis, BP  See above   Bowel/Bladder   Pt continent of b&b. LBM 07/25/17.  Min assist   Monitor for constipation. Encourage use of stool softners as needed.    Swallow/Nutrition/ Hydration             ADL's   Pt cureently supervision for UB selfcare and LB selfcare at min assist for sitting EOB with lateral leans.  Min assist for sliding board transfers.  Some confusion and decreased awareness noted.   modified independent overall secondary to being alone  selfcare retaining, balance retraining, transfer retraining, functional transfers, pt/family education   Mobility   min assist overall, have not initiated gait (waiting for [employer] to check RLE prosthesis and bring sock)   Mod I w/c level   transfers, inititate gait and standing w/ RLE prosthesis, endurance, functional UE strength    Communication             Safety/Cognition/ Behavioral Observations  Pain   Pt has occasional complaints of pain manged with scheduled MS contin and soma with prn oxy for breakthrough pain.   <4 out of 10..   Assess for pain q shift and prn. continue current pain regimen.    Skin   Pt has unstageable pressure wound to R stump. WTD dressing BID. Left stump with dry dressing intact.   No new breakdown.   Assess skin q shift and prn. continue dressing changes as ordered. Encourage pressure relief .      *See Care Plan and progress notes for  long and short-term goals.     Barriers to Discharge  Current Status/Progress Possible Resolutions Date Resolved   Physician    Decreased caregiver support;Medical stability;Lack of/limited family support;Weight bearing restrictions     See above  Therapies, follow labs, optimize DM/BP meds      Nursing  Wound Care;Lack of/limited family support;Weight bearing restrictions;Medication compliance  lives alone            PT  Decreased caregiver support;Lack of/limited family support;Home environment access/layout  1 STE to enter home; per chart review and conversation w/ pt, a ramp is being built               OT Inaccessible home environment;Decreased caregiver support  home envionrment and functional IND with ADLs/safety awareness              SLP                SW                Discharge Planning/Teaching Needs:    Home alone with brother assisting intermittently. Pt is somewhat confused and not making sense all of the time     Team Discussion:  Goals mod/i wheelchair level. Sore on leg with prothesis and not able to use this at this time. MD starting pt on diabetes home meds. Adjusting BP meds and still having pain issues with amputation. MD following labs. Concern regarding confusion and ability to reach the above goals. If has no assist may need NHP at discharge from rehab  Revisions to Treatment Plan:  DC 1/12    Continued Need for Acute Rehabilitation Level of Care: The patient requires daily medical management by a physician with specialized training in physical medicine and rehabilitation for the following conditions: Daily direction of a multidisciplinary physical rehabilitation program to ensure safe treatment while eliciting the highest outcome that is of practical value to the patient.: Yes Daily medical management of patient stability for increased activity during participation in an intensive rehabilitation regime.: Yes Daily analysis of laboratory values and/or radiology  reports with any subsequent need for medication adjustment of medical intervention for : Post surgical problems;Diabetes problems;Wound care problems;Blood pressure problems;Other  Lucy Chris 07/28/2017, 8:37 AM

## 2017-07-27 NOTE — Care Management (Signed)
Inpatient Rehabilitation Center Individual Statement of Services  Patient Name:  Justin Stone  Date:  07/27/2017  Welcome to the Inpatient Rehabilitation Center.  Our goal is to provide you with an individualized program based on your diagnosis and situation, designed to meet your specific needs.  With this comprehensive rehabilitation program, you will be expected to participate in at least 3 hours of rehabilitation therapies Monday-Friday, with modified therapy programming on the weekends.  Your rehabilitation program will include the following services:  Physical Therapy (PT), Occupational Therapy (OT), 24 hour per day rehabilitation nursing, Neuropsychology, Case Management (Social Worker), Rehabilitation Medicine, Nutrition Services and Pharmacy Services  Weekly team conferences will be held on Wednesday to discuss your progress.  Your Social Worker will talk with you frequently to get your input and to update you on team discussions.  Team conferences with you and your family in attendance may also be held.  Expected length of stay: 10-14 days Overall anticipated outcome: mod/i-supervision level  Depending on your progress and recovery, your program may change. Your Social Worker will coordinate services and will keep you informed of any changes. Your Social Worker's name and contact numbers are listed  below.  The following services may also be recommended but are not provided by the Inpatient Rehabilitation Center:   Driving Evaluations  Home Health Rehabiltiation Services  Outpatient Rehabilitation Services    Arrangements will be made to provide these services after discharge if needed.  Arrangements include referral to agencies that provide these services.  Your insurance has been verified to be:  UHC-Medicare & Medicaid Your primary doctor is:  Warner MccreedyJessica Copeland  Pertinent information will be shared with your doctor and your insurance company.  Social Worker:  Dossie DerBecky  Robi Mitter, SW [phone] or ([phone]) [phone]  Information discussed with and copy given to patient by: Lucy Chrisupree, Mousa Prout G, 07/27/2017, 3:25 PM

## 2017-07-28 ENCOUNTER — Inpatient Hospital Stay (HOSPITAL_COMMUNITY): Payer: Medicare Other | Admitting: Occupational Therapy

## 2017-07-28 ENCOUNTER — Inpatient Hospital Stay (HOSPITAL_COMMUNITY): Payer: Medicare Other | Admitting: Physical Therapy

## 2017-07-28 ENCOUNTER — Inpatient Hospital Stay (HOSPITAL_COMMUNITY): Payer: Medicare Other

## 2017-07-28 DIAGNOSIS — R7989 Other specified abnormal findings of blood chemistry: Secondary | ICD-10-CM

## 2017-07-28 LAB — CBC WITH DIFFERENTIAL/PLATELET
Basophils Absolute: 0.1 K/uL (ref 0.0–0.1)
Basophils Relative: 1 %
Eosinophils Absolute: 0.3 K/uL (ref 0.0–0.7)
Eosinophils Relative: 2 %
HCT: 33.7 % — ABNORMAL LOW (ref 39.0–52.0)
Hemoglobin: 10.8 g/dL — ABNORMAL LOW (ref 13.0–17.0)
Lymphocytes Relative: 14 %
Lymphs Abs: 2.1 K/uL (ref 0.7–4.0)
MCH: 31.2 pg (ref 26.0–34.0)
MCHC: 32 g/dL (ref 30.0–36.0)
MCV: 97.4 fL (ref 78.0–100.0)
Monocytes Absolute: 1.2 K/uL — ABNORMAL HIGH (ref 0.1–1.0)
Monocytes Relative: 8 %
Neutro Abs: 11.8 K/uL — ABNORMAL HIGH (ref 1.7–7.7)
Neutrophils Relative %: 75 %
Platelets: 343 K/uL (ref 150–400)
RBC: 3.46 MIL/uL — ABNORMAL LOW (ref 4.22–5.81)
RDW: 14.8 % (ref 11.5–15.5)
WBC: 15.5 K/uL — ABNORMAL HIGH (ref 4.0–10.5)

## 2017-07-28 LAB — HEMOGLOBINOPATHY EVALUATION
HGB A2 QUANT: 2.2 % (ref 1.8–3.2)
HGB F QUANT: 0 % (ref 0.0–2.0)
HGB S QUANTITAION: 0 %
HGB VARIANT: 53.6 % — AB
Hgb A: 44.2 % — ABNORMAL LOW (ref 96.4–98.8)
Hgb C: 0 %

## 2017-07-28 LAB — GLUCOSE, CAPILLARY
Glucose-Capillary: 123 mg/dL — ABNORMAL HIGH (ref 65–99)
Glucose-Capillary: 119 mg/dL — ABNORMAL HIGH (ref 65–99)
Glucose-Capillary: 129 mg/dL — ABNORMAL HIGH (ref 65–99)
Glucose-Capillary: 130 mg/dL — ABNORMAL HIGH (ref 65–99)

## 2017-07-28 LAB — BASIC METABOLIC PANEL
Anion gap: 12 (ref 5–15)
BUN: 29 mg/dL — ABNORMAL HIGH (ref 6–20)
CO2: 18 mmol/L — ABNORMAL LOW (ref 22–32)
Calcium: 8.9 mg/dL (ref 8.9–10.3)
Chloride: 97 mmol/L — ABNORMAL LOW (ref 101–111)
Creatinine, Ser: 1.31 mg/dL — ABNORMAL HIGH (ref 0.61–1.24)
GFR calc Af Amer: >60 mL/min (ref >60)
GFR, EST NON AFRICAN AMERICAN: 60 mL/min — AB (ref >60)
GLUCOSE: 133 mg/dL — AB (ref 65–99)
Potassium: 3.4 mmol/L — ABNORMAL LOW (ref 3.5–5.1)
Sodium: 127 mmol/L — ABNORMAL LOW (ref 135–145)

## 2017-07-28 NOTE — Progress Notes (Signed)
Physical Therapy Make-up Session Note  Patient Details  Name: Justin Stone MRN: [id number] Date of Birth: 1960-08-20  Today's Date: 07/28/2017 PT Individual Time: [id number] PT Individual Time Calculation (min): 29 min   Short Term Goals: Week 1:  PT Short Term Goal 1 (Week 1): Pt will don and doff RLE BKA prosthesis w/ set-up assist while sitting at EOB or in w/c PT Short Term Goal 2 (Week 1): Pt will self-propel w/c 150' w/ supervision PT Short Term Goal 3 (Week 1): Pt will transfer bed<>chair via slide board transfer w/ supervision  PT Short Term Goal 4 (Week 1): Pt will perform sit<>stand transfer w/ Max assist using LRAD PT Short Term Goal 5 (Week 1): Pt will maintain dynamic sitting balance during functional activity w/ supervision w/o UE support  Skilled Therapeutic Interventions/Progress Updates:    Session focused on reorientation and overall cognition during functional w/c mobility re-training. Pt disoriented to time, place, and situation and little carryover noted once given correct information during session. Extra time for all processing needed as well. Focused on w/c mobility through obstacle course to simulate home environment obstacle negotiation and practicing picking up and placing items from w/c for functional reaching and safety with w/c mobility. End of session performed slideboard transfer back to bed with max verbal cues for set-up of w/c and min assist for positioning of slideboard and min assist for transfer with cues for safety and efficiency. Handoff to NT   Therapy Documentation Precautions:  Precautions Precautions: Fall Restrictions Weight Bearing Restrictions: Yes LLE Weight Bearing: Non weight bearing Other Position/Activity Restrictions: has R prosthtic leg for R BKA (OLD); L BKA (NEW) Pain: Reports chronic pain all over - denies need for intervention.   See Function Navigator for Current Functional Status.   Therapy/Group: Individual  Therapy  [name], Tahmid Stonehocker Darrol PokeBrescia  Nyela Cortinas B. Rashaad Hallstrom, PT, DPT  07/28/2017, 11:59 AM

## 2017-07-28 NOTE — Progress Notes (Signed)
Occupational Therapy Session Note  Patient Details  Name: Justin Stone MRN: [id number] Date of Birth: 15-Apr-1961  Today's Date: 07/28/2017 OT Individual Time: [id number] OT Individual Time Calculation (min): 27 min    Short Term Goals: Week 1:  OT Short Term Goal 1 (Week 1): Pt will complete BSC transfer with S OT Short Term Goal 2 (Week 1): Pt will complete LB washing with S OT Short Term Goal 3 (Week 1): Pt will complete grooming at sink with S OT Short Term Goal 4 (Week 1): Pt will use w/c to gather clothes in room for dressing task with S   Skilled Therapeutic Interventions/Progress Updates:    Pt in bed during session.  Therapist flattened out his hospital bed to work on supine to sit as he does not have a hospital bed at home.  Explained this to the pt and he replied, "OK, where's my bed."  Re-directed pt that he was in his hospital bed in the same room he has been in for 3 days.  Mod assist to transition to sitting from supine with max demonstrational cueing to roll to his right side and then push up using the bed rails.  After sitting up he needed min assist for initial balance, he reported that he needed to use the urinal.  Based on pt's body habitus did not feet we could get his clothing down in sitting and give him enough room to use the urinal.  Pt transitioned back to supine where he needed min assist to push his shorts and brief down.  He attempted to roll to this left side to use the urinal but was unsuccessful with attempt.  Therapist then helped him scoot to the top of the bed with max assist and HOB was elevated.  Therapist helped position urinal and pt then took hold of it.  Left pt in bed using urinal at end of session as he stated he has trouble going, especially if someone is watching.  Bed alarm turned back on and curtain pulled.  NT notified to check back on pt in a few mins to see his progress.     Therapy Documentation Precautions:  Precautions Precautions:  Fall Restrictions Weight Bearing Restrictions: Yes LLE Weight Bearing: Non weight bearing Other Position/Activity Restrictions: has R prosthtic leg for R BKA (OLD); L BKA (NEW)  Pain: Pain Assessment Pain Assessment: No/denies pain ADL: See Function Navigator for Current Functional Status.   Therapy/Group: Individual Therapy  Sariya Trickey OTR/L 07/28/2017, 3:50 PM

## 2017-07-28 NOTE — Progress Notes (Signed)
Social Work Assessment and Plan Social Work Assessment and Plan  Patient Details  Name: Justin Stone MRN: [id number] Date of Birth: [birth year]  Today's Date: 07/28/2017  Problem List:  Patient Active Problem List   Diagnosis Date Noted  . Post-operative pain   . Type 2 diabetes mellitus with peripheral neuropathy (HCC)   . Amputation of left lower extremity below knee (HCC) 07/25/2017  . S/P bilateral BKA (below knee amputation) (HCC)   . Unilateral complete BKA, left, sequela (HCC)   . Chronic pain syndrome   . Benign essential HTN   . Leukocytosis   . Acute blood loss anemia   . Tachycardia   . Hyponatremia   . AKI (acute kidney injury) (HCC) 07/22/2017  . Osteomyelitis of left foot (HCC)   . Diabetic wet gangrene of the foot (HCC) 07/21/2017  . Gangrene of left foot (HCC) 07/21/2017  . Unilateral complete BKA, left, initial encounter (HCC) 07/21/2017  . Insomnia 05/13/2016  . Umbilical hernia without obstruction and without gangrene 05/13/2016  . Spondylosis of lumbar region without myelopathy or radiculopathy 07/22/2015  . Obesity 06/29/2015  . 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 Type II diabetes mellitus, well controlled (HCC) 03/26/2015  . S/P BKA (below knee amputation), right (HCC) 03/26/2015  . Alcoholic cirrhosis of liver without ascites (HCC) 02/22/2015  . Chronic hepatitis C without hepatic coma (HCC) 02/22/2015  . Chronic low back pain 12/29/2014  . Essential hypertension, benign 12/29/2014  . Hyperlipidemia LDL goal <100 12/29/2014   Past Medical History:  Past Medical History:  Diagnosis Date  . Allergy    eye allergies  . Anxiety   . Arthritis   . Diabetes mellitus without complication (HCC)    takes oral meds now only  . GERD (gastroesophageal reflux disease)    past hx > 2 yrs ago  . Hepatitis    "Hepatitis C"- tx. with [facility]- now testing negative.  . Hyperlipidemia   . Hypertension   . Neuromuscular disorder  (HCC)    neuropathy hands/ feet.  . Osteopenia   . Prosthesis adjustment    right leg- below knee(weighs 10 lbs.)   Past Surgical History:  Past Surgical History:  Procedure Laterality Date  . AMPUTATION Left 07/21/2017   Procedure: LEFT  BELOW KNEE AMPUTATION;  Surgeon: Toni Arthurs, MD;  Location: [facility];  Service: Orthopedics;  Laterality: Left;  . HERNIA REPAIR Left    LIH  . INSERTION OF MESH N/A 06/15/2016   Procedure: INSERTION OF MESH;  Surgeon: Gaynelle Adu, MD;  Location: [facility];  Service: General;  Laterality: N/A;  . right leg removal     for diabetes, wears prosthesis -Right Below knee since '15  . UMBILICAL HERNIA REPAIR N/A 06/15/2016   Procedure: LAPAROSCOPIC ASSISTED REPAIR OF  UMBILICAL HERNIA;  Surgeon: Gaynelle Adu, MD;  Location: [facility];  Service: General;  Laterality: N/A;   Social History:  reports that he quit smoking about 31 years ago. His smoking use included cigarettes. He has a 20.00 pack-year smoking history. he has never used smokeless tobacco. He reports that he drinks about 4.8 oz of alcohol per week. He reports that he does not use drugs.  Family / Support Systems Marital Status: Single Patient Roles: Other (Comment)(sibling and friend) Other Supports: Park Breed [place]-good friend (like a brother)   Presenter, broadcasting in [place] to get worker the number to reach him Anticipated Caregiver: Self Ability/Limitations of Caregiver: Pt is quite confused and at times  not oriented to place or situation Caregiver Availability: Intermittent(Ned can stop by and check on him) Family Dynamics: Pt has a brother[place] and a [place] who [place]. He has friends but very few supports. 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 At this time pt doesn't seem to be fully capable of making his own decisions, will [facility]'s [facility] him   Abuse/Neglect Abuse/Neglect Assessment Can Be Completed: Yes Physical Abuse: Denies Verbal Abuse: Denies Sexual Abuse: Denies Exploitation of patient/patient's resources: Denies Self-Neglect: Denies  Emotional Status Pt's affect, behavior adn adjustment status: Pt is somewhat confused and not always appropriate in his answers to questions. He was not like this [facility]. He lived alone and was self sufficient, drove and provided for his needs. He is different then he was prior to surgery [facility] Recent Psychosocial Issues: other health issues were being manged by his MD's Pyschiatric History: History of anxiety due to his chronic pain issues but was managed by medication. Will ask neuro-psych to see while here for clarification and cognitive assessment, along with a speech evaluation Substance Abuse History: No issues  Patient / Family Perceptions, Expectations & Goals Pt/Family understanding of illness & functional limitations: Pt can report his leg was amputated and he is here to get better. He does talk with the MD and feels he knows what is going on. At other times he is confused and disoriented with therapy team. Premorbid pt/family roles/activities: Friend, brother, son etc Anticipated changes in roles/activities/participation: resume Pt/family expectations/goals: Pt states: " I need more pain medications."  Ned states: " He does seem to be out of it since his surgery."  [employer] Community Agencies: Other (Comment)(goes to chronic pain MD) Premorbid Home Care/DME Agencies: Other (Comment)(HH and OP in the past) Transportation  available at discharge: Ned and self-he drove some prior to admission with his prothesis Resource referrals recommended: Neuropsychology, Support group (specify)  Discharge Planning Living Arrangements: Alone Support Systems: Other relatives, Friends/neighbors Type of Residence: Private residence Insurance Resources: Media plannerrivate Insurance (specify)(UHC-Medicare) Financial Resources: [employer] Financial Screen Referred: No Living Expenses: Rent Money Management: Patient Does the patient have any problems obtaining your medications?: No Home Management: Self Patient/Family Preliminary Plans: Unsure at this time what the plan is, pt will need to clear cognitively and be able to be safe home alone to be able to go home. Ned can not stay with pt at home, can check on him and help some. Will work on safe plan and see what MD orders for pt. Social Work Anticipated Follow Up Needs: HH/OP, SNF, Support Group  Clinical Impression Somewhat confused pt who answers appropriately at times and other times is off the wall in his answers. MD is aware and is adjusting his pain medications and speech evaluation ordered. Will make referral for neuro-psych to see and work on a safe discharge plan. Park Breeded (friend) to get his brother's number for worker to contact. Work on discharge plans.  Lucy Chrisupree, Rondy Krupinski G 07/28/2017, 2:55 PM

## 2017-07-28 NOTE — Progress Notes (Signed)
Physical Therapy Session Note  Patient Details  Name: Justin Stone MRN: [id number] Date of Birth: 04-21-1961  Today's Date: 07/28/2017 PT Individual APOL:4103-013 AND 1555-1650 PT Individual Time Calculation (min): 40 min AND 55 min   Short Term Goals: Week 1:  PT Short Term Goal 1 (Week 1): Pt will don and doff RLE BKA prosthesis w/ set-up assist while sitting at EOB or in w/c PT Short Term Goal 2 (Week 1): Pt will self-propel w/c 150' w/ supervision PT Short Term Goal 3 (Week 1): Pt will transfer bed<>chair via slide board transfer w/ supervision  PT Short Term Goal 4 (Week 1): Pt will perform sit<>stand transfer w/ Max assist using LRAD PT Short Term Goal 5 (Week 1): Pt will maintain dynamic sitting balance during functional activity w/ supervision w/o UE support  Skilled Therapeutic Interventions/Progress Updates:   Session 1:  Pt supine upon arrival and agreeable to therapy, no c/o pain. Pt reports he "just got back from doctor's [place] Mod cues to reorient to hospital environment. Provided min assist to don LE garments. Transferred to EOB w/ supervision and to w/c via slide board transfer w/ close supervision. Increased time required for transfers for mod cues for set-up, safety awareness, and attention to task. Pt self-propelled w/c in 40-50' bouts w/ supervision including weaving in and around tight spaces w/ cones. Min cues for obstacle avoidance. Returned to room and ended session in w/c, call bell within reach and all needs met.   Session 2:  Pt supine upon arrival and agreeable to therapy, no c/o pain.Supervision to roll to don LE garments. Transferred to sitting up in bed w/ min assist from [facility] @ 0 deg and performed AP transfer w/ mod assist to w/c. Multiple LOB posteriorly onto w/c, max assist to correct to upright. Pt self-propelled w/c to therapy, 100' w/ supervision before requiring a rest break. Worked on trunk control in seated w/  anterior weight-shifting to reach forward for bilateral UE task. Pt reports "I just ate and my stomach is too big to do this". Encouraged pt that he can perform task safely and successfully, mod cues to perform cognitive matching task and to attend to task. Performed arm ergometer 5 min x2 @ L1.3 for UE strengthening and endurance, min cues to attend to task and for technique. Returned to room, total assist in w/c, transferred w/c to EOB via slide board transfer w/ mod assist. Ended session in supine, call bell within reach and all needs met.   Therapy Documentation Precautions:  Precautions Precautions: Fall Restrictions Weight Bearing Restrictions: Yes LLE Weight Bearing: Non weight bearing Other Position/Activity Restrictions: has R prosthtic leg for R BKA (OLD); L BKA (NEW) General:   Vital Signs: Therapy Vitals Temp: 97.6 F (36.4 C) Temp Source: Oral Pulse Rate: (!) 105 Resp: 16 BP: (!) 149/74 Patient Position (if appropriate): Lying Oxygen Therapy SpO2: 98 % Pain: Pain Assessment Pain Assessment: No/denies pain Mobility:   Locomotion :    Trunk/Postural Assessment :    Balance:   Exercises:   Other Treatments:     See Function Navigator for Current Functional Status.   Therapy/Group: Individual Therapy  Gayle Martinez K Arnette 07/28/2017, 4:52 PM

## 2017-07-28 NOTE — Progress Notes (Signed)
[place] PHYSICAL MEDICINE & REHABILITATION     PROGRESS NOTE  Subjective/Complaints:  No issues overnight no pain complaints  ROS: Denies CP, SOB, N/V/D.  Objective: Vital Signs: Blood pressure (!) 149/74, pulse (!) 105, temperature 97.6 F (36.4 C), temperature source Oral, resp. rate 16, height 5\' 7"  (1.702 m), weight 113.9 kg (251 lb 1.7 oz), SpO2 98 %. No results found. Recent Labs    07/26/17 0614 07/28/17 0715  WBC 13.3* 15.5*  HGB 11.7* 10.8*  HCT 35.6* 33.7*  PLT 360 343   Recent Labs    07/26/17 0614 07/28/17 0715  NA 128* 127*  K 4.2 3.4*  CL 98* 97*  GLUCOSE 146* 133*  BUN 18 29*  CREATININE 1.07 1.31*  CALCIUM 8.9 8.9   CBG (last 3)  Recent Labs    07/27/17 2057 07/28/17 0628 07/28/17 1158  GLUCAP 127* 123* 119*    Wt Readings from Last 3 Encounters:  07/25/17 113.9 kg (251 lb 1.7 oz)  07/21/17 113.4 kg (250 lb)  07/21/17 113.8 kg (250 lb 12.8 oz)    Physical Exam:  BP (!) 149/74 (BP Location: Left Arm)   Pulse (!) 105   Temp 97.6 F (36.4 C) (Oral)   Resp 16   Ht 5\' 7"  (1.702 m)   Wt 113.9 kg (251 lb 1.7 oz)   SpO2 98%   BMI 39.33 kg/m  General: NAD. Vitals stable. HENT: Normocephalic. Atraumatic Eyes: EOMI. No discharge Cardiac. +Tachycardia. Regular rhythm.  No JVD. Respiratory. Clear to auscultation. Unlabored.  Abdomen: good bowel sounds. nondistended Neurological. Alert and oriented.  Followed full commands. Motor: B/l UE 5/5 proximal to distal LLE: HF 4/5 (stable) RLE: HF, KE 4+/5 Skin. Left BKA with dressing c/d/i. Right BKA with small ulcer area at base of stump.   Assessment/Plan: 1. Functional deficits secondary to bilateral BKA which require 3+ hours per day of interdisciplinary therapy in a comprehensive inpatient rehab setting. Physiatrist is providing close team supervision and 24 hour management of active medical problems listed below. Physiatrist and rehab team continue to assess barriers to  discharge/monitor patient progress toward functional and medical goals.  Function:  Bathing Bathing position   Position: Wheelchair/chair at sink  Bathing parts Body parts bathed by patient: Right arm, Chest, Abdomen, Left arm Body parts bathed by helper: Back  Bathing assist Assist Level: Touching or steadying assistance(Pt > 75%)      Upper Body Dressing/Undressing Upper body dressing   What is the patient wearing?: Pull over shirt/dress     Pull over shirt/dress - Perfomed by patient: Thread/unthread right sleeve, Thread/unthread left sleeve, Put head through opening Pull over shirt/dress - Perfomed by helper: Pull shirt over trunk        Upper body assist Assist Level: Supervision or verbal cues      Lower Body Dressing/Undressing Lower body dressing   What is the patient wearing?: Pants     Pants- Performed by patient: Thread/unthread right pants leg, Thread/unthread left pants leg, Pull pants up/down                        Lower body assist Assist for lower body dressing: Touching or steadying assistance (Pt > 75%)      Toileting Toileting   Toileting steps completed by patient: Performs perineal hygiene Toileting steps completed by helper: Adjust clothing prior to toileting, Adjust clothing after toileting(per Josem KaufmannKasey Chapman, NT)    Toileting assist     Transfers Chair/bed  transfer   Chair/bed transfer method: Lateral scoot Chair/bed transfer assist level: Touching or steadying assistance (Pt > 75%) Chair/bed transfer assistive device: Sliding board     Locomotion Ambulation Ambulation activity did not occur: Safety/medical concerns         Wheelchair   Type: Manual Max wheelchair distance: 85' Assist Level: Supervision or verbal cues  Cognition Comprehension Comprehension assist level: Understands basic 50 - 74% of the time/ requires cueing 25 - 49% of the time  Expression Expression assist level: Expresses basic 50 - 74% of the  time/requires cueing 25 - 49% of the time. Needs to repeat parts of sentences.  Social Interaction Social Interaction assist level: Interacts appropriately 75 - 89% of the time - Needs redirection for appropriate language or to initiate interaction.  Problem Solving Problem solving assist level: Solves basic 25 - 49% of the time - needs direction more than half the time to initiate, plan or complete simple activities  Memory Memory assist level: Recognizes or recalls 25 - 49% of the time/requires cueing 50 - 75% of the time    Medical Problem List and Plan: 1.  Decreased functional mobility secondary to left BKA 07/21/2017 as well as history of right BKA. Patient does use a right prosthesis   Cont CIR 2.  DVT Prophylaxis/Anticoagulation: Subcutaneous Lovenox. Monitor for any bleeding episodes 3. Pain Management/chronic pain: Soma 350 mg 3 times a day, Neurontin 1200 mg 3 times a day, MS Contin 15 mg every 12 hours, Robaxin and oxycodone as needed 4. Mood: Effexor 75 mg twice a day Trazodone 25 mg daily at bedtime as needed 5. Neuropsych: This patient is capable of making decisions on his own behalf. 6. Skin/Wound Care: Routine skin checks 7. Fluids/Electrolytes/Nutrition: Routine I&O's poor fluid intake rising BUN and creatinine will start IV fluids 8. Acute blood loss anemia. Continue iron supplement.    Hb 11.7 on 1/1   Labs ordered for tomorrow   Cont to monitor 9. Diabetes mellitus and peripheral neuropathy. Hemoglobin A1c 6.5. Currently with SSI. Check blood sugars before meals and at bedtime. Diabetic teaching. Patient on Glucophage 500 mg twice a day prior to admission. Resume as needed   Metformin 500 daily started on 1/2   Monitor with increased mobility 10. Hypertension. Norvasc 10 mg daily.   Improving on 1/2 11. Hyperlipidemia. Zoc2or 12. Constipation. Laxative assistance 13. Hyponatremia   Na 127 on 1/1   IV .9NS   Cont to monitor 14. Leukocytosis   Afebrile   WBCs 15K  1/3   Cont to monitor  LOS (Days) 3 A FACE TO FACE EVALUATION WAS PERFORMED  Erick Colace 07/28/2017 4:33 PM

## 2017-07-28 NOTE — Progress Notes (Signed)
Occupational Therapy Session Note  Patient Details  Name: Justin Stone MRN: [id number] Date of Birth: [birth year]  Today's Date: 07/28/2017 OT Individual Time: 1003-1100 OT Individual Time Calculation (min): 57 min    Short Term Goals: Week 1:  OT Short Term Goal 1 (Week 1): Pt will complete BSC transfer with S OT Short Term Goal 2 (Week 1): Pt will complete LB washing with S OT Short Term Goal 3 (Week 1): Pt will complete grooming at sink with S OT Short Term Goal 4 (Week 1): Pt will use w/c to gather clothes in room for dressing task with S   Skilled Therapeutic Interventions/Progress Updates:    Pt completed bathing and dressing of UB at the sink to start session.  Pt still demonstrating confusion at the same level as yesterday.  Noted at start of session pt's breakfast was sitting in front of him and he had not touched it yet.  This was at roughly 10:05.  Pt unaware of the time of day or that this was his breakfast.  Discussed with pt his current cognitive issues and he was unable to demonstrate awareness of them.  Pushed him over to the sink and gave him mod instructional cueing to work on washing his UB and donn new shirt.  He was able to complete with increased time and supervision.  He also completed oral hygiene with setup as well.  He reported completing LB bathing earlier with "somebody else" but could not elaborate on therapist vs nursing, or tech.  Confirmed with NT that he had a large BM this am and she had assisted him.  Finished session with pt completing wheelchair mobility up the hallway toward the therapy gym.  Supervision with rest breaks needed after approximately 75 ft.  Transported pt back to the room at end of wheelchair mobility where he was left next to the bed in the wheelchair with call button and phone in reach.    Therapy Documentation Precautions:  Precautions Precautions: Fall Restrictions Weight Bearing Restrictions: Yes LLE Weight Bearing: Non weight  bearing Other Position/Activity Restrictions: has R prosthtic leg for R BKA (OLD); L BKA (NEW)  Pain: Pain Assessment Pain Assessment: Faces Pain Score: 0-No pain ADL: ADL Eating: Modified independent Where Assessed-Eating: Wheelchair Grooming: Contact guard Where Assessed-Grooming: Wheelchair Upper Body Bathing: Minimal cueing, Contact guard Where Assessed-Upper Body Bathing: Wheelchair Lower Body Bathing: Minimal cueing, Contact guard Where Assessed-Lower Body Bathing: Bed level Upper Body Dressing: Minimal assistance Where Assessed-Upper Body Dressing: Wheelchair Lower Body Dressing: Minimal assistance Where Assessed-Lower Body Dressing: Bed level Toileting: Moderate assistance Where Assessed-Toileting: Bedside Commode Toilet Transfer: Moderate assistance Toilet Transfer Method: Scientist, research (life sciences)Transfer board Toilet Transfer Equipment: Drop arm bedside commode Tub/Shower Transfer: Not assessed Film/video editorWalk-In Shower Transfer: Not assessed ADL Comments: cues for sequencing and directions  See Function Navigator for Current Functional Status.   Therapy/Group: Individual Therapy  Kazuo Durnil OTR/L 07/28/2017, 12:23 PM

## 2017-07-29 ENCOUNTER — Inpatient Hospital Stay (HOSPITAL_COMMUNITY): Payer: Medicare Other | Admitting: Speech Pathology

## 2017-07-29 ENCOUNTER — Inpatient Hospital Stay (HOSPITAL_COMMUNITY): Payer: Medicare Other | Admitting: Occupational Therapy

## 2017-07-29 ENCOUNTER — Inpatient Hospital Stay (HOSPITAL_COMMUNITY): Payer: Medicare Other | Admitting: Physical Therapy

## 2017-07-29 LAB — GLUCOSE, CAPILLARY
GLUCOSE-CAPILLARY: 139 mg/dL — AB (ref 65–99)
Glucose-Capillary: 109 mg/dL — ABNORMAL HIGH (ref 65–99)
Glucose-Capillary: 137 mg/dL — ABNORMAL HIGH (ref 65–99)
Glucose-Capillary: 123 mg/dL — ABNORMAL HIGH (ref 65–99)

## 2017-07-29 LAB — BASIC METABOLIC PANEL
Anion gap: 11 (ref 5–15)
BUN: 29 mg/dL — AB (ref 6–20)
CO2: 18 mmol/L — ABNORMAL LOW (ref 22–32)
Calcium: 9.3 mg/dL (ref 8.9–10.3)
Chloride: 98 mmol/L — ABNORMAL LOW (ref 101–111)
Creatinine, Ser: 1.4 mg/dL — ABNORMAL HIGH (ref 0.61–1.24)
GFR calc Af Amer: >60 mL/min (ref >60)
GFR, EST NON AFRICAN AMERICAN: 55 mL/min — AB (ref >60)
GLUCOSE: 160 mg/dL — AB (ref 65–99)
POTASSIUM: 3.8 mmol/L (ref 3.5–5.1)
Sodium: 127 mmol/L — ABNORMAL LOW (ref 135–145)

## 2017-07-29 MED ORDER — SODIUM CHLORIDE 0.9 % IV SOLN
INTRAVENOUS | Status: DC
  Administered 2017-07-29: 11:00:00 via INTRAVENOUS

## 2017-07-29 MED ORDER — SODIUM CHLORIDE 0.45 % IV SOLN: INTRAVENOUS | Status: DC

## 2017-07-29 MED ORDER — FLEET ENEMA 7-19 GM/118ML RE ENEM
1.0000 | ENEMA | Freq: Once | RECTAL | Status: DC
  Filled 2017-07-29 (×2): qty 1

## 2017-07-29 MED ORDER — POTASSIUM CHLORIDE CRYS ER 10 MEQ PO TBCR
10.0000 meq | EXTENDED_RELEASE_TABLET | Freq: Every day | ORAL | Status: DC
  Administered 2017-07-29 – 2017-08-02 (×5): 10 meq via ORAL
  Filled 2017-07-29 (×6): qty 1

## 2017-07-29 NOTE — Progress Notes (Signed)
Occupational Therapy Session Note  Patient Details  Name: Justin Stone MRN: [id number] Date of Birth: Mar 15, 1961  Today's Date: 07/29/2017 OT Individual Time: 1450-1535 OT Individual Time Calculation (min): 45 min    Short Term Goals: Week 1:  OT Short Term Goal 1 (Week 1): Pt will complete BSC transfer with S OT Short Term Goal 2 (Week 1): Pt will complete LB washing with S OT Short Term Goal 3 (Week 1): Pt will complete grooming at sink with S OT Short Term Goal 4 (Week 1): Pt will use w/c to gather clothes in room for dressing task with S   Skilled Therapeutic Interventions/Progress Updates:    Pt completed bathing and dressing sitting on the edge of the bed.  Mod assist for initial supine to sit in order to work on UB bathing.  He declined washing peri area and buttocks as this was done earlier after BM.  Mod instructional cueing to sequence through bathing task and follow directions.  Pt still demonstrating confusion throughout session, thinking that he is hearing his brother outside of the room.  Pt unaware of where he was this session or why he was in the hospital as well.  Max assist needed for donning sweatpants in sitting with lateral leans side to side on the bed.  He was then able to complete scoot pivot transfer to the wheelchair with mod assist as well.  Pt finished session in wheelchair working on lunch with call button and phone in reach.    Therapy Documentation Precautions:  Precautions Precautions: Fall Restrictions Weight Bearing Restrictions: Yes LLE Weight Bearing: Non weight bearing Other Position/Activity Restrictions: has R prosthtic leg for R BKA (OLD); L BKA (NEW)  Pain: Pain Assessment Pain Assessment: Faces Faces Pain Scale: Hurts little more Pain Type: Acute pain Pain Location: Leg Pain Orientation: Left Pain Descriptors / Indicators: Discomfort Pain Onset: With Activity Pain Intervention(s): Repositioned ADL: See Function Navigator for Current  Functional Status.   Therapy/Group: Individual Therapy  Justin Stone OTR/L 07/29/2017, 4:32 PM

## 2017-07-29 NOTE — Evaluation (Signed)
Speech Language Pathology Assessment and Plan  Patient Details  Name: Justin Stone MRN: [id number] Date of Birth: August 08, 1960  SLP Diagnosis: Cognitive Impairments  Rehab Potential: Good ELOS: anticipated d/c date of 1/12    Today's Date: 07/29/2017 SLP Individual Time: 0909-1000 SLP Individual Time Calculation (min): 51 min   Problem List:  Patient Active Problem List   Diagnosis Date Noted  . Post-operative pain   . Type 2 diabetes mellitus with peripheral neuropathy (HCC)   . Amputation of left lower extremity below knee ([place]) 07/25/2017  . S/P bilateral BKA (below knee amputation) ([place])   . Unilateral complete BKA, left, sequela ([place])   . Chronic pain syndrome   . Benign essential HTN   . Leukocytosis   . Acute blood loss anemia   . Tachycardia   . Hyponatremia   . AKI (acute kidney injury) ([place]) 07/22/2017  . Osteomyelitis of left foot ([place])   . Diabetic wet gangrene of the foot ([place]) 07/21/2017  . Gangrene of left foot ([place]) 07/21/2017  . Unilateral complete BKA, left, initial encounter ([place]) 07/21/2017  . Insomnia 05/13/2016  . Umbilical hernia without obstruction and without gangrene 05/13/2016  . Spondylosis of lumbar region without myelopathy or radiculopathy 07/22/2015  . Obesity 06/29/2015  . 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 Type II diabetes mellitus, well controlled ([place]) 03/26/2015  . S/P BKA (below knee amputation), right ([place]) 03/26/2015  . Alcoholic cirrhosis of liver without ascites ([place]) 02/22/2015  . Chronic hepatitis C without hepatic coma ([place]) 02/22/2015  . Chronic low back pain 12/29/2014  . Essential hypertension, benign 12/29/2014  . Hyperlipidemia LDL goal <100 12/29/2014   Past Medical History:  Past Medical History:  Diagnosis Date  . Allergy    eye allergies  . Anxiety   . Arthritis   . Diabetes mellitus without complication ([place])    takes oral meds now only  . GERD (gastroesophageal reflux  disease)    past hx > 2 yrs ago  . Hepatitis    "Hepatitis C"- tx. with Harv[facility]- now testing negative.  . Hyperlipidemia   . Hypertension   . Neuromuscular disorder (HCC)    neuropathy hands/ feet.  . Osteopenia   . Prosthesis adjustment    right leg- below knee(weighs 10 lbs.)   Past Surgical History:  Past Surgical History:  Procedure Laterality Date  . AMPUTATION Left 07/21/2017   Procedure: LEFT  BELOW KNEE AMPUTATION;  Surgeon: Justin Simmer, MD;  Location: [facility];  Service: Orthopedics;  Laterality: Left;  . HERNIA REPAIR Left    LIH  . INSERTION OF MESH N/A 06/15/2016   Procedure: INSERTION OF MESH;  Surgeon: Justin Pickerel, MD;  Location: [facility];  Service: General;  Laterality: N/A;  . right leg removal     for diabetes, wears prosthesis -Right Below knee since '15  . UMBILICAL HERNIA REPAIR N/A 06/15/2016   Procedure: LAPAROSCOPIC ASSISTED REPAIR OF  UMBILICAL HERNIA;  Surgeon: Justin Pickerel, MD;  Location: [facility];  Service: General;  Laterality: N/A;    Assessment / Plan / Recommendation Clinical Impression  57 year old right-handed male with history of diabetes mellitus, chronic pain syndrome, hypertension, right BKA several years ago. History taken from chart review and patient. Patient lives alone. Independent with a cane prior to admission as well as right prosthesis. Presented 07/21/2017 with ulceration to left foot and no change with conservative care. X-rays of left foot consistent with cellulitis and osteomyelitis. Underwent left BKA 07/21/2017  per Dr. Doran Stone. Hospital course pain management. Intermittent bouts of confusion with narcotics adjusted. Acute blood loss anemia 10.2 and monitored. Leukocytosis 16,000. Subcutaneous Lovenox for DVT prophylaxis. Physical and occupational therapy evaluations completed with recommendations of physical medicine rehabilitation consult. Patient was admitted for comprehensive rehabilitation program. SLP evaluation ordered and completed on  07/29/2017 due to persistent cognitive deficits impacting pt's participation in therapies.  Results are as follows:  Pt presents with severe cognitive disruption of unknown etiology.  Per chart review, pt had a CTH in 2013 for "mental confusion" with findings of mild atrophy and white matter disease, advanced for age. Question vascular dementia versus residual medication effects.  Pt needs max assist to complete basic cognitive tasks or provide basic, personally relevant information due to decreased storage and retrieval of information, decreased working memory, decreased sustained attention to tasks, decreased intellectual awareness of deficits, and decreased functional problem solving.  As a result, pt would benefit from skilled ST while inpatient in order to maximize functional independence and reduce burden of care prior to discharge.  Anticipate that pt will need 24/7 supervision at discharge in addition to [facility] follow up at next level of care.      Skilled Therapeutic Interventions          Cognitive-linguistic evaluation completed.  See above.    SLP Assessment  Patient will need skilled Speech Lanaguage Pathology Services during CIR admission    Recommendations  Recommendations for Other Services: Neuropsych consult Patient destination: [facility] (SNF) Follow up Recommendations: 24 hour supervision/assistance;Home Health SLP;Outpatient SLP Equipment Recommended: None recommended by SLP    SLP Frequency 3 to 5 out of 7 days   SLP Duration  SLP Intensity  SLP Treatment/Interventions anticipated d/c date of 1/12  Minumum of 1-2 x/day, 30 to 90 minutes  Cognitive remediation/compensation;Cueing hierarchy;Functional tasks;Environmental controls;Internal/external aids;Patient/family education    Pain    Prior Functioning Cognitive/Linguistic Baseline: Information not available(pt is very unclear regarding baseline biographical information ) Type of Home: House  Lives With:  Alone Available Help at Discharge: Available PRN/intermittently Education: 10th grade  Vocation: Retired  Function:  Eating Eating                 Cognition Comprehension Comprehension assist level: Understands basic 50 - 74% of the time/ requires cueing 25 - 49% of the time  Expression   Expression assist level: Expresses basic 50 - 74% of the time/requires cueing 25 - 49% of the time. Needs to repeat parts of sentences.  Social Interaction Social Interaction assist level: Interacts appropriately 50 - 74% of the time - May be physically or verbally inappropriate.  Problem Solving Problem solving assist level: Solves basic 25 - 49% of the time - needs direction more than half the time to initiate, plan or complete simple activities  Memory Memory assist level: Recognizes or recalls 25 - 49% of the time/requires cueing 50 - 75% of the time   Short Term Goals: Week 1: SLP Short Term Goal 1 (Week 1): Pt will recall basic, daily information with mod verbal cues for use of external aids.   SLP Short Term Goal 2 (Week 1): Pt will sustain his attention to basic, functional tasks for 5 minute intervals with mod verbal cues for redirection.   SLP Short Term Goal 3 (Week 1): Pt will complete basic, familiar tasks with mod assist for functional problem solving.   SLP Short Term Goal 4 (Week 1): Pt will recognize and correct errors when completing  basic, familiar tasks with max assist.    Refer to Care Plan for Long Term Goals  Recommendations for other services: Neuropsych  Discharge Criteria: Patient will be discharged from SLP if patient refuses treatment 3 consecutive times without medical reason, if treatment goals not met, if there is a change in medical status, if patient makes no progress towards goals or if patient is discharged from hospital.  The above assessment, treatment plan, treatment alternatives and goals were discussed and mutually agreed upon: No family available/patient  unable  Emilio Math 07/29/2017, 3:56 PM

## 2017-07-29 NOTE — Progress Notes (Signed)
Physical Therapy Session Note  Patient Details  Name: Justin Stone MRN: [id number] Date of Birth: 05/02/1961  Today's Date: 07/29/2017 PT Individual Time: 1015-1100 AND 2800-3491 PT Individual Time Calculation (min): 45 min AND 50 min   Short Term Goals: Week 1:  PT Short Term Goal 1 (Week 1): Pt will don and doff RLE BKA prosthesis w/ set-up assist while sitting at EOB or in w/c PT Short Term Goal 2 (Week 1): Pt will self-propel w/c 150' w/ supervision PT Short Term Goal 3 (Week 1): Pt will transfer bed<>chair via slide board transfer w/ supervision  PT Short Term Goal 4 (Week 1): Pt will perform sit<>stand transfer w/ Max assist using LRAD PT Short Term Goal 5 (Week 1): Pt will maintain dynamic sitting balance during functional activity w/ supervision w/o UE support  Skilled Therapeutic Interventions/Progress Updates:   Session 1:  Pt supine upon arrival and agreeable to therapy, no c/o pain. Missed first 15 min of session 2/2 nursing care. Pt reporting he needed to use toilet. Practiced performing AP transfer from EOB to toilet w/ mod-max assist overall for verbal, manual, and tactile cues for technique and sequencing. Increased time required for transfer because of cues required and delayed processing. Pt had continent BM, max cues for pericare prior to transferring back to EOB via AP transfer. Transferred to supine and performed multiple rolls bilaterally while PT provided total assist to manage brief and change sheets as pt had episode of urinary incontinence. Verbal and tactile cues for rolling technique w/ min assist and extensive use of bedrails. Ended session in supine, call bell within reach and all needs met.   Session 2:  Pt sleeping in w/c upon arrival and agreeable to therapy once woken up. Max stimulation to wake up and remain awake. Performed cognitive task in w/c using bilateral UEs, verbal cues for attention to task, problem solving, and sequencing. Pt reporting he needed to  toilet. Attempted slide board transfer w/c to bed, however pt unable to sequence and perform despite max assist w/ facilitating placement, technique, and weight-shifting. Unable to total assist pt 2/2 body habitus. Made multiple attempts at slide board transfer before attempting AP transfer. AP transfer to EOB required multiple attempts 2/2 sequencing and technique cues required, transfer successful w/ max assist. Total assist to place bed pan. Ended session in care of NT, call bell within reach and all needs met.   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   Therapy/Group: Individual Therapy  Jem Castro K Arnette 07/29/2017, 11:53 AM

## 2017-07-29 NOTE — Progress Notes (Signed)
[place] PHYSICAL MEDICINE & REHABILITATION     PROGRESS NOTE  Subjective/Complaints:    ROS: Denies CP, SOB, N/V/D.  Objective: Vital Signs: Blood pressure (!) 145/79, pulse (!) 108, temperature 98 F (36.7 C), temperature source Oral, resp. rate 18, height 5\' 7"  (1.702 m), weight 113.9 kg (251 lb 1.7 oz), SpO2 96 %. No results found. Recent Labs    07/28/17 0715  WBC 15.5*  HGB 10.8*  HCT 33.7*  PLT 343   Recent Labs    07/28/17 0715  NA 127*  K 3.4*  CL 97*  GLUCOSE 133*  BUN 29*  CREATININE 1.31*  CALCIUM 8.9   CBG (last 3)  Recent Labs    07/28/17 1748 07/28/17 2117 07/29/17 0712  GLUCAP 129* 130* 139*    Wt Readings from Last 3 Encounters:  07/25/17 113.9 kg (251 lb 1.7 oz)  07/21/17 113.4 kg (250 lb)  07/21/17 113.8 kg (250 lb 12.8 oz)    Physical Exam:  BP (!) 145/79 (BP Location: Left Arm)   Pulse (!) 108   Temp 98 F (36.7 C) (Oral)   Resp 18   Ht 5\' 7"  (1.702 m)   Wt 113.9 kg (251 lb 1.7 oz)   SpO2 96%   BMI 39.33 kg/m  General: NAD. Vitals stable. HENT: Normocephalic. Atraumatic Eyes: EOMI. No discharge Cardiac. +Tachycardia. Regular rhythm.  No JVD. Respiratory. Clear to auscultation. Unlabored.  Abdomen: good bowel sounds. nondistended Neurological. Alert and oriented.  Followed full commands. Motor: B/l UE 5/5 proximal to distal LLE: HF 4/5 (stable) RLE: HF, KE 4+/5 Skin. Left BKA wwithout erythema or drainage, sutures intact Right BKA with small ulcer area at base of stump.Clean no odor or drainage   Assessment/Plan: 1. Functional deficits secondary to bilateral BKA which require 3+ hours per day of interdisciplinary therapy in a comprehensive inpatient rehab setting. Physiatrist is providing close team supervision and 24 hour management of active medical problems listed below. Physiatrist and rehab team continue to assess barriers to discharge/monitor patient progress toward functional and medical  goals.  Function:  Bathing Bathing position   Position: Wheelchair/chair at sink  Bathing parts Body parts bathed by patient: Right arm, Chest, Abdomen, Left arm Body parts bathed by helper: Back  Bathing assist Assist Level: Touching or steadying assistance(Pt > 75%)      Upper Body Dressing/Undressing Upper body dressing   What is the patient wearing?: Pull over shirt/dress     Pull over shirt/dress - Perfomed by patient: Thread/unthread right sleeve, Thread/unthread left sleeve, Put head through opening Pull over shirt/dress - Perfomed by helper: Pull shirt over trunk        Upper body assist Assist Level: Supervision or verbal cues      Lower Body Dressing/Undressing Lower body dressing   What is the patient wearing?: Pants     Pants- Performed by patient: Thread/unthread right pants leg, Thread/unthread left pants leg, Pull pants up/down                        Lower body assist Assist for lower body dressing: Touching or steadying assistance (Pt > 75%)      Toileting Toileting   Toileting steps completed by patient: Performs perineal hygiene Toileting steps completed by helper: Adjust clothing prior to toileting, Adjust clothing after toileting(per Josem KaufmannKasey Chapman, NT)    Toileting assist     Transfers Chair/bed transfer   Chair/bed transfer method: Lateral scoot Chair/bed transfer assist level: Moderate  assist (Pt 50 - 74%/lift or lower) Chair/bed transfer assistive device: Sliding board     Locomotion Ambulation Ambulation activity did not occur: Safety/medical concerns         Wheelchair   Type: Manual Max wheelchair distance: 100' Assist Level: Supervision or verbal cues  Cognition Comprehension Comprehension assist level: Understands basic 50 - 74% of the time/ requires cueing 25 - 49% of the time  Expression Expression assist level: Expresses basic 50 - 74% of the time/requires cueing 25 - 49% of the time. Needs to repeat parts of  sentences.  Social Interaction Social Interaction assist level: Interacts appropriately 75 - 89% of the time - Needs redirection for appropriate language or to initiate interaction.  Problem Solving Problem solving assist level: Solves basic 25 - 49% of the time - needs direction more than half the time to initiate, plan or complete simple activities  Memory Memory assist level: Recognizes or recalls 25 - 49% of the time/requires cueing 50 - 75% of the time    Medical Problem List and Plan: 1.  Decreased functional mobility secondary to left BKA 07/21/2017 as well as history of right BKA. Patient does use a right prosthesis   Cont CIR PT, OT 2.  DVT Prophylaxis/Anticoagulation: Subcutaneous Lovenox. Monitor for any bleeding episodes 3. Pain Management/chronic pain: Soma 350 mg 3 times a day, Neurontin 1200 mg 3 times a day, MS Contin 15 mg every 12 hours, Robaxin and oxycodone as needed 4. Mood: Effexor 75 mg twice a day Trazodone 25 mg daily at bedtime as needed 5. Neuropsych: This patient is capable of making decisions on his own behalf. 6. Skin/Wound Care: Routine skin checks 7. Fluids/Electrolytes/Nutrition: Routine I&O's poor fluid intake rising BUN and creatinine will start IV fluids 8. Acute blood loss anemia. Continue iron supplement.    Hb 11.7 on 1/1    Cont to monitor 9. Diabetes mellitus and peripheral neuropathy. Hemoglobin A1c 6.5. Currently with SSI. Check blood sugars before meals and at bedtime. Diabetic teaching. Patient on Glucophage 500 mg twice a day prior to admission. Resume as needed   Metformin 500 daily started on 1/2   Monitor with increased mobility 10. Hypertension. Norvasc 10 mg daily.   Improving on 1/2 11. Hyperlipidemia. Zoc2or 12. Constipation. Laxative assistance 13. Hyponatremia   Na 127 on 1/1   IV .9NS to start today 50ml /hr   Cont to monitor 14. Leukocytosis- BKA wounds look good   Afebrile   WBCs 15K 1/3   Cont to monitor  LOS (Days) 4 A  FACE TO FACE EVALUATION WAS PERFORMED  Erick Colace 07/29/2017 7:28 AM

## 2017-07-29 NOTE — Plan of Care (Signed)
  Not Progressing RH BOWEL ELIMINATION RH STG MANAGE BOWEL WITH ASSISTANCE Description STG Manage Bowel with min Assistance.  07/29/2017 1004 - Not Progressing by Alfonso RamusEvans, Payge Eppes S, RN RH STG MANAGE BOWEL W/MEDICATION W/ASSISTANCE Description STG Manage Bowel with Medication with min Assistance.  07/29/2017 1004 - Not Progressing by Alfonso RamusEvans, Jayleena Stille S, RN RH SKIN INTEGRITY RH STG SKIN FREE OF INFECTION/BREAKDOWN Description Patients skin will remain free from further infection or breakdown with mod assist.  07/29/2017 1004 - Not Progressing by Alfonso RamusEvans, Geisha Abernathy S, RN Note Pt not retaining wound care education  RH STG MAINTAIN SKIN INTEGRITY WITH ASSISTANCE Description STG Maintain Skin Integrity With mod Assistance.  07/29/2017 1004 - Not Progressing by Alfonso RamusEvans, Gatsby Chismar S, RN RH STG ABLE TO PERFORM INCISION/WOUND CARE W/ASSISTANCE Description STG Able To Perform Incision/Wound Care With total Assistance.  07/29/2017 1004 - Not Progressing by Alfonso RamusEvans, Braelyn Jenson S, RN RH KNOWLEDGE DEFICIT LIMB LOSS RH STG INCREASE KNOWLEDGE OF SELF CARE AFTER LIMB LOSS Description Patient will verbalize understanding of limb loss education materials as presented by staff with min assist.  07/29/2017 1004 - Not Progressing by Alfonso RamusEvans, Makynzi Eastland S, RN

## 2017-07-30 ENCOUNTER — Inpatient Hospital Stay (HOSPITAL_COMMUNITY): Payer: Medicare Other | Admitting: Occupational Therapy

## 2017-07-30 ENCOUNTER — Inpatient Hospital Stay (HOSPITAL_COMMUNITY): Payer: Medicare Other | Admitting: Speech Pathology

## 2017-07-30 ENCOUNTER — Inpatient Hospital Stay (HOSPITAL_COMMUNITY): Payer: Medicare Other | Admitting: Physical Therapy

## 2017-07-30 ENCOUNTER — Inpatient Hospital Stay (HOSPITAL_COMMUNITY): Payer: Medicare Other

## 2017-07-30 DIAGNOSIS — M7989 Other specified soft tissue disorders: Secondary | ICD-10-CM

## 2017-07-30 LAB — URINALYSIS, ROUTINE W REFLEX MICROSCOPIC
BILIRUBIN URINE: NEGATIVE
Glucose, UA: NEGATIVE mg/dL
HGB URINE DIPSTICK: NEGATIVE
Ketones, ur: 5 mg/dL — AB
Leukocytes, UA: NEGATIVE
Nitrite: NEGATIVE
Protein, ur: NEGATIVE mg/dL
Specific Gravity, Urine: 1.018 (ref 1.005–1.030)
pH: 5 (ref 5.0–8.0)

## 2017-07-30 LAB — GLUCOSE, CAPILLARY
GLUCOSE-CAPILLARY: 120 mg/dL — AB (ref 65–99)
Glucose-Capillary: 125 mg/dL — ABNORMAL HIGH (ref 65–99)
Glucose-Capillary: 134 mg/dL — ABNORMAL HIGH (ref 65–99)
Glucose-Capillary: 200 mg/dL — ABNORMAL HIGH (ref 65–99)

## 2017-07-30 MED ORDER — OXYCODONE HCL 5 MG PO TABS
5.0000 mg | ORAL_TABLET | Freq: Four times a day (QID) | ORAL | Status: DC | PRN
  Administered 2017-07-30: 5 mg via ORAL
  Filled 2017-07-30: qty 1

## 2017-07-30 NOTE — Progress Notes (Addendum)
Physical Therapy Session Note  Patient Details  Name: Justin Stone MRN: [id number] Date of Birth: 1960-08-17  Today's Date: 07/30/2017 PT Individual Time: 1335(make up time from previous day)-1345 PT Individual Time Calculation (min): 10 min   Short Term Goals: Week 1:  PT Short Term Goal 1 (Week 1): Pt will don and doff RLE BKA prosthesis w/ set-up assist while sitting at EOB or in w/c PT Short Term Goal 2 (Week 1): Pt will self-propel w/c 150' w/ supervision PT Short Term Goal 3 (Week 1): Pt will transfer bed<>chair via slide board transfer w/ supervision  PT Short Term Goal 4 (Week 1): Pt will perform sit<>stand transfer w/ Max assist using LRAD PT Short Term Goal 5 (Week 1): Pt will maintain dynamic sitting balance during functional activity w/ supervision w/o UE support  Skilled Therapeutic Interventions/Progress Updates:   Pt's brother and sister noted to be present in room w/ pt. Both expressed concern regarding pt's cognition and confirm that pt was independent prior to this hospital admission and most recent amputation. Provided skilled education regarding pt's CLOF and PT POC. Discussed goal of w/c level mobility at discharge 2/2 pt's pressure wound preventing ambulation w/ RLE prosthesis at this time. Discussed original goal of returning to Mod I as pt was independent prior to admission, however recent cognitive changes may prevent returning home or may require supervision at discharge. Discussed plan to continue working on functional mobility, endurance/strength, and cognition. Both verbalized understanding and will await discussion with other brother, POA-Derrick, who social worker has been communicating with. All appreciative of education, pt in care of family at end of education session, all needs met.   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   Therapy/Group: Individual Therapy  [name] 07/30/2017, 1:53 PM

## 2017-07-30 NOTE — Progress Notes (Addendum)
[facility] PHYSICAL MEDICINE & REHABILITATION     PROGRESS NOTE  Subjective/Complaints:   Pt is talking about pulling out an IV when he was giving himself a hairdcut.  OT notes confusion in therapy Poor intake sleeping yesterday  ROS: Denies CP, SOB, N/V/D.  Objective: Vital Signs: Blood pressure 127/78, pulse (!) 10, temperature 98.2 F (36.8 C), temperature source Oral, resp. rate 18, height 5\' 7"  (1.702 m), weight 113.9 kg (251 lb 1.7 oz), SpO2 99 %. No results found. Recent Labs    07/28/17 0715  WBC 15.5*  HGB 10.8*  HCT 33.7*  PLT 343   Recent Labs    07/28/17 0715 07/29/17 0846  NA 127* 127*  K 3.4* 3.8  CL 97* 98*  GLUCOSE 133* 160*  BUN 29* 29*  CREATININE 1.31* 1.40*  CALCIUM 8.9 9.3   CBG (last 3)  Recent Labs    07/29/17 1220 07/29/17 1702 07/29/17 2106  GLUCAP 109* 137* 123*    Wt Readings from Last 3 Encounters:  07/25/17 113.9 kg (251 lb 1.7 oz)  07/21/17 113.4 kg (250 lb)  07/21/17 113.8 kg (250 lb 12.8 oz)    Physical Exam:  BP 127/78 (BP Location: Left Arm)   Pulse (!) 10   Temp 98.2 F (36.8 C) (Oral)   Resp 18   Ht 5\' 7"  (1.702 m)   Wt 113.9 kg (251 lb 1.7 oz)   SpO2 99%   BMI 39.33 kg/m  General: NAD. Vitals stable. HENT: Normocephalic. Atraumatic Eyes: EOMI. No discharge Cardiac. Reg rate Regular rhythm.  No JVD. Respiratory. Clear to auscultation. Unlabored.  Abdomen: good bowel sounds. nondistended Neurological. Alert and oriented.  Followed full commands. Motor: B/l UE 5/5 proximal to distal LLE: HF 4/5 (stable) RLE: HF, KE 4+/5 Skin. Left BKA wwithout erythema or drainage, sutures intact Right BKA with small ulcer area at base of stump.Clean no odor or drainage Ext RUE edema, non tender  Assessment/Plan: 1. Functional deficits secondary to bilateral BKA which require 3+ hours per day of interdisciplinary therapy in a comprehensive inpatient rehab setting. Physiatrist is providing close team supervision and 24 hour  management of active medical problems listed below. Physiatrist and rehab team continue to assess barriers to discharge/monitor patient progress toward functional and medical goals.  Function:  Bathing Bathing position   Position: Sitting EOB  Bathing parts Body parts bathed by patient: Right arm, Left arm, Chest, Abdomen, Right upper leg, Left upper leg Body parts bathed by helper: Back  Bathing assist Assist Level: Touching or steadying assistance(Pt > 75%)      Upper Body Dressing/Undressing Upper body dressing   What is the patient wearing?: Pull over shirt/dress     Pull over shirt/dress - Perfomed by patient: Thread/unthread right sleeve, Thread/unthread left sleeve, Put head through opening, Pull shirt over trunk Pull over shirt/dress - Perfomed by helper: Pull shirt over trunk        Upper body assist Assist Level: Supervision or verbal cues      Lower Body Dressing/Undressing Lower body dressing   What is the patient wearing?: Pants     Pants- Performed by patient: Thread/unthread left pants leg Pants- Performed by helper: Thread/unthread right pants leg, Pull pants up/down                      Lower body assist Assist for lower body dressing: Touching or steadying assistance (Pt > 75%)      Toileting Toileting   Toileting  steps completed by patient: Performs perineal hygiene Toileting steps completed by helper: Adjust clothing prior to toileting, Adjust clothing after toileting(per Josem Kaufmann, NT)    Toileting assist     Transfers Chair/bed transfer   Chair/bed transfer method: Lateral scoot Chair/bed transfer assist level: Maximal assist (Pt 25 - 49%/lift and lower) Chair/bed transfer assistive device: Armrests, Bedrails     Locomotion Ambulation Ambulation activity did not occur: Safety/medical concerns         Wheelchair   Type: Manual Max wheelchair distance: 100' Assist Level: Supervision or verbal cues  Cognition Comprehension  Comprehension assist level: Understands basic 50 - 74% of the time/ requires cueing 25 - 49% of the time  Expression Expression assist level: Expresses basic 50 - 74% of the time/requires cueing 25 - 49% of the time. Needs to repeat parts of sentences.  Social Interaction Social Interaction assist level: Interacts appropriately 50 - 74% of the time - May be physically or verbally inappropriate.  Problem Solving Problem solving assist level: Solves basic 25 - 49% of the time - needs direction more than half the time to initiate, plan or complete simple activities  Memory Memory assist level: Recognizes or recalls 25 - 49% of the time/requires cueing 50 - 75% of the time    Medical Problem List and Plan: 1.  Decreased functional mobility secondary to left BKA 07/21/2017 as well as history of right BKA. Patient does use a right prosthesis   Cont CIR PT, OT 2.  DVT Prophylaxis/Anticoagulation: Subcutaneous Lovenox. Monitor for any bleeding episodes 3. Pain Management/chronic pain: Soma 350 mg 3 times a day, Neurontin 1200 mg 3 times a day, MS Contin 15 mg every 12 hours, Robaxin and oxycodone as needed 4. Mood: Effexor 75 mg twice a day Trazodone 25 mg daily at bedtime as needed 5. Neuropsych: This patient is capable of making decisions on his own behalf. 6. Skin/Wound Care: Routine skin checks 7. Fluids/Electrolytes/Nutrition: Routine I&O's poor fluid intake rising BUN and creatinine will start IV fluids 8. Acute blood loss anemia. Continue iron supplement.    Hb 11.7 on 1/1    Cont to monitor 9. Diabetes mellitus and peripheral neuropathy. Hemoglobin A1c 6.5. Currently with SSI. Check blood sugars before meals and at bedtime. Diabetic teaching. Patient on Glucophage 500 mg twice a day prior to admission. Resume as needed   Metformin 500 daily started on 1/2   Monitor with increased mobility CBG (last 3)  Recent Labs    07/29/17 1702 07/29/17 2106 07/30/17 0711  GLUCAP 137* 123* 120*    10. Hypertension. Norvasc 10 mg daily.   Improving on 1/2 11. Hyperlipidemia. Zoc2or 12. Constipation. Laxative assistance 13. Hyponatremia   Na 127 on 1/1   IV .9NS to start today 50ml /hr   Cont to monitor 14. Leukocytosis- BKA wounds look good   Afebrile   WBCs 15K 1/3   Cont to monitor 15.  RUE swelling check duplex doppler 16.  Confusion will d/c soma and change oxyIR to Q6 LOS (Days) 5 A FACE TO FACE EVALUATION WAS PERFORMED  Erick Colace 07/30/2017 7:10 AM

## 2017-07-30 NOTE — Progress Notes (Signed)
Occupational Therapy Session Note  Patient Details  Name: Justin Stone MRN: [id number] Date of Birth: January 05, 1961  Today's Date: 07/30/2017 OT Individual Time: 0705-0800 and 1131-1200 OT Individual Time Calculation (min): 55 min and 29 min    Short Term Goals: Week 1:  OT Short Term Goal 1 (Week 1): Pt will complete BSC transfer with S OT Short Term Goal 2 (Week 1): Pt will complete LB washing with S OT Short Term Goal 3 (Week 1): Pt will complete grooming at sink with S OT Short Term Goal 4 (Week 1): Pt will use w/c to gather clothes in room for dressing task with S   Skilled Therapeutic Interventions/Progress Updates:    Session 1: Upon entering the room, pt supine in bed with no c/o pain. Pt required total cues for orientation to location, situation, and time. Pt verbalized month as August, he did not know he was in the hospital, and he also did not know of new BKA. Pt performed supine >sit with mod A to EOB. Pt seated on EOB with breakfast tray for 33 minutes with close supervision - min A for balance. Pt requiring encouragement to eat meal and drink. Pt returning to supine at end of session with assistance for bed positioning. Bed alarm activated. Call bell and all needed items within reach upon exiting the room.   Session 2: Upon entering the room, pt supine in bed with sister in law present. She is very concerned due to pt's current level of confusion. Pt oriented to self only this session and giving therapist three different names before correctly naming family member. OT educated family member on pt's participation in therapy and POC. Pt performed 3 sets of 10 Bicep curls with use of orange, level 2 theraband for B UE strengthening. Pt able to attend to task and count each rep with mod cues for initiation. Pt remained in bed at end of session with bed alarm activated.   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); L BKA (NEW) General:   Vital Signs: Therapy Vitals Pulse Rate: (!) 106 BP: (!) 153/75 Pain:   ADL: ADL Eating: Modified independent Where Assessed-Eating: Wheelchair Grooming: Contact guard Where Assessed-Grooming: Wheelchair Upper Body Bathing: Minimal cueing, Contact guard Where Assessed-Upper Body Bathing: Wheelchair Lower Body Bathing: Minimal cueing, Contact guard Where Assessed-Lower Body Bathing: Bed level Upper Body Dressing: Minimal assistance Where Assessed-Upper Body Dressing: Wheelchair Lower Body Dressing: Minimal assistance Where Assessed-Lower Body Dressing: Bed level Toileting: Moderate assistance Where Assessed-Toileting: Bedside Commode Toilet Transfer: Moderate assistance Toilet Transfer Method: Scientist, research (life sciences)Transfer board Toilet Transfer Equipment: Drop arm bedside commode Tub/Shower Transfer: Not assessed Film/video editorWalk-In Shower Transfer: Not assessed ADL Comments: cues for sequencing and directions  See Function Navigator for Current Functional Status.   Therapy/Group: Individual Therapy  Alen BleacherBradsher, Shanda Cadotte P 07/30/2017, 12:32 PM

## 2017-07-30 NOTE — Progress Notes (Signed)
Physical Therapy Session Note  Patient Details  Name: Justin Stone MRN: [id number] Date of Birth: 1960-12-17  Today's Date: 07/30/2017 PT Individual Time: 0900-1000 PT Individual Time Calculation (min): 60 min   Short Term Goals: Week 1:  PT Short Term Goal 1 (Week 1): Pt will don and doff RLE BKA prosthesis w/ set-up assist while sitting at EOB or in w/c PT Short Term Goal 2 (Week 1): Pt will self-propel w/c 150' w/ supervision PT Short Term Goal 3 (Week 1): Pt will transfer bed<>chair via slide board transfer w/ supervision  PT Short Term Goal 4 (Week 1): Pt will perform sit<>stand transfer w/ Max assist using LRAD PT Short Term Goal 5 (Week 1): Pt will maintain dynamic sitting balance during functional activity w/ supervision w/o UE support  Skilled Therapeutic Interventions/Progress Updates:   Pt supine upon arrival and agreeable to therapy, no c/o pain. Attempted to orient pt to environment, A&Ox1, pt unable to state type of building he is in, city, or why he is in the hospital. Max cues to reorient multiple times throughout session. Transferred to EOB w/ mod assist and maintained static sitting balance w/ unilateral UE support on bed for 2-3 minutes while finishing breakfast. Worked on both lateral scoot and AP transfers this session. Transferred to w/c via lateral scoot on slide board w/ min guard and frequent verbal and visual cues for technique. Pt self-propelled w/c to/from gym w/ supervision using BUEs in 75-100' bouts. Attempted lateral scoot to edge of mat, even transfer, however pt unable to perform 2/2 delayed/decreased sequencing, initiation, and motor planning despite multimodal cues and maximal manual facilitation for technique. Pt noted to have soiled LE garments w/ foul smelling urine. Returned to room and performed AP transfer to EOB w/ mod assist. Halfway through transfer, provided set-up assist for pt to finish urinating. He required increased time for all mobility tasks 2/2  cognition deficits listed above. RN made aware of foul smelling urine and NT preparing to assist pt w/ pericare. Ended session in supine, call bell within reach and all needs met.   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   Therapy/Group: Individual Therapy  Tiffnay Bossi K Arnette 07/30/2017, 10:03 AM

## 2017-07-30 NOTE — Progress Notes (Signed)
Right upper extremity venous duplex has been completed. Negative for DVT.  07/30/17 4:01 PM Olen CordialGreg Zealand Boyett RVT

## 2017-07-30 NOTE — Progress Notes (Signed)
Speech Language Pathology Daily Session Note  Patient Details  Name: Justin PlateSheldon C Winkowski MRN: [id number] Date of Birth: 1960-11-17  Today's Date: 07/30/2017 SLP Individual Time: 1425-1500 SLP Individual Time Calculation (min): 35 min  Short Term Goals: Week 1: SLP Short Term Goal 1 (Week 1): Pt will recall basic, daily information with mod verbal cues for use of external aids.   SLP Short Term Goal 2 (Week 1): Pt will sustain his attention to basic, functional tasks for 5 minute intervals with mod verbal cues for redirection.   SLP Short Term Goal 3 (Week 1): Pt will complete basic, familiar tasks with mod assist for functional problem solving.   SLP Short Term Goal 4 (Week 1): Pt will recognize and correct errors when completing basic, familiar tasks with max assist.    Skilled Therapeutic Interventions: Skilled treatment session focused on cognitive goals. Patient missed initial 10 minutes of session due to toileting. SLP facilitated session by providing repetition and Max A visual cues for recall during a basic money management task but was Mod I for problem solving. Patient was confabulatory throughout session but it appeared directly related to environment and required Max A verbal cues for sustained attention to tasks for ~2 minute intervals. Patient left upright in bed with alarm on and all needs within reach. Continue with current plan of care.       Function:  Eating Eating   Modified Consistency Diet: No Eating Assist Level: More than reasonable amount of time;Set up assist for   Eating Set Up Assist For: Opening containers;Cutting food       Cognition Comprehension Comprehension assist level: Understands basic 50 - 74% of the time/ requires cueing 25 - 49% of the time  Expression   Expression assist level: Expresses basic 50 - 74% of the time/requires cueing 25 - 49% of the time. Needs to repeat parts of sentences.  Social Interaction Social Interaction assist level: Interacts  appropriately 50 - 74% of the time - May be physically or verbally inappropriate.  Problem Solving Problem solving assist level: Solves basic 25 - 49% of the time - needs direction more than half the time to initiate, plan or complete simple activities  Memory Memory assist level: Recognizes or recalls 25 - 49% of the time/requires cueing 50 - 75% of the time    Pain No/Denies Pain   Therapy/Group: Individual Therapy  Tilden Broz 07/30/2017, 3:06 PM

## 2017-07-31 ENCOUNTER — Inpatient Hospital Stay (HOSPITAL_COMMUNITY): Payer: Medicare Other

## 2017-07-31 LAB — CBC WITH DIFFERENTIAL/PLATELET
Basophils Absolute: 0.1 10*3/uL (ref 0.0–0.1)
Basophils Relative: 1 %
Eosinophils Absolute: 0.3 10*3/uL (ref 0.0–0.7)
Eosinophils Relative: 3 %
HEMATOCRIT: 34.9 % — AB (ref 39.0–52.0)
Hemoglobin: 11.2 g/dL — ABNORMAL LOW (ref 13.0–17.0)
LYMPHS PCT: 16 %
Lymphs Abs: 1.9 10*3/uL (ref 0.7–4.0)
MCH: 31.2 pg (ref 26.0–34.0)
MCHC: 32.1 g/dL (ref 30.0–36.0)
MCV: 97.2 fL (ref 78.0–100.0)
MONO ABS: 1 10*3/uL (ref 0.1–1.0)
MONOS PCT: 8 %
NEUTROS ABS: 8.4 10*3/uL — AB (ref 1.7–7.7)
Neutrophils Relative %: 72 %
Platelets: 317 10*3/uL (ref 150–400)
RBC: 3.59 MIL/uL — ABNORMAL LOW (ref 4.22–5.81)
RDW: 15.2 % (ref 11.5–15.5)
WBC: 11.7 10*3/uL — ABNORMAL HIGH (ref 4.0–10.5)

## 2017-07-31 LAB — BASIC METABOLIC PANEL
Anion gap: 13 (ref 5–15)
BUN: 27 mg/dL — ABNORMAL HIGH (ref 6–20)
CALCIUM: 9.5 mg/dL (ref 8.9–10.3)
CO2: 16 mmol/L — AB (ref 22–32)
CREATININE: 1.25 mg/dL — AB (ref 0.61–1.24)
Chloride: 98 mmol/L — ABNORMAL LOW (ref 101–111)
GFR calc Af Amer: >60 mL/min (ref >60)
GFR calc non Af Amer: >60 mL/min (ref >60)
Glucose, Bld: 135 mg/dL — ABNORMAL HIGH (ref 65–99)
Potassium: 3.7 mmol/L (ref 3.5–5.1)
Sodium: 127 mmol/L — ABNORMAL LOW (ref 135–145)

## 2017-07-31 LAB — GLUCOSE, CAPILLARY
GLUCOSE-CAPILLARY: 212 mg/dL — AB (ref 65–99)
GLUCOSE-CAPILLARY: 102 mg/dL — AB (ref 65–99)
Glucose-Capillary: 119 mg/dL — ABNORMAL HIGH (ref 65–99)
Glucose-Capillary: 152 mg/dL — ABNORMAL HIGH (ref 65–99)

## 2017-07-31 LAB — URINE CULTURE: Culture: NO GROWTH

## 2017-07-31 MED ORDER — GABAPENTIN 300 MG PO CAPS
600.0000 mg | ORAL_CAPSULE | Freq: Three times a day (TID) | ORAL | Status: DC
  Administered 2017-07-31 – 2017-08-02 (×8): 600 mg via ORAL
  Filled 2017-07-31 (×10): qty 2

## 2017-07-31 MED ORDER — SODIUM CHLORIDE 0.9 % IV SOLN
INTRAVENOUS | Status: DC
  Administered 2017-07-31 – 2017-08-12 (×5): via INTRAVENOUS

## 2017-07-31 NOTE — Progress Notes (Signed)
Distended and tight  abdomen ; stools loose and watery. MD notified. Order noted. Continued to monitor.

## 2017-07-31 NOTE — Plan of Care (Signed)
  RH KNOWLEDGE DEFICIT LIMB LOSS RH STG INCREASE KNOWLEDGE OF SELF CARE AFTER LIMB LOSS Description Patient will verbalize understanding of limb loss education materials as presented by staff with min assist. Patient confused at this time; MD aware medications adjusted 07/31/2017 1349 - Not Progressing by Pierre BaliLeonar, Raiya Stainback G, RN

## 2017-07-31 NOTE — Progress Notes (Signed)
[facility] PHYSICAL MEDICINE & REHABILITATION     PROGRESS NOTE  Subjective/Complaints:   Discussed UE doppler and reviewed lab result  ROS: Denies CP, SOB, N/V/D.  Objective: Vital Signs: Blood pressure (!) 156/74, pulse (!) 104, temperature 98.9 F (37.2 C), temperature source Oral, resp. rate 20, height 5\' 7"  (1.702 m), weight 113.9 kg (251 lb 1.7 oz), SpO2 99 %. No results found. Recent Labs    07/31/17 0530  WBC 11.7*  HGB 11.2*  HCT 34.9*  PLT 317   Recent Labs    07/29/17 0846 07/31/17 0530  NA 127* 127*  K 3.8 3.7  CL 98* 98*  GLUCOSE 160* 135*  BUN 29* 27*  CREATININE 1.40* 1.25*  CALCIUM 9.3 9.5   CBG (last 3)  Recent Labs    07/30/17 1701 07/30/17 2057 07/31/17 0612  GLUCAP 134* 200* 212*    Wt Readings from Last 3 Encounters:  07/25/17 113.9 kg (251 lb 1.7 oz)  07/21/17 113.4 kg (250 lb)  07/21/17 113.8 kg (250 lb 12.8 oz)    Physical Exam:  BP (!) 156/74 (BP Location: Left Arm) Comment: RN notifed  Pulse (!) 104 Comment: RN notified  Temp 98.9 F (37.2 C) (Oral)   Resp 20   Ht 5\' 7"  (1.702 m)   Wt 113.9 kg (251 lb 1.7 oz)   SpO2 99%   BMI 39.33 kg/m  General: NAD. Vitals stable. HENT: Normocephalic. Atraumatic Eyes: EOMI. No discharge Cardiac. Reg rate Regular rhythm.  No JVD. Respiratory. Clear to auscultation. Unlabored.  Abdomen: good bowel sounds. nondistended Neurological. Alert oriented to person month and year but not place Followed full commands. Motor: B/l UE 5/5 proximal to distal LLE: HF 4/5 (stable) RLE: HF, KE 4+/5 Skin. Left BKA without erythema or drainage, sutures intact, mild distal edema Right BKA with small ulcer area at base of stump.Clean no odor or drainage Ext RUE edema, non tender  Assessment/Plan: 1. Functional deficits secondary to bilateral BKA which require 3+ hours per day of interdisciplinary therapy in a comprehensive inpatient rehab setting. Physiatrist is providing close team supervision and 24  hour management of active medical problems listed below. Physiatrist and rehab team continue to assess barriers to discharge/monitor patient progress toward functional and medical goals.  Function:  Bathing Bathing position   Position: Sitting EOB  Bathing parts Body parts bathed by patient: Right arm, Left arm, Chest, Abdomen, Right upper leg, Left upper leg Body parts bathed by helper: Back  Bathing assist Assist Level: Touching or steadying assistance(Pt > 75%)      Upper Body Dressing/Undressing Upper body dressing   What is the patient wearing?: Pull over shirt/dress     Pull over shirt/dress - Perfomed by patient: Thread/unthread right sleeve, Thread/unthread left sleeve, Put head through opening, Pull shirt over trunk Pull over shirt/dress - Perfomed by helper: Pull shirt over trunk        Upper body assist Assist Level: Supervision or verbal cues      Lower Body Dressing/Undressing Lower body dressing   What is the patient wearing?: Pants     Pants- Performed by patient: Thread/unthread left pants leg Pants- Performed by helper: Thread/unthread right pants leg, Pull pants up/down                      Lower body assist Assist for lower body dressing: Touching or steadying assistance (Pt > 75%)      Toileting Toileting   Toileting steps completed by  patient: Performs perineal hygiene Toileting steps completed by helper: Adjust clothing prior to toileting, Adjust clothing after toileting(per Josem Kaufmann, NT)    Toileting assist     Transfers Chair/bed transfer   Chair/bed transfer method: Anterior/posterior, Lateral scoot Chair/bed transfer assist level: Maximal assist (Pt 25 - 49%/lift and lower) Chair/bed transfer assistive device: Armrests, Bedrails     Locomotion Ambulation Ambulation activity did not occur: Safety/medical concerns         Wheelchair   Type: Manual Max wheelchair distance: 100' Assist Level: Supervision or verbal cues   Cognition Comprehension Comprehension assist level: Understands basic 50 - 74% of the time/ requires cueing 25 - 49% of the time  Expression Expression assist level: Expresses basic 50 - 74% of the time/requires cueing 25 - 49% of the time. Needs to repeat parts of sentences.  Social Interaction Social Interaction assist level: Interacts appropriately 50 - 74% of the time - May be physically or verbally inappropriate.  Problem Solving Problem solving assist level: Solves basic 25 - 49% of the time - needs direction more than half the time to initiate, plan or complete simple activities  Memory Memory assist level: Recognizes or recalls 25 - 49% of the time/requires cueing 50 - 75% of the time    Medical Problem List and Plan: 1.  Decreased functional mobility secondary to left BKA 07/21/2017 as well as history of right BKA. Patient does use a right prosthesis   Cont CIR PT, OT 2.  DVT Prophylaxis/Anticoagulation: Subcutaneous Lovenox. Monitor for any bleeding episodes 3. Pain Management/chronic pain: Soma 350 mg 3 times a day, Neurontin 1200 mg 3 times a day, MS Contin 15 mg every 12 hours, Robaxin and oxycodone as needed 4. Mood: Effexor 75 mg twice a day Trazodone 25 mg daily at bedtime as needed 5. Neuropsych: This patient is capable of making decisions on his own behalf. 6. Skin/Wound Care: Routine skin checks 7. Fluids/Electrolytes/Nutrition: Routine I&O's poor fluid intake rising BUN and creatinine will start IV fluids 8. Acute blood loss anemia. Continue iron supplement.    Hb 11.2 on 1/5    Cont to monitor 9. Diabetes mellitus and peripheral neuropathy. Hemoglobin A1c 6.5. Currently with SSI. Check blood sugars before meals and at bedtime. Diabetic teaching. Patient on Glucophage 500 mg twice a day prior to admission. Resume as needed   Metformin 500 daily started on 1/2   Monitor with increased mobility CBG (last 3)  Recent Labs    07/30/17 1701 07/30/17 2057 07/31/17 0612   GLUCAP 134* 200* 212*   10. Hypertension. Norvasc 10 mg daily.   Improving on 1/2 11. Hyperlipidemia. Zoc2or 12. Constipation. Laxative assistance 13. Hyponatremia- poor oral intake   Na 127 on 1/1   IV .9NS cont 50ml /hr   Cont to monitor 14. Leukocytosis- improving, afebrile   Cont to monitor 15.  RUE swelling negative duplex doppler 1/5 16.  Confusion will d/c soma and change oxyIR to Q6- per RN did not get Oxy IR yesterday.  Takes very large dose of Gabapentin will reduce to 600mg  TID LOS (Days) 6 A FACE TO FACE EVALUATION WAS PERFORMED  Erick Colace 07/31/2017 7:45 AM

## 2017-08-01 ENCOUNTER — Inpatient Hospital Stay (HOSPITAL_COMMUNITY): Payer: Medicare Other | Admitting: Occupational Therapy

## 2017-08-01 ENCOUNTER — Encounter (HOSPITAL_COMMUNITY): Payer: Medicare Other | Admitting: Psychology

## 2017-08-01 ENCOUNTER — Inpatient Hospital Stay (HOSPITAL_COMMUNITY): Payer: Medicare Other

## 2017-08-01 LAB — GLUCOSE, CAPILLARY
GLUCOSE-CAPILLARY: 123 mg/dL — AB (ref 65–99)
Glucose-Capillary: 118 mg/dL — ABNORMAL HIGH (ref 65–99)
Glucose-Capillary: 150 mg/dL — ABNORMAL HIGH (ref 65–99)
Glucose-Capillary: 189 mg/dL — ABNORMAL HIGH (ref 65–99)

## 2017-08-01 LAB — CREATININE, SERUM
Creatinine, Ser: 1.15 mg/dL (ref 0.61–1.24)
GFR calc Af Amer: >60 mL/min (ref >60)

## 2017-08-01 MED ORDER — TRAMADOL HCL 50 MG PO TABS
50.0000 mg | ORAL_TABLET | Freq: Four times a day (QID) | ORAL | Status: DC | PRN
  Administered 2017-08-02 – 2017-08-15 (×8): 50 mg via ORAL
  Filled 2017-08-01 (×8): qty 1

## 2017-08-01 MED ORDER — SENNOSIDES-DOCUSATE SODIUM 8.6-50 MG PO TABS
2.0000 | ORAL_TABLET | Freq: Two times a day (BID) | ORAL | Status: DC
  Administered 2017-08-01 – 2017-08-04 (×2): 2 via ORAL
  Filled 2017-08-01 (×6): qty 2

## 2017-08-01 NOTE — Progress Notes (Signed)
[facility] PHYSICAL MEDICINE & REHABILITATION     PROGRESS NOTE  Subjective/Complaints:  Pt seeing "bug  On the wall"  I pointed to a screw and pt states that was it No vomiting , loose watery stool x 3   ROS: Denies CP, SOB, N/V/D.  Objective: Vital Signs: Blood pressure (!) 164/70, pulse (!) 103, temperature 98.4 F (36.9 C), temperature source Oral, resp. rate 16, height 5\' 7"  (1.702 m), weight 113.9 kg (251 lb 1.7 oz), SpO2 99 %. Dg Abd Portable 1v  Result Date: 08/01/2017 CLINICAL DATA:  Abdominal distention EXAM: PORTABLE ABDOMEN - 1 VIEW COMPARISON:  None. FINDINGS: Moderate gaseous distention of small and large bowel, question ileus. No free air, organomegaly or suspicious calculi. dextroconvex curvature of the lower thoracic spine. IMPRESSION: 1. Diffuse moderate gaseous distention of small and large bowel, question ileus. 2. No free air, organomegaly or radiopaque calculi. 3. Dextroscoliosis of the dorsal spine. Electronically Signed   By: Tollie Ethavid  Kwon M.D.   On: 08/01/2017 02:46   Recent Labs    07/31/17 0530  WBC 11.7*  HGB 11.2*  HCT 34.9*  PLT 317   Recent Labs    07/29/17 0846 07/31/17 0530  NA 127* 127*  K 3.8 3.7  CL 98* 98*  GLUCOSE 160* 135*  BUN 29* 27*  CREATININE 1.40* 1.25*  CALCIUM 9.3 9.5   CBG (last 3)  Recent Labs    07/31/17 1627 07/31/17 2058 08/01/17 0627  GLUCAP 119* 152* 118*    Wt Readings from Last 3 Encounters:  07/25/17 113.9 kg (251 lb 1.7 oz)  07/21/17 113.4 kg (250 lb)  07/21/17 113.8 kg (250 lb 12.8 oz)    Physical Exam:  BP (!) 164/70 (BP Location: Right Arm)   Pulse (!) 103   Temp 98.4 F (36.9 C) (Oral)   Resp 16   Ht 5\' 7"  (1.702 m)   Wt 113.9 kg (251 lb 1.7 oz)   SpO2 99%   BMI 39.33 kg/m  General: NAD. Vitals stable. HENT: Normocephalic. Atraumatic Eyes: EOMI. No discharge Cardiac. Reg rate Regular rhythm.  No JVD. Respiratory. Clear to auscultation. Unlabored.  Abdomen: good bowel sounds.  nondistended Neurological. Alert oriented to person not month and year but not place Followed full commands. Motor: B/l UE 5/5 proximal to distal LLE: HF 4/5 (stable) RLE: HF, KE 4+/5 Skin. Left BKA without erythema or drainage, sutures intact, mild distal edema Right BKA with small ulcer area at base of stump.Clean no odor or drainage Ext RUE edema, non tender  Assessment/Plan: 1. Functional deficits secondary to bilateral BKA which require 3+ hours per day of interdisciplinary therapy in a comprehensive inpatient rehab setting. Physiatrist is providing close team supervision and 24 hour management of active medical problems listed below. Physiatrist and rehab team continue to assess barriers to discharge/monitor patient progress toward functional and medical goals.  Function:  Bathing Bathing position   Position: Sitting EOB  Bathing parts Body parts bathed by patient: Right arm, Left arm, Chest, Abdomen, Right upper leg, Left upper leg Body parts bathed by helper: Back  Bathing assist Assist Level: Touching or steadying assistance(Pt > 75%)      Upper Body Dressing/Undressing Upper body dressing   What is the patient wearing?: Pull over shirt/dress     Pull over shirt/dress - Perfomed by patient: Thread/unthread right sleeve, Thread/unthread left sleeve, Put head through opening, Pull shirt over trunk Pull over shirt/dress - Perfomed by helper: Pull shirt over trunk  Upper body assist Assist Level: Supervision or verbal cues      Lower Body Dressing/Undressing Lower body dressing   What is the patient wearing?: Pants     Pants- Performed by patient: Thread/unthread left pants leg Pants- Performed by helper: Thread/unthread right pants leg, Pull pants up/down                      Lower body assist Assist for lower body dressing: Touching or steadying assistance (Pt > 75%)      Toileting Toileting   Toileting steps completed by patient: Performs  perineal hygiene Toileting steps completed by helper: Adjust clothing prior to toileting, Adjust clothing after toileting(per Josem Kaufmann, NT)    Toileting assist     Transfers Chair/bed transfer   Chair/bed transfer method: Anterior/posterior, Lateral scoot Chair/bed transfer assist level: Maximal assist (Pt 25 - 49%/lift and lower) Chair/bed transfer assistive device: Armrests, Bedrails     Locomotion Ambulation Ambulation activity did not occur: Safety/medical concerns         Wheelchair   Type: Manual Max wheelchair distance: 100' Assist Level: Supervision or verbal cues  Cognition Comprehension Comprehension assist level: Understands basic 50 - 74% of the time/ requires cueing 25 - 49% of the time  Expression Expression assist level: Expresses basic 25 - 49% of the time/requires cueing 50 - 75% of the time. Uses single words/gestures.  Social Interaction Social Interaction assist level: Interacts appropriately 50 - 74% of the time - May be physically or verbally inappropriate.  Problem Solving Problem solving assist level: Solves basic less than 25% of the time - needs direction nearly all the time or does not effectively solve problems and may need a restraint for safety  Memory Memory assist level: Recognizes or recalls 25 - 49% of the time/requires cueing 50 - 75% of the time    Medical Problem List and Plan: 1.  Decreased functional mobility secondary to left BKA 07/21/2017 as well as history of right BKA. Patient does use a right prosthesis   Cont CIR PT, OT 2.  DVT Prophylaxis/Anticoagulation: Subcutaneous Lovenox. Monitor for any bleeding episodes 3. Pain Management/chronic pain: Soma 350 mg 3 times a day, Neurontin 1200 mg 3 times a day, MS Contin 15 mg every 12 hours, Robaxin and oxycodone as needed 4. Mood: Effexor 75 mg twice a day Trazodone 25 mg daily at bedtime as needed 5. Neuropsych: This patient is capable of making decisions on his own behalf. 6.  Skin/Wound Care: Routine skin checks 7. Fluids/Electrolytes/Nutrition: Routine I&O's poor fluid intake rising BUN and creatinine will start IV fluids 8. Acute blood loss anemia. Continue iron supplement.    Hb 11.2 on 1/5    Cont to monitor 9. Diabetes mellitus and peripheral neuropathy. Hemoglobin A1c 6.5. Currently with SSI. Check blood sugars before meals and at bedtime. Diabetic teaching. Patient on Glucophage 500 mg twice a day prior to admission. Resume as needed   Metformin 500 daily started on 1/2-will hold due to watery stool   Monitor with increased mobility CBG (last 3)  Recent Labs    07/31/17 1627 07/31/17 2058 08/01/17 0627  GLUCAP 119* 152* 118*  CBG controlled 1/7 10. Hypertension. Norvasc 10 mg daily.   Improving on 1/2 11. Hyperlipidemia. Zoc2or 12. Constipation. Laxative assistance 13. Hyponatremia- poor oral intake   Na 127 on 1/1   IV .9NS cont 50ml /hr   Cont to monitor 14. Leukocytosis- improving, afebrile   Cont to monitor 15.  RUE swelling negative duplex doppler 1/5 16.  Confusion will d/c soma and change oxyIR to Q6- per RN did not get Oxy IR yesterday.  Takes very large dose of Gabapentin will reduce to 600mg  TID 17.  Loose watery stools, clinically appears to have partial ileus with KUB demonstrating  Large and small bowel gas distension, give fleets today Change MS Contin to tramadol  Pt also recently started on metformin will hold for now  LOS (Days) 7 A FACE TO FACE EVALUATION WAS PERFORMED  Erick Colace 08/01/2017 7:22 AM

## 2017-08-01 NOTE — Progress Notes (Signed)
Patient refused to allow IV team to restart IV. Also refusing lipitor and iron for 1800 med pass. Riley LamEunice, NP notified. Will attempt re-start IV after patient has calmed down. Will continue to monitor and report to oncoming staff.

## 2017-08-01 NOTE — Progress Notes (Addendum)
Physical Therapy Note  Patient Details  Name: Justin PlateSheldon C Stone MRN: [id number] Date of Birth: 1961-04-29 Today's Date: 08/01/2017  1305-1405, 60 min individual tx  Seated in tilt in space w/c, pt performed 10 x 2 R/L short arc quad knee ext, bil hip adduction, bil hip abduction against manual resistance, bil glut sets, trunk flexion.  Lunch tray arrived; with max cues and encouragement, pt used bil hands for container mgt and use of utensils  to feed himself.  +2 f or Maximove transfer back to bed. Pt left resting in bed with alarm set and all needs at hand.  Pt's cousin present.  PT asked him to bring pics and personal items from home to help with pt's oreintation.  See function navigator for current status.  Tahiri Shareef 08/01/2017, 1:47 PM

## 2017-08-01 NOTE — Progress Notes (Signed)
Speech Language Pathology Daily Session Note  Patient Details  Name: Justin Stone MRN: [id number] Date of Birth: 09-May-1961  Today's Date: 08/01/2017 SLP Individual Time: [id number] SLP Individual Time Calculation (min): 30 min  Short Term Goals: Week 1: SLP Short Term Goal 1 (Week 1): Pt will recall basic, daily information with mod verbal cues for use of external aids.   SLP Short Term Goal 2 (Week 1): Pt will sustain his attention to basic, functional tasks for 5 minute intervals with mod verbal cues for redirection.   SLP Short Term Goal 3 (Week 1): Pt will complete basic, familiar tasks with mod assist for functional problem solving.   SLP Short Term Goal 4 (Week 1): Pt will recognize and correct errors when completing basic, familiar tasks with max assist.    Skilled Therapeutic Interventions: Skilled treatment session focused on cognition goals. Despite repetitive Total A multimodal cues (visual, verbal and tactile) pt was not able to comprehend or express any orientation information besides his name. Even while looking at left leg, he was not able to verbalize or comprehend that his leg had been amputated, even with continuous Total A cues. Pt then attempted to talk on telephone without anyone being on the telephone. Pt was continuously distracted by phone cord even when replaced out of sight by SLP. Pt also been visualizing objects lighting up on the side of his bed. Nursing informed of pt's significant level of confusion.      Function:    Cognition Comprehension Comprehension assist level: Understands basic less than 25% of the time/ requires cueing >75% of the time  Expression   Expression assist level: Expresses basis less than 25% of the time/requires cueing >75% of the time.  Social Interaction Social Interaction assist level: Interacts appropriately less than 25% of the time. May be withdrawn or combative.  Problem Solving Problem solving assist level: Solves basic less  than 25% of the time - needs direction nearly all the time or does not effectively solve problems and may need a restraint for safety  Memory Memory assist level: Recognizes or recalls less than 25% of the time/requires cueing greater than 75% of the time    Pain    Therapy/Group: Individual Therapy  Milburn Freeney 08/01/2017, 3:42 PM

## 2017-08-01 NOTE — Progress Notes (Signed)
Physical Therapy Session Note  Patient Details  Name: Justin PlateSheldon C Swindell MRN: [id number] Date of Birth: 07-07-1961  Today's Date: 08/01/2017 PT Individual Time: 1015-1130 PT Individual Time Calculation (min): 75 min   Short Term Goals: Week 1:  PT Short Term Goal 1 (Week 1): Pt will don and doff RLE BKA prosthesis w/ set-up assist while sitting at EOB or in w/c PT Short Term Goal 2 (Week 1): Pt will self-propel w/c 150' w/ supervision PT Short Term Goal 3 (Week 1): Pt will transfer bed<>chair via slide board transfer w/ supervision  PT Short Term Goal 4 (Week 1): Pt will perform sit<>stand transfer w/ Max assist using LRAD PT Short Term Goal 5 (Week 1): Pt will maintain dynamic sitting balance during functional activity w/ supervision w/o UE support  Skilled Therapeutic Interventions/Progress Updates:    Session focused on cognitive remediation and reorientation as able with minimal success and engaging in functional mobility. Max to total cues for technique for donning of pants or for pt to be aware that pt did not have pants on in preparation for OOB. Pt with no intellectual awareness into deficits and impaired ability to problem solve various attempts in bed mobility. Mod assist at one point to come to seated position but just lets himself fall backwards and unable to self correct or demonstrate any awareness to positioning in the bed. Pt very internally distracted during session and unable to maintain sustained attention to any functional task total assist for use of urinal and problem solving technique as pt attempting to pee in supine. Despite various and multiple attempts, determined for safety to use maxi move for transfer to w/c and utilized TIS w/c for safety due to pt's impaired awareness (pt unaware that he did not have feet or that the had bilateral amputations). Seat belt donned and RN made aware of changes to safety plan.    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   See Function Navigator for Current Functional Status.   Therapy/Group: Individual Therapy  [name], Tadan Shill Darrol PokeBrescia  Kaloni Bisaillon B. Laurana Magistro, PT, DPT  08/01/2017, 12:11 PM

## 2017-08-01 NOTE — Progress Notes (Signed)
Occupational Therapy Session Note  Patient Details  Name: Rolla PlateSheldon C Hazelrigg MRN: [id number] Date of Birth: [birth year]  Today's Date: 08/01/2017 OT Individual Time: [id number] OT Individual Time Calculation (min): 42 min    Short Term Goals: Week 1:  OT Short Term Goal 1 (Week 1): Pt will complete BSC transfer with S OT Short Term Goal 2 (Week 1): Pt will complete LB washing with S OT Short Term Goal 3 (Week 1): Pt will complete grooming at sink with S OT Short Term Goal 4 (Week 1): Pt will use w/c to gather clothes in room for dressing task with S   Skilled Therapeutic Interventions/Progress Updates:    Pt extremely confused during session.  No orientation of time, place, or situation.  When told he had his leg amputated pt report that he hadn't.  Therapist had him raise his left leg so he could see and then he was extremely surprised stating. "Now when did that happen?". Pt already in bed at start of session so had him transition to the EOB to work on [employer] selfcare tasks.  He was able to maintain static sitting balance EOB with min guard assist while completing washing of his UB, with therapist assisting with washing his back.  Donned hospital gown secondary to pt running IV fluids at this time.  Did not attempt washing LEs or peri area secondary to time restrictions.  Pt left in bed with bed alarm in place and SLP coming in for next session.   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   Therapy/Group: Individual Therapy  Clayten Allcock OTR/L 08/01/2017,  4:12 PM

## 2017-08-01 NOTE — Progress Notes (Signed)
Social Work Patient ID: [name], male   DOB: November 08, 1960, 57 y.o.   MRN: [id number]  Spoke with Jonny RuizJohn Derrick-pt's brother who lives in [place]. He reports pt has not taken care of himself for years and has been taking pain meds, not taking his diabetes meds and drinking alcohol on top of this. He feels he has been depressed for years and not recently has found out hs Mom there has been diagnosed with stage 4 cancer and is in a hospital [place]. He feels he is different and was not functioning like this at home, but also was not taking care of himself either. He only has a few friends here and his family is out of state. He was not getting along with Leonard-his brother so he moved to [place] with his girlfriend. Discussed pt would need care at discharge from here and Shawnie DapperJohn Derrick voiced he will need to go to a NH to get more therapy and then home if he can. Will keep updated and see what team feels at team conference, brother does plan to come here after visiting his Mom in [place] could not specify when.

## 2017-08-02 ENCOUNTER — Inpatient Hospital Stay (HOSPITAL_COMMUNITY): Payer: Medicare Other | Admitting: Speech Pathology

## 2017-08-02 ENCOUNTER — Inpatient Hospital Stay (HOSPITAL_COMMUNITY): Payer: Medicare Other | Admitting: Occupational Therapy

## 2017-08-02 ENCOUNTER — Inpatient Hospital Stay (HOSPITAL_COMMUNITY): Payer: Medicare Other | Admitting: Physical Therapy

## 2017-08-02 DIAGNOSIS — R195 Other fecal abnormalities: Secondary | ICD-10-CM

## 2017-08-02 LAB — GLUCOSE, CAPILLARY
GLUCOSE-CAPILLARY: 136 mg/dL — AB (ref 65–99)
GLUCOSE-CAPILLARY: 153 mg/dL — AB (ref 65–99)
Glucose-Capillary: 146 mg/dL — ABNORMAL HIGH (ref 65–99)
Glucose-Capillary: 128 mg/dL — ABNORMAL HIGH (ref 65–99)

## 2017-08-02 NOTE — Progress Notes (Signed)
[facility] PHYSICAL MEDICINE & REHABILITATION     PROGRESS NOTE  Subjective/Complaints:  Pt seen lying in bed this AM.  He states she slept well overnight.  He remains confused.   ROS: Denies CP, SOB, N/V/D.  Objective: Vital Signs: Blood pressure (!) 147/66, pulse 99, temperature 98.3 F (36.8 C), temperature source Oral, resp. rate 18, height 5\' 7"  (1.702 m), weight 113.9 kg (251 lb 1.7 oz), SpO2 99 %. Dg Abd Portable 1v  Result Date: 08/01/2017 CLINICAL DATA:  Abdominal distention EXAM: PORTABLE ABDOMEN - 1 VIEW COMPARISON:  None. FINDINGS: Moderate gaseous distention of small and large bowel, question ileus. No free air, organomegaly or suspicious calculi. dextroconvex curvature of the lower thoracic spine. IMPRESSION: 1. Diffuse moderate gaseous distention of small and large bowel, question ileus. 2. No free air, organomegaly or radiopaque calculi. 3. Dextroscoliosis of the dorsal spine. Electronically Signed   By: Tollie Eth M.D.   On: 08/01/2017 02:46   Recent Labs    07/31/17 0530  WBC 11.7*  HGB 11.2*  HCT 34.9*  PLT 317   Recent Labs    07/31/17 0530 08/01/17 0605  NA 127*  --   K 3.7  --   CL 98*  --   GLUCOSE 135*  --   BUN 27*  --   CREATININE 1.25* 1.15  CALCIUM 9.5  --    CBG (last 3)  Recent Labs    08/01/17 1629 08/01/17 2145 08/02/17 0619  GLUCAP 123* 189* 136*    Wt Readings from Last 3 Encounters:  07/25/17 113.9 kg (251 lb 1.7 oz)  07/21/17 113.4 kg (250 lb)  07/21/17 113.8 kg (250 lb 12.8 oz)    Physical Exam:  BP (!) 147/66 (BP Location: Left Arm)   Pulse 99   Temp 98.3 F (36.8 C) (Oral)   Resp 18   Ht 5\' 7"  (1.702 m)   Wt 113.9 kg (251 lb 1.7 oz)   SpO2 99%   BMI 39.33 kg/m  General: NAD. Vitals stable. HENT: Normocephalic. Atraumatic Eyes: EOMI. No discharge Cardiac. RRR.  No JVD. Respiratory. Clear to auscultation. Unlabored.  Abdomen: good bowel sounds. nondistended Musculoskeletal: Bilateral BKA Neurological. Alert and  oriented to person only  Followed full commands. Motor: B/l UE 5/5 proximal to distal LLE: HF 4-/5 RLE: HF, KE 4+/5 Skin. Left BKA c/d/i with dusky appearance Right BKA with small ulcer area at base of stump.  Assessment/Plan: 1. Functional deficits secondary to bilateral BKA which require 3+ hours per day of interdisciplinary therapy in a comprehensive inpatient rehab setting. Physiatrist is providing close team supervision and 24 hour management of active medical problems listed below. Physiatrist and rehab team continue to assess barriers to discharge/monitor patient progress toward functional and medical goals.  Function:  Bathing Bathing position   Position: Sitting EOB  Bathing parts Body parts bathed by patient: Right arm, Left arm, Chest, Abdomen Body parts bathed by helper: Back  Bathing assist Assist Level: Touching or steadying assistance(Pt > 75%)      Upper Body Dressing/Undressing Upper body dressing   What is the patient wearing?: Pull over shirt/dress     Pull over shirt/dress - Perfomed by patient: Thread/unthread right sleeve, Thread/unthread left sleeve, Put head through opening, Pull shirt over trunk Pull over shirt/dress - Perfomed by helper: Pull shirt over trunk        Upper body assist Assist Level: Supervision or verbal cues      Lower Body Dressing/Undressing Lower body dressing  What is the patient wearing?: Pants     Pants- Performed by patient: Thread/unthread left pants leg Pants- Performed by helper: Thread/unthread right pants leg, Pull pants up/down                      Lower body assist Assist for lower body dressing: Touching or steadying assistance (Pt > 75%)      Toileting Toileting   Toileting steps completed by patient: Performs perineal hygiene Toileting steps completed by helper: Adjust clothing prior to toileting, Performs perineal hygiene, Adjust clothing after toileting Toileting Assistive Devices: Grab bar or  rail  Toileting assist Assist level: Touching or steadying assistance (Pt.75%), More than reasonable time   Transfers Chair/bed transfer   Chair/bed transfer method: Other Chair/bed transfer assist level: 2 helpers Chair/bed transfer assistive device: Mechanical lift Mechanical lift: Maximove   Locomotion Ambulation Ambulation activity did not occur: Safety/medical concerns         Wheelchair   Type: Manual Max wheelchair distance: 100' Assist Level: Supervision or verbal cues  Cognition Comprehension Comprehension assist level: Understands basic less than 25% of the time/ requires cueing >75% of the time  Expression Expression assist level: Expresses basic 25 - 49% of the time/requires cueing 50 - 75% of the time. Uses single words/gestures.  Social Interaction Social Interaction assist level: Interacts appropriately less than 25% of the time. May be withdrawn or combative.  Problem Solving Problem solving assist level: Solves basic less than 25% of the time - needs direction nearly all the time or does not effectively solve problems and may need a restraint for safety  Memory Memory assist level: Recognizes or recalls less than 25% of the time/requires cueing greater than 75% of the time    Medical Problem List and Plan: 1.  Decreased functional mobility secondary to left BKA 07/21/2017 as well as history of right BKA. Patient does use a right prosthesis   Cont CIR    Notes reviewed, discussed with covering physician 2.  DVT Prophylaxis/Anticoagulation: Subcutaneous Lovenox. Monitor for any bleeding episodes 3. Pain Management/chronic pain: Soma 350 mg 3 times a day, Neurontin 1200 mg 3 times a day, MS Contin 15 mg every 12 hours, Robaxin and oxycodone as needed 4. Mood: Effexor 75 mg twice a day Trazodone 25 mg daily at bedtime as needed 5. Neuropsych: This patient is capable of making decisions on his own behalf. 6. Skin/Wound Care: Routine skin checks 7.  Fluids/Electrolytes/Nutrition: Routine I&O's   Started IVF, Cr. Improving on 1/7 8. Acute blood loss anemia. Continue iron supplement.    Hb 11.2 on 1/5   Labs ordered for tomorrow   Cont to monitor 9. Diabetes mellitus and peripheral neuropathy. Hemoglobin A1c 6.5. Currently with SSI. Check blood sugars before meals and at bedtime. Diabetic teaching. Patient on Glucophage 500 mg twice a day prior to admission. Resume as needed   Metformin 500 daily started on 1/2-will hold due to watery stool   Monitor with increased mobility CBG (last 3)  Recent Labs    08/01/17 1629 08/01/17 2145 08/02/17 0619  GLUCAP 123* 189* 136*    Relatively controlled on 1/8 10. Hypertension. Norvasc 10 mg daily.   Slightly elevated on 1/8, will consider further adjustments tomorrow 11. Hyperlipidemia. Zoc2or 12. Constipation. Laxative assistance 13. Hyponatremia   Na 127 on 1/6   IV .9NS cont 50ml /hr   Labs ordered for tomorrow   Cont to monitor 14. Leukocytosis   Improving, afebrile  WBCs 11.7 on 1/6   Labs ordered for tomorrow   Cont to monitor 15.  RUE swelling negative duplex doppler 1/5 16.  Confusion   D/ced soma and changed oxyIR to Q6   Gabapentin reduced to 600mg  TID   MS contin changed to tramadol 17.  Loose watery stools, clinically appears to have partial ileus with KUB demonstrating  Large and small bowel gas distension, give fleets today   Pt also recently started on metformin will hold for now   ?Improving  LOS (Days) 8 A FACE TO FACE EVALUATION WAS PERFORMED  Yulitza Shorts Karis Juba 08/02/2017 8:19 AM

## 2017-08-02 NOTE — Progress Notes (Signed)
Occupational Therapy Session Note  Patient Details  Name: Justin PlateSheldon C Stone MRN: [id number] Date of Birth: 07-26-1961  Today's Date: 08/02/2017 OT Individual Time: [id number] OT Individual Time Calculation (min): 58 min    Short Term Goals: Week 1:  OT Short Term Goal 1 (Week 1): Pt will complete BSC transfer with S OT Short Term Goal 2 (Week 1): Pt will complete LB washing with S OT Short Term Goal 3 (Week 1): Pt will complete grooming at sink with S OT Short Term Goal 4 (Week 1): Pt will use w/c to gather clothes in room for dressing task with S   Skilled Therapeutic Interventions/Progress Updates:    Pt completed bathing and dressing in supine this session.  Pt severely confused, unable to state place, time, or situation.  Difficulty noted  rationalizing with him regarding the need for him to have therapist assistance to clean up BM.  Mod assist for rolling side to side with total assist for cleaning up buttocks and incontinent BM.  Pt with increased soreness when therapist attempted to wipe him.  Pt placing hand in BM as well secondary to confusion.  Total assist for donning new brief in supine.  Pt was able to wash some of his UB with max instructional cueing in supine.  Had nursing come in to assist with cleaning and changing of linens and brief after second incontinent episode as well.  Pt left with nursing in the room and call button in reach.  Discussed significant changes in pt's cognition with PA Dan as well and they are examining labs at this time.    Therapy Documentation Precautions:  Precautions Precautions: Fall Required Braces or Orthoses: Other Brace/Splint Other Brace/Splint: Has LLE splint for positioning of amputation Restrictions Weight Bearing Restrictions: Yes LLE Weight Bearing: Non weight bearing Other Position/Activity Restrictions: has R prosthtic leg for R BKA (OLD); L BKA (NEW) Pain: Pain Assessment Pain Assessment: No/denies pain ADL: See Function Navigator  for Current Functional Status.   Therapy/Group: Individual Therapy  Amanee Iacovelli OTR/L 08/02/2017, 12:19 PM

## 2017-08-02 NOTE — Progress Notes (Signed)
Speech Language Pathology Daily Session Note  Patient Details  Name: Justin Stone MRN: [id number] Date of Birth: [birth year]  Today's Date: 08/02/2017 SLP Individual Time: 1450-1533 SLP Individual Time Calculation (min): 43 min  Short Term Goals: Week 1: SLP Short Term Goal 1 (Week 1): Pt will recall basic, daily information with mod verbal cues for use of external aids.   SLP Short Term Goal 2 (Week 1): Pt will sustain his attention to basic, functional tasks for 5 minute intervals with mod verbal cues for redirection.   SLP Short Term Goal 3 (Week 1): Pt will complete basic, familiar tasks with mod assist for functional problem solving.   SLP Short Term Goal 4 (Week 1): Pt will recognize and correct errors when completing basic, familiar tasks with max assist.    Skilled Therapeutic Interventions:  Pt was seen for skilled ST targeting cognitive goals.  Pt was received in bed, nursing present and completing hygiene following bowel incontinence.  Pt was oriented to place with max question cues and to situation with mod-max assist cues for use of external aids.  He needed max to total assist for use of a calendar to reorient to date due to decreased attention to task.  Therapist initiated use of a memory notebook in the hopes of facilitating better carryover of daily information.  Pt needed total assist to record today's date and 1 event due to perseveration on "visiting family."  Therapist decreased task challenge and recorded the names of pt's therapy team members into notebook.  Pt needed max to total assist over 3-4 repetitions to identify pertinent information from memory notebook to name therapy staff and goals of therapy.   Pt's sister in law was present at the end of today's therapy session and SLP instructed her to record information into memory notebook regarding visitors.  Pt was hallucinating throughout therapy session.   RN made aware as pt presents with decline in cognitive function  since initial evaluation.  Pt left in bed with bed alarm set and call bell within reach.  Continue per current plan of care.    Function:  Eating Eating                 Cognition Comprehension Comprehension assist level: Understands basic less than 25% of the time/ requires cueing >75% of the time  Expression   Expression assist level: Expresses basic 50 - 74% of the time/requires cueing 25 - 49% of the time. Needs to repeat parts of sentences.  Social Interaction Social Interaction assist level: Interacts appropriately 25 - 49% of time - Needs frequent redirection.  Problem Solving Problem solving assist level: Solves basic less than 25% of the time - needs direction nearly all the time or does not effectively solve problems and may need a restraint for safety  Memory Memory assist level: Recognizes or recalls less than 25% of the time/requires cueing greater than 75% of the time    Pain Pain Assessment Pain Assessment: No/denies pain  Therapy/Group: Individual Therapy  Justin Stone, Justin Stone 08/02/2017, 3:52 PM

## 2017-08-02 NOTE — Progress Notes (Signed)
Physical Therapy Weekly Progress Note  Patient Details  Name: Justin Stone MRN: [id number] Date of Birth: 10/16/1960  Beginning of progress report period: July 26, 2017 End of progress report period: August 02, 2017  Today's Date: 08/02/2017 PT Individual Time: 1000-1100 PT Individual Time Calculation (min): 60 min   Patient has met 0 of 3 short term goals. Pt has declined in function and cognition over last week since eval on 07/26/17. Pt currently requires mod assist w/ bed mobility and is unable to safely transfer to EOB at this time 2/2 impaired attention, awareness, motor planning, and sequencing. Pt remains A+Ox1 during treatment sessions and requires max cues for awareness of deficits (recent L BKA). He requires use of maximove lift for safety during transfers to tilt-in-space w/c. He remains at w/c level 2/2 L BKA and existing R BKA wound preventing safe ambulation w/ R prosthesis.   Patient continues to demonstrate the following deficits muscle weakness, decreased cardiorespiratoy endurance, decreased motor planning, decreased initiation, decreased attention, decreased awareness, decreased problem solving, decreased safety awareness, decreased memory and delayed processing and decreased sitting balance, decreased postural control and difficulty maintaining precautions and therefore will continue to benefit from skilled PT intervention to increase functional independence with mobility. Will continued to focus treatment on decreasing burden of care and discharge planning.  Patient not progressing toward long term goals.  See goal revision..  Plan of care revisions: Goals downgraded to mod assist overall 2/2 lack of progress. Gait goal discontinued 2/2 R BKA wound preventing use of R prosthesis..  PT Short Term Goals Week 1:  PT Short Term Goal 1 (Week 1): Pt will don and doff RLE BKA prosthesis w/ set-up assist while sitting at EOB or in w/c PT Short Term Goal 1 - Progress (Week 1):  Discontinued (comment)(NWB R BKA prosthesis at this time 2/2 wound) PT Short Term Goal 2 (Week 1): Pt will self-propel w/c 150' w/ supervision PT Short Term Goal 2 - Progress (Week 1): Progressing toward goal PT Short Term Goal 3 (Week 1): Pt will transfer bed<>chair via slide board transfer w/ supervision  PT Short Term Goal 3 - Progress (Week 1): Not progressing PT Short Term Goal 4 (Week 1): Pt will perform sit<>stand transfer w/ Max assist using LRAD PT Short Term Goal 4 - Progress (Week 1): Discontinued (comment)(Pt NWB bilaterally at this time) PT Short Term Goal 5 (Week 1): Pt will maintain dynamic sitting balance during functional activity w/ supervision w/o UE support PT Short Term Goal 5 - Progress (Week 1): Not progressing Week 2:  PT Short Term Goal 1 (Week 2): =LTGs due to ELOS  Skilled Therapeutic Interventions/Progress Updates:   Pt supine upon arrival and agreeable to get up to w/c, no c/o pain. After total assist to don L BKA stump protector, pt reporting needing to have BM. Verbal cues to hold BM until bed pan could be placed, however already starting voiding. Total assist to place bed pan and provide pericare w/ mod assist to roll. Continent voiding of bladder as well w/ set-up assist. Pt A+Ox1 this session, unable to recall that he had recent L BKA despite max cues. Transferred to tilt-in-space via maximove. Fixed leg rests to amputee pads for both BKAs. Ended session tilted in w/c, seat belt on, call bell within reach and all needs met.   Therapy Documentation Precautions:  Precautions Precautions: Fall Required Braces or Orthoses: Other Brace/Splint Other Brace/Splint: Has LLE splint for positioning of amputation Restrictions Weight Bearing Restrictions:   Yes LLE Weight Bearing: Non weight bearing Other Position/Activity Restrictions: has R prosthtic leg for R BKA (OLD); L BKA (NEW)  See Function Navigator for Current Functional Status.  Therapy/Group: Individual  Therapy   K Arnette 08/02/2017, 12:17 PM  

## 2017-08-02 NOTE — Progress Notes (Signed)
Occupational Therapy Weekly Progress Note  Patient Details  Name: Justin Stone MRN: [id number] Date of Birth: May 28, 1961  Beginning of progress report period: July 26, 2017 End of progress report period: August 02, 2017  Today's Date: 08/02/2017 OT Individual Time: 1402-1450 OT Individual Time Calculation (min): 48 min    Patient has met 0 of 4 short term goals.  Pt has regressed in functional abilities at this time related to transfers and selfcare tasks.  He currently needs min assist or greater for UB selfcare with max to total assist for LB selfcare at bed level, secondary to worsening cognitive deficits.  Pt currently demonstrates no intellectual awareness of deficits and is not oriented to person, place, or time.  He is not able to maintain sustained attention to tasks or conversation, and at times demonstrates hallucinations of objects or people being in the room.  Before cognition pt was able to complete UB selfcare at supervision level and LB selfcare at min assist.  Will continue with current OT treatment POC in hopes that cognition improves so pt can get back on track with ADL function.  Do not feel however pt will reach modified independent level for discharge home and will need 24 hour supervision.    Patient continues to demonstrate the following deficits: muscle weakness, decreased awareness, decreased problem solving, decreased safety awareness, decreased memory and delayed processing and decreased sitting balance and decreased balance strategies and therefore will continue to benefit from skilled OT intervention to enhance overall performance with BADL.  Patient not progressing toward long term goals.  See goal revision..  Continue plan of care.  OT Short Term Goals Week 2:  OT Short Term Goal 1 (Week 2): Pt will complete all UB and LB bathing sitting with lateral leans side to side and min assist.   OT Short Term Goal 2 (Week 2): Pt will donn pull up shorts or pants with min  assist and lateral leans side to side.  OT Short Term Goal 3 (Week 2): Pt will complete 3 grooming tasks from wheelchair level at the sink with supervision and no more than min instructional cueing.   OT Short Term Goal 4 (Week 2): Pt iwll perform toilet transfer with min assist using sliding board to drop arm commode.    Skilled Therapeutic Interventions/Progress Updates:    Pt in wheelchair eating lunch with friends visiting to start session.  Pt still not oriented to place, time, or situation.  When given external aides to location and reason for hospitalization, he is able to read them off of a sheet, but then cannot retain them at all.  Pt worked on self feeding at supervision level, while therapist and visitors continued re-directing him to situation.  Next, utilized the [employer] to transfer pt back to the bed.  During transfer pt demonstrated bowel accident in the brief.  Therapist and nursing worked on cleaning up BM with pt rolling side to side with min to mod assist.  Pt left with nursing and NT to complete donning new brief and gown with pt in bed to rest.    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   Therapy/Group: Individual Therapy  Samira Acero OTR/L 08/02/2017, 4:07 PM

## 2017-08-03 ENCOUNTER — Inpatient Hospital Stay (HOSPITAL_COMMUNITY): Payer: Medicare Other | Admitting: Speech Pathology

## 2017-08-03 ENCOUNTER — Inpatient Hospital Stay (HOSPITAL_COMMUNITY): Payer: Medicare Other | Admitting: Physical Therapy

## 2017-08-03 ENCOUNTER — Inpatient Hospital Stay (HOSPITAL_COMMUNITY): Payer: Medicare Other | Admitting: Occupational Therapy

## 2017-08-03 DIAGNOSIS — R410 Disorientation, unspecified: Secondary | ICD-10-CM

## 2017-08-03 DIAGNOSIS — E876 Hypokalemia: Secondary | ICD-10-CM

## 2017-08-03 LAB — CBC WITH DIFFERENTIAL/PLATELET
Basophils Absolute: 0.1 10*3/uL (ref 0.0–0.1)
Basophils Relative: 1 %
Eosinophils Absolute: 0.3 10*3/uL (ref 0.0–0.7)
Eosinophils Relative: 4 %
HEMATOCRIT: 30.1 % — AB (ref 39.0–52.0)
HEMOGLOBIN: 9.3 g/dL — AB (ref 13.0–17.0)
LYMPHS PCT: 14 %
Lymphs Abs: 1.1 10*3/uL (ref 0.7–4.0)
MCH: 30 pg (ref 26.0–34.0)
MCHC: 30.9 g/dL (ref 30.0–36.0)
MCV: 97.1 fL (ref 78.0–100.0)
Monocytes Absolute: 0.8 10*3/uL (ref 0.1–1.0)
Monocytes Relative: 10 %
NEUTROS ABS: 5.3 10*3/uL (ref 1.7–7.7)
NEUTROS PCT: 71 %
Platelets: 186 10*3/uL (ref 150–400)
RBC: 3.1 MIL/uL — AB (ref 4.22–5.81)
RDW: 15.4 % (ref 11.5–15.5)
WBC: 7.5 10*3/uL (ref 4.0–10.5)

## 2017-08-03 LAB — GLUCOSE, CAPILLARY
GLUCOSE-CAPILLARY: 146 mg/dL — AB (ref 65–99)
Glucose-Capillary: 138 mg/dL — ABNORMAL HIGH (ref 65–99)
Glucose-Capillary: 139 mg/dL — ABNORMAL HIGH (ref 65–99)
Glucose-Capillary: 124 mg/dL — ABNORMAL HIGH (ref 65–99)

## 2017-08-03 LAB — BASIC METABOLIC PANEL
Anion gap: 9 (ref 5–15)
BUN: 10 mg/dL (ref 6–20)
CHLORIDE: 111 mmol/L (ref 101–111)
CO2: 19 mmol/L — AB (ref 22–32)
Calcium: 9 mg/dL (ref 8.9–10.3)
Creatinine, Ser: 0.87 mg/dL (ref 0.61–1.24)
GFR calc Af Amer: >60 mL/min (ref >60)
GFR calc non Af Amer: >60 mL/min (ref >60)
GLUCOSE: 132 mg/dL — AB (ref 65–99)
POTASSIUM: 2.8 mmol/L — AB (ref 3.5–5.1)
SODIUM: 139 mmol/L (ref 135–145)

## 2017-08-03 LAB — OCCULT BLOOD X 1 CARD TO LAB, STOOL: FECAL OCCULT BLD: POSITIVE — AB

## 2017-08-03 LAB — AMMONIA: AMMONIA: 48 umol/L — AB (ref 9–35)

## 2017-08-03 MED ORDER — GERHARDT'S BUTT CREAM
TOPICAL_CREAM | Freq: Two times a day (BID) | CUTANEOUS | Status: DC
  Administered 2017-08-03 – 2017-08-18 (×27): via TOPICAL
  Filled 2017-08-03 (×3): qty 1

## 2017-08-03 MED ORDER — HYDRALAZINE HCL 10 MG PO TABS
10.0000 mg | ORAL_TABLET | Freq: Three times a day (TID) | ORAL | Status: DC
  Administered 2017-08-03 – 2017-08-04 (×4): 10 mg via ORAL
  Filled 2017-08-03 (×4): qty 1

## 2017-08-03 MED ORDER — POTASSIUM CHLORIDE CRYS ER 20 MEQ PO TBCR
40.0000 meq | EXTENDED_RELEASE_TABLET | Freq: Two times a day (BID) | ORAL | Status: AC
Start: 2017-08-03 — End: 2017-08-05
  Administered 2017-08-03 – 2017-08-04 (×3): 40 meq via ORAL
  Filled 2017-08-03 (×4): qty 2

## 2017-08-03 MED ORDER — LACTULOSE 10 GM/15ML PO SOLN
10.0000 g | Freq: Once | ORAL | Status: AC
  Administered 2017-08-03: 10 g via ORAL
  Filled 2017-08-03: qty 15

## 2017-08-03 NOTE — Progress Notes (Signed)
Occupational Therapy Session Note  Patient Details  Name: Justin Stone MRN: [id number] Date of Birth: June 21, 1961  Today's Date: 08/03/2017 OT Individual Time: [id number] OT Individual Time Calculation (min): 76 min    Short Term Goals: Week 2:  OT Short Term Goal 1 (Week 2): Pt will complete all UB and LB bathing sitting with lateral leans side to side and min assist.   OT Short Term Goal 2 (Week 2): Pt will donn pull up shorts or pants with min assist and lateral leans side to side.  OT Short Term Goal 3 (Week 2): Pt will complete 3 grooming tasks from wheelchair level at the sink with supervision and no more than min instructional cueing.   OT Short Term Goal 4 (Week 2): Pt iwll perform toilet transfer with min assist using sliding board to drop arm commode.    Skilled Therapeutic Interventions/Progress Updates:    Pt completed bathing and dressing sitting on the EOB to supine.  He still demonstrates severe confusion and inability to recall any information given to him with regards to orientation or place.  Still with constant confabulation of speech, without any ability to stay on target verbally.  He needed max assist to transition to the EOB for bathing tasks.  He maintained sitting balance while completing UB bathing with supervision.  Max instructional cueing for sequencing bathing secondary to limited memory and attention.  He was able to [employer] pull[employer] shirt with supervision.  He transitioned back to supine with mod assist for washing peri area and donning new brief and sweat pants.  He was able to roll with mod assist and max demonstrational cueing.  Max assist for washing buttocks with total assist to donn new brief.  Total assist to pull pants over the hips.  Next had pt transfer back to sitting with max assist and then to the tilt in space wheelchair with mod assist via sliding board.  Pt left in wheelchair with slight tilt and seat belt fastened.  Call button and phone in reach with  nursing present as well.    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   Therapy/Group: Individual Therapy  Burdett Pinzon OTR/L 08/03/2017, 12:08 PM

## 2017-08-03 NOTE — Discharge Instructions (Signed)
Inpatient Rehab Discharge Instructions  Justin Stone Discharge date and time: No discharge date for patient encounter.   Activities/Precautions/ Functional Status: Activity: activity as tolerated Diet: diabetic diet Wound Care: keep wound clean and dry Functional status:  ___ No restrictions     ___ Walk up steps independently ___ 24/7 supervision/assistance   ___ Walk up steps with assistance ___ Intermittent supervision/assistance  ___ Bathe/dress independently ___ Walk with walker     ___ Bathe/dress with assistance ___ Walk Independently    ___ Shower independently ___ Walk with assistance    ___ Shower with assistance ___ No alcohol     ___ Return to work/school ________  Special Instructions:    My questions have been answered and I understand these instructions. I will adhere to these goals and the provided educational materials after my discharge from the hospital.  Patient/Caregiver Signature _______________________________ Date __________  Clinician Signature _______________________________________ Date __________  Please bring this form and your medication list with you to all your follow-up doctor's appointments.

## 2017-08-03 NOTE — Progress Notes (Addendum)
[place] [facility]IC[facility] RE[facility]ILITATION     PROGRESS NOTE  Subjective/Complaints:  Pt seen lying in bed this AM.  No reported issues overnight.  Pt remains confused.  ROS: Unreliable due to cognition.  Objective: Vital Signs: Blood pressure (!) 153/75, pulse 97, temperature 98.1 F (36.7 C), temperature source Oral, resp. rate 16, height 5\' 7"  (1.702 m), weight 114 kg (251 lb 5.2 oz), SpO2 100 %. No results found. Recent Labs    08/03/17 0549  WBC 7.5  HGB 9.3*  HCT 30.1*  PLT 186   Recent Labs    08/01/17 0605 08/03/17 0549  NA  --  139  K  --  2.8*  CL  --  111  GLUCOSE  --  132*  BUN  --  10  CREATININE 1.15 0.87  CALCIUM  --  9.0   CBG (last 3)  Recent Labs    08/02/17 1657 08/02/17 2107 08/03/17 0611  GLUCAP 128* 153* 146*    Wt Readings from Last 3 Encounters:  08/03/17 114 kg (251 lb 5.2 oz)  07/21/17 113.4 kg (250 lb)  07/21/17 113.8 kg (250 lb 12.8 oz)    Physical Exam:  BP (!) 153/75 (BP Location: Left Arm)   Pulse 97   Temp 98.1 F (36.7 C) (Oral)   Resp 16   Ht 5\' 7"  (1.702 m)   Wt 114 kg (251 lb 5.2 oz)   SpO2 100%   BMI 39.36 kg/m  General: NAD. Vitals stable. HENT: Normocephalic. Atraumatic Eyes: EOMI. No discharge Cardiac. RRR.  No JVD. Respiratory. Clear to auscultation. Unlabored.  Abdomen: good bowel sounds. nondistended Musculoskeletal: Bilateral BKA Neurological. Alert and oriented to person only  Followed full commands. Motor: B/l UE 5/5 proximal to distal LLE: HF 4-/5 (unchanged) RLE: HF, KE 4+/5 Skin. Left BKA with dressing c/d/i  Right BKA with small ulcer area at base of stump.  Assessment/Plan: 1. Functional deficits secondary to bilateral BKA which require 3+ hours per day of interdisciplinary therapy in a comprehensive inpatient rehab setting. Physiatrist is providing close team supervision and 24 hour management of active medical problems listed below. Physiatrist and rehab team continue to assess  barriers to discharge/monitor patient progress toward functional and medical goals.  Function:  Bathing Bathing position   Position: Bed(supine)  Bathing parts Body parts bathed by patient: Abdomen, Chest, Front perineal area Body parts bathed by helper: Buttocks, Right upper leg, Left upper leg  Bathing assist Assist Level: Touching or steadying assistance(Pt > 75%)      Upper Body Dressing/Undressing Upper body dressing   What is the patient wearing?: Hospital gown     Pull over shirt/dress - Perfomed by patient: Thread/unthread right sleeve, Thread/unthread left sleeve, Put head through opening, Pull shirt over trunk Pull over shirt/dress - Perfomed by helper: Pull shirt over trunk        Upper body assist Assist Level: Supervision or verbal cues      Lower Body Dressing/Undressing Lower body dressing   What is the patient wearing?: Hospital Gown     Pants- Performed by patient: Thread/unthread left pants leg Pants- Performed by helper: Thread/unthread right pants leg, Pull pants up/down                      Lower body assist Assist for lower body dressing: Touching or steadying assistance (Pt > 75%)      Toileting Toileting   Toileting steps completed by patient: Performs perineal hygiene Toileting  steps completed by helper: Adjust clothing prior to toileting, Performs perineal hygiene, Adjust clothing after toileting Toileting Assistive Devices: Grab bar or rail  Toileting assist Assist level: Touching or steadying assistance (Pt.75%)   Transfers Chair/bed transfer   Chair/bed transfer method: Other Chair/bed transfer assist level: dependent (Pt equals 0%) Chair/bed transfer assistive device: Mechanical lift Mechanical lift: Maximove   Locomotion Ambulation Ambulation activity did not occur: Safety/medical concerns         Wheelchair   Type: Manual Max wheelchair distance: 100' Assist Level: Supervision or verbal cues  Cognition Comprehension  Comprehension assist level: Understands basic less than 25% of the time/ requires cueing >75% of the time  Expression Expression assist level: Expresses basic 50 - 74% of the time/requires cueing 25 - 49% of the time. Needs to repeat parts of sentences.  Social Interaction Social Interaction assist level: Interacts appropriately 25 - 49% of time - Needs frequent redirection.  Problem Solving Problem solving assist level: Solves basic less than 25% of the time - needs direction nearly all the time or does not effectively solve problems and may need a restraint for safety  Memory Memory assist level: Recognizes or recalls less than 25% of the time/requires cueing greater than 75% of the time    Medical Problem List and Plan: 1.  Decreased functional mobility secondary to left BKA 07/21/2017 as well as history of right BKA. Patient does use a right prosthesis   Cont CIR  2.  DVT Prophylaxis/Anticoagulation: Subcutaneous Lovenox. Monitor for any bleeding episodes 3. Pain Management/chronic pain: Soma 350 mg 3 times a day, Neurontin 1200 mg 3 times a day, MS Contin 15 mg every 12 hours, Robaxin and oxycodone as needed 4. Mood: Effexor 75 mg twice a day Trazodone 25 mg daily at bedtime as needed 5. Neuropsych: This patient is capable of making decisions on his own behalf. 6. Skin/Wound Care: Routine skin checks 7. Fluids/Electrolytes/Nutrition: Routine I&O's   Cr. 0.87 on 1/9 8. Acute blood loss anemia. Continue iron supplement.    Hb 9.3 on 1/9   Hemoccult stools ordered 1/9   Cont to monitor 9. Diabetes mellitus and peripheral neuropathy. Hemoglobin A1c 6.5. Currently with SSI. Check blood sugars before meals and at bedtime. Diabetic teaching. Patient on Glucophage 500 mg twice a day prior to admission. Resume as needed   Metformin 500 daily started on 1/2, d/ced due to watery stool   Monitor with increased mobility CBG (last 3)  Recent Labs    08/02/17 1657 08/02/17 2107 08/03/17 0611   GLUCAP 128* 153* 146*    Relatively controlled on 1/9 10. Hypertension. Norvasc 10 mg daily.   Hydralazine 10 TID started on 1/9 11. Hyperlipidemia. Zoc2or 12. Constipation. Laxative assistance 13. Hyponatremia   Na 139 on 1/9   Cont to monitor 14. Leukocytosis: Resolved   Improving, afebrile      WBCs 7.5 on 1/9   Cont to monitor 15.  RUE swelling negative duplex doppler 1/5 16.  Confusion   D/ced soma and changed oxyIR to Q6   Gabapentin reduced to 600mg  TID, d/ced on 1/9   MS contin changed to tramadol   LFTs and ammonia ordered for tomorrow 17.  Loose watery stools, clinically appears to have partial ileus with KUB demonstrating  Large and small bowel gas distension, give fleets today   Pt also recently started on metformin will hold for now   Improving 18. Hypokalemia   K 2.8 on 1/9   Supplement increased x2 days  Labs ordered for tomorrow  LOS (Days) 9 A FACE TO FACE EVALUATION WAS PERFORMED  Eveleen Mcnear Karis Jubanil Riane Rung 08/03/2017 8:06 AM

## 2017-08-03 NOTE — Progress Notes (Signed)
Physical Therapy Session Note  Patient Details  Name: Justin Stone MRN: [id number] Date of Birth: 1960/08/19  Today's Date: 08/03/2017 PT Individual Time: 0935-1030 PT Individual Time Calculation (min): 55 min   Short Term Goals: Week 2:  PT Short Term Goal 1 (Week 2): =LTGs due to ELOS  Skilled Therapeutic Interventions/Progress Updates: Pt presented in TIS chair, orientated to self. Attempted to reorient to place, time, and situation however no carryover. Pt transported to rehab gym and participated in UE therex with use of dowels, weighted ball, and hand weights. Pt required mod cues to complete reps due to pt peserverating on need to go to "the house" and waiting on his mother. Multiple attempts to re-direct pt through session. Pt attempted UBE however would state that pulling arms despite repositioning of TIS chair. Pt returned to room and left with call bell within reach and nsg notified of pt requesting snack.    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   Therapy/Group: Individual Therapy  Justin Stone  Justin Stone, PTA  08/03/2017, 4:13 PM

## 2017-08-03 NOTE — Progress Notes (Signed)
Speech Language Pathology Daily Session Note  Patient Details  Name: SPIROS GREENFELD MRN: [id number] Date of Birth: 31-Jul-1960  Today's Date: 08/03/2017 SLP Individual Time: 1300-1330; 4098-1191 SLP Individual Time Calculation (min): 30 min; 38 minutes  Short Term Goals: Week 1: SLP Short Term Goal 1 (Week 1): Pt will recall basic, daily information with mod verbal cues for use of external aids.   SLP Short Term Goal 2 (Week 1): Pt will sustain his attention to basic, functional tasks for 5 minute intervals with mod verbal cues for redirection.   SLP Short Term Goal 3 (Week 1): Pt will complete basic, familiar tasks with mod assist for functional problem solving.   SLP Short Term Goal 4 (Week 1): Pt will recognize and correct errors when completing basic, familiar tasks with max assist.    Skilled Therapeutic Interventions:  Session 1:  Pt was seen for skilled ST targeting cognitive goals.  Pt needed total assist multimodal cues for use of environmental aids to facilitate recall of orientation information.  Pt continues to be confabulatory with significant language of confusion.  Therapist facilitated the session with a simple card game to address sustained attention to task.  Pt initially needed max-total assist to attend to task; however, SLP was able to fade cues to mod assist for redirection as task progressed.  Pt was able to immediately recall activity for recording in his memory notebook with mod-max question cues.  Pt's close friend arrived at the end of today's therapy session and was encouraged to record times of his visits into memory notebook.  Pt was left in wheelchair with call bell within reach and friend at bedside.  Continue per current plan of care.    Session 2:  Pt was seen for skilled ST targeting cognitive goals.  Upon therapist's arrival, pt stated "Don't come in here, it stinks."  Pt had been incontinent of bowel and had not utilized call bell to convey needs to nursing.  SLP  provided education regarding the need for timely hygiene following incontinence episodes to prevent skin breakdown and infection.  Pt had no carryover of education.  He needed total assist to reorient to place as he stated that he thought he was at home.  Multiple staff members were needed to assist in changing pt due to pt reporting sore scrotum and buttocks with wiping during hygiene and pt actively resisting staff's attempts to clean him.  Pt was able to be briefly redirected with max assist verbal cues to the point where he would allow care to be performed; however, given his poor memory and attention, those periods were brief  (~30 seconds- 1 minute).  Pt was left in the care of multiple staff members upon therapist's departure.    Function:  Eating Eating              Cognition Comprehension Comprehension assist level: Understands basic 50 - 74% of the time/ requires cueing 25 - 49% of the time  Expression   Expression assist level: Expresses basic 25 - 49% of the time/requires cueing 50 - 75% of the time. Uses single words/gestures.  Social Interaction Social Interaction assist level: Interacts appropriately 25 - 49% of time - Needs frequent redirection.  Problem Solving Problem solving assist level: Solves basic less than 25% of the time - needs direction nearly all the time or does not effectively solve problems and may need a restraint for safety  Memory Memory assist level: Recognizes or recalls less than 25% of  the time/requires cueing greater than 75% of the time    Pain Pain Assessment Pain Assessment: No/denies pain  Therapy/Group: Individual Therapy  Catalena Stanhope, [name] 08/03/2017, 2:19 PM

## 2017-08-03 NOTE — Plan of Care (Signed)
  Not Progressing Consults RH LIMB LOSS PATIENT EDUCATION Description Description: See Patient Education module for eduction specifics. 08/03/2017 1044 - Not Progressing by Alfonso RamusEvans, Laquetta Racey S, RN Note No retention of education RH BOWEL ELIMINATION RH STG MANAGE BOWEL WITH ASSISTANCE Description STG Manage Bowel with min Assistance.  08/03/2017 1044 - Not Progressing by Alfonso RamusEvans, Lynden Flemmer S, RN Note Total assist  RH SKIN INTEGRITY RH STG SKIN FREE OF INFECTION/BREAKDOWN Description Patients skin will remain free from further infection or breakdown with mod assist.  08/03/2017 1044 - Not Progressing by Alfonso RamusEvans, Nolan Tuazon S, RN Note TOtal assist  RH STG ABLE TO PERFORM INCISION/WOUND CARE W/ASSISTANCE Description STG Able To Perform Incision/Wound Care With total Assistance.  08/03/2017 1044 - Not Progressing by Alfonso RamusEvans, Tahliyah Anagnos S, RN RH SAFETY RH STG ADHERE TO SAFETY PRECAUTIONS W/ASSISTANCE/DEVICE Description STG Adhere to Safety Precautions With mod I Assistance/Device.  08/03/2017 1044 - Not Progressing by Alfonso RamusEvans, Ayven Glasco S, RN Note Multiple attempts to get out of bed  RH KNOWLEDGE DEFICIT LIMB LOSS RH STG INCREASE KNOWLEDGE OF SELF CARE AFTER LIMB LOSS Description Patient will verbalize understanding of limb loss education materials as presented by staff with min assist.  08/03/2017 1044 - Not Progressing by Alfonso RamusEvans, Kimia Finan S, RN

## 2017-08-04 ENCOUNTER — Inpatient Hospital Stay (HOSPITAL_COMMUNITY): Payer: Medicare Other | Admitting: Occupational Therapy

## 2017-08-04 ENCOUNTER — Inpatient Hospital Stay (HOSPITAL_COMMUNITY): Payer: Medicare Other | Admitting: Speech Pathology

## 2017-08-04 ENCOUNTER — Inpatient Hospital Stay (HOSPITAL_COMMUNITY): Payer: Medicare Other

## 2017-08-04 ENCOUNTER — Inpatient Hospital Stay (HOSPITAL_COMMUNITY): Payer: Medicare Other | Admitting: Physical Therapy

## 2017-08-04 DIAGNOSIS — I10 Essential (primary) hypertension: Secondary | ICD-10-CM

## 2017-08-04 DIAGNOSIS — R413 Other amnesia: Secondary | ICD-10-CM

## 2017-08-04 DIAGNOSIS — K9189 Other postprocedural complications and disorders of digestive system: Secondary | ICD-10-CM

## 2017-08-04 DIAGNOSIS — K567 Ileus, unspecified: Secondary | ICD-10-CM

## 2017-08-04 LAB — GLUCOSE, CAPILLARY
GLUCOSE-CAPILLARY: 143 mg/dL — AB (ref 65–99)
GLUCOSE-CAPILLARY: 137 mg/dL — AB (ref 65–99)
Glucose-Capillary: 169 mg/dL — ABNORMAL HIGH (ref 65–99)
Glucose-Capillary: 125 mg/dL — ABNORMAL HIGH (ref 65–99)

## 2017-08-04 LAB — OCCULT BLOOD X 1 CARD TO LAB, STOOL: FECAL OCCULT BLD: POSITIVE — AB

## 2017-08-04 MED ORDER — LORAZEPAM 0.5 MG PO TABS: 0.5000 mg | ORAL_TABLET | Freq: Once | ORAL | Status: DC

## 2017-08-04 MED ORDER — VITAMIN B-1 100 MG PO TABS: 500.0000 mg | ORAL_TABLET | Freq: Three times a day (TID) | ORAL | Status: DC

## 2017-08-04 MED ORDER — VITAMIN B-1 100 MG PO TABS
100.0000 mg | ORAL_TABLET | Freq: Every day | ORAL | Status: DC
  Administered 2017-08-04: 100 mg via ORAL
  Filled 2017-08-04: qty 1

## 2017-08-04 MED ORDER — VITAMIN B-1 100 MG PO TABS
100.0000 mg | ORAL_TABLET | Freq: Every day | ORAL | Status: DC
  Administered 2017-08-08 – 2017-08-18 (×11): 100 mg via ORAL
  Filled 2017-08-04 (×11): qty 1

## 2017-08-04 MED ORDER — LACTULOSE 10 GM/15ML PO SOLN
20.0000 g | Freq: Two times a day (BID) | ORAL | Status: DC
  Administered 2017-08-04 – 2017-08-05 (×3): 20 g via ORAL
  Filled 2017-08-04 (×4): qty 30

## 2017-08-04 MED ORDER — HYDRALAZINE HCL 25 MG PO TABS
25.0000 mg | ORAL_TABLET | Freq: Three times a day (TID) | ORAL | Status: DC
  Administered 2017-08-04 – 2017-08-10 (×16): 25 mg via ORAL
  Filled 2017-08-04 (×18): qty 1

## 2017-08-04 MED ORDER — VITAMIN B-1 100 MG PO TABS
500.0000 mg | ORAL_TABLET | Freq: Three times a day (TID) | ORAL | Status: DC
  Administered 2017-08-04: 500 mg via ORAL
  Filled 2017-08-04: qty 5

## 2017-08-04 MED ORDER — QUETIAPINE FUMARATE 25 MG PO TABS
25.0000 mg | ORAL_TABLET | Freq: Every day | ORAL | Status: DC
  Administered 2017-08-05 – 2017-08-08 (×4): 25 mg via ORAL
  Filled 2017-08-04 (×4): qty 1

## 2017-08-04 MED ORDER — THIAMINE HCL 100 MG/ML IJ SOLN
500.0000 mg | Freq: Three times a day (TID) | INTRAVENOUS | Status: AC
  Administered 2017-08-05 – 2017-08-07 (×6): 500 mg via INTRAVENOUS
  Filled 2017-08-04 (×10): qty 5

## 2017-08-04 MED ORDER — QUETIAPINE FUMARATE 25 MG PO TABS
25.0000 mg | ORAL_TABLET | Freq: Two times a day (BID) | ORAL | Status: DC | PRN
  Administered 2017-08-04 – 2017-08-16 (×4): 25 mg via ORAL
  Filled 2017-08-04 (×6): qty 1

## 2017-08-04 NOTE — Progress Notes (Signed)
Physical Therapy Session Note  Patient Details  Name: Justin Stone MRN: [id number] Date of Birth: 14-Mar-1961  Today's Date: 08/04/2017 PT Individual Time: 0820-0926 PT Individual Time Calculation (min): 66 min   Short Term Goals: Week 2:  PT Short Term Goal 1 (Week 2): =LTGs due to ELOS  Skilled Therapeutic Interventions/Progress Updates:   Pt supine upon arrival and agreeable to therapy, no c/o pain. Max cues to orient to time, place, and situation. Mod assist to roll in bed to place maximove sling, total assist transfer to tilt-in-space w/c. Increased time for transfer required 2/2 pt's impaired awareness, attention, and motor perseverations. Total assist w/c transport to/from gym. Max visual, verbal, and tactile cues to reposition in chair, pt w/ increased L lateral lean and required mirror to reorient to midline. Worked on cognitive tasks sitting in w/c w/ emphasis on anterior weight shifting to complete task w/ bilateral UEs. Pt has decreased anterior weight shifting and hip flexion w/ all functional movements including slide board transfers. Cognitive tasks include word finding while following 1-2 step commands/directions w/ mod cues and medication sorting task w/ max-total cues to complete. Frequent cues to attend to task in distracting environment. Pt c/o neck pain as detailed below, RN present to provide pain medication and pain relieved when tilted back in w/c for a few minutes. Returned to room and ended session in tilt-in-space, seat belt on, call bell within reach and all needs met.   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   Therapy/Group: Individual Therapy  [name] 08/04/2017, 9:34 AM

## 2017-08-04 NOTE — Patient Care Conference (Signed)
Inpatient RehabilitationTeam Conference and Plan of Care Update Date: 08/13/2017   Time: 2:20 PM    Patient Name: Justin Stone      Medical Record Number: [id number]  Date of Birth: [birth year] Sex: Male         Room/Bed: 4M05C/4M05C-01 Payor Info: Payor: [employer] programmer / Plan: [employer] MEDICARE / Product Type: *No Product type* /    Admitting Diagnosis: bi lateral  bka  Admit Date/Time:  07/25/2017  3:05 PM Admission Comments: No comment available   Primary Diagnosis:  <principal problem not specified> Principal Problem: <principal problem not specified>  Patient Active Problem List   Diagnosis Date Noted  . Confusion, postoperative   . Hypokalemia   . Loose stools   . Post-operative pain   . Type 2 diabetes mellitus with peripheral neuropathy (HCC)   . Amputation of left lower extremity below knee (HCC) 07/25/2017  . S/P bilateral BKA (below knee amputation) (HCC)   . Unilateral complete BKA, left, sequela (HCC)   . Chronic pain syndrome   . Benign essential HTN   . Leukocytosis   . Acute blood loss anemia   . Tachycardia   . Hyponatremia   . AKI (acute kidney injury) (HCC) 07/22/2017  . Osteomyelitis of left foot (HCC)   . Diabetic wet gangrene of the foot (HCC) 07/21/2017  . Gangrene of left foot (HCC) 07/21/2017  . Unilateral complete BKA, left, initial encounter (HCC) 07/21/2017  . Insomnia 05/13/2016  . Umbilical hernia without obstruction and without gangrene 05/13/2016  . Spondylosis of lumbar region without myelopathy or radiculopathy 07/22/2015  . Obesity 06/29/2015  . 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 Type II diabetes mellitus, well controlled (HCC) 03/26/2015  . S/P BKA (below knee amputation), right (HCC) 03/26/2015  . Alcoholic cirrhosis of liver without ascites (HCC) 02/22/2015  . Chronic hepatitis C without hepatic coma (HCC) 02/22/2015  . Chronic low back pain 12/29/2014  . Essential hypertension, benign  12/29/2014  . Hyperlipidemia LDL goal <100 12/29/2014    Expected Discharge Date: Expected Discharge Date: 08/06/17  Team Members Present: Physician leading conference: Dr. Maryla Morrow Social Worker Present: Dossie Der, LCSW Nurse Present: Allayne Stack, RN PT Present: Midge Minium, PT OT Present: Perrin Maltese, OT SLP Present: Jackalyn Lombard, SLP PPS Coordinator present : Tora Duck, RN, CRRN     Current Status/Progress Goal Weekly Team Focus  Medical   Decreased functional mobility secondary to left BKA 07/21/2017 as well as history of right BKA.   Improve mobility, safety, transfers, CBGs, cognition  See above   Bowel/Bladder   Incontinent of B/B.  Throws feces.  Regain continence with min assist  Assist with timed toileting.   Swallow/Nutrition/ Hydration             ADL's   Mod to max assist for all UB selfcare with total assist for LB selfcare supine to sit EOB.  Total assist for sliding board transfers with use of the maximove at this time secondary to severely impaired cognition  downgraded to min assist overall  selfcare retraining, transfer training, balance retraining, functional transfers, pt/family education, cognitive retraining.     Mobility   max-total assist overall, using maximove for transfers, mobility limited by impaired attention, awareness, initiation, and motor planning  Mod I downgraded to mod assist overall w/c level on 08/02/17  cognitive remediation, carry-over of transfer technique, discharge planning, static sitting balance   Communication  Safety/Cognition/ Behavioral Observations  max to total assist   min assist for basic   orientation, basic recall of daily information, memory notebook, safety awareness    Pain   C/o mild pain, unable to give score  <3/10  Assess Qshift and PRN   Skin   Unstaged pressure wound on R stump with foam in place.  Prevent skin breakdown and infection  Assess Qshift and PRN      *See Care Plan and progress  notes for long and short-term goals.     Barriers to Discharge  Current Status/Progress Possible Resolutions Date Resolved   Physician    Decreased caregiver support;Medical stability;Lack of/limited family support;Weight bearing restrictions     See above  Therapies, follow labs, optimize DM/BP meds, workup for AMS      Nursing                  PT  Decreased caregiver support;Lack of/limited family support;Home environment access/layout;Medication compliance;Weight bearing restrictions;Behavior  Declining cognition over last week since eval, requires max-total assist 2/2 w/ transfers 2/2 cognition              OT                  SLP                SW                Discharge Planning/Teaching Needs:  Due to confusion and orientation issues not able to be mod/i and therapy team downgraded goals with require 24 hr physical care. Not able to go home and will need to go NH from here.      Team Discussion:  Pt has declined in function since last week. MD aware and looking into labs and adjusting medcines. Speech eval completed and will follow. Partial ileus MD working on. Cognition main issue that is limiting him in therapies. Attention, orientation, initiation issues. Team has downgraded all goals to min-mod level of assist. Will need to go NH unless improves greatly  Revisions to Treatment Plan:  Ques NHP    Continued Need for Acute Rehabilitation Level of Care: The patient requires daily medical management by a physician with specialized training in physical medicine and rehabilitation for the following conditions: Daily direction of a multidisciplinary physical rehabilitation program to ensure safe treatment while eliciting the highest outcome that is of practical value to the patient.: Yes Daily medical management of patient stability for increased activity during participation in an intensive rehabilitation regime.: Yes Daily analysis of laboratory values and/or radiology reports  with any subsequent need for medication adjustment of medical intervention for : Post surgical problems;Diabetes problems;Wound care problems;Other  DupreeLemar Livings, Sullivan Blasing G 08/04/2017, 8:28 AM

## 2017-08-04 NOTE — Progress Notes (Signed)
[facility]     PROGRESS NOTE  Subjective/Complaints:  Patient seen lying in bed this morning. He remains confused and confabulates. He is refusing lab work. Long discussion with patient, but patient is tangential and is not willing to be stuck for lab draw. I had a prolonged discussion with patient's older brother yesterday regarding current medical state.  ROS: Unreliable due to cognition.  Objective: Vital Signs: Blood pressure (!) 150/73, pulse (!) 102, temperature 98.1 F (36.7 C), temperature source Oral, resp. rate 20, height 5\' 7"  (1.702 m), weight 114 kg (251 lb 5.2 oz), SpO2 100 %. No results found. Recent Labs    08/03/17 0549  WBC 7.5  HGB 9.3*  HCT 30.1*  PLT 186   Recent Labs    08/03/17 0549  NA 139  K 2.8*  CL 111  GLUCOSE 132*  BUN 10  CREATININE 0.87  CALCIUM 9.0   CBG (last 3)  Recent Labs    08/03/17 1651 08/03/17 2116 08/04/17 0647  GLUCAP 139* 124* 143*    Wt Readings from Last 3 Encounters:  08/03/17 114 kg (251 lb 5.2 oz)  07/21/17 113.4 kg (250 lb)  07/21/17 113.8 kg (250 lb 12.8 oz)    Physical Exam:  BP (!) 150/73 (BP Location: Left Arm)   Pulse (!) 102   Temp 98.1 F (36.7 C) (Oral)   Resp 20   Ht 5\' 7"  (1.702 m)   Wt 114 kg (251 lb 5.2 oz)   SpO2 100%   BMI 39.36 kg/m  General: NAD. Vitals stable. HENT: Normocephalic. Atraumatic Eyes: EOMI. No discharge Cardiac. RRR.  No JVD. Respiratory. Clear to auscultation. Unlabored.  Abdomen: good bowel sounds. nondistended Musculoskeletal: Bilateral BKA Neurological. Alert and oriented to person only Followed full commands. Motor: B/l UE 5/5 proximal to distal LLE: HF 4-/5 (stable) RLE: HF, KE 4+/5 Skin. Left BKA with dressing c/d/i  Right BKA with small ulcer area at base of stump. Psych: Confabulation  Assessment/Plan: 1. Functional deficits secondary to bilateral BKA which require 3+ hours per day of interdisciplinary therapy in a  comprehensive inpatient rehab setting. Physiatrist is providing close team supervision and 24 hour management of active medical problems listed below. Physiatrist and rehab team continue to assess barriers to discharge/monitor patient progress toward functional and medical goals.  Function:  Bathing Bathing position   Position: Sitting EOB  Bathing parts Body parts bathed by patient: Right arm, Left arm, Chest, Abdomen, Right upper leg, Left upper leg Body parts bathed by helper: Front perineal area, Buttocks, Back  Bathing assist Assist Level: Touching or steadying assistance(Pt > 75%)      Upper Body Dressing/Undressing Upper body dressing   What is the patient wearing?: Hospital gown     Pull over shirt/dress - Perfomed by patient: Thread/unthread right sleeve, Thread/unthread left sleeve, Put head through opening, Pull shirt over trunk Pull over shirt/dress - Perfomed by helper: Pull shirt over trunk        Upper body assist Assist Level: Supervision or verbal cues      Lower Body Dressing/Undressing Lower body dressing   What is the patient wearing?: Pants     Pants- Performed by patient: Thread/unthread left pants leg Pants- Performed by helper: Thread/unthread right pants leg, Thread/unthread left pants leg, Pull pants up/down                      Lower body assist Assist for lower body dressing: Touching  or steadying assistance (Pt > 75%)      Toileting Toileting   Toileting steps completed by patient: Performs perineal hygiene Toileting steps completed by helper: Adjust clothing prior to toileting, Performs perineal hygiene, Adjust clothing after toileting Toileting Assistive Devices: Grab bar or rail  Toileting assist Assist level: Touching or steadying assistance (Pt.75%)   Transfers Chair/bed transfer   Chair/bed transfer method: Lateral scoot Chair/bed transfer assist level: Moderate assist (Pt 50 - 74%/lift or lower) Chair/bed transfer assistive  device: Sliding board Mechanical lift: Maximove   Locomotion Ambulation Ambulation activity did not occur: Safety/medical concerns         Wheelchair   Type: Manual Max wheelchair distance: 100' Assist Level: Supervision or verbal cues  Cognition Comprehension Comprehension assist level: Understands basic 50 - 74% of the time/ requires cueing 25 - 49% of the time  Expression Expression assist level: Expresses basic 25 - 49% of the time/requires cueing 50 - 75% of the time. Uses single words/gestures.  Social Interaction Social Interaction assist level: Interacts appropriately 25 - 49% of time - Needs frequent redirection.  Problem Solving Problem solving assist level: Solves basic less than 25% of the time - needs direction nearly all the time or does not effectively solve problems and may need a restraint for safety  Memory Memory assist level: Recognizes or recalls less than 25% of the time/requires cueing greater than 75% of the time    Medical Problem List and Plan: 1.  Decreased functional mobility secondary to left BKA 07/21/2017 as well as history of right BKA. Patient does use a right prosthesis   Cont CIR  2.  DVT Prophylaxis/Anticoagulation: Subcutaneous Lovenox. Monitor for any bleeding episodes 3. Pain Management/chronic pain: Soma 350 mg 3 times a day, Neurontin 1200 mg 3 times a day, MS Contin 15 mg every 12 hours, Robaxin and oxycodone as needed 4. Mood: Effexor 75 mg twice a day Trazodone 25 mg daily at bedtime as needed 5. Neuropsych: This patient is capable of making decisions on his own behalf. 6. Skin/Wound Care: Routine skin checks 7. Fluids/Electrolytes/Nutrition: Routine I&O's   Cr. 0.87 on 1/9 8. Acute blood loss anemia. Continue iron supplement.    Hb 9.3 on 1/9   Hemoccult stools positive on 1/9   Cont to monitor 9. Diabetes mellitus and peripheral neuropathy. Hemoglobin A1c 6.5. Currently with SSI. Check blood sugars before meals and at bedtime. Diabetic  teaching. Patient on Glucophage 500 mg twice a day prior to admission. Resume as needed   Metformin 500 daily started on 1/2, d/ced due to watery stool   Monitor with increased mobility CBG (last 3)  Recent Labs    08/03/17 1651 08/03/17 2116 08/04/17 0647  GLUCAP 139* 124* 143*    Relatively controlled on 1/10 10. Hypertension. Norvasc 10 mg daily.   Hydralazine 10 TID started on 1/9, increased to 25 on 1/10 11. Hyperlipidemia. Zocor 12.  Ileus   Pt also recently started on metformin will hold for now   KUB ordered13. Hyponatremia   Na 139 on 1/9   Cont to monitor 13. Leukocytosis: Resolved   Afebrile      WBCs 7.5 on 1/9   Cont to monitor 14.  RUE swelling negative duplex doppler 1/5 15.  Confusion   Likely multifactorial with metabolic +/- wernikes encephalopathy   D/ced soma and changed oxyIR to Q6   Gabapentin reduced to 600mg  TID, d/ced on 1/9   MS contin changed to tramadol   LFTs pending, patient  refusing labs   Ammonia elevated on 1/9, lactulose started   UA, U culture ordered   CXR ordered   Will speak to neurology 16. Hypokalemia   K 2.8 on 1/9   Supplement increased x2 days   Patient refusing labs  LOS (Days) 10 A FACE TO FACE EVALUATION WAS PERFORMED  Norely Schlick Karis Jubanil Marcella Dunnaway 08/04/2017 8:22 AM

## 2017-08-04 NOTE — Progress Notes (Signed)
Pt alert oriented to self not speaking coherently. Pt refusing all meds and care. Refused blood sugar check. Attempted to expalin risks and benefits however pt demonstrates no evidence of learning. Will attempt to give at later time.

## 2017-08-04 NOTE — Progress Notes (Signed)
Occupational Therapy Session Note  Patient Details  Name: Justin Stone MRN: [id number] Date of Birth: January 29, 1961  Today's Date: 08/04/2017 OT Individual Time: 1300-1401 OT Individual Time Calculation (min): 61 min    Short Term Goals: Week 2:  OT Short Term Goal 1 (Week 2): Pt will complete all UB and LB bathing sitting with lateral leans side to side and min assist.   OT Short Term Goal 2 (Week 2): Pt will donn pull up shorts or pants with min assist and lateral leans side to side.  OT Short Term Goal 3 (Week 2): Pt will complete 3 grooming tasks from wheelchair level at the sink with supervision and no more than min instructional cueing.   OT Short Term Goal 4 (Week 2): Pt iwll perform toilet transfer with min assist using sliding board to drop arm commode.    Skilled Therapeutic Interventions/Progress Updates:    Pt still extremely confused during session.  Not oriented to place, time, or situation.  He was noted to have severe bowel incontinence to start session.  Pt unaware and when therapist would attempt to help clean him up, pt was resistant.  He would state he wanted to do it himself, but then would only continue to talk confabulation and not attempt to clean. Total assist +3 for rolling pt to the side and cleaning peri area, secondary to pt's poor awareness and resistant to participate.  Pt hallucinating frequently during session, seeing his wife in the room as well as other people not present.  Non productive session secondary to pt's cognition and awareness.  Pt left in bed with bed alarm in place and call button and phone in reach.    Therapy Documentation Precautions:  Precautions Precautions: Fall Required Braces or Orthoses: Other Brace/Splint Other Brace/Splint: Has LLE splint for positioning of amputation Restrictions Weight Bearing Restrictions: Yes LLE Weight Bearing: Non weight bearing Other Position/Activity Restrictions: has R prosthtic leg for R BKA (OLD); L BKA  (NEW)  Pain: Pain Assessment Pain Assessment: Faces Faces Pain Scale: Hurts little more Pain Type: Acute pain Pain Location: Buttocks Pain Descriptors / Indicators: Burning Pain Intervention(s): Repositioned ADL: See Function Navigator for Current Functional Status.   Therapy/Group: Individual Therapy  Litha Lamartina OTR/L 08/04/2017, 4:05 PM

## 2017-08-04 NOTE — Consult Note (Signed)
NEURO HOSPITALIST CONSULT NOTE   Requestig physician: Dr. Posey Pronto   Reason for Consult: Confusion   History obtained from: Chart  HPI:                                                                                                                                          Justin Stone is an 57 y.o. male  "with history of diabetes mellitus, chronic pain syndrome, hypertension, right BKA several years ago. History taken from chart review and patient. Patient lives alone. Independent with a cane prior to admission as well as right prosthesis. Presented 07/21/2017 with ulceration to left foot and no change with conservative care. X-rays of left foot consistent with cellulitis and osteomyelitis. Underwent left BKA 07/21/2017 per Dr. Doran Durand. Hospital course pain management. Intermittent bouts of confusion with narcotics adjusted."  Patient does have history of drinking alcohol along with depression.  She has been up on inpatient rehab is been noted that patient has been confused during his sessions, not oriented to time, place or situation.  While hospitalized he had his leg amputated but apparently the patient is not aware of this and denies this.  Although patient has been confused throughout the whole stay of his inpatient rehab it is been noted that he is not confabulating.  He is refusing lab draws and apparently is tangential in his thinking.  During consultation patient is awake, he believes that he is in a 2 bedroom apartment, he confabulates that he has to look over the children.  He is calm, he is not aware of what state he is in however he is able to name my thumb my small finger and name my watch.  He is able to follow simple commands.  Upon leaving the room he recalled that he was watching TV and asked me to put on a specific channel and turn off the lights.  When asking about his drinking history he could not tell me his drinking history other than the fact that he did  drink as a kid.  Apparently Irving Burton had called family and they could not state how much he drank because he lives alone.  Patient has Artie been started on thiamine which is now been changed to high-dose thiamine for 3 days and then go back to normal dose thiamine.  Ammonia 48,  Of note patient's sodium has been low at 127 (7 days ago, 6 days ago, 4 days ago,) and yesterday was noted to be 139 Patient's potassium is 2.8  Hemoglobin has been trending down from 11.7-9.3, hematocrit has also been trending down from 35.2-30  Repeat urine culture has been ordered  There is been no cranial images either CT or MRI or EEG--CIR wanted to consult neurology prior to getting the studies.  Sedating medications have been slowly decreased to [place].  Of these they include:  Neurontin which was stopped yesterday MS Contin 15 mg every 12 hours which was stopped 3 days ago Oxycodone which was DC'd 3 days ago Tramadol 50 mg every 6 hours as needed was started 3 days ago.    Past Medical History:  Diagnosis Date  . Allergy    eye allergies  . Anxiety   . Arthritis   . Diabetes mellitus without complication ([place])    takes oral meds now only  . GERD (gastroesophageal reflux disease)    past hx > 2 yrs ago  . Hepatitis    "Hepatitis C"- tx. with Harvoni- now testing negative.  . Hyperlipidemia   . Hypertension   . Neuromuscular disorder ([facility])    neuropathy hands/ feet.  . Osteopenia   . Prosthesis adjustment    right leg- below knee(weighs 10 lbs.)    Past Surgical History:  Procedure Laterality Date  . AMPUTATION Left 07/21/2017   Procedure: LEFT  BELOW KNEE AMPUTATION;  Surgeon: Wylene Simmer, MD;  Location: [facility];  Service: Orthopedics;  Laterality: Left;  . HERNIA REPAIR Left    LIH  . INSERTION OF MESH N/A 06/15/2016   Procedure: INSERTION OF MESH;  Surgeon: Greer Pickerel, MD;  Location: [facility];  Service: General;  Laterality: N/A;  . right leg removal     for diabetes, wears prosthesis  -Right Below knee since '15  . UMBILICAL HERNIA REPAIR N/A 06/15/2016   Procedure: LAPAROSCOPIC ASSISTED REPAIR OF  UMBILICAL HERNIA;  Surgeon: Greer Pickerel, MD;  Location: [facility];  Service: General;  Laterality: N/A;    Family History  Problem Relation Age of Onset  . Diabetes Maternal Uncle   . Diabetes Maternal Grandmother   . Colon cancer Neg Hx   . Colon polyps Neg Hx   . Rectal cancer Neg Hx   . Stomach cancer Neg Hx   . Esophageal cancer Neg Hx   . Sickle cell anemia Neg Hx       Social History:  reports that he quit smoking about 31 years ago. His smoking use included cigarettes. He has a 20.00 pack-year smoking history. he has never used smokeless tobacco. He reports that he drinks about 4.8 oz of alcohol per week. He reports that he does not use drugs.  No Known Allergies  MEDICATIONS:                                                                                                                     Scheduled: . amLODipine  10 mg Oral Daily  . aspirin EC  81 mg Oral Daily  . docusate sodium  100 mg Oral BID  . enoxaparin (LOVENOX) injection  40 mg Subcutaneous Q24H  . ferrous sulfate  325 mg Oral BID WC  . Gerhardt's butt cream   Topical BID  . hydrALAZINE  25 mg Oral Q8H  . insulin aspart  0-15 Units Subcutaneous TID WC  .  ketotifen  1 drop Both Eyes BID  . lactulose  20 g Oral BID  . potassium chloride  40 mEq Oral BID  . senna-docusate  2 tablet Oral BID  . simvastatin  10 mg Oral QHS  . sodium phosphate  1 enema Rectal Once  . thiamine  100 mg Oral Daily  . venlafaxine  75 mg Oral BID WC     ROS:                                                                                                                                       History obtained from the patient  General ROS: negative for - chills, fatigue, fever, night sweats, weight gain or weight loss Psychological ROS: negative for - behavioral disorder, hallucinations, memory difficulties, mood swings  or suicidal ideation Ophthalmic ROS: negative for - blurry vision, double vision, eye pain or loss of vision ENT ROS: negative for - epistaxis, nasal discharge, oral lesions, sore throat, tinnitus or vertigo Allergy and Immunology ROS: negative for - hives or itchy/watery eyes Hematological and Lymphatic ROS: negative for - bleeding problems, bruising or swollen lymph nodes Endocrine ROS: negative for - galactorrhea, hair pattern changes, polydipsia/polyuria or temperature intolerance Respiratory ROS: negative for - cough, hemoptysis, shortness of breath or wheezing Cardiovascular ROS: negative for - chest pain, dyspnea on exertion, edema or irregular heartbeat Gastrointestinal ROS: negative for - abdominal pain, diarrhea, hematemesis, nausea/vomiting or stool incontinence Genito-Urinary ROS: negative for - dysuria, hematuria, incontinence or urinary frequency/urgency Musculoskeletal ROS: negative for - joint swelling or muscular weakness Neurological ROS: as noted in HPI Dermatological ROS: negative for rash and skin lesion changes   Blood pressure (!) 150/73, pulse (!) 102, temperature 98.1 F (36.7 C), temperature source Oral, resp. rate 20, height _0  (1.702 m), weight 114 kg (251 lb 5.2 oz), SpO2 100 %.   Neurologic Examination:                                                                                                      HEENT-  Normocephalic, no lesions, without obvious abnormality.  Normal external eye and conjunctiva.  Normal TM's bilaterally.  Normal auditory canals and external ears. Normal external nose, mucus membranes and septum.  Normal pharynx. Cardiovascular- S1, S2 normal, pulses palpable throughout   Lungs- chest clear, no wheezing, rales, normal symmetric air entry Abdomen- normal findings: bowel sounds normal Extremities- no edema Lymph-no adenopathy palpable Musculoskeletal-no joint tenderness, deformity or swelling Skin-warm and dry,  no hyperpigmentation,  vitiligo, or suspicious lesions  Neurological Examination Mental Status: Alert, not oriented as mentioned above, thought content not appropriate as he believes that he is in a 2 bedroom apartment and that he is taking care of children.Marland Kitchen  Speech fluent without evidence of aphasia.  Able to follow 3 step commands without difficulty. Cranial Nerves: II: Visual fields grossly normal,  III,IV, VI: ptosis not present, extra-ocular motions intact bilaterally pupils equal, round, reactive to light and accommodation--I did not see any nystagmus other than far gaze nystagmus. V,VII: smile symmetric, facial light touch sensation normal bilaterally VIII: hearing normal bilaterally IX,X: uvula rises symmetrically XI: bilateral shoulder shrug XII: midline tongue extension Motor: Right : Upper extremity   5/5    Left:     Upper extremity   5/5  Bilateral lower extremity amputation below knee with hip flexion 5/5 --Bilateral upper extremities did not show any ataxia or dysmetria Tone and bulk:normal tone throughout; no atrophy noted Sensory: Pinprick and light touch intact throughout, bilaterally in the upper extremities with decreased sensation in the lower extremities Deep Tendon Reflexes: 1+ and symmetric throughout upper extremities with no knee jerk bilaterally Plantars: Lateral amputation below knee bilaterally Cerebellar: normal finger-to-nose,  Gait: Not tested      Lab Results: Basic Metabolic Panel: Recent Labs  Lab 07/29/17 0846 07/31/17 0530 08/01/17 0605 08/03/17 0549  NA 127* 127*  --  139  K 3.8 3.7  --  2.8*  CL 98* 98*  --  111  CO2 18* 16*  --  19*  GLUCOSE 160* 135*  --  132*  BUN 29* 27*  --  10  CREATININE 1.40* 1.25* 1.15 0.87  CALCIUM 9.3 9.5  --  9.0    Liver Function Tests: No results for input(s): AST, ALT, ALKPHOS, BILITOT, PROT, ALBUMIN in the last 168 hours. No results for input(s): LIPASE, AMYLASE in the last 168 hours. Recent Labs  Lab 08/03/17 1209   AMMONIA 48*    CBC: Recent Labs  Lab 07/31/17 0530 08/03/17 0549  WBC 11.7* 7.5  NEUTROABS 8.4* 5.3  HGB 11.2* 9.3*  HCT 34.9* 30.1*  MCV 97.2 97.1  PLT 317 186    Cardiac Enzymes: No results for input(s): CKTOTAL, CKMB, CKMBINDEX, TROPONINI in the last 168 hours.  Lipid Panel: No results for input(s): CHOL, TRIG, HDL, CHOLHDL, VLDL, LDLCALC in the last 168 hours.  CBG: Recent Labs  Lab 08/03/17 0611 08/03/17 1149 08/03/17 1651 08/03/17 2116 08/04/17 0647  GLUCAP 146* 138* 139* 124* 143*    Microbiology: Results for orders placed or performed during the hospital encounter of 07/25/17  Culture, Urine     Status: None   Collection Time: 07/30/17 10:18 AM  Result Value Ref Range Status   Specimen Description URINE, CLEAN CATCH  Final   Special Requests NONE  Final   Culture NO GROWTH  Final   Report Status 07/31/2017 FINAL  Final    Coagulation Studies: No results for input(s): LABPROT, INR in the last 72 hours.  Imaging: No results found.     Assessment and plan per attending neurologist  Etta Quill PA-C [facility] [phone]  08/04/2017, 9:21 AM  I have seen the patient and reviewed the above note.  The patient's able to perform addition, able to spell world backwards, but has severe anterior grade amnesia with confabulation.  I step out of the room for 5-10 minutes, and when I returned and asked if we had met before, he said "yes  years ago, when I lived off of [address].".  He is able to have a reasonable conversation, and confabulates well, but the content of his conversation is factually inaccurate.  For instance, he denies his amputations.  I called his brother and spoke with him at length, he lives alone but there is suspicion that he was drinking as the brother would state that he sounded like he was "on something" when he would call at night.  In the mornings he sounded fine.  Also when his other brother was living with him 6 months  ago, he stated that he was drinking at that time.  He also states that there was a call about a week prior to his hospitalization where he did not sound quite like his normal self on the phone.  Reading the notes, there is discussion of patient being a "poor historian" and having difficulty recounting the events leading up to the hospitalization.  There is mention of "intermittent bouts of confusion with narcotics adjusted."  Assessment/Plan: 57 year old male with severe anterior grade amnesia and confabulation with relative preservation of other cognitive domains.  I am uncertain of the true chronicity of his complaints and think there may have been some mild confusion earlier than recognized.  He does have an elevated ammonia, and this could be contributing and I agree with treatment as such.  1) B12, a.m. Cortisol, TSH 2) blood cultures, UA 3) MRI brain 4) EEG 5) high-dose IV thiamine  Roland Rack, MD [facility] [phone]  If 7pm- 7am, please page neurology on call as listed in [place].

## 2017-08-04 NOTE — Progress Notes (Signed)
Occupational Therapy Note  Patient Details  Name: Justin Stone MRN: [id number] Date of Birth: 1961/05/22  Today's Date: 08/04/2017 OT Missed Time: 45 Minutes Missed Time Reason: X-Ray  Pt getting ready to transport for testing. Missed 45 min of therapy.   [name], [name] 08/04/2017, 11:58 AM

## 2017-08-04 NOTE — Progress Notes (Signed)
Social Work Patient ID: Rolla PlateSheldon C Saraceni, male   DOB: [birth year], 57 y.o.   MRN: [id number]  Spoke with Jonny RuizJohn Derrick-pt's brother via telephone to discuss his condition and plans. He has spoken with MD so is aware of the medical issues and a neurologist has been consulted to see today. At this time pt is not medically stable to go anywhere-[facility] or home. Will continue to follow and keep brother updated and work on a realistic discharge plan from here.

## 2017-08-05 ENCOUNTER — Inpatient Hospital Stay (HOSPITAL_COMMUNITY): Payer: Medicare Other | Admitting: Occupational Therapy

## 2017-08-05 ENCOUNTER — Other Ambulatory Visit: Payer: Self-pay

## 2017-08-05 ENCOUNTER — Inpatient Hospital Stay (HOSPITAL_COMMUNITY): Payer: Medicare Other

## 2017-08-05 ENCOUNTER — Inpatient Hospital Stay (HOSPITAL_COMMUNITY): Payer: Medicare Other | Admitting: Physical Therapy

## 2017-08-05 ENCOUNTER — Inpatient Hospital Stay (HOSPITAL_COMMUNITY): Payer: Medicare Other | Admitting: Speech Pathology

## 2017-08-05 DIAGNOSIS — G9340 Encephalopathy, unspecified: Secondary | ICD-10-CM

## 2017-08-05 LAB — CBC
HEMATOCRIT: 30.7 % — AB (ref 39.0–52.0)
Hemoglobin: 9.7 g/dL — ABNORMAL LOW (ref 13.0–17.0)
MCH: 31.1 pg (ref 26.0–34.0)
MCHC: 31.6 g/dL (ref 30.0–36.0)
MCV: 98.4 fL (ref 78.0–100.0)
Platelets: 151 10*3/uL (ref 150–400)
RBC: 3.12 MIL/uL — AB (ref 4.22–5.81)
RDW: 16 % — ABNORMAL HIGH (ref 11.5–15.5)
WBC: 6.7 10*3/uL (ref 4.0–10.5)

## 2017-08-05 LAB — BASIC METABOLIC PANEL
ANION GAP: 9 (ref 5–15)
Anion gap: 8 (ref 5–15)
BUN: 5 mg/dL — ABNORMAL LOW (ref 6–20)
CHLORIDE: 114 mmol/L — AB (ref 101–111)
CO2: 18 mmol/L — AB (ref 22–32)
CO2: 19 mmol/L — ABNORMAL LOW (ref 22–32)
CREATININE: 0.88 mg/dL (ref 0.61–1.24)
Calcium: 9.1 mg/dL (ref 8.9–10.3)
Calcium: 8.9 mg/dL (ref 8.9–10.3)
Chloride: 113 mmol/L — ABNORMAL HIGH (ref 101–111)
Creatinine, Ser: 0.88 mg/dL (ref 0.61–1.24)
GFR calc Af Amer: >60 mL/min (ref >60)
GFR calc Af Amer: >60 mL/min (ref >60)
GFR calc non Af Amer: >60 mL/min (ref >60)
GLUCOSE: 117 mg/dL — AB (ref 65–99)
Glucose, Bld: 153 mg/dL — ABNORMAL HIGH (ref 65–99)
Potassium: 2.4 mmol/L — CL (ref 3.5–5.1)
Potassium: 2.7 mmol/L — CL (ref 3.5–5.1)
SODIUM: 141 mmol/L (ref 135–145)
Sodium: 140 mmol/L (ref 135–145)

## 2017-08-05 LAB — GLUCOSE, CAPILLARY
GLUCOSE-CAPILLARY: 98 mg/dL (ref 65–99)
Glucose-Capillary: 113 mg/dL — ABNORMAL HIGH (ref 65–99)
Glucose-Capillary: 148 mg/dL — ABNORMAL HIGH (ref 65–99)

## 2017-08-05 LAB — MAGNESIUM: MAGNESIUM: 1.4 mg/dL — AB (ref 1.7–2.4)

## 2017-08-05 LAB — VITAMIN B12: Vitamin B-12: 1061 pg/mL — ABNORMAL HIGH (ref 180–914)

## 2017-08-05 LAB — CORTISOL-AM, BLOOD: CORTISOL - AM: 8.7 ug/dL (ref 6.7–22.6)

## 2017-08-05 MED ORDER — LORAZEPAM 2 MG/ML IJ SOLN
0.5000 mg | INTRAMUSCULAR | Status: DC | PRN
  Administered 2017-08-05: 0.5 mg via INTRAVENOUS

## 2017-08-05 MED ORDER — POTASSIUM CHLORIDE 10 MEQ/50ML IV SOLN
10.0000 meq | INTRAVENOUS | Status: DC
  Filled 2017-08-05 (×2): qty 50

## 2017-08-05 MED ORDER — POTASSIUM CHLORIDE 10 MEQ/50ML IV SOLN
10.0000 meq | INTRAVENOUS | Status: DC | PRN
  Administered 2017-08-05: 10 meq via INTRAVENOUS
  Filled 2017-08-05: qty 50

## 2017-08-05 MED ORDER — LORAZEPAM 0.5 MG PO TABS
0.5000 mg | ORAL_TABLET | ORAL | Status: DC | PRN
  Administered 2017-08-13: 0.5 mg via ORAL
  Filled 2017-08-05: qty 1

## 2017-08-05 MED ORDER — LORAZEPAM 2 MG/ML IJ SOLN: 0.5000 mg | Freq: Once | INTRAMUSCULAR | Status: DC

## 2017-08-05 MED ORDER — SODIUM CHLORIDE 0.9% FLUSH: 10.0000 mL | INTRAVENOUS | Status: DC | PRN

## 2017-08-05 MED ORDER — SODIUM CHLORIDE 0.9% FLUSH
10.0000 mL | INTRAVENOUS | Status: DC | PRN
  Administered 2017-08-06 – 2017-08-09 (×3): 10 mL
  Filled 2017-08-05 (×3): qty 40

## 2017-08-05 MED ORDER — POTASSIUM CHLORIDE 10 MEQ/50ML IV SOLN
10.0000 meq | INTRAVENOUS | Status: AC
  Administered 2017-08-05 (×2): 10 meq via INTRAVENOUS
  Filled 2017-08-05 (×2): qty 50

## 2017-08-05 MED ORDER — LORAZEPAM 0.5 MG PO TABS: 0.5000 mg | ORAL_TABLET | ORAL | Status: DC | PRN

## 2017-08-05 MED ORDER — POTASSIUM CHLORIDE CRYS ER 20 MEQ PO TBCR: 30.0000 meq | EXTENDED_RELEASE_TABLET | Freq: Two times a day (BID) | ORAL | Status: DC

## 2017-08-05 MED ORDER — MAGNESIUM GLUCONATE 500 MG PO TABS
500.0000 mg | ORAL_TABLET | Freq: Every day | ORAL | Status: DC
  Administered 2017-08-06 – 2017-08-18 (×13): 500 mg via ORAL
  Filled 2017-08-05 (×13): qty 1

## 2017-08-05 MED ORDER — POTASSIUM CHLORIDE 10 MEQ/50ML IV SOLN
10.0000 meq | INTRAVENOUS | Status: AC
  Administered 2017-08-05 (×4): 10 meq via INTRAVENOUS
  Filled 2017-08-05 (×4): qty 50

## 2017-08-05 MED ORDER — LORAZEPAM 2 MG/ML IJ SOLN
0.5000 mg | INTRAMUSCULAR | Status: DC | PRN
  Filled 2017-08-05: qty 1

## 2017-08-05 MED ORDER — POTASSIUM CHLORIDE CRYS ER 20 MEQ PO TBCR
30.0000 meq | EXTENDED_RELEASE_TABLET | Freq: Two times a day (BID) | ORAL | Status: DC
  Administered 2017-08-05: 11:00:00 30 meq via ORAL
  Filled 2017-08-05 (×2): qty 1

## 2017-08-05 MED ORDER — MAGNESIUM SULFATE 2 GM/50ML IV SOLN
2.0000 g | Freq: Once | INTRAVENOUS | Status: AC
  Administered 2017-08-05: 2 g via INTRAVENOUS
  Filled 2017-08-05: qty 50

## 2017-08-05 MED ORDER — POTASSIUM CHLORIDE CRYS ER 20 MEQ PO TBCR
40.0000 meq | EXTENDED_RELEASE_TABLET | Freq: Two times a day (BID) | ORAL | Status: DC
  Administered 2017-08-05 – 2017-08-10 (×9): 40 meq via ORAL
  Filled 2017-08-05 (×10): qty 2

## 2017-08-05 NOTE — Progress Notes (Signed)
Peripherally Inserted Central Catheter/Midline Placement  The IV Nurse has discussed with the patient and/or persons authorized to consent for the patient, the purpose of this procedure and the potential benefits and risks involved with this procedure.  The benefits include less needle sticks, lab draws from the catheter, and the patient may be discharged home with the catheter. Risks include, but not limited to, infection, bleeding, blood clot (thrombus formation), and puncture of an artery; nerve damage and irregular heartbeat and possibility to perform a PICC exchange if needed/ordered by physician.  Alternatives to this procedure were also discussed.  Bard Power PICC patient education guide, fact sheet on infection prevention and patient information card has been provided to patient /or left at bedside.    PICC/Midline Placement Documentation  PICC Single Lumen 08/05/17 PICC Left Basilic 46 cm 0 cm (Active)  Indication for Insertion or Continuance of Line Poor Vasculature-patient has had multiple peripheral attempts or PIVs lasting less than 24 hours 08/05/2017  4:00 PM  Exposed Catheter (cm) 0 cm 08/05/2017  4:00 PM  Site Assessment Clean;Dry;Intact 08/05/2017  4:00 PM  Line Status Flushed;Blood return noted 08/05/2017  4:00 PM  Dressing Type Transparent 08/05/2017  4:00 PM  Dressing Status Clean;Dry;Intact;Antimicrobial disc in place 08/05/2017  4:00 PM  Dressing Intervention New dressing 08/05/2017  4:00 PM  Dressing Change Due 08/12/17 08/05/2017  4:00 PM    Telephone consent signed by brother   Maximino GreenlandLumban, Jacky Hartung Albarece 08/05/2017, 4:54 PM

## 2017-08-05 NOTE — Progress Notes (Signed)
Occupational Therapy Note  Patient Details  Name: Justin Stone MRN: [id number] Date of Birth: [birth year]  Pt missed 60 mins OT secondary to IV team working on starting central line and unsuccessful per report.  Pt now beginning to receive IV medications and still with severe confusion and decreased awareness.     Ardena Gangl OTR/L 08/05/2017, 1:50 PM

## 2017-08-05 NOTE — Procedures (Signed)
HPI:  57 y/o with MS change  TECHNICAL SUMMARY:  A multichannel referential and bipolar montage EEG using the standard international 10-20 system was performed on the patient described as poorly responsive.  When most awake, there is an 8-1/2-9 Hz occipital dominant rhythm.  5-6 Hz activity can be seen intermixed, especially amongst the frontal and temporal head regions.   Low voltage fast (beta) activity is distributed symmetrically and maximally over the anterior head regions.  ACTIVATION: Photic stimulation and hyperventilation are not performed.  EPILEPTIFORM ACTIVITY:  There were no spikes, sharp waves or paroxysmal activity.  SLEEP: Stage I and stage II sleep architecture are noted at the end of the recording.  CARDIAC:  The EKG lead revealed a  sinus rhythm at 100 bpm.  IMPRESSION:  This EEG demonstrated no focal, hemispheric, or lateralizing features.  There was a mild diffuse slowing of the electrocerebral activity which can be seen in a wide variety of encephalopathic states including those of a toxic, metabolic, or degenerative nature.  This can also be due to medication effect.  There was no epileptiform activity recorded on this EEG.  Correlate clinically.

## 2017-08-05 NOTE — Progress Notes (Signed)
Speech Language Pathology Daily Session Note  Patient Details  Name: Justin Stone MRN: [id number] Date of Birth: February 13, 1961  Today's Date: 08/05/2017 SLP Individual Time: 0800-0850 SLP Individual Time Calculation (min): 50 min  Short Term Goals: Week 1: SLP Short Term Goal 1 (Week 1): Pt will recall basic, daily information with mod verbal cues for use of external aids.   SLP Short Term Goal 2 (Week 1): Pt will sustain his attention to basic, functional tasks for 5 minute intervals with mod verbal cues for redirection.   SLP Short Term Goal 3 (Week 1): Pt will complete basic, familiar tasks with mod assist for functional problem solving.   SLP Short Term Goal 4 (Week 1): Pt will recognize and correct errors when completing basic, familiar tasks with max assist.    Skilled Therapeutic Interventions:  Pt was seen for skilled ST targeting cognitive goals.  Pt needed mod-max assist verbal cues to utilize environmental aids in his room to reorient to place and date.  He needed max assist verbal cues to initiate and complete feeding himself his breakfast meal in a timely manner as pt would often take one sip or spoonful of broth and then stare off into space or become distracted by items in his bed or on his tray.  Pt's timeliness and task sequencing improved with task induced time constraints as evidenced by therapist able to fade cues to mod assist for redirection.  Pt needed min assist following set up to complete oral care after completing his meal.  Pt then needed max assist verbal cues for immediate recall of 2 actiivities completed during therapy session to record in his memory notebook.  Therapy session was ended early as transport arrived to take pt to EEG.  Pt was left in bed with nursing and transport at bedside.  Continue per current plan of care.    Function:  Eating Eating     Eating Assist Level: Set up assist for;Supervision or verbal cues   Eating Set Up Assist For: Opening  containers       Cognition Comprehension Comprehension assist level: Understands basic 25 - 49% of the time/ requires cueing 50 - 75% of the time  Expression   Expression assist level: Expresses basic 25 - 49% of the time/requires cueing 50 - 75% of the time. Uses single words/gestures.  Social Interaction Social Interaction assist level: Interacts appropriately 25 - 49% of time - Needs frequent redirection.  Problem Solving Problem solving assist level: Solves basic 25 - 49% of the time - needs direction more than half the time to initiate, plan or complete simple activities  Memory Memory assist level: Recognizes or recalls less than 25% of the time/requires cueing greater than 75% of the time    Pain Pain Assessment Pain Assessment: No/denies pain  Therapy/Group: Individual Therapy  Zanita Millman, [name] 08/05/2017, 11:41 AM

## 2017-08-05 NOTE — Progress Notes (Signed)
Subjective: NO events overnight. Poor sleep.   Exam: Vitals:   08/04/17 1441 08/05/17 0700  BP: (!) 148/81 (!) 161/87  Pulse: (!) 105 98  Resp: 18 18  Temp: 97.9 F (36.6 C) (!) 97.4 F (36.3 C)  SpO2: 98% 98%   Gen: In bed, NAD Resp: non-labored breathing, no acute distress Abd: soft, nt  Neuro: MS: awake, alert, oriented to "hospital" but not able to tell me then name, state, month or year. Continues to confabuilate.  ZO:XWRUCN:face symmetric, EOMI Motor: moves all extrmeities well; Sensory:intact to LT  Pertinent Labs: b12 1061 Ammonia 48  Impression: 57 yo M with anterograde amnesia and relatively preserved otherwise. I agree with treating elevated ammonia, but unusual for hepatic encephalopathy. I remain concerned for possible wernicke/korsakoff picture. Still possible this is multi-factorial delirium.   Recommendations: 1) MRI brain 2) agree with lactulose 3) high dose thiamine.  4) low dose seroquel at night to help maintain sleep/wake cycle.   Ritta SlotMcNeill Tamu Golz, MD [facility] [phone]  If 7pm- 7am, please page neurology on call as listed in [facility].

## 2017-08-05 NOTE — Progress Notes (Signed)
Patient continues to be hypokalemic EKG showed normal sinus rhythm some suspect ST and T-wave abnormalities cardiology services consulted for any recommendations and ongoing care

## 2017-08-05 NOTE — Progress Notes (Signed)
[place] PHYSICAL MEDICINE & REHABILITATION     PROGRESS NOTE  Subjective/Complaints:  Patient seen lying in bed this morning. He remains confused and confabulates.  ROS: Unreliable due to cognition.  Objective: Vital Signs: Blood pressure (!) 161/87, pulse 98, temperature (!) 97.4 F (36.3 C), temperature source Oral, resp. rate 18, height 5\' 7"  (1.702 m), weight 114 kg (251 lb 5.2 oz), SpO2 98 %. Dg Chest 2 View  Result Date: 08/04/2017 CLINICAL DATA:  Infection EXAM: CHEST  2 VIEW COMPARISON:  07/06/2017 FINDINGS: Heart is enlarged. Right basilar opacity could reflect atelectasis or infiltrate. Left lung is clear. No effusions or acute bony abnormality. IMPRESSION: Cardiomegaly. Concern for focal right basilar infiltrate or atelectasis. Electronically Signed   By: Charlett Nose M.D.   On: 08/04/2017 11:02   Dg Abd 1 View  Result Date: 08/04/2017 CLINICAL DATA:  Abdominal distension EXAM: ABDOMEN - 1 VIEW COMPARISON:  07/31/2017 FINDINGS: Scattered large and small bowel gas is noted. No obstructive changes are noted. The overall appearance has improved slightly in the interval from the prior exam. Degenerative changes of the lumbar spine are seen. No acute bony abnormality is noted. IMPRESSION: Decrease in the degree of gaseous distention. No acute abnormality noted. Electronically Signed   By: Alcide Clever M.D.   On: 08/04/2017 11:07   Recent Labs    08/03/17 0549 08/05/17 0156  WBC 7.5 6.7  HGB 9.3* 9.7*  HCT 30.1* 30.7*  PLT 186 151   Recent Labs    08/03/17 0549 08/05/17 0156  NA 139 140  K 2.8* 2.4*  CL 111 113*  GLUCOSE 132* 117*  BUN 10 5*  CREATININE 0.87 0.88  CALCIUM 9.0 9.1   CBG (last 3)  Recent Labs    08/04/17 1649 08/04/17 2313 08/05/17 0634  GLUCAP 125* 137* 113*    Wt Readings from Last 3 Encounters:  08/03/17 114 kg (251 lb 5.2 oz)  07/21/17 113.4 kg (250 lb)  07/21/17 113.8 kg (250 lb 12.8 oz)    Physical Exam:  BP (!) 161/87 (BP  Location: Left Arm)   Pulse 98   Temp (!) 97.4 F (36.3 C) (Oral)   Resp 18   Ht 5\' 7"  (1.702 m)   Wt 114 kg (251 lb 5.2 oz)   SpO2 98%   BMI 39.36 kg/m  General: NAD. Vitals stable. HENT: Normocephalic. Atraumatic Eyes: EOMI. No discharge Cardiac. RRRR.  No JVD. Respiratory. Clear to auscultation. Unlabored.  Abdomen: good bowel sounds. nondistended Musculoskeletal: Bilateral BKA Neurological. Alert  Followed full commands. Motor: B/l UE 5/5 proximal to distal LLE: HF 4-/5 (stable) RLE: HF, KE 4+/5 Skin. Left BKA with staples, slight dusky appearance but intact  Right BKA with small ulcer area at base of stump. Psych: Confabulation  Assessment/Plan: 1. Functional deficits secondary to bilateral BKA which require 3+ hours per day of interdisciplinary therapy in a comprehensive inpatient rehab setting. Physiatrist is providing close team supervision and 24 hour management of active medical problems listed below. Physiatrist and rehab team continue to assess barriers to discharge/monitor patient progress toward functional and medical goals.  Function:  Bathing Bathing position   Position: Sitting EOB  Bathing parts Body parts bathed by patient: Right arm, Left arm, Chest, Abdomen, Right upper leg, Left upper leg Body parts bathed by helper: Front perineal area, Buttocks, Back  Bathing assist Assist Level: Touching or steadying assistance(Pt > 75%)      Upper Body Dressing/Undressing Upper body dressing   What is  the patient wearing?: Hospital gown     Pull over shirt/dress - Perfomed by patient: Thread/unthread right sleeve, Thread/unthread left sleeve, Put head through opening, Pull shirt over trunk Pull over shirt/dress - Perfomed by helper: Pull shirt over trunk        Upper body assist Assist Level: Supervision or verbal cues      Lower Body Dressing/Undressing Lower body dressing   What is the patient wearing?: Pants     Pants- Performed by patient:  Thread/unthread left pants leg Pants- Performed by helper: Thread/unthread right pants leg, Thread/unthread left pants leg, Pull pants up/down                      Lower body assist Assist for lower body dressing: Touching or steadying assistance (Pt > 75%)      Toileting Toileting   Toileting steps completed by patient: Performs perineal hygiene Toileting steps completed by helper: Adjust clothing prior to toileting, Performs perineal hygiene, Adjust clothing after toileting Toileting Assistive Devices: Grab bar or rail  Toileting assist Assist level: Touching or steadying assistance (Pt.75%)   Transfers Chair/bed transfer   Chair/bed transfer method: Other Chair/bed transfer assist level: Moderate assist (Pt 50 - 74%/lift or lower) Chair/bed transfer assistive device: Mechanical lift Mechanical lift: Maximove   Locomotion Ambulation Ambulation activity did not occur: Safety/medical concerns         Wheelchair   Type: Manual Max wheelchair distance: 100' Assist Level: Supervision or verbal cues  Cognition Comprehension Comprehension assist level: Understands basic less than 25% of the time/ requires cueing >75% of the time  Expression Expression assist level: Expresses basis less than 25% of the time/requires cueing >75% of the time.  Social Interaction Social Interaction assist level: Interacts appropriately 25 - 49% of time - Needs frequent redirection.  Problem Solving Problem solving assist level: Solves basic less than 25% of the time - needs direction nearly all the time or does not effectively solve problems and may need a restraint for safety  Memory Memory assist level: Recognizes or recalls less than 25% of the time/requires cueing greater than 75% of the time    Medical Problem List and Plan: 1.  Decreased functional mobility secondary to left BKA 07/21/2017 as well as history of right BKA. Patient does use a right prosthesis   Cont CIR  2.  DVT  Prophylaxis/Anticoagulation: Subcutaneous Lovenox. Monitor for any bleeding episodes 3. Pain Management/chronic pain: Soma 350 mg 3 times a day, Neurontin 1200 mg 3 times a day, MS Contin 15 mg every 12 hours, Robaxin and oxycodone as needed 4. Mood: Effexor 75 mg twice a day Trazodone 25 mg daily at bedtime as needed 5. Neuropsych: This patient is capable of making decisions on his own behalf. 6. Skin/Wound Care: Routine skin checks 7. Fluids/Electrolytes/Nutrition: Routine I&O's   Cr. 0.87 on 1/9 8. Acute blood loss anemia. Continue iron supplement.    Hb 9.7 on 1/11   Hemoccult stools positive on 1/9   Cont to monitor 9. Diabetes mellitus and peripheral neuropathy. Hemoglobin A1c 6.5. Currently with SSI. Check blood sugars before meals and at bedtime. Diabetic teaching. Patient on Glucophage 500 mg twice a day prior to admission. Resume as needed   Metformin 500 daily started on 1/2, d/ced due to watery stool   Monitor with increased mobility CBG (last 3)  Recent Labs    08/04/17 1649 08/04/17 2313 08/05/17 0634  GLUCAP 125* 137* 113*    Relatively controlled on  1/11 10. Hypertension. Norvasc 10 mg daily.   Hydralazine 10 TID started on 1/9, increased to 25 on 1/10   Remains elevated, however patient fusing medications 11. Hyperlipidemia. Zocor 12.  Ileus: Improving   Pt also recently started on metformin will hold for now   KUB reviewed, improving 13. Hyponatremia: Resolved   Cont to monitor 14. Leukocytosis: Resolved   Afebrile      WBCs 7.5 on 1/9   Cont to monitor 15.  RUE swelling negative duplex doppler 1/5 16.  Confusion   Likely multifactorial with metabolic +/- wernikes encephalopathy   D/ced soma and changed oxyIR to Q6   Gabapentin reduced to 600mg  TID, d/ced on 1/9   MS contin changed to tramadol   LFTs pending, patient refusing labs   Ammonia elevated on 1/9, lactulose started   UA, U culture ordered, patient incontinent and refusing   CXR reviewed, showing  likely atelectasis   Appreciate neurology recs, MRI pending 17. Hypokalemia   K 2.4 on 1/11, critical value   IV ordered   PO 30 BID   Mag ordered as well   Supplement increased x2 days, refusing meds at times  LOS (Days) 11 A FACE TO FACE EVALUATION WAS PERFORMED  Justin Stone Justin Stone 08/05/2017 8:23 AM

## 2017-08-05 NOTE — Progress Notes (Signed)
Physical Therapy Session Note  Patient Details  Name: Justin Stone MRN: [id number] Date of Birth: 15-May-1961  Today's Date: 08/05/2017 PT Missed Time: 75 Minutes Missed Time Reason: CT/MRI;Unavailable (Comment)(Pt off unit for imaging )  Pt off unit for EEG, returned to unit briefly for meds and then leaving for MRI. Will follow up later this date if available to make up time.   Keosha Rossa K Arnette 08/05/2017, 10:33 AM

## 2017-08-05 NOTE — Significant Event (Signed)
Pt alert and oriented only to self and incoherent; refused scheduled 2200 oral medications on 08/04/2017 and scheduled 0600 oral medications on 08/05/2017; medication education attempted and pt remains incoherent; continue to monitor

## 2017-08-05 NOTE — Progress Notes (Signed)
Routine EEG completed, results pending. 

## 2017-08-05 NOTE — Progress Notes (Signed)
Potassium under 3.8, verbal order (P. Love, PAC)  given for two additional Potassium 10 mEq x 2 via central line/

## 2017-08-06 ENCOUNTER — Inpatient Hospital Stay (HOSPITAL_COMMUNITY): Payer: Medicare Other | Admitting: Occupational Therapy

## 2017-08-06 ENCOUNTER — Inpatient Hospital Stay (HOSPITAL_COMMUNITY): Payer: Medicare Other

## 2017-08-06 DIAGNOSIS — R140 Abdominal distension (gaseous): Secondary | ICD-10-CM

## 2017-08-06 DIAGNOSIS — R410 Disorientation, unspecified: Secondary | ICD-10-CM

## 2017-08-06 LAB — AMMONIA: AMMONIA: 26 umol/L (ref 9–35)

## 2017-08-06 LAB — BASIC METABOLIC PANEL
Anion gap: 7 (ref 5–15)
BUN: <5 mg/dL — ABNORMAL LOW (ref 6–20)
CO2: 17 mmol/L — AB (ref 22–32)
Calcium: 8.6 mg/dL — ABNORMAL LOW (ref 8.9–10.3)
Chloride: 115 mmol/L — ABNORMAL HIGH (ref 101–111)
Creatinine, Ser: 0.87 mg/dL (ref 0.61–1.24)
GFR calc Af Amer: >60 mL/min (ref >60)
GFR calc non Af Amer: >60 mL/min (ref >60)
GLUCOSE: 110 mg/dL — AB (ref 65–99)
POTASSIUM: 2.8 mmol/L — AB (ref 3.5–5.1)
Sodium: 139 mmol/L (ref 135–145)

## 2017-08-06 LAB — GLUCOSE, CAPILLARY
GLUCOSE-CAPILLARY: 115 mg/dL — AB (ref 65–99)
GLUCOSE-CAPILLARY: 130 mg/dL — AB (ref 65–99)
Glucose-Capillary: 111 mg/dL — ABNORMAL HIGH (ref 65–99)
Glucose-Capillary: 113 mg/dL — ABNORMAL HIGH (ref 65–99)

## 2017-08-06 LAB — OCCULT BLOOD X 1 CARD TO LAB, STOOL: Fecal Occult Bld: NEGATIVE

## 2017-08-06 LAB — C DIFFICILE QUICK SCREEN W PCR REFLEX
C Diff antigen: NEGATIVE
C Diff interpretation: NOT DETECTED
C Diff toxin: NEGATIVE

## 2017-08-06 LAB — MAGNESIUM: MAGNESIUM: 1.5 mg/dL — AB (ref 1.7–2.4)

## 2017-08-06 MED ORDER — POTASSIUM CHLORIDE 10 MEQ/50ML IV SOLN
10.0000 meq | INTRAVENOUS | Status: DC
  Filled 2017-08-06 (×4): qty 50

## 2017-08-06 MED ORDER — POTASSIUM CHLORIDE 10 MEQ/50ML IV SOLN
10.0000 meq | INTRAVENOUS | Status: AC
  Administered 2017-08-06 – 2017-08-07 (×4): 10 meq via INTRAVENOUS
  Filled 2017-08-06 (×4): qty 50

## 2017-08-06 MED ORDER — MAGNESIUM SULFATE 4 GM/100ML IV SOLN
4.0000 g | Freq: Once | INTRAVENOUS | Status: AC
  Administered 2017-08-06: 4 g via INTRAVENOUS
  Filled 2017-08-06: qty 100

## 2017-08-06 MED ORDER — LISINOPRIL 5 MG PO TABS
2.5000 mg | ORAL_TABLET | Freq: Every day | ORAL | Status: DC
  Administered 2017-08-06: 2.5 mg via ORAL
  Filled 2017-08-06: qty 1

## 2017-08-06 NOTE — Progress Notes (Signed)
[place] PHYSICAL MEDICINE & REHABILITATION     PROGRESS NOTE  Subjective/Complaints:  Patient seen lying in bed this morning. He slept well overnight per report and was more cooperative. He remains confused and confabulates.  ROS: Unreliable due to cognition.  Objective: Vital Signs: Blood pressure (!) 159/76, pulse (!) 101, temperature 98.7 F (37.1 C), temperature source Oral, resp. rate 16, height 5\' 7"  (1.702 m), weight 114 kg (251 lb 5.2 oz), SpO2 99 %. Dg Chest 2 View  Result Date: 08/04/2017 CLINICAL DATA:  Infection EXAM: CHEST  2 VIEW COMPARISON:  07/06/2017 FINDINGS: Heart is enlarged. Right basilar opacity could reflect atelectasis or infiltrate. Left lung is clear. No effusions or acute bony abnormality. IMPRESSION: Cardiomegaly. Concern for focal right basilar infiltrate or atelectasis. Electronically Signed   By: Charlett NoseKevin  Dover M.D.   On: 08/04/2017 11:02   Dg Abd 1 View  Result Date: 08/04/2017 CLINICAL DATA:  Abdominal distension EXAM: ABDOMEN - 1 VIEW COMPARISON:  07/31/2017 FINDINGS: Scattered large and small bowel gas is noted. No obstructive changes are noted. The overall appearance has improved slightly in the interval from the prior exam. Degenerative changes of the lumbar spine are seen. No acute bony abnormality is noted. IMPRESSION: Decrease in the degree of gaseous distention. No acute abnormality noted. Electronically Signed   By: Alcide CleverMark  Lukens M.D.   On: 08/04/2017 11:07   Mr Brain Wo Contrast  Result Date: 08/05/2017 CLINICAL DATA:  Antegrade amnesia. Elevated ammonia. Question Wernicke encephalopathy. EXAM: MRI HEAD WITHOUT CONTRAST TECHNIQUE: Multiplanar, multiecho pulse sequences of the brain and surrounding structures were obtained without intravenous contrast. COMPARISON:  None. FINDINGS: Brain: The brainstem and cerebellum are normal. There is abnormal T2 and FLAIR signal as well as restricted diffusion within the hypothalamus and mamillary bodies, diagnostic  of the clinical suspicion of Wernicke encephalopathy. Elsewhere, the brain shows generalized atrophy with chronic small-vessel ischemic changes of the white matter. No hydrocephalus or extra-axial collection. No sign of brain hemorrhage. Vascular: Major vessels at the base of the brain show flow. Skull and upper cervical spine: Negative Sinuses/Orbits: Clear/normal Other: None IMPRESSION: Abnormal T2 and FLAIR signal with restricted diffusion in the medial hypothalamus and mamillary bodies, diagnostic of the clinical suspicion of Wernicke encephalopathy. Brain atrophy and chronic small-vessel ischemic changes elsewhere. Electronically Signed   By: Paulina FusiMark  Shogry M.D.   On: 08/05/2017 13:11   Recent Labs    08/05/17 0156  WBC 6.7  HGB 9.7*  HCT 30.7*  PLT 151   Recent Labs    08/05/17 1737 08/06/17 0439  NA 141 139  K 2.7* 2.8*  CL 114* 115*  GLUCOSE 153* 110*  BUN <5* <5*  CREATININE 0.88 0.87  CALCIUM 8.9 8.6*   CBG (last 3)  Recent Labs    08/05/17 1740 08/05/17 2054 08/06/17 0707  GLUCAP 148* 98 111*    Wt Readings from Last 3 Encounters:  08/03/17 114 kg (251 lb 5.2 oz)  07/21/17 113.4 kg (250 lb)  07/21/17 113.8 kg (250 lb 12.8 oz)    Physical Exam:  BP (!) 159/76 (BP Location: Left Wrist)   Pulse (!) 101   Temp 98.7 F (37.1 C) (Oral)   Resp 16   Ht 5\' 7"  (1.702 m)   Wt 114 kg (251 lb 5.2 oz)   SpO2 99%   BMI 39.36 kg/m  General: NAD. Vitals stable. HENT: Normocephalic. Atraumatic Eyes: EOMI. No discharge Cardiac. RRR.  No JVD. Respiratory. Clear to auscultation. Unlabored.  Abdomen: good  bowel sounds. nondistended Musculoskeletal: Bilateral BKA Neurological. Alert Followed full commands. Motor: B/l UE 5/5 proximal to distal LLE: HF 4-/5 (unchanged) RLE: HF, KE 4+/5 Skin. Left BKA with staples C/D/I  Right BKA with small ulcer area at base of stump. Psych: Confabulation  Assessment/Plan: 1. Functional deficits secondary to bilateral BKA which  require 3+ hours per day of interdisciplinary therapy in a comprehensive inpatient rehab setting. Physiatrist is providing close team supervision and 24 hour management of active medical problems listed below. Physiatrist and rehab team continue to assess barriers to discharge/monitor patient progress toward functional and medical goals.  Function:  Bathing Bathing position   Position: Sitting EOB  Bathing parts Body parts bathed by patient: Right arm, Left arm, Chest, Abdomen, Right upper leg, Left upper leg Body parts bathed by helper: Front perineal area, Buttocks, Back  Bathing assist Assist Level: Touching or steadying assistance(Pt > 75%)      Upper Body Dressing/Undressing Upper body dressing   What is the patient wearing?: Hospital gown     Pull over shirt/dress - Perfomed by patient: Thread/unthread right sleeve, Thread/unthread left sleeve, Put head through opening, Pull shirt over trunk Pull over shirt/dress - Perfomed by helper: Pull shirt over trunk        Upper body assist Assist Level: Supervision or verbal cues      Lower Body Dressing/Undressing Lower body dressing   What is the patient wearing?: Pants     Pants- Performed by patient: Thread/unthread left pants leg Pants- Performed by helper: Thread/unthread right pants leg, Thread/unthread left pants leg, Pull pants up/down                      Lower body assist Assist for lower body dressing: Touching or steadying assistance (Pt > 75%)      Toileting Toileting   Toileting steps completed by patient: Performs perineal hygiene Toileting steps completed by helper: Adjust clothing prior to toileting, Performs perineal hygiene, Adjust clothing after toileting Toileting Assistive Devices: Grab bar or rail  Toileting assist Assist level: Touching or steadying assistance (Pt.75%)   Transfers Chair/bed transfer   Chair/bed transfer method: Other Chair/bed transfer assist level: Moderate assist (Pt 50  - 74%/lift or lower) Chair/bed transfer assistive device: Mechanical lift Mechanical lift: Maximove   Locomotion Ambulation Ambulation activity did not occur: Safety/medical concerns         Wheelchair   Type: Manual Max wheelchair distance: 100' Assist Level: Supervision or verbal cues  Cognition Comprehension Comprehension assist level: Understands basic 50 - 74% of the time/ requires cueing 25 - 49% of the time  Expression Expression assist level: Expresses basic 25 - 49% of the time/requires cueing 50 - 75% of the time. Uses single words/gestures.  Social Interaction Social Interaction assist level: Interacts appropriately 25 - 49% of time - Needs frequent redirection.  Problem Solving Problem solving assist level: Solves basic less than 25% of the time - needs direction nearly all the time or does not effectively solve problems and may need a restraint for safety  Memory Memory assist level: Recognizes or recalls less than 25% of the time/requires cueing greater than 75% of the time    Medical Problem List and Plan: 1.  Decreased functional mobility secondary to left BKA 07/21/2017 as well as history of right BKA. Patient does use a right prosthesis   Cont CIR  2.  DVT Prophylaxis/Anticoagulation: Subcutaneous Lovenox. Monitor for any bleeding episodes 3. Pain Management/chronic pain: Soma 350 mg  3 times a day, Neurontin 1200 mg 3 times a day, MS Contin 15 mg every 12 hours, Robaxin and oxycodone as needed 4. Mood: Effexor 75 mg twice a day Trazodone 25 mg daily at bedtime as needed 5. Neuropsych: This patient is capable of making decisions on his own behalf. 6. Skin/Wound Care: Routine skin checks 7. Fluids/Electrolytes/Nutrition: Routine I&O's   Cr. 0.87 on 1/9 8. Acute blood loss anemia. Continue iron supplement.    Hb 9.7 on 1/11   Hemoccult stools positive on 1/9   Cont to monitor 9. Diabetes mellitus and peripheral neuropathy. Hemoglobin A1c 6.5. Currently with SSI.  Check blood sugars before meals and at bedtime. Diabetic teaching. Patient on Glucophage 500 mg twice a day prior to admission. Resume as needed   Metformin 500 daily started on 1/2, d/ced due to watery stool   Monitor with increased mobility CBG (last 3)  Recent Labs    08/05/17 1740 08/05/17 2054 08/06/17 0707  GLUCAP 148* 98 111*    Relatively controlled on 1/12 10. Hypertension. Norvasc 10 mg daily.   Hydralazine 10 TID started on 1/9, increased to 25 on 1/10, increased to 50 on 1/12   Lisinopril 2.5 added on 1/12 11. Hyperlipidemia. Zocor 12.  Ileus: Improving   Pt also recently started on metformin will hold for now   KUB reviewed, improving   C. difficile ordered due to loose stools, however less likely infectious cause 13. Hyponatremia: Resolved   Cont to monitor 14. Leukocytosis: Resolved   Afebrile      WBCs 7.5 on 1/9   Cont to monitor 15.  RUE swelling negative duplex doppler 1/5 16.  Confusion   Likely multifactorial with metabolic +/- wernikes encephalopathy   D/ced soma and changed oxyIR to Q6   Gabapentin reduced to 600mg  TID, d/ced on 1/9   MS contin changed to tramadol   LFTs pending, patient refusing labs   Ammonia elevated on 1/9, lactulose started   UA, U culture ordered, patient incontinent and refusing cath   CXR reviewed, showing likely atelectasis   EEG with generalized slowing   Received Neuro recs   Appreciate neurology recs, MRI pending 17. Hypokalemia   K 2.8 on 1/12, mag pending   IV mag and potassium ordered on 1/11, will likely repeat today.   PO 30 BID   Mag ordered as well   Supplement increased x2 days, refusing meds at times  LOS (Days) 12 A FACE TO FACE EVALUATION WAS PERFORMED  Jacaden Forbush Karis Juba 08/06/2017 8:04 AM

## 2017-08-06 NOTE — Progress Notes (Signed)
Occupational Therapy Session Note  Patient Details  Name: Justin Stone MRN: [id number] Date of Birth: 04-Dec-1960  Today's Date: 08/06/2017 OT Individual Time: 1345-1415 OT Individual Time Calculation (min): 30 min    Short Term Goals: Week 1:  OT Short Term Goal 1 (Week 1): Pt will complete BSC transfer with S OT Short Term Goal 1 - Progress (Week 1): Not met OT Short Term Goal 2 (Week 1): Pt will complete LB washing with S OT Short Term Goal 2 - Progress (Week 1): Not met OT Short Term Goal 3 (Week 1): Pt will complete grooming at sink with S OT Short Term Goal 3 - Progress (Week 1): Not met OT Short Term Goal 4 (Week 1): Pt will use w/c to gather clothes in room for dressing task with S  OT Short Term Goal 4 - Progress (Week 1): Not met Week 2:  OT Short Term Goal 1 (Week 2): Pt will complete all UB and LB bathing sitting with lateral leans side to side and min assist.   OT Short Term Goal 2 (Week 2): Pt will donn pull up shorts or pants with min assist and lateral leans side to side.  OT Short Term Goal 3 (Week 2): Pt will complete 3 grooming tasks from wheelchair level at the sink with supervision and no more than min instructional cueing.   OT Short Term Goal 4 (Week 2): Pt iwll perform toilet transfer with min assist using sliding board to drop arm commode.    Skilled Therapeutic Interventions/Progress Updates:    1:1 Pt asleep and naked  when arrived. Pt not oriented to place or time of day. Pt was oriented to self. Pt unaware of incontient episode of BM in the bed.  Total A +@2  roll side to side to clean up. Pt very resistance to allowing clean up - complaining of pain. Upon further assessment- pt with skin breakdown along scrotum and groin area and very sensitive.  With time pt allowed for therapists to clean up pt.  Pt declined opportunities to assist. Left pt setup for lunch in bed.   Therapy Documentation Precautions:  Precautions Precautions: Fall Required Braces or  Orthoses: Other Brace/Splint Other Brace/Splint: Has LLE splint for positioning of amputation Restrictions Weight Bearing Restrictions: Yes LLE Weight Bearing: Non weight bearing Other Position/Activity Restrictions: has R prosthtic leg for R BKA (OLD); L BKA (NEW) General:   Vital Signs: Therapy Vitals Temp: 98.7 F (37.1 C) Temp Source: Oral Pulse Rate: 99 Resp: 16 BP: (!) 169/76 Patient Position (if appropriate): Lying Oxygen Therapy SpO2: 100 % O2 Device: Not Delivered Pain:  sorenss in groin area  See Function Navigator for Current Functional Status.   Therapy/Group: Individual Therapy  Willeen Cass [facility] 08/06/2017, 3:43 PM

## 2017-08-06 NOTE — Progress Notes (Signed)
Physical Therapy Session Note  Patient Details  Name: Justin PlateSheldon C Stone MRN: [id number] Date of Birth: 06/15/1961  Today's Date: 08/06/2017 PT Individual Time: 0800-0858 PT Individual Time Calculation (min): 58 min   Short Term Goals: Week 2:  PT Short Term Goal 1 (Week 2): =LTGs due to ELOS  Skilled Therapeutic Interventions/Progress Updates:    Pt supine in bed upon PT arrival, agreeable to therapy tx and reports pain but unable to specify location or rate. Pt A&O x 1 (name) this session. Pt worked on rolling and reaching for bedrails with max assist in order to don brief, max verbal cues for sequencing and attention to task. Pt reported he could not do anything until he gets food and medication for pain, RN made aware. Pt performed scooting up in bed using bedrails to pull up, mod assist from therapist, for better positioning in bed. Pt able to find buttons to raise HOB with max cues from therapist. Pt sat up in bed to eat breakfast, he needed max assist verbal cues to initiate and complete feeding himself his breakfast meal in a timely manner as pt would often take one sip or spoonful of broth and then stare off into space or become distracted by items in his bed or on his tray. Pt asked to name/locate items on his tray such as utensils, food items and drink, pt kept repeating to therapist "I would eat this but this is your food." Pt encouraged to write in his memory notebook, verbal cues for date and orientation to which therapy session he was in. Pt requesting to watch tv at end of session, therapist provided mod verbal cues for pt to locate tv remote and power button. Pt left supine in bed with needs in reach.     Therapy Documentation Precautions:  Precautions Precautions: Fall Required Braces or Orthoses: Other Brace/Splint Other Brace/Splint: Has LLE splint for positioning of amputation Restrictions Weight Bearing Restrictions: Yes LLE Weight Bearing: Non weight bearing Other  Position/Activity Restrictions: has R prosthtic leg for R BKA (OLD); L BKA (NEW)   See Function Navigator for Current Functional Status.   Therapy/Group: Individual Therapy  Cresenciano GenreEmily van Schagen, PT, DPT 08/06/2017, 7:42 AM

## 2017-08-06 NOTE — Progress Notes (Signed)
MD Allena KatzPatel contacted; order to discontinue lactulose 20g BID

## 2017-08-06 NOTE — Progress Notes (Signed)
Subjective: No significant changes  Exam: Vitals:   08/06/17 0500 08/06/17 1500  BP: (!) 159/76 (!) 169/76  Pulse: (!) 101 99  Resp: 16 16  Temp: 98.7 F (37.1 C) 98.7 F (37.1 C)  SpO2: 99% 100%   Gen: In bed, NAD Resp: non-labored breathing, no acute distress Abd: soft, nt  Neuro: MS: awake, alert, not oriented but not able to tell me then name, state, month or year. Continues to confabuilate.  MW:NUUVCN:face symmetric, EOMI Motor: moves all extrmeities well; Sensory:intact to LT  MRI brain-changes consistent with Korsakoff syndrome  Impression: 57 yo M with anterograde amnesia and relatively preserved otherwise.  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  I hope that he will get some benefit from high-dose IV thiamine, however this is often a fairly debilitating long-term condition.   Recommendations: 1) finish high-dose IV thiamine, followed by 100 mg daily 2) could use Seroquel as needed, but I doubt that his syndrome will be significantly proved by scheduled dosing. 3) I suspect that he is likely to have significant long-term sequela from this. 4) no further recommendations at this time, neurology will sign off, please call with further questions or concerns.   Ritta SlotMcNeill Kirkpatrick, MD [facility] [phone]  If 7pm- 7am, please page neurology on call as listed in AMION.

## 2017-08-07 ENCOUNTER — Inpatient Hospital Stay (HOSPITAL_COMMUNITY): Payer: Medicare Other

## 2017-08-07 LAB — GLUCOSE, CAPILLARY
GLUCOSE-CAPILLARY: 111 mg/dL — AB (ref 65–99)
GLUCOSE-CAPILLARY: 104 mg/dL — AB (ref 65–99)
GLUCOSE-CAPILLARY: 112 mg/dL — AB (ref 65–99)
Glucose-Capillary: 130 mg/dL — ABNORMAL HIGH (ref 65–99)

## 2017-08-07 MED ORDER — LISINOPRIL 10 MG PO TABS
10.0000 mg | ORAL_TABLET | Freq: Every day | ORAL | Status: DC
  Administered 2017-08-08 – 2017-08-18 (×11): 10 mg via ORAL
  Filled 2017-08-07 (×11): qty 1

## 2017-08-07 MED ORDER — CALCIUM POLYCARBOPHIL 625 MG PO TABS
625.0000 mg | ORAL_TABLET | Freq: Every day | ORAL | Status: DC
  Administered 2017-08-07 – 2017-08-09 (×3): 625 mg via ORAL
  Filled 2017-08-07 (×3): qty 1

## 2017-08-07 NOTE — Progress Notes (Signed)
[place] PHYSICAL MEDICINE & REHABILITATION     PROGRESS NOTE  Subjective/Complaints:  Patient seen sitting up in bed this morning. He slept well overnight per report. He appears more appropriate and cooperative this morning, but remains confused and confabulates  ROS: Unreliable due to cognition.  Objective: Vital Signs: Blood pressure (!) 169/76, pulse 94, temperature 98.7 F (37.1 C), temperature source Oral, resp. rate 16, height 5\' 7"  (1.702 m), weight 114 kg (251 lb 5.2 oz), SpO2 100 %. Mr Brain Wo Contrast  Result Date: 08/05/2017 CLINICAL DATA:  Antegrade amnesia. Elevated ammonia. Question Wernicke encephalopathy. EXAM: MRI HEAD WITHOUT CONTRAST TECHNIQUE: Multiplanar, multiecho pulse sequences of the brain and surrounding structures were obtained without intravenous contrast. COMPARISON:  None. FINDINGS: Brain: The brainstem and cerebellum are normal. There is abnormal T2 and FLAIR signal as well as restricted diffusion within the hypothalamus and mamillary bodies, diagnostic of the clinical suspicion of Wernicke encephalopathy. Elsewhere, the brain shows generalized atrophy with chronic small-vessel ischemic changes of the white matter. No hydrocephalus or extra-axial collection. No sign of brain hemorrhage. Vascular: Major vessels at the base of the brain show flow. Skull and upper cervical spine: Negative Sinuses/Orbits: Clear/normal Other: None IMPRESSION: Abnormal T2 and FLAIR signal with restricted diffusion in the medial hypothalamus and mamillary bodies, diagnostic of the clinical suspicion of Wernicke encephalopathy. Brain atrophy and chronic small-vessel ischemic changes elsewhere. Electronically Signed   By: Paulina Fusi M.D.   On: 08/05/2017 13:11   Recent Labs    08/05/17 0156  WBC 6.7  HGB 9.7*  HCT 30.7*  PLT 151   Recent Labs    08/05/17 1737 08/06/17 0439  NA 141 139  K 2.7* 2.8*  CL 114* 115*  GLUCOSE 153* 110*  BUN <5* <5*  CREATININE 0.88 0.87   CALCIUM 8.9 8.6*   CBG (last 3)  Recent Labs    08/06/17 1702 08/06/17 2257 08/07/17 0637  GLUCAP 113* 130* 111*    Wt Readings from Last 3 Encounters:  08/03/17 114 kg (251 lb 5.2 oz)  07/21/17 113.4 kg (250 lb)  07/21/17 113.8 kg (250 lb 12.8 oz)    Physical Exam:  BP (!) 169/76 (BP Location: Right Arm)   Pulse 94   Temp 98.7 F (37.1 C) (Oral)   Resp 16   Ht 5\' 7"  (1.702 m)   Wt 114 kg (251 lb 5.2 oz)   SpO2 100%   BMI 39.36 kg/m  General: NAD. Vitals stable. HENT: Normocephalic. Atraumatic Eyes: EOMI. No discharge Cardiac. RRR.  No JVD. Respiratory. Clear to auscultation. Unlabored.  Abdomen: good bowel sounds. nondistended Musculoskeletal: Bilateral BKA Neurological. Alert Followed full commands. Motor: B/l UE 5/5 proximal to distal LLE: HF 4-/5 (stable) RLE: HF, KE 4+/5 Skin. Left BKA with staples C/D/I  Right BKA with small ulcer area at base of stump. Psych: Confabulation  Assessment/Plan: 1. Functional deficits secondary to bilateral BKA which require 3+ hours per day of interdisciplinary therapy in a comprehensive inpatient rehab setting. Physiatrist is providing close team supervision and 24 hour management of active medical problems listed below. Physiatrist and rehab team continue to assess barriers to discharge/monitor patient progress toward functional and medical goals.  Function:  Bathing Bathing position   Position: Sitting EOB  Bathing parts Body parts bathed by patient: Right arm, Left arm, Chest, Abdomen, Right upper leg, Left upper leg Body parts bathed by helper: Front perineal area, Buttocks, Back  Bathing assist Assist Level: Touching or steadying assistance(Pt > 75%)  Upper Body Dressing/Undressing Upper body dressing   What is the patient wearing?: Hospital gown     Pull over shirt/dress - Perfomed by patient: Thread/unthread right sleeve, Thread/unthread left sleeve, Put head through opening, Pull shirt over trunk Pull  over shirt/dress - Perfomed by helper: Pull shirt over trunk        Upper body assist Assist Level: Supervision or verbal cues      Lower Body Dressing/Undressing Lower body dressing   What is the patient wearing?: Pants     Pants- Performed by patient: Thread/unthread left pants leg Pants- Performed by helper: Thread/unthread right pants leg, Thread/unthread left pants leg, Pull pants up/down                      Lower body assist Assist for lower body dressing: Touching or steadying assistance (Pt > 75%)      Toileting Toileting   Toileting steps completed by patient: Performs perineal hygiene Toileting steps completed by helper: Adjust clothing prior to toileting, Performs perineal hygiene, Adjust clothing after toileting Toileting Assistive Devices: Grab bar or rail  Toileting assist Assist level: Touching or steadying assistance (Pt.75%)   Transfers Chair/bed transfer   Chair/bed transfer method: Other Chair/bed transfer assist level: Moderate assist (Pt 50 - 74%/lift or lower) Chair/bed transfer assistive device: Mechanical lift Mechanical lift: Maximove   Locomotion Ambulation Ambulation activity did not occur: Safety/medical concerns         Wheelchair   Type: Manual Max wheelchair distance: 100' Assist Level: Supervision or verbal cues  Cognition Comprehension Comprehension assist level: Understands basic 25 - 49% of the time/ requires cueing 50 - 75% of the time  Expression Expression assist level: Expresses basic 25 - 49% of the time/requires cueing 50 - 75% of the time. Uses single words/gestures.  Social Interaction Social Interaction assist level: Interacts appropriately 25 - 49% of time - Needs frequent redirection.  Problem Solving Problem solving assist level: Solves basic 25 - 49% of the time - needs direction more than half the time to initiate, plan or complete simple activities  Memory Memory assist level: Recognizes or recalls 25 - 49% of  the time/requires cueing 50 - 75% of the time    Medical Problem List and Plan: 1.  Decreased functional mobility secondary to left BKA 07/21/2017 as well as history of right BKA. Patient does use a right prosthesis   Cont CIR  2.  DVT Prophylaxis/Anticoagulation: Subcutaneous Lovenox. Monitor for any bleeding episodes 3. Pain Management/chronic pain: Soma 350 mg 3 times a day, Neurontin 1200 mg 3 times a day, MS Contin 15 mg every 12 hours, Robaxin and oxycodone as needed 4. Mood: Effexor 75 mg twice a day Trazodone 25 mg daily at bedtime as needed 5. Neuropsych: This patient is capable of making decisions on his own behalf. 6. Skin/Wound Care: Routine skin checks 7. Fluids/Electrolytes/Nutrition: Routine I&O's   Cr. 0.87 on 1/9 8. Acute blood loss anemia. Continue iron supplement.    Hb 9.7 on 1/11   Hemoccult stools positive on 1/9   Cont to monitor 9. Diabetes mellitus and peripheral neuropathy. Hemoglobin A1c 6.5. Currently with SSI. Check blood sugars before meals and at bedtime. Diabetic teaching. Patient on Glucophage 500 mg twice a day prior to admission. Resume as needed   Metformin 500 daily started on 1/2, d/ced due to watery stool   Monitor with increased mobility CBG (last 3)  Recent Labs    08/06/17 1702 08/06/17 2257 08/07/17  [id number]  GLUCAP 113* 130* 111*    Relatively controlled on 1/13 10. Hypertension. Norvasc 10 mg daily.   Hydralazine 10 TID started on 1/9, increased to 25 on 1/10, increased to 50 on 1/12   Lisinopril 2.5 added on 1/12, increased to 10 on 1/13 11. Hyperlipidemia. Zocor 12.  Ileus: Improving   Pt also recently started on metformin will hold for now   KUB reviewed, improving   C. difficile neg   Fiber added 1/13 13. Hyponatremia: Resolved   Cont to monitor 14. Leukocytosis: Resolved   Afebrile      WBCs 7.5 on 1/9   Cont to monitor 15.  RUE swelling negative duplex doppler 1/5 16.  Confusion   Likely multifactorial with metabolic +/-  wernikes encephalopathy   D/ced soma and changed oxyIR to Q6   Gabapentin reduced to 600mg  TID, d/ced on 1/9   MS contin changed to tramadol   LFTs remain pending   Ammonia WNL on 1/12, lactulose d/ced   UA, U culture ordered, patient incontinent and refusing cath   CXR reviewed, showing likely atelectasis   EEG with generalized slowing   Appreciate neurology recs, MRI? Pending, signed off 17. Hypokalemia   K 2.8 on 1/12, mag 1.5   IV mag and potassium ordered on 1/11, repeated on 1/12   PO 30 BID   Mag ordered as well   Supplement increased x2 days, refusing meds at times  LOS (Days) 13 A FACE TO FACE EVALUATION WAS PERFORMED  Ankit Karis Jubanil Patel 08/07/2017 8:22 AM

## 2017-08-07 NOTE — Progress Notes (Signed)
Physical Therapy Session Note  Patient Details  Name: Rolla PlateSheldon C Eichinger MRN: [id number] Date of Birth: Jan 14, 1961  Today's Date: 08/07/2017 PT Individual Time: 1302-1359 PT Individual Time Calculation (min): 57 min   Short Term Goals: Week 2:  PT Short Term Goal 1 (Week 2): =LTGs due to ELOS  Skilled Therapeutic Interventions/Progress Updates:    Pt supine in bed upon PT arrival, agreeable to therapy tx and denies pain. Pt oriented x 1 (to name) this session, therapist encouraged pt to read sign posted on his wall and reoriented pt. Pt transferred from supine>sitting EOB with mod assist. Therapist continued to reorient pt throughout session as he quickly forgot his location and his new amputation. Pt with poor awareness to situation but improved ability to follow commands this session, pt agreeable to transfer from bed>w/c. Pt transferred bed>TIS w/c with mod assist +2 for safety using slideboard, max verbal cues for initiation and sequencing. Pt transported to gym in TIS. Therapist engaged pt in use on memory book and asked pt questions about items written, pt able to answer correctly with cues. Pt wrote in memory book about today's therapy session with cues. Pt reported needing to use the bathroom. Pt transported back to room in w/c total assist. Pt transferred from w/c>bed using slideboard max assist +2 for safety.    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   Therapy/Group: Individual Therapy  Cresenciano GenreEmily van Schagen, PT, DPT 08/07/2017, 8:03 AM

## 2017-08-08 ENCOUNTER — Inpatient Hospital Stay (HOSPITAL_COMMUNITY): Payer: Medicare Other

## 2017-08-08 ENCOUNTER — Inpatient Hospital Stay (HOSPITAL_COMMUNITY): Payer: Medicare Other | Admitting: Occupational Therapy

## 2017-08-08 DIAGNOSIS — W19XXXA Unspecified fall, initial encounter: Secondary | ICD-10-CM

## 2017-08-08 LAB — CBC WITH DIFFERENTIAL/PLATELET
BASOS ABS: 0 10*3/uL (ref 0.0–0.1)
Basophils Relative: 1 %
Eosinophils Absolute: 0.3 10*3/uL (ref 0.0–0.7)
Eosinophils Relative: 4 %
HEMATOCRIT: 30.9 % — AB (ref 39.0–52.0)
HEMOGLOBIN: 9.6 g/dL — AB (ref 13.0–17.0)
LYMPHS PCT: 16 %
Lymphs Abs: 1.2 10*3/uL (ref 0.7–4.0)
MCH: 30.7 pg (ref 26.0–34.0)
MCHC: 31.1 g/dL (ref 30.0–36.0)
MCV: 98.7 fL (ref 78.0–100.0)
Monocytes Absolute: 0.8 10*3/uL (ref 0.1–1.0)
Monocytes Relative: 11 %
NEUTROS ABS: 5.4 10*3/uL (ref 1.7–7.7)
NEUTROS PCT: 68 %
Platelets: 153 10*3/uL (ref 150–400)
RBC: 3.13 MIL/uL — AB (ref 4.22–5.81)
RDW: 16.1 % — ABNORMAL HIGH (ref 11.5–15.5)
WBC: 7.8 10*3/uL (ref 4.0–10.5)

## 2017-08-08 LAB — MAGNESIUM: MAGNESIUM: 1.6 mg/dL — AB (ref 1.7–2.4)

## 2017-08-08 LAB — URINALYSIS, ROUTINE W REFLEX MICROSCOPIC
Bilirubin Urine: NEGATIVE
Glucose, UA: 50 mg/dL — AB
Hgb urine dipstick: NEGATIVE
Ketones, ur: NEGATIVE mg/dL
Leukocytes, UA: NEGATIVE
NITRITE: NEGATIVE
PH: 5 (ref 5.0–8.0)
Protein, ur: NEGATIVE mg/dL
SPECIFIC GRAVITY, URINE: 1.013 (ref 1.005–1.030)

## 2017-08-08 LAB — GLUCOSE, CAPILLARY
GLUCOSE-CAPILLARY: 111 mg/dL — AB (ref 65–99)
GLUCOSE-CAPILLARY: 107 mg/dL — AB (ref 65–99)
Glucose-Capillary: 107 mg/dL — ABNORMAL HIGH (ref 65–99)
Glucose-Capillary: 117 mg/dL — ABNORMAL HIGH (ref 65–99)

## 2017-08-08 LAB — COMPREHENSIVE METABOLIC PANEL
ALK PHOS: 119 U/L (ref 38–126)
ALT: 86 U/L — AB (ref 17–63)
AST: 92 U/L — AB (ref 15–41)
Albumin: 2.7 g/dL — ABNORMAL LOW (ref 3.5–5.0)
Anion gap: 8 (ref 5–15)
BILIRUBIN TOTAL: 1.2 mg/dL (ref 0.3–1.2)
CHLORIDE: 113 mmol/L — AB (ref 101–111)
CO2: 17 mmol/L — ABNORMAL LOW (ref 22–32)
CREATININE: 0.85 mg/dL (ref 0.61–1.24)
Calcium: 8.6 mg/dL — ABNORMAL LOW (ref 8.9–10.3)
GFR calc Af Amer: >60 mL/min (ref >60)
Glucose, Bld: 127 mg/dL — ABNORMAL HIGH (ref 65–99)
Potassium: 3.4 mmol/L — ABNORMAL LOW (ref 3.5–5.1)
Sodium: 138 mmol/L (ref 135–145)
Total Protein: 7.1 g/dL (ref 6.5–8.1)

## 2017-08-08 LAB — OCCULT BLOOD X 1 CARD TO LAB, STOOL: Fecal Occult Bld: POSITIVE — AB

## 2017-08-08 MED ORDER — NYSTATIN 100000 UNIT/GM EX POWD
Freq: Three times a day (TID) | CUTANEOUS | Status: DC
  Administered 2017-08-08 – 2017-08-18 (×30): via TOPICAL
  Filled 2017-08-08: qty 15

## 2017-08-08 MED ORDER — BOOST / RESOURCE BREEZE PO LIQD CUSTOM
1.0000 | Freq: Three times a day (TID) | ORAL | Status: DC
  Administered 2017-08-09 – 2017-08-12 (×9): 1 via ORAL

## 2017-08-08 NOTE — Progress Notes (Signed)
[place] PHYSICAL MEDICINE & REHABILITATION     PROGRESS NOTE  Subjective/Complaints:  Pt seen sitting up in bed this morning. He had a fall overnight following on his right stump. He remains confused this morning.  ROS: Unreliable due to cognition.  Objective: Vital Signs: Blood pressure (!) 149/83, pulse 98, temperature 98.2 F (36.8 C), temperature source Oral, resp. rate 18, height 5\' 7"  (1.702 m), weight 114 kg (251 lb 5.2 oz), SpO2 100 %. No results found. No results for input(s): WBC, HGB, HCT, PLT in the last 72 hours. Recent Labs    08/05/17 1737 08/06/17 0439  NA 141 139  K 2.7* 2.8*  CL 114* 115*  GLUCOSE 153* 110*  BUN <5* <5*  CREATININE 0.88 0.87  CALCIUM 8.9 8.6*   CBG (last 3)  Recent Labs    08/07/17 1645 08/07/17 2058 08/08/17 0634  GLUCAP 104* 112* 107*    Wt Readings from Last 3 Encounters:  08/03/17 114 kg (251 lb 5.2 oz)  07/21/17 113.4 kg (250 lb)  07/21/17 113.8 kg (250 lb 12.8 oz)    Physical Exam:  BP (!) 149/83 (BP Location: Right Arm)   Pulse 98   Temp 98.2 F (36.8 C) (Oral)   Resp 18   Ht 5\' 7"  (1.702 m)   Wt 114 kg (251 lb 5.2 oz)   SpO2 100%   BMI 39.36 kg/m  General: NAD. Vitals stable. HENT: Normocephalic. Atraumatic Eyes: EOMI. No discharge Cardiac. RRR.  No JVD. Respiratory. Clear to auscultation. unlabored.  Abdomen: good bowel sounds. nondistended Musculoskeletal: Bilateral BKA Neurological. Alert Followed full commands. Motor: B/l UE 5/5 proximal to distal LLE: HF 4-/5 (unchanged) RLE: HF, KE 4+/5 Skin. Left BKA with dressing C/D/I  Right BKA with mild sanguinous drainage at distal stump Psych: Confabulation  Assessment/Plan: 1. Functional deficits secondary to bilateral BKA which require 3+ hours per day of interdisciplinary therapy in a comprehensive inpatient rehab setting. Physiatrist is providing close team supervision and 24 hour management of active medical problems listed below. Physiatrist and  rehab team continue to assess barriers to discharge/monitor patient progress toward functional and medical goals.  Function:  Bathing Bathing position   Position: Sitting EOB  Bathing parts Body parts bathed by patient: Right arm, Left arm, Chest, Abdomen, Right upper leg, Left upper leg Body parts bathed by helper: Front perineal area, Buttocks, Back  Bathing assist Assist Level: Touching or steadying assistance(Pt > 75%)      Upper Body Dressing/Undressing Upper body dressing   What is the patient wearing?: Hospital gown     Pull over shirt/dress - Perfomed by patient: Thread/unthread right sleeve, Thread/unthread left sleeve, Put head through opening, Pull shirt over trunk Pull over shirt/dress - Perfomed by helper: Pull shirt over trunk        Upper body assist Assist Level: Supervision or verbal cues      Lower Body Dressing/Undressing Lower body dressing   What is the patient wearing?: Pants     Pants- Performed by patient: Thread/unthread left pants leg Pants- Performed by helper: Thread/unthread right pants leg, Thread/unthread left pants leg, Pull pants up/down                      Lower body assist Assist for lower body dressing: Touching or steadying assistance (Pt > 75%)      Toileting Toileting   Toileting steps completed by patient: Performs perineal hygiene Toileting steps completed by helper: Adjust clothing prior to toileting,  Performs perineal hygiene, Adjust clothing after toileting Toileting Assistive Devices: Grab bar or rail  Toileting assist Assist level: Touching or steadying assistance (Pt.75%)   Transfers Chair/bed transfer   Chair/bed transfer method: Lateral scoot Chair/bed transfer assist level: Moderate assist (Pt 50 - 74%/lift or lower) Chair/bed transfer assistive device: Sliding board Mechanical lift: Maximove   Locomotion Ambulation Ambulation activity did not occur: Safety/medical concerns         Wheelchair   Type:  Manual Max wheelchair distance: 100' Assist Level: Supervision or verbal cues  Cognition Comprehension Comprehension assist level: Understands basic 50 - 74% of the time/ requires cueing 25 - 49% of the time  Expression Expression assist level: Expresses basic 50 - 74% of the time/requires cueing 25 - 49% of the time. Needs to repeat parts of sentences.  Social Interaction Social Interaction assist level: Interacts appropriately 25 - 49% of time - Needs frequent redirection.  Problem Solving Problem solving assist level: Solves basic 25 - 49% of the time - needs direction more than half the time to initiate, plan or complete simple activities  Memory Memory assist level: Recognizes or recalls less than 25% of the time/requires cueing greater than 75% of the time    Medical Problem List and Plan: 1.  Decreased functional mobility secondary to left BKA 07/21/2017 as well as history of right BKA. Patient does use a right prosthesis   Cont CIR  2.  DVT Prophylaxis/Anticoagulation: Subcutaneous Lovenox. Monitor for any bleeding episodes 3. Pain Management/chronic pain: Soma 350 mg 3 times a day, Neurontin 1200 mg 3 times a day, MS Contin 15 mg every 12 hours, Robaxin and oxycodone as needed 4. Mood: Effexor 75 mg twice a day Trazodone 25 mg daily at bedtime as needed 5. Neuropsych: This patient is capable of making decisions on his own behalf. 6. Skin/Wound Care: Routine skin checks   Monitor right BKA for breakdown after fall 7. Fluids/Electrolytes/Nutrition: Routine I&O's   Cr. 0.87 on 1/9 8. Acute blood loss anemia. Continue iron supplement.    Hb 9.7 on 1/11   Hemoccult stools positive on 1/9   Cont to monitor 9. Diabetes mellitus and peripheral neuropathy. Hemoglobin A1c 6.5. Currently with SSI. Check blood sugars before meals and at bedtime. Diabetic teaching. Patient on Glucophage 500 mg twice a day prior to admission. Resume as needed   Metformin 500 daily started on 1/2, d/ced due to  watery stool   Monitor with increased mobility CBG (last 3)  Recent Labs    08/07/17 1645 08/07/17 2058 08/08/17 0634  GLUCAP 104* 112* 107*    Relatively controlled on 1/14 10. Hypertension. Norvasc 10 mg daily.   Hydralazine 10 TID started on 1/9, increased to 25 on 1/10, increased to 50 on 1/12   Lisinopril 2.5 added on 1/12, increased to 10 on 1/13   Improving 11. Hyperlipidemia. Zocor 12.  Ileus: Improving   Pt also recently started on metformin will hold for now   KUB reviewed, improving   C. difficile neg   Fiber added 1/13 13. Hyponatremia: Resolved   Cont to monitor 14. Leukocytosis: Resolved   Afebrile      WBCs 7.5 on 1/9   Cont to monitor 15.  RUE swelling negative duplex doppler 1/5 16.  Confusion   Likely multifactorial with metabolic +/- wernikes encephalopathy   D/ced soma and changed oxyIR to Q6   Gabapentin reduced to 600mg  TID, d/ced on 1/9   MS contin changed to tramadol   LFTs  remain pending   Ammonia WNL on 1/12, lactulose d/ced   UA, U culture ordered, patient incontinent and refusing cath, remains pending   CXR reviewed, showing likely atelectasis   EEG with generalized slowing   MRI consistent with Wernicke's encephalopathy    Appreciate neurology recs,signed off 17. Hypokalemia   K 2.8 on 1/12, mag 1.5   IV mag and potassium ordered on 1/11, repeated on 1/12   PO 30 BID   Mag ordered pending   Supplement increased   LOS (Days) 14 A FACE TO FACE EVALUATION WAS PERFORMED  Ankit Karis Juba 08/08/2017 7:58 AM

## 2017-08-08 NOTE — Plan of Care (Signed)
Patient fell during the shift because he failed to call for help.

## 2017-08-08 NOTE — Progress Notes (Signed)
Initial Nutrition Assessment  DOCUMENTATION CODES:   Obesity unspecified  INTERVENTION:  Provide Boost Breeze po TID, each supplement provides 250 kcal and 9 grams of protein.  NUTRITION DIAGNOSIS:   Inadequate oral intake related to altered GI function(ileus) as evidenced by (clear liquid diet 7 days).  GOAL:   Patient will meet greater than or equal to 90% of their needs  MONITOR:   Diet advancement, PO intake, Supplement acceptance, Weight trends, Labs, Skin, I & O's  REASON FOR ASSESSMENT:   (Clear liquid diet 7 days)    ASSESSMENT:   57 year old right-handed male with history of diabetes mellitus, chronic pain syndrome, hypertension, right BKA several years ago. Presented 07/21/2017 with ulceration to left foot and no change with conservative care. X-rays of left foot consistent with cellulitis and osteomyelitis. Underwent left BKA 07/21/2017  Pt was unavailable during time of visit. Pt has been on a clear liquid diet over the past 7 days due to ileus. Per MD note, ileus has been improving. Per RN, pt has been eating better today and has been moving his bowels with no difficulties. RN hopeful diet will be advanced soon. RD to order nutritional supplements to aid in caloric and protein needs.   Labs and medications reviewed.   NUTRITION - FOCUSED PHYSICAL EXAM:    Most Recent Value  Orbital Region  Unable to assess  Upper Arm Region  Unable to assess  Thoracic and Lumbar Region  Unable to assess  Buccal Region  Unable to assess  Temple Region  Unable to assess  Clavicle Bone Region  Unable to assess  Clavicle and Acromion Bone Region  Unable to assess  Scapular Bone Region  Unable to assess  Dorsal Hand  Unable to assess  Patellar Region  Unable to assess  Anterior Thigh Region  Unable to assess  Posterior Calf Region  Unable to assess  Edema (RD Assessment)  Unable to assess  Hair  Unable to assess  Eyes  Unable to assess  Mouth  Unable to assess  Skin   Unable to assess  Nails  Unable to assess       Diet Order:  Diet clear liquid Room service appropriate? Yes; Fluid consistency: Thin  EDUCATION NEEDS:   Not appropriate for education at this time  Skin:  Skin Assessment: Skin Integrity Issues: Skin Integrity Issues:: Unstageable, Incisions Unstageable: R leg Incisions: L leg  Last BM:  1/14(type 7, small amount)  Height:   Ht Readings from Last 1 Encounters:  07/25/17 5\' 7"  (1.702 m)    Weight:   Wt Readings from Last 1 Encounters:  08/03/17 251 lb 5.2 oz (114 kg)    Ideal Body Weight:  58 kg(adjusted for bilateral BKA)  BMI:  Body mass index is 39.36 kg/m.  Estimated Nutritional Needs:   Kcal:  2000-2200  Protein:  105-115 grams  Fluid:  2-2.2 L/day    Roslyn SmilingStephanie Fortune Brannigan, MS, RD, LDN Pager # [phone] After hours/ weekend pager # [phone]

## 2017-08-08 NOTE — Progress Notes (Signed)
08/07/17 2000  What Happened  Was fall witnessed? No  Was patient injured? No  Patient found on floor  Found by Staff-comment  Stated prior activity to/from bed, chair, or stretcher  Follow Up  MD notified yes  Time MD notified 2030  Family notified Yes-comment  Time family notified 2030  Additional tests No  Simple treatment Other (comment)  Progress note created (see row info) Yes  Adult Fall Risk Assessment  Risk Factor Category (scoring not indicated) Fall has occurred during this admission (document High fall risk)  Patient's Fall Risk High Fall Risk (>13 points)  Adult Fall Risk Interventions  Required Bundle Interventions *See Row Information* High fall risk - low, moderate, and high requirements implemented  Additional Interventions Use of appropriate toileting equipment (bedpan, BSC, etc.)  Screening for Fall Injury Risk  Risk For Fall Injury- See Row Information  None identified  Screening for Fall Injury Risk Interventions  Specialty Low Bed Contraindicated Centrella Smart Bed  Vitals  Temp 98 F (36.7 C)  Temp Source Oral  BP (!) 149/84  BP Location Right Arm  BP Method Automatic  Patient Position (if appropriate) Lying  Pulse Rate (!) 104  Pulse Rate Source Dinamap  Resp (!) 21  Oxygen Therapy  SpO2 100 %  O2 Device Room Air  Pain Assessment  Pain Assessment No/denies pain  PCA/Epidural/Spinal Assessment  Respiratory Pattern Regular;Unlabored  Neurological  Neuro (WDL) X  Level of Consciousness Alert  Orientation Level Oriented to person;Disoriented to place;Disoriented to time;Disoriented to situation  Cognition Memory impairment  Speech Clear  Pupil Assessment  No  Motor Function/Sensation Assessment Grip  R Hand Grip Strong  L Hand Grip Strong  RUE Sensation Full sensation  LUE Sensation Full sensation  RLE Sensation Decreased  LLE Sensation Decreased  Neuro Symptoms None  Neuro symptoms relieved by Rest  Musculoskeletal  Musculoskeletal  (WDL) X  Assistive Device MaxiMove  Generalized Weakness Yes  Weight Bearing Restrictions Yes  LLE Weight Bearing NWB  Musculoskeletal Details  RUE Full movement  RLE BKA  LUE Full movement  LLE BKA  Right Lower Leg Ortho/Supportive Device  Left Lower Leg Ortho/Supportive Device  Right Lower Leg Ortho/Supportive Device Prosthesis  Left Lower Leg Ortho/Supportive Device Ace wrap;Splint  Integumentary  Integumentary (WDL) X  Skin Color Appropriate for ethnicity  Skin Condition Dry  Skin Integrity Ecchymosis;Amputation  Abrasion Location Leg  Abrasion Location Orientation Right  Abrasion Intervention Foam  Amputation Location Leg  Amputation Location Orientation Bilateral  Amputation Intervention Gauze  Ecchymosis Location Arm  Ecchymosis Location Orientation Left  Ecchymosis Intervention Other (Comment) (Assessed)  Skin Turgor Non-tenting    08/07/17 2000  What Happened  Was fall witnessed? No  Was patient injured? No  Patient found on floor  Found by Staff-comment  Stated prior activity to/from bed, chair, or stretcher  Follow Up  MD notified yes  Time MD notified 2030  Family notified Yes-comment  Time family notified 2030  Additional tests No  Simple treatment Other (comment)  Progress note created (see row info) Yes  Adult Fall Risk Assessment  Risk Factor Category (scoring not indicated) Fall has occurred during this admission (document High fall risk)  Patient's Fall Risk High Fall Risk (>13 points)  Adult Fall Risk Interventions  Required Bundle Interventions *See Row Information* High fall risk - low, moderate, and high requirements implemented  Additional Interventions Use of appropriate toileting equipment (bedpan, BSC, etc.)  Screening for Fall Injury Risk  Risk For Fall Injury-  See Row Information  None identified  Screening for Fall Injury Risk Interventions  Specialty Low Bed Contraindicated Centrella Smart Bed  Vitals  Temp 98 F (36.7 C)  Temp  Source Oral  BP (!) 149/84  BP Location Right Arm  BP Method Automatic  Patient Position (if appropriate) Lying  Pulse Rate (!) 104  Pulse Rate Source Dinamap  Resp (!) 21  Oxygen Therapy  SpO2 100 %  O2 Device Room Air  Pain Assessment  Pain Assessment No/denies pain  PCA/Epidural/Spinal Assessment  Respiratory Pattern Regular;Unlabored  Neurological  Neuro (WDL) X  Level of Consciousness Alert  Orientation Level Oriented to person;Disoriented to place;Disoriented to time;Disoriented to situation  Cognition Memory impairment  Speech Clear  Pupil Assessment  No  Motor Function/Sensation Assessment Grip  R Hand Grip Strong  L Hand Grip Strong  RUE Sensation Full sensation  LUE Sensation Full sensation  RLE Sensation Decreased  LLE Sensation Decreased  Neuro Symptoms None  Neuro symptoms relieved by Rest  Musculoskeletal  Musculoskeletal (WDL) X  Assistive Device MaxiMove  Generalized Weakness Yes  Weight Bearing Restrictions Yes  LLE Weight Bearing NWB  Musculoskeletal Details  RUE Full movement  RLE BKA  LUE Full movement  LLE BKA  Right Lower Leg Ortho/Supportive Device  Left Lower Leg Ortho/Supportive Device  Right Lower Leg Ortho/Supportive Device Prosthesis  Left Lower Leg Ortho/Supportive Device Ace wrap;Splint  Integumentary  Integumentary (WDL) X  Skin Color Appropriate for ethnicity  Skin Condition Dry  Skin Integrity Ecchymosis;Amputation  Abrasion Location Leg  Abrasion Location Orientation Right  Abrasion Intervention Foam  Amputation Location Leg  Amputation Location Orientation Bilateral  Amputation Intervention Gauze  Ecchymosis Location Arm  Ecchymosis Location Orientation Left  Ecchymosis Intervention Other (Comment) (Assessed)  Skin Turgor Non-tenting    08/07/17 2000  What Happened  Was fall witnessed? No  Was patient injured? No  Patient found on floor  Found by Staff-comment  Stated prior activity to/from bed, chair, or  stretcher  Follow Up  MD notified yes  Time MD notified 2030  Family notified Yes-comment  Time family notified 2030  Additional tests No  Simple treatment Other (comment)  Progress note created (see row info) Yes  Adult Fall Risk Assessment  Risk Factor Category (scoring not indicated) Fall has occurred during this admission (document High fall risk)  Patient's Fall Risk High Fall Risk (>13 points)  Adult Fall Risk Interventions  Required Bundle Interventions *See Row Information* High fall risk - low, moderate, and high requirements implemented  Additional Interventions Use of appropriate toileting equipment (bedpan, BSC, etc.)  Screening for Fall Injury Risk  Risk For Fall Injury- See Row Information  None identified  Screening for Fall Injury Risk Interventions  Specialty Low Bed Contraindicated Centrella Smart Bed  Vitals  Temp 98 F (36.7 C)  Temp Source Oral  BP (!) 149/84  BP Location Right Arm  BP Method Automatic  Patient Position (if appropriate) Lying  Pulse Rate (!) 104  Pulse Rate Source Dinamap  Resp (!) 21  Oxygen Therapy  SpO2 100 %  O2 Device Room Air  Pain Assessment  Pain Assessment No/denies pain  PCA/Epidural/Spinal Assessment  Respiratory Pattern Regular;Unlabored  Neurological  Neuro (WDL) X  Level of Consciousness Alert  Orientation Level Oriented to person;Disoriented to place;Disoriented to time;Disoriented to situation  Cognition Memory impairment  Speech Clear  Pupil Assessment  No  Motor Function/Sensation Assessment Grip  R Hand Grip Strong  L Hand Grip Strong  RUE  Sensation Full sensation  LUE Sensation Full sensation  RLE Sensation Decreased  LLE Sensation Decreased  Neuro Symptoms None  Neuro symptoms relieved by Rest  Musculoskeletal  Musculoskeletal (WDL) X  Assistive Device MaxiMove  Generalized Weakness Yes  Weight Bearing Restrictions Yes  LLE Weight Bearing NWB  Musculoskeletal Details  RUE Full movement  RLE BKA   LUE Full movement  LLE BKA  Right Lower Leg Ortho/Supportive Device  Left Lower Leg Ortho/Supportive Device  Right Lower Leg Ortho/Supportive Device Prosthesis  Left Lower Leg Ortho/Supportive Device Ace wrap;Splint  Integumentary  Integumentary (WDL) X  Skin Color Appropriate for ethnicity  Skin Condition Dry  Skin Integrity Ecchymosis;Amputation  Abrasion Location Leg  Abrasion Location Orientation Right  Abrasion Intervention Foam  Amputation Location Leg  Amputation Location Orientation Bilateral  Amputation Intervention Gauze  Ecchymosis Location Arm  Ecchymosis Location Orientation Left  Ecchymosis Intervention Other (Comment) (Assessed)  Skin Turgor Non-tenting

## 2017-08-08 NOTE — Progress Notes (Signed)
Occupational Therapy Session Note  Patient Details  Name: Justin Stone MRN: [id number] Date of Birth: 1960-11-29  Today's Date: 08/08/2017 OT Individual Time: 1301-1400 OT Individual Time Calculation (min): 59 min    Short Term Goals: Week 2:  OT Short Term Goal 1 (Week 2): Pt will complete all UB and LB bathing sitting with lateral leans side to side and min assist.   OT Short Term Goal 2 (Week 2): Pt will donn pull up shorts or pants with min assist and lateral leans side to side.  OT Short Term Goal 3 (Week 2): Pt will complete 3 grooming tasks from wheelchair level at the sink with supervision and no more than min instructional cueing.   OT Short Term Goal 4 (Week 2): Pt iwll perform toilet transfer with min assist using sliding board to drop arm commode.    Skilled Therapeutic Interventions/Progress Updates:    Pt alert and oriented to person to start session.  He was also oriented to the city as well as the month and day of the week.  He was not oriented to the year.  Pt still not aware of reason for hospitalization, even when cued and shown the amputation, he cannot carry it over.  He was able to participate in bathing and dressing supine to sit.  Max assist for washing buttocks thoroughly with min assist needed for rolling side to side using rails.  Mod assist with mod demonstrational cueing to complete supine to sit.  Once sitting he was able to maintain balance with supervision while engaged in UB bathing.  Max instructional cueing for initiation of UB bathing.  Pt donned gown in sitting and then transitioned back to supine for donning under shorts over briefs in order to assist with sliding board transfers during PT.  Pt left in bed with bed alarm on, call button in reach, and phone in reach.  Pt not oriented to place or situation when questioned again at end of session.    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   Therapy/Group: Individual Therapy  Akeel Reffner OTR/L 08/08/2017, 3:43 PM

## 2017-08-08 NOTE — Plan of Care (Signed)
  Not Progressing Consults RH LIMB LOSS PATIENT EDUCATION Description Description: See Patient Education module for eduction specifics. 08/08/2017 1231 - Not Progressing by [name], [name], RN Skin Care Protocol Initiated - if Braden Score 18 or less Description If consults are not indicated, leave blank or document N/A 08/08/2017 1231 - Not Progressing by [name], [name], RN Diabetes Guidelines if Diabetic/Glucose > 140 Description If diabetic or lab glucose is > 140 mg/dl - Initiate Diabetes/Hyperglycemia Guidelines & Document Interventions  08/08/2017 1231 - Not Progressing by [name], [name], RN RH BOWEL ELIMINATION RH STG MANAGE BOWEL WITH ASSISTANCE Description STG Manage Bowel with min Assistance.  08/08/2017 1231 - Not Progressing by [name], [name], RN RH STG MANAGE BOWEL W/MEDICATION W/ASSISTANCE Description STG Manage Bowel with Medication with min Assistance.  08/08/2017 1231 - Not Progressing by [name], [name], RN RH SKIN INTEGRITY RH STG SKIN FREE OF INFECTION/BREAKDOWN Description Patients skin will remain free from further infection or breakdown with mod assist.  08/08/2017 1231 - Not Progressing by [name], [name], RN RH STG MAINTAIN SKIN INTEGRITY WITH ASSISTANCE Description STG Maintain Skin Integrity With mod Assistance.  08/08/2017 1231 - Not Progressing by [name], [name], RN RH STG ABLE TO PERFORM INCISION/WOUND CARE W/ASSISTANCE Description STG Able To Perform Incision/Wound Care With total Assistance.  08/08/2017 1231 - Not Progressing by [name], [name], RN RH SAFETY RH STG ADHERE TO SAFETY PRECAUTIONS W/ASSISTANCE/DEVICE Description STG Adhere to Safety Precautions With mod I Assistance/Device.  08/08/2017 1231 - Not Progressing by [name], [name], RN RH PAIN MANAGEMENT RH STG PAIN MANAGED AT OR BELOW PT'S PAIN GOAL Description Pain managed with use of scheduled and PRN medications with min assist  No pain zero pain  08/08/2017  1231 - Not Progressing by [name], [name], RN RH KNOWLEDGE DEFICIT LIMB LOSS RH STG INCREASE KNOWLEDGE OF SELF CARE AFTER LIMB LOSS Description Patient will verbalize understanding of limb loss education materials as presented by staff with min assist.  08/08/2017 1231 - Not Progressing by [name], [name], RN   Patient unable to retain any education provided r/t cognition.  No family present to educate.

## 2017-08-08 NOTE — Progress Notes (Signed)
Physical Therapy Session Note  Patient Details  Name: Justin Stone MRN: [id number] Date of Birth: 10-16-1960  Today's Date: 08/08/2017 PT Individual Time: [id number] PT Individual Time Calculation (min): 69 min   Short Term Goals: Week 2:  PT Short Term Goal 1 (Week 2): =LTGs due to ELOS  Skilled Therapeutic Interventions/Progress Updates:    Pt supine in bed upon PT arrival, agreeable to therapy tx and denies pain. Pt oriented x 1 this session (name), unable to determine location/date/situation. When cued to look at calander and sign on the wall, pt is able to read date and situation but unable to recall later in the session. Pt transferred from supine>sitting EOB with mod assist, tactile and manual cues for techniques and to reach for bedrails. Pt performed lateral scoot from bed<>TIS w/c with mod assist using slideboard, verbal and manual cues for techniques. Pt follows commands to carry out slideboard transfer but continued to ask why he could not just stand up, therapist reoriented pt to situation regarding bilateral amputations. Pt transported from room<>gym in TIS w/c total assist. Pt worked on cognitive remediation, attention and orientation this session. Pt able to use clock in the gym to determine time and determine how many minutes until dinner. Pt worked on [employer] Partnersnaming food items on bean bags and then throwing them into a bin, needed occasional reminders for the task but able to correctly name 90% of the time. Pt perseverated on "locking the door" and "do you have the keys" this session, reoriented by therapist. Pt completed simple peg board design (red/blue line), pt able to complete accurately however difficulty attending to the task requiring cues to complete task. Pt transported back to room in w/c. Pt transferred from w/c>bed with mod assist, lateral scoot using slideboard with verbal cues for technique and sequencing. Once on the EOB pt able to scoot laterally towards head of bed with min  assist. Pt transferred to supine with mod assist, once in supine pt able to scoot up in bed via pulling with UEs, min assist. Pt left seated in bed with needs in reach.   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   Therapy/Group: Individual Therapy  Cresenciano GenreEmily van Schagen, PT, DPT 08/08/2017, 7:53 AM

## 2017-08-08 NOTE — Progress Notes (Signed)
Speech Language Pathology Weekly Progress and Session Note  Patient Details  Name: Justin Stone MRN: [id number] Date of Birth: 1961/06/26  Beginning of progress report period: July 29, 2017 End of progress report period: August 08, 2017  Today's Date: 08/08/2017 SLP Individual Time: 0805-0900 SLP Individual Time Calculation (min): 55 min  Short Term Goals: Week 1: SLP Short Term Goal 1 (Week 1): Pt will recall basic, daily information with mod verbal cues for use of external aids.   SLP Short Term Goal 1 - Progress (Week 1): Not met SLP Short Term Goal 2 (Week 1): Pt will sustain his attention to basic, functional tasks for 5 minute intervals with mod verbal cues for redirection.   SLP Short Term Goal 2 - Progress (Week 1): Not met SLP Short Term Goal 3 (Week 1): Pt will complete basic, familiar tasks with mod assist for functional problem solving.   SLP Short Term Goal 3 - Progress (Week 1): Not met SLP Short Term Goal 4 (Week 1): Pt will recognize and correct errors when completing basic, familiar tasks with max assist.   SLP Short Term Goal 4 - Progress (Week 1): Not met    New Short Term Goals: Week 2: SLP Short Term Goal 1 (Week 2): STG=LTG due to ELOS  Weekly Progress Updates: Pt demonstrated no progress due to severe decline in cognition limited by confusion meeting 0 out 4 goals. Pt's goals have been downgraded to Maximum A in basic problem solving, sustained attention, recall of daily information, orientation and intellectual awareness. Skilled ST services are required to continue focusing on basic skills in order to reduce burden of care and maximize functional independence at SNF with 24/hour supervision required.      Intensity: Minumum of 1-2 x/day, 30 to 90 minutes Frequency: 3 to 5 out of 7 days Duration/Length of Stay: awaiting SNF Treatment/Interventions: Cognitive remediation/compensation;Cueing hierarchy;Functional tasks;Environmental  controls;Internal/external aids;Patient/family education   Daily Session  Skilled Therapeutic Interventions: Skilled ST services focused on cognitive goals. SLP facilitated recall of orientation information and safety protocol utilizing spaced retrieval and limiting internal and external distractions. Pt demonstrated recall of orientation information up to three minutes with max cues to limit internal distractions and eliminated external distarctions, given internal or external distractions pt demonstrated recall of information up to 1 minute with max verbal/visual cues for visual aid. Pt demonstrated recall of safety protocol up to three minutes with internal and external distractions. Pt demonstrated basic problem solving skills and sustained attention when consuming medication requiring mod-max A verbal cues. SLP reviewed progress with pt and pt stated understanding, however continues to demonstrate sever confusion. Pt was left in bed with call bell within reach. Recommend to continue skilled ST services.     Function:   Eating Eating                 Cognition Comprehension Comprehension assist level: Understands basic 50 - 74% of the time/ requires cueing 25 - 49% of the time  Expression   Expression assist level: Expresses basic 50 - 74% of the time/requires cueing 25 - 49% of the time. Needs to repeat parts of sentences.  Social Interaction Social Interaction assist level: Interacts appropriately 25 - 49% of time - Needs frequent redirection.  Problem Solving Problem solving assist level: Solves basic 25 - 49% of the time - needs direction more than half the time to initiate, plan or complete simple activities  Memory Memory assist level: Recognizes or recalls less than 25%  of the time/requires cueing greater than 75% of the time   General    Pain Pain Assessment Pain Assessment: No/denies pain Pain Score: 0-No pain  Therapy/Group: Individual Therapy  Kiora Hallberg   [facility] 08/08/2017, 10:58 AM

## 2017-08-08 NOTE — Progress Notes (Signed)
Administered wound care as ordered. Noted some dry dried.

## 2017-08-09 ENCOUNTER — Inpatient Hospital Stay (HOSPITAL_COMMUNITY): Payer: Medicare Other | Admitting: Physical Therapy

## 2017-08-09 ENCOUNTER — Inpatient Hospital Stay (HOSPITAL_COMMUNITY): Payer: Medicare Other | Admitting: Speech Pathology

## 2017-08-09 ENCOUNTER — Inpatient Hospital Stay (HOSPITAL_COMMUNITY): Payer: Medicare Other | Admitting: Occupational Therapy

## 2017-08-09 ENCOUNTER — Inpatient Hospital Stay (HOSPITAL_COMMUNITY): Payer: Medicare Other

## 2017-08-09 DIAGNOSIS — W19XXXS Unspecified fall, sequela: Secondary | ICD-10-CM

## 2017-08-09 LAB — URINE CULTURE: Culture: NO GROWTH

## 2017-08-09 LAB — GLUCOSE, CAPILLARY
GLUCOSE-CAPILLARY: 99 mg/dL (ref 65–99)
GLUCOSE-CAPILLARY: 132 mg/dL — AB (ref 65–99)
GLUCOSE-CAPILLARY: 106 mg/dL — AB (ref 65–99)
Glucose-Capillary: 106 mg/dL — ABNORMAL HIGH (ref 65–99)

## 2017-08-09 MED ORDER — QUETIAPINE FUMARATE 25 MG PO TABS
12.5000 mg | ORAL_TABLET | Freq: Every day | ORAL | Status: DC
  Administered 2017-08-09 – 2017-08-10 (×2): 12.5 mg via ORAL
  Filled 2017-08-09 (×2): qty 1

## 2017-08-09 MED ORDER — CALCIUM POLYCARBOPHIL 625 MG PO TABS
1250.0000 mg | ORAL_TABLET | Freq: Every day | ORAL | Status: DC
  Administered 2017-08-10 – 2017-08-18 (×9): 1250 mg via ORAL
  Filled 2017-08-09 (×9): qty 2

## 2017-08-09 NOTE — Progress Notes (Signed)
Occupational Therapy Weekly Progress Note  Patient Details  Name: Justin Stone MRN: [id number] Date of Birth: [birth year]  Beginning of progress report period: August 03, 2017 End of progress report period: August 09, 2017  Today's Date: 08/09/2017 OT Individual Time: 1449-1530 OT Individual Time Calculation (min): 41 min    Patient has met 0 of 4 short term goals.  Justin Stone has shown a decline in his performance of selfcare tasks over the past week.  His cognition has limited his ability to understand and follow directions related to selfcare tasks.  He continues to demonstrate deficits with intellectual awareness and orientation as well as confabulation of ideas and hallucinations of people in the room.  Transfers regressed to use of the maximove secondary to safety but now he is able to complete sliding board transfers at a mod assist level.  UB bathing is at a supervision level with max instructional cueing for initiation and completion.  UB dressing is also at a supervision level as well.  LB bathing and dressing are at a max assist level.  Feel he has begun to bounce back some over the last day or so with cognition and ADL performance but based on neurology consult, cognition may continue to fluctuate or decline.  Feel based on him living alone, he will likely need SNF for follow-up therapy and placement.  Until bed is found, feel CIR therapy is the best choice to continue and increase ADL independence.    Patient continues to demonstrate the following deficits: muscle weakness, decreased initiation, decreased attention, decreased awareness, decreased problem solving, decreased safety awareness, decreased memory and delayed processing and decreased sitting balance and decreased balance strategies and therefore will continue to benefit from skilled OT intervention to enhance overall performance with BADL.  Patient progressing toward long term goals..  Continue plan of care.  OT Short Term  Goals Week 3:  OT Short Term Goal 1 (Week 3): Continue working on established LTGs set a min assist level overall.  Skilled Therapeutic Interventions/Progress Updates:    Pt completed toilet transfers from bed to drop arm commode with mod assist and max instructional cueing during session.  Once on the toilet pt immediately reported the need to use the bathroom, however his shorts and brief were not down at the time.  He transferred back across the board and to supine to work on cleaning up BM and donning new brief.  Max instructional cueing for sequencing of rolling, with total assist for cleaning up and donning new brief.  Pt left in bed at end of session. Call button and phone in reach.  Bed alarm in place as well.  Not oriented to place, time, or situation.     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   Therapy/Group: Individual Therapy  Justin Stone OTR/L 08/09/2017, 4:16 PM

## 2017-08-09 NOTE — Consult Note (Signed)
History and Physical    Justin Stone YQM:[id number] DOB: 1961/06/30 DOA: 07/25/2017  PCP: Darreld Mclean, MD Patient coming from:  [facility]:  Brother  Chief Complaint: cognitive deficits  HPI: Justin Stone is a 57 y.o. male with medical history significant of HTn; HLD; GERD; DM; and now B BKA presenting with prior BKA, now with L BKA.  When asked how he was doing, he said "Everything, I'm just not used to it all."  Denies confusion.  Denies depression.  B leg pain, hand pain.    Per Dr. Posey Pronto from PMR: S/p L BKA, previously R BKA.  Cognitive decline - ?Wernicke's encephalopathy.  EEG negative, neurology has been involved.  Na++/LFTs still elevated.  Not improving so they would like to ensure that they are not missing anything that could be contributing.  Note reviewed from Dr. 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  The patient has been treated with high-dose IV thiamine - "however this is often a fairly debilitating long-term condition.... I suspect that he is likely to have significant long-term sequela from this."   Review of Systems: As per HPI; otherwise review of systems reviewed and negative.  This is limited by the patient's cognitive status  Ambulatory Status:  S/p B BKA   Past Medical History:  Diagnosis Date  . Allergy    eye allergies  . Anxiety   . Arthritis   . Diabetes mellitus without complication ([place])    takes oral meds now only  . GERD (gastroesophageal reflux disease)    past hx > 2 yrs ago  . Hepatitis    "Hepatitis C"- tx. with Harvoni- now testing negative.  . Hyperlipidemia   . Hypertension   . Neuromuscular disorder (HCC)    neuropathy hands/ feet.  . Osteopenia   . Prosthesis adjustment    right leg- below knee(weighs 10 lbs.)    Past Surgical History:  Procedure Laterality Date  . AMPUTATION Left 07/21/2017   Procedure: LEFT  BELOW KNEE AMPUTATION;  Surgeon: Wylene Simmer,  MD;  Location: [facility];  Service: Orthopedics;  Laterality: Left;  . HERNIA REPAIR Left    LIH  . INSERTION OF MESH N/A 06/15/2016   Procedure: INSERTION OF MESH;  Surgeon: Greer Pickerel, MD;  Location: [facility];  Service: General;  Laterality: N/A;  . right leg removal     for diabetes, wears prosthesis -Right Below knee since '15  . UMBILICAL HERNIA REPAIR N/A 06/15/2016   Procedure: LAPAROSCOPIC ASSISTED REPAIR OF  UMBILICAL HERNIA;  Surgeon: Greer Pickerel, MD;  Location: [facility];  Service: General;  Laterality: N/A;    Social History   Socioeconomic History  . Marital status: Single    Spouse name: Not on file  . Number of children: Not on file  . Years of education: Not on file  . Highest education level: Not on file  Social Needs  . Financial resource strain: Not on file  . Food insecurity - worry: Not on file  . Food insecurity - inability: Not on file  . Transportation needs - medical: Not on file  . Transportation needs - non-medical: Not on file  Occupational History  . Not on file  Tobacco Use  . Smoking status: Former Smoker    Packs/day: 1.00    Years: 20.00    Pack years: 20.00    Types: Cigarettes    Last attempt to quit: 06/10/1986    Years since  quitting: 31.1  . Smokeless tobacco: Never Used  Substance and Sexual Activity  . Alcohol use: Yes    Alcohol/week: 4.8 oz    Types: 8 Glasses of wine per week    Comment: 1 quart every 3 days - 24 oz cans  . Drug use: No  . Sexual activity: Not on file  Other Topics Concern  . Not on file  Social History Narrative  . Not on file    No Known Allergies  Family History  Problem Relation Age of Onset  . Diabetes Maternal Uncle   . Diabetes Maternal Grandmother   . Colon cancer Neg Hx   . Colon polyps Neg Hx   . Rectal cancer Neg Hx   . Stomach cancer Neg Hx   . Esophageal cancer Neg Hx   . Sickle cell anemia Neg Hx     Prior to Admission medications   Medication Sig Start Date End Date Taking? Authorizing  Provider  amLODipine (NORVASC) 10 MG tablet TAKE 1 TABLET(10 MG) BY MOUTH DAILY 09/27/16  Yes Copland, Gay Filler, MD  aspirin EC 81 MG tablet Take 81 mg by mouth daily.   Yes [provider]  azelastine (OPTIVAR) 0.05 % ophthalmic solution Place 1 drop into both eyes 2 (two) times daily. 03/22/16  Yes Brunetta Jeans, PA-C  blood glucose meter kit and supplies Dispense based on patient and insurance preference. Use up to four times daily as directed. (FOR ICD-9 250.00, 250.01). 12/03/16  Yes Copland, Gay Filler, MD  carisoprodol (SOMA) 350 MG tablet Take 1 tablet (350 mg total) by mouth 3 (three) times daily. 07/14/17  Yes Copland, Gay Filler, MD  enoxaparin (LOVENOX) 40 MG/0.4ML injection Inject 0.4 mLs (40 mg total) into the skin daily for 14 days. 07/26/17 08/09/17 Yes Oswald Hillock, MD  ferrous sulfate 325 (65 FE) MG tablet Take 1 tablet (325 mg total) by mouth 2 (two) times daily with a meal. If constipation take just one a day 01/20/17  Yes Copland, Gay Filler, MD  furosemide (LASIX) 40 MG tablet TAKE 1 TABLET(40 MG) BY MOUTH DAILY 09/20/16  Yes Copland, Gay Filler, MD  gabapentin (NEURONTIN) 300 MG capsule Take 4 capsules (1,200 mg total) by mouth 3 (three) times daily. 04/18/17  Yes Copland, Gay Filler, MD  glucose blood (ONETOUCH VERIO) test strip Check blood sugar daily as directed. 03/23/17  Yes Copland, Gay Filler, MD  Lancet Device MISC Use as directed twice a day.  Patient has one touch delica lancets, please give appropriate device. 07/06/17  Yes Copland, Gay Filler, MD  lisinopril (PRINIVIL,ZESTRIL) 10 MG tablet TAKE 1 TABLET BY MOUTH DAILY 07/12/17  Yes Copland, Gay Filler, MD  metFORMIN (GLUCOPHAGE) 500 MG tablet TAKE 2 TABLETS BY MOUTH IN THE MORNING AND 1 TABLET IN THE EVENING 08/09/16  Yes Copland, Gay Filler, MD  methocarbamol (ROBAXIN) 500 MG tablet Take 1 tablet (500 mg total) by mouth every 6 (six) hours as needed for muscle spasms. 07/25/17  Yes Oswald Hillock, MD  morphine (MS CONTIN)  15 MG 12 hr tablet Take 1 tablet (15 mg total) by mouth every 12 (twelve) hours. 04/18/17  Yes Copland, Gay Filler, MD  NARCAN 4 MG/0.1ML LIQD nasal spray kit CALL 911 AND U 1 SPR IN 1 NOS . REPEAT AFTRER 3 MIN IF NO OR MINIMAL RESPONSE 06/04/16  Yes [provider]  ONETOUCH DELICA LANCETS 72I MISC USE TO TEST BLOOD SUGAR TWICE DAILY 03/30/17  Yes Copland, Gay Filler, MD  Oxycodone HCl 10 MG TABS Take 0.5-1 tablets (5-10 mg total) by mouth every 4 (four) hours as needed. This is a 30 day supply.  To fill 60 days after rx 04/18/17  Yes Copland, Gay Filler, MD  Polyethyl Glycol-Propyl Glycol (SYSTANE OP) Place 1 drop into both eyes daily as needed (dry eyes).    Yes [provider]  polyethylene glycol (MIRALAX / GLYCOLAX) packet Take 17 g by mouth daily as needed for mild constipation. 07/25/17  Yes Oswald Hillock, MD  senna (SENOKOT) 8.6 MG TABS tablet Take 1 tablet (8.6 mg total) by mouth 2 (two) times daily. 07/25/17  Yes Oswald Hillock, MD  sildenafil (VIAGRA) 100 MG tablet Take 0.5-1 tablets (50-100 mg total) by mouth daily as needed for erectile dysfunction. 04/18/17  Yes Copland, Gay Filler, MD  simvastatin (ZOCOR) 10 MG tablet Take 1 tablet (10 mg total) by mouth at bedtime. 07/25/17  Yes Oswald Hillock, MD  traZODone (DESYREL) 50 MG tablet Take 0.5 tablets (25 mg total) by mouth at bedtime as needed for sleep. 10/07/16  Yes Copland, Gay Filler, MD  venlafaxine (EFFEXOR) 75 MG tablet Take 1 tablet (75 mg total) by mouth 2 (two) times daily with a meal. 07/25/17  Yes Oswald Hillock, MD    Physical Exam: Vitals:   08/08/17 1700 08/08/17 2014 08/09/17 0542 08/09/17 0748  BP: (!) 156/85 (!) 151/86 (!) 161/85 (!) 163/80  Pulse: (!) 105 (!) 103 (!) 108 (!) 101  Resp: 14  16 20   Temp: 98.3 F (36.8 C)  98.5 F (36.9 C)   TempSrc: Oral  Oral   SpO2: 96%  100% 97%  Weight:      Height:         General:  Appears calm and comfortable and is NAD Eyes:  EOMI, normal lids, iris ENT:   grossly normal hearing, lips & tongue, mmm Neck:  no LAD, masses or thyromegaly Cardiovascular:  RRR, no m/r/g. No LE edema.  Respiratory:   CTA bilaterally with no wheezes/rales/rhonchi.  Normal respiratory effort. Abdomen:  soft, NT, ND, NABS Skin:  no rash or induration seen on limited exam; he does have a patch on his R stump that is draining sanguinous drainage; his L BKA is in a brace s/p recent surgery Musculoskeletal: grossly normal tone BUE/BLE s/p B BKA, good ROM, no bony abnormality Psychiatric:  grossly normal mood and affect, speech fluent and appropriate, AOx1-2 - he was able to orient himself to place by looking at the sign in his room Neurologic:  CN 2-12 grossly intact, moves all extremities in coordinated fashion, sensation intact    Radiological Exams on Admission: No results found.  EKG: none recently   Labs on Admission: I have personally reviewed the available labs and imaging studies at the time of the admission.  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)   Post-operative pain   Type 2 diabetes mellitus with peripheral neuropathy (HCC)   Loose stools   Confusion, postoperative   Hypokalemia   Essential hypertension   Ileus, postoperative (HCC)   Encephalopathy   Abdominal distention   Confusion   Fall   -[facility] was asked to consult on this patient with  persistent confusion since BKA -He was seen by neurology who suggested that this is likely Korsakoff syndrome -Brain MRI diagnostic for Wernicke encephalopathy -Unfortunately,  -Would suggest ongoing magnesium daily repletion; as long as he is in rehab, would consider continuing 2 gm IV Magnesium daily -He also needs a good MVI due  to his increased risk for thiamine deficiency; while in rehab, it may be reasonable to continue giving thiamine 100 mg IM daily due to erratic oral absorption -After his acute recovery, he will need neuropsychiatric evaluation to document residual deficits. -He is recommended to sustain from alcohol ingestion for the remainder of his life. -His electrolytes otherwise appear to be improved. -His hemoglobin is stable at this time despite heme positive stools. -Would suggest ongoing intermittent monitoring of LFTs to ensure that they are improving. -It does not appear that [facility] has significantly more to provide to his treatment plan at this time; hopefully his neurologic recovery will continue to slowly improve.  Thank you for this consult.  Please feel free to reconsult if additional guidance is needed.    Karmen Bongo MD [facility]  If note is complete, please contact covering daytime or nighttime physician. [email or web address] Password [facility]  08/09/2017, 9:54 AM

## 2017-08-09 NOTE — Progress Notes (Signed)
Speech Language Pathology Daily Session Note  Patient Details  Name: Justin Stone MRN: [id number] Date of Birth: Aug 09, 1960  Today's Date: 08/09/2017 SLP Individual Time: 1137-1207 SLP Individual Time Calculation (min): 30 min  Short Term Goals: Week 2: SLP Short Term Goal 1 (Week 2): STG=LTG due to ELOS  Skilled Therapeutic Interventions:  Pt was seen for skilled ST targeting cognitive goals.  Pt needed mod-max assist multimodal cues to utilize his memory notebook to recall activities from previous therapy session and reorient to date.  Therapist facilitated the session with a novel card game to address problem solving and recall of new information.  Pt needed max faded to mod assist multimodal cues for use of written aids to facilitate working memory of task rules and procedures in order to effectively plan and execute a problem solving strategy.  Overall, pt appears much clearer today in comparison to previous therapy sessions.  Pt was left in wheelchair with call bell within reach.  Continue per current plan of care.    Function:  Eating Eating                 Cognition Comprehension Comprehension assist level: Understands basic 75 - 89% of the time/ requires cueing 10 - 24% of the time  Expression   Expression assist level: Expresses basic 75 - 89% of the time/requires cueing 10 - 24% of the time. Needs helper to occlude trach/needs to repeat words.  Social Interaction Social Interaction assist level: Interacts appropriately 75 - 89% of the time - Needs redirection for appropriate language or to initiate interaction.  Problem Solving Problem solving assist level: Solves basic 25 - 49% of the time - needs direction more than half the time to initiate, plan or complete simple activities  Memory Memory assist level: Recognizes or recalls less than 25% of the time/requires cueing greater than 75% of the time    Pain Pain Assessment Pain Assessment: No/denies  pain   Therapy/Group: Individual Therapy  Justin Stone, Justin Stone 08/09/2017, 12:14 PM

## 2017-08-09 NOTE — Progress Notes (Addendum)
[facility]     PROGRESS NOTE  Subjective/Complaints:  Pt seen laying in bed this AM.  No reported issues overnight.  He remains pleasantly confused.   ROS: Unreliable due to cognition.  Objective: Vital Signs: Blood pressure (!) 163/80, pulse (!) 101, temperature 98.5 F (36.9 C), temperature source Oral, resp. rate 20, height 5\' 7"  (1.702 m), weight 114 kg (251 lb 5.2 oz), SpO2 97 %. No results found. Recent Labs    08/08/17 1034  WBC 7.8  HGB 9.6*  HCT 30.9*  PLT 153   Recent Labs    08/08/17 1034  NA 138  K 3.4*  CL 113*  GLUCOSE 127*  BUN <5*  CREATININE 0.85  CALCIUM 8.6*   CBG (last 3)  Recent Labs    08/08/17 1650 08/08/17 1956 08/09/17 0644  GLUCAP 111* 107* 99    Wt Readings from Last 3 Encounters:  08/03/17 114 kg (251 lb 5.2 oz)  07/21/17 113.4 kg (250 lb)  07/21/17 113.8 kg (250 lb 12.8 oz)    Physical Exam:  BP (!) 163/80 (BP Location: Right Arm)   Pulse (!) 101   Temp 98.5 F (36.9 C) (Oral)   Resp 20   Ht 5\' 7"  (1.702 m)   Wt 114 kg (251 lb 5.2 oz)   SpO2 97%   BMI 39.36 kg/m  General: NAD. Vitals stable. HENT: Normocephalic. Atraumatic Eyes: EOMI. No discharge Cardiac. RRR.  No JVD. Respiratory. Clear to auscultation. Unlabored.  Abdomen: good bowel sounds. nondistended Musculoskeletal: Bilateral BKA Neurological. Alert Motor: B/l UE 5/5 proximal to distal LLE: HF 4-/5 (stable) RLE: HF, KE 4+/5 Skin. Left BKA with dressing C/D/I  Right BKA with mild sanguinous drainage at distal stump Psych: Confabulation  Assessment/Plan: 1. Functional deficits secondary to bilateral BKA which require 3+ hours per day of interdisciplinary therapy in a comprehensive inpatient rehab setting. Physiatrist is providing close team supervision and 24 hour management of active medical problems listed below. Physiatrist and rehab team continue to assess barriers to discharge/monitor patient progress toward  functional and medical goals.  Function:  Bathing Bathing position   Position: Sitting EOB  Bathing parts Body parts bathed by patient: Right arm, Chest, Abdomen, Front perineal area, Right upper leg, Left upper leg, Left arm Body parts bathed by helper: Buttocks, Back  Bathing assist Assist Level: Touching or steadying assistance(Pt > 75%)      Upper Body Dressing/Undressing Upper body dressing   What is the patient wearing?: Hospital gown     Pull over shirt/dress - Perfomed by patient: Thread/unthread right sleeve, Thread/unthread left sleeve, Put head through opening, Pull shirt over trunk Pull over shirt/dress - Perfomed by helper: Pull shirt over trunk        Upper body assist Assist Level: Supervision or verbal cues      Lower Body Dressing/Undressing Lower body dressing   What is the patient wearing?: Pants     Pants- Performed by patient: Thread/unthread left pants leg Pants- Performed by helper: Thread/unthread right pants leg, Thread/unthread left pants leg, Pull pants up/down                      Lower body assist Assist for lower body dressing: Touching or steadying assistance (Pt > 75%)      Toileting Toileting   Toileting steps completed by patient: Performs perineal hygiene Toileting steps completed by helper: Adjust clothing prior to toileting, Performs perineal hygiene, Adjust clothing after toileting  Toileting Assistive Devices: Prosthesis/orthosis  Toileting assist Assist level: Two helpers   Transfers Chair/bed transfer   Chair/bed transfer method: Lateral scoot Chair/bed transfer assist level: Moderate assist (Pt 50 - 74%/lift or lower) Chair/bed transfer assistive device: Sliding board Mechanical lift: Maximove   Locomotion Ambulation Ambulation activity did not occur: Safety/medical concerns         Wheelchair   Type: Manual Max wheelchair distance: 100' Assist Level: Supervision or verbal cues  Cognition Comprehension  Comprehension assist level: Understands basic 25 - 49% of the time/ requires cueing 50 - 75% of the time  Expression Expression assist level: Expresses basic 50 - 74% of the time/requires cueing 25 - 49% of the time. Needs to repeat parts of sentences.  Social Interaction Social Interaction assist level: Interacts appropriately 25 - 49% of time - Needs frequent redirection.  Problem Solving Problem solving assist level: Solves basic less than 25% of the time - needs direction nearly all the time or does not effectively solve problems and may need a restraint for safety  Memory Memory assist level: Recognizes or recalls less than 25% of the time/requires cueing greater than 75% of the time    Medical Problem List and Plan: 1.  Decreased functional mobility secondary to left BKA 07/21/2017 as well as history of right BKA. Patient does use a right prosthesis   Cont CIR  2.  DVT Prophylaxis/Anticoagulation: Subcutaneous Lovenox. Monitor for any bleeding episodes 3. Pain Management/chronic pain: Soma 350 mg 3 times a day, Neurontin 1200 mg 3 times a day, MS Contin 15 mg every 12 hours, Robaxin and oxycodone as needed 4. Mood: Effexor 75 mg twice a day Trazodone 25 mg daily at bedtime as needed 5. Neuropsych: This patient is capable of making decisions on his own behalf. 6. Skin/Wound Care: Routine skin checks   Monitor right BKA for breakdown after fall, stable 7. Fluids/Electrolytes/Nutrition: Routine I&O's   Cr. 0.85 on 1/14 8. Acute blood loss anemia. Continue iron supplement.    Hb 9.6 on 1/14   Hemoccult stools positive on 1/9   Cont to monitor 9. Diabetes mellitus and peripheral neuropathy. Hemoglobin A1c 6.5. Currently with SSI. Check blood sugars before meals and at bedtime. Diabetic teaching. Patient on Glucophage 500 mg twice a day prior to admission. Resume as needed   Metformin 500 daily started on 1/2, d/ced due to watery stool   Monitor with increased mobility CBG (last 3)  Recent  Labs    08/08/17 1650 08/08/17 1956 08/09/17 0644  GLUCAP 111* 107* 99    Relatively controlled on 1/15 10. Hypertension. Norvasc 10 mg daily.   Hydralazine 10 TID started on 1/9, increased to 25 on 1/10, increased to 50 on 1/12   Lisinopril 2.5 added on 1/12, increased to 10 on 1/13   Remains elevated, will consider further increase tomorrow 11. Hyperlipidemia. Zocor 12.  Ileus: Resolved, now with loose stools   Multifactorial, including cessation of chronic opiods   Pt also recently started on metformin will hold for now   KUB reviewed, improving   C. difficile neg   Fiber added 1/13, increased on 1/15 13. Hyponatremia: Resolved   Cont to monitor 14. Leukocytosis: Resolved   Afebrile      Cont to monitor 15.  RUE swelling negative duplex doppler 1/5 16.  Confusion   Likely wernikes encephalopathy   D/ced soma and changed oxyIR to Q6   Gabapentin reduced to 600mg  TID, d/ced on 1/9   MS contin changed to  tramadol   LFTs elevated, labs ordered for tomorrow   Ammonia WNL on 1/12, lactulose d/ced   UA unremarkable, U culture pending   Seroquel weaned on 1/15   CXR reviewed, showing likely atelectasis   EEG with generalized slowing   MRI consistent with Wernicke's encephalopathy    Appreciate neurology recs,signed off   Will speak with Hospitalist for potential additional recs 17. Hypokalemia   K 3.4 on 1/14, mag 1.6   IV mag and potassium ordered on 1/11, repeated on 1/12   Labs ordered for tomorrow  LOS (Days) 15 A FACE TO FACE EVALUATION WAS PERFORMED  Yazleemar Strassner Karis Juba 08/09/2017 7:59 AM

## 2017-08-09 NOTE — Plan of Care (Signed)
  Not Progressing RH BOWEL ELIMINATION RH STG MANAGE BOWEL WITH ASSISTANCE Description STG Manage Bowel with min Assistance.  08/09/2017 1028 - Not Progressing by Ferd Horrigan, Danella MaiersAshley M, RN RH STG MANAGE BOWEL W/MEDICATION W/ASSISTANCE Description STG Manage Bowel with Medication with min Assistance.  08/09/2017 1028 - Not Progressing by Caidance Sybert, Danella MaiersAshley M, RN RH SKIN INTEGRITY RH STG MAINTAIN SKIN INTEGRITY WITH ASSISTANCE Description STG Maintain Skin Integrity With mod Assistance.  08/09/2017 1028 - Not Progressing by Quantavis Obryant, Danella MaiersAshley M, RN RH STG ABLE TO PERFORM INCISION/WOUND CARE W/ASSISTANCE Description STG Able To Perform Incision/Wound Care With total Assistance.  08/09/2017 1028 - Not Progressing by Erhard Senske, Danella MaiersAshley M, RN RH KNOWLEDGE DEFICIT LIMB LOSS RH STG INCREASE KNOWLEDGE OF SELF CARE AFTER LIMB LOSS Description Patient will verbalize understanding of limb loss education materials as presented by staff with min assist.  08/09/2017 1028 - Not Progressing by Yolonda Kidaoyal, Jillian Pianka M, RN  Patient currently requiring max assist for all the above

## 2017-08-09 NOTE — Progress Notes (Signed)
Physical Therapy Session Note  Patient Details  Name: Justin Stone MRN: [id number] Date of Birth: [birth year]  Today's Date: 08/09/2017 PT Individual Time: 1331-1430, 1530-1600 PT Individual Time Calculation (min): 59 min, 30 min   Short Term Goals: Week 2:  PT Short Term Goal 1 (Week 2): =LTGs due to ELOS  Skilled Therapeutic Interventions/Progress Updates:    Session 1: Pt seated in w/c upon PT arrival, agreeable to therapy tx and denies pain at rest. Pt transported from room>gym. Pt oriented x 2 this session (place, name) but continues to perseverate on "having the keys" and "locking the door." Pt transferred from w/c<>mat with mod assist using slideboard, continues to ask why he is using slideboard and not standing, therapist reoriented to situation regarding B amputations. Pt transferred to supine with mod assist on mat, performed x 10 hip abduction. Pt transferred to sitting with max assist, verbal cues for techniques. Pt performed card matching activity working on sitting balance and attention, able to correctly match all cards. Pt transferred back to mat with mod assist using slideboard, manual facilitation for weightshifting. Pt transported back to room secondary to incontinence. Pt transferred from w/c>bed with mod assist using slideboard, increased time to complete secondary to pt confusion about why he was getting back in bed. Pt left in care of RN at end of session.   Session 2: Pt supine in bed upon PT arrival, agreeable to therapy tx and denies pain. Pt transferred from supine>sitting EOB with mod assist, using bedrails and manual facilitation from therapist for weightshifting. Pt worked on seated balance and cognitive remediation this session. Pt seated EOB worked on seated balance while reaching for playing cards to select suits as called and to select highest/lowest cards when asked. Pt worked on dynamic seated balance in order to complete peg board puzzle. Pt able to sustain  attention for brief periods of time, putting pegs in the correct place before forgetting the task. Pt requiring cues throughout to remind him of the task. Pt left supine in bed at end of session with needs in reach.   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   Therapy/Group: Individual Therapy  Cresenciano GenreEmily van Schagen, PT, DPT 08/09/2017, 7:52 AM

## 2017-08-09 NOTE — Progress Notes (Signed)
Occupational Therapy Session Note  Patient Details  Name: Justin Stone MRN: [id number] Date of Birth: 1961/06/09  Today's Date: 08/09/2017 OT Individual Time: [id number] OT Individual Time Calculation (min): 59 min    Short Term Goals: Week 2:  OT Short Term Goal 1 (Week 2): Pt will complete all UB and LB bathing sitting with lateral leans side to side and min assist.   OT Short Term Goal 2 (Week 2): Pt will donn pull up shorts or pants with min assist and lateral leans side to side.  OT Short Term Goal 3 (Week 2): Pt will complete 3 grooming tasks from wheelchair level at the sink with supervision and no more than min instructional cueing.   OT Short Term Goal 4 (Week 2): Pt iwll perform toilet transfer with min assist using sliding board to drop arm commode.    Skilled Therapeutic Interventions/Progress Updates:    Pt completed bathing and dressing supine to sit EOB.  Max instructional cueing throughout session to initiate and complete bathing.  He was able to roll in supine with min assist using the grab rails for support in order for therapist to assist with washing buttocks and applying cream.  He was able to wash his front peri area in supine with HOB slightly elevated.  Therapist provided total assist for donning brief and under shorts as well.  Next, had pt transition to sitting for work on UB bathing and donning of new gown.  Pt needed mod assist for transition from sidelying to sitting.  Once sitting he completed UB bathing with supervision and max instructional cueing for sequencing.  New gown donned secondary to IV running.  Pt then transferred to the wheelchair via sliding board with mod assist.  Once in the wheelchair he completed grooming tasks at the sink with min instructional cueing and setup. Pt left in tilt in space wheelchair with two safety belts in place.  Pt oriented to hospital  "[facility]" at start of session with min questioning cueing as well as month.  He was still not  oriented to reason for being in the hospital.  Therapy Documentation Precautions:  Precautions Precautions: Fall Required Braces or Orthoses: Other Brace/Splint Other Brace/Splint: Has LLE splint for positioning of amputation Restrictions Weight Bearing Restrictions: Yes LLE Weight Bearing: Non weight bearing Other Position/Activity Restrictions: has R prosthtic leg for R BKA (OLD); L BKA (NEW)  Pain: Pain Assessment Pain Assessment: Faces Pain Score: 5  Faces Pain Scale: Hurts a little bit Pain Type: Acute pain Pain Location: Buttocks Pain Orientation: Mid Pain Descriptors / Indicators: Burning Pain Onset: With Activity Pain Intervention(s): Repositioned ADL: See Function Navigator for Current Functional Status.   Therapy/Group: Individual Therapy  Jentry Warnell OTR/L 08/09/2017, 12:06 PM

## 2017-08-09 NOTE — Progress Notes (Signed)
Physical Therapy Session Note  Patient Details  Name: Justin PlateSheldon C Irby MRN: [id number] Date of Birth: 1961/03/02  Today's Date: 08/09/2017 PT Individual Time: 0900(make up time)-0925 PT Individual Time Calculation (min): 25 min   Short Term Goals: Week 2:  PT Short Term Goal 1 (Week 2): =LTGs due to ELOS  Skilled Therapeutic Interventions/Progress Updates:   Pt in w/c and agreeable to therapy, no c/o pain. Focused on cognitive remediation while sitting up to eat breakfast. Max verbal, visual, and tactile cues to initiate taking bites/sips of food, attend to task, and scan the environment for objects. Pt reporting needing to have bowel movement. Performed slide board transfer to EOB w/ mod assist overall for motor planning and attending to task, transferred to supine w/ max assist. Placed bedpan while pt able to roll w/ min guard. Pt w/ improved ability to wait until bed pan placed prior to initiating bowel movement. Ended session in supine, call bell within reach and pt in agreement to call for assistance when done w/ using bed pan - NT made aware of pt's status.   Therapy Documentation Precautions:  Precautions Precautions: Fall Required Braces or Orthoses: Other Brace/Splint Other Brace/Splint: Has LLE splint for positioning of amputation Restrictions Weight Bearing Restrictions: Yes LLE Weight Bearing: Non weight bearing Other Position/Activity Restrictions: has R prosthtic leg for R BKA (OLD); L BKA (NEW) Pain: Pain Assessment Pain Score: 5   See Function Navigator for Current Functional Status.   Therapy/Group: Individual Therapy  Jazper Nikolai K Arnette 08/09/2017, 12:02 PM

## 2017-08-10 ENCOUNTER — Inpatient Hospital Stay (HOSPITAL_COMMUNITY): Payer: Medicare Other

## 2017-08-10 ENCOUNTER — Inpatient Hospital Stay (HOSPITAL_COMMUNITY): Payer: Medicare Other | Admitting: Occupational Therapy

## 2017-08-10 ENCOUNTER — Inpatient Hospital Stay (HOSPITAL_COMMUNITY): Payer: Medicare Other | Admitting: Speech Pathology

## 2017-08-10 ENCOUNTER — Ambulatory Visit: Payer: Medicare Other | Admitting: Neurology

## 2017-08-10 DIAGNOSIS — E512 Wernicke's encephalopathy: Secondary | ICD-10-CM

## 2017-08-10 LAB — COMPREHENSIVE METABOLIC PANEL
ALT: 64 U/L — AB (ref 17–63)
ANION GAP: 9 (ref 5–15)
AST: 64 U/L — ABNORMAL HIGH (ref 15–41)
Albumin: 2.8 g/dL — ABNORMAL LOW (ref 3.5–5.0)
Alkaline Phosphatase: 119 U/L (ref 38–126)
BUN: <5 mg/dL — ABNORMAL LOW (ref 6–20)
CHLORIDE: 114 mmol/L — AB (ref 101–111)
CO2: 16 mmol/L — AB (ref 22–32)
CREATININE: 0.79 mg/dL (ref 0.61–1.24)
Calcium: 9.2 mg/dL (ref 8.9–10.3)
Glucose, Bld: 116 mg/dL — ABNORMAL HIGH (ref 65–99)
POTASSIUM: 4 mmol/L (ref 3.5–5.1)
Sodium: 139 mmol/L (ref 135–145)
Total Bilirubin: 1 mg/dL (ref 0.3–1.2)
Total Protein: 7 g/dL (ref 6.5–8.1)

## 2017-08-10 LAB — GLUCOSE, CAPILLARY
GLUCOSE-CAPILLARY: 119 mg/dL — AB (ref 65–99)
GLUCOSE-CAPILLARY: 119 mg/dL — AB (ref 65–99)
GLUCOSE-CAPILLARY: 92 mg/dL (ref 65–99)
Glucose-Capillary: 108 mg/dL — ABNORMAL HIGH (ref 65–99)

## 2017-08-10 MED ORDER — HYDRALAZINE HCL 50 MG PO TABS
50.0000 mg | ORAL_TABLET | Freq: Three times a day (TID) | ORAL | Status: DC
  Administered 2017-08-10 – 2017-08-12 (×6): 50 mg via ORAL
  Filled 2017-08-10 (×6): qty 1

## 2017-08-10 MED ORDER — POTASSIUM CHLORIDE CRYS ER 20 MEQ PO TBCR
20.0000 meq | EXTENDED_RELEASE_TABLET | Freq: Every day | ORAL | Status: DC
  Administered 2017-08-11 – 2017-08-12 (×2): 20 meq via ORAL
  Filled 2017-08-10 (×2): qty 1

## 2017-08-10 MED ORDER — MAGNESIUM SULFATE 2 GM/50ML IV SOLN
2.0000 g | Freq: Every day | INTRAVENOUS | Status: DC
  Administered 2017-08-10 – 2017-08-12 (×3): 2 g via INTRAVENOUS
  Filled 2017-08-10 (×3): qty 50

## 2017-08-10 NOTE — Progress Notes (Signed)
Justin Stone PHYSICAL MEDICINE & REHABILITATION     PROGRESS NOTE  Subjective/Complaints:  Pt seen sitting up in bed this AM.  No reported issues overnight.  Discussed with hospitalist yesterday.   ROS: Unreliable due to cognition.  Objective: Vital Signs: Blood pressure (!) 154/84, pulse (!) 103, temperature 98.2 F (36.8 C), temperature source Oral, resp. rate 16, height 5\' 7"  (1.702 m), weight 95.3 kg (210 lb 1.6 oz), SpO2 99 %. No results found. Recent Labs    08/08/17 1034  WBC 7.8  HGB 9.6*  HCT 30.9*  PLT 153   Recent Labs    08/08/17 1034 08/10/17 0525  NA 138 139  K 3.4* 4.0  CL 113* 114*  GLUCOSE 127* 116*  BUN <5* <5*  CREATININE 0.85 0.79  CALCIUM 8.6* 9.2   CBG (last 3)  Recent Labs    08/09/17 1644 08/09/17 2039 08/10/17 0602  GLUCAP 106* 106* 108*    Wt Readings from Last 3 Encounters:  08/10/17 95.3 kg (210 lb 1.6 oz)  07/21/17 113.4 kg (250 lb)  07/21/17 113.8 kg (250 lb 12.8 oz)    Physical Exam:  BP (!) 154/84 (BP Location: Right Arm)   Pulse (!) 103   Temp 98.2 F (36.8 C) (Oral)   Resp 16   Ht 5\' 7"  (1.702 m)   Wt 95.3 kg (210 lb 1.6 oz)   SpO2 99%   BMI 32.91 kg/m  General: NAD. Vitals stable. HENT: Normocephalic. Atraumatic Eyes: EOMI. No discharge Cardiac. RRR.  No JVD. Respiratory. Clear to auscultation. Unlabored.  Abdomen: good bowel sounds. nondistended Musculoskeletal: Bilateral BKA Neurological. Alert Motor: B/l UE 5/5 proximal to distal LLE: HF 4-/5 (unchanged) RLE: HF, KE 4+/5 Skin. Left BKA with dressing C/D/I  Right BKA with dressing c/d/i Psych: Confabulation  Assessment/Plan: 1. Functional deficits secondary to bilateral BKA which require 3+ hours per day of interdisciplinary therapy in a comprehensive inpatient rehab setting. Physiatrist is providing close team supervision and 24 hour management of active medical problems listed below. Physiatrist and rehab team continue to assess barriers to  discharge/monitor patient progress toward functional and medical goals.  Function:  Bathing Bathing position   Position: Sitting EOB  Bathing parts Body parts bathed by patient: Right arm, Chest, Abdomen, Right upper leg, Left upper leg, Left arm, Front perineal area Body parts bathed by helper: Buttocks  Bathing assist Assist Level: Touching or steadying assistance(Pt > 75%)      Upper Body Dressing/Undressing Upper body dressing   What is the patient wearing?: Hospital gown     Pull over shirt/dress - Perfomed by patient: Thread/unthread right sleeve, Thread/unthread left sleeve, Put head through opening, Pull shirt over trunk Pull over shirt/dress - Perfomed by helper: Pull shirt over trunk        Upper body assist Assist Level: Supervision or verbal cues      Lower Body Dressing/Undressing Lower body dressing   What is the patient wearing?: Pants     Pants- Performed by patient: Thread/unthread left pants leg Pants- Performed by helper: Thread/unthread right pants leg, Thread/unthread left pants leg, Pull pants up/down                      Lower body assist Assist for lower body dressing: Touching or steadying assistance (Pt > 75%)      Toileting Toileting   Toileting steps completed by patient: Performs perineal hygiene Toileting steps completed by helper: Adjust clothing prior to toileting, Performs perineal  hygiene, Adjust clothing after toileting Toileting Assistive Devices: Prosthesis/orthosis  Toileting assist Assist level: Two helpers   Transfers Chair/bed transfer   Chair/bed transfer method: Lateral scoot Chair/bed transfer assist level: Moderate assist (Pt 50 - 74%/lift or lower) Chair/bed transfer assistive device: Sliding board Mechanical lift: Maximove   Locomotion Ambulation Ambulation activity did not occur: Safety/medical concerns         Wheelchair   Type: Manual Max wheelchair distance: 100' Assist Level: Supervision or verbal  cues  Cognition Comprehension Comprehension assist level: Understands basic 75 - 89% of the time/ requires cueing 10 - 24% of the time  Expression Expression assist level: Expresses basic 75 - 89% of the time/requires cueing 10 - 24% of the time. Needs helper to occlude trach/needs to repeat words.  Social Interaction Social Interaction assist level: Interacts appropriately 75 - 89% of the time - Needs redirection for appropriate language or to initiate interaction.  Problem Solving Problem solving assist level: Solves basic 25 - 49% of the time - needs direction more than half the time to initiate, plan or complete simple activities  Memory Memory assist level: Recognizes or recalls less than 25% of the time/requires cueing greater than 75% of the time    Medical Problem List and Plan: 1.  Decreased functional mobility secondary to left BKA 07/21/2017 as well as history of right BKA. Patient does use a right prosthesis   Cont CIR  2.  DVT Prophylaxis/Anticoagulation: Subcutaneous Lovenox. Monitor for any bleeding episodes 3. Pain Management/chronic pain: Soma 350 mg 3 times a day, Neurontin 1200 mg 3 times a day, MS Contin 15 mg every 12 hours, Robaxin and oxycodone as needed 4. Mood: Effexor 75 mg twice a day Trazodone 25 mg daily at bedtime as needed 5. Neuropsych: This patient is capable of making decisions on his own behalf. 6. Skin/Wound Care: Routine skin checks   Monitor right BKA for breakdown after fall, stable 7. Fluids/Electrolytes/Nutrition: Routine I&O's   Cr. 0.85 on 1/14 8. Acute blood loss anemia. Continue iron supplement.    Hb 9.6 on 1/14   Hemoccult stools positive   Cont to monitor 9. Diabetes mellitus and peripheral neuropathy. Hemoglobin A1c 6.5. Currently with SSI. Check blood sugars before meals and at bedtime. Diabetic teaching. Patient on Glucophage 500 mg twice a day prior to admission. Resume as needed   Metformin 500 daily started on 1/2, d/ced due to watery  stool   Monitor with increased mobility CBG (last 3)  Recent Labs    08/09/17 1644 08/09/17 2039 08/10/17 0602  GLUCAP 106* 106* 108*    Relatively controlled on 1/16 10. Hypertension. Norvasc 10 mg daily.   Hydralazine 10 TID started on 1/9, increased to 25 on 1/10, increased to 50 on 1/16   Lisinopril 2.5 added on 1/12, increased to 10 on 1/13 11. Hyperlipidemia. Zocor 12.  Ileus: Resolved, now with loose stools   Multifactorial, including cessation of chronic opiods   Pt also recently started on metformin will hold for now   KUB reviewed, improving   C. difficile neg   Fiber added 1/13, increased on 1/15 13. Hyponatremia: Resolved   Cont to monitor 14. Leukocytosis: Resolved   Afebrile      Cont to monitor 15.  RUE swelling negative duplex doppler 1/5 16.  Confusion   Likely wernikes encephalopathy   D/ced soma and changed oxyIR to Q6   Gabapentin reduced to 600mg  TID, d/ced on 1/9   MS contin changed to tramadol  LFTs elevated, but improving.    Ammonia WNL on 1/12, lactulose d/ced   UA unremarkable, U culture NG   Seroquel weaned on 1/15, plan to d/c tomorrow   CXR reviewed, showing likely atelectasis   EEG with generalized slowing   MRI consistent with Wernicke's encephalopathy    Appreciate neurology recs,signed off   Discussed with Hospitalist, notes reviewed, appreciate recs   Thiamine daily 17. Hypokalemia/Hypomagnesemia   K 4.0 on 1/16, mag 1.6 on 1/14   IV mag daily   K+ 20 meq daily  LOS (Days) 16 A FACE TO FACE EVALUATION WAS PERFORMED  Ihan Pat Karis Jubanil Carlota Philley 08/10/2017 7:49 AM

## 2017-08-10 NOTE — Patient Care Conference (Signed)
Inpatient RehabilitationTeam Conference and Plan of Care Update Date: 08/10/2017   Time: 2:15 PM    Patient Name: Justin Stone      Medical Record Number: [id number]  Date of Birth: 1960/08/25 Sex: Male         Room/Bed: 4M05C/4M05C-01 Payor Info: Payor: [employer] / Plan: [employer] MEDICARE / Product Type: *No Product type* /    Admitting Diagnosis: bi lateral  bka  Admit Date/Time:  07/25/2017  3:05 PM Admission Comments: No comment available   Primary Diagnosis:  <principal problem not specified> Principal Problem: <principal problem not specified>  Patient Active Problem List   Diagnosis Date Noted  . Hypomagnesemia   . Wernicke encephalopathy   . Fall   . Abdominal distention   . Confusion   . Encephalopathy   . Essential hypertension   . Ileus, postoperative (HCC)   . Confusion, postoperative   . Hypokalemia   . Loose stools   . Post-operative pain   . Type 2 diabetes mellitus with peripheral neuropathy (HCC)   . Amputation of left lower extremity below knee (HCC) 07/25/2017  . S/P bilateral BKA (below knee amputation) (HCC)   . Unilateral complete BKA, left, sequela (HCC)   . Chronic pain syndrome   . Benign essential HTN   . Leukocytosis   . Acute blood loss anemia   . Tachycardia   . Hyponatremia   . AKI (acute kidney injury) (HCC) 07/22/2017  . Osteomyelitis of left foot (HCC)   . Diabetic wet gangrene of the foot (HCC) 07/21/2017  . Gangrene of left foot (HCC) 07/21/2017  . Unilateral complete BKA, left, initial encounter (HCC) 07/21/2017  . Insomnia 05/13/2016  . Umbilical hernia without obstruction and without gangrene 05/13/2016  . Spondylosis of lumbar region without myelopathy or radiculopathy 07/22/2015  . Obesity 06/29/2015  . 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 Type II diabetes mellitus, well controlled (HCC) 03/26/2015  . S/P BKA (below knee amputation), right (HCC) 03/26/2015  . Alcoholic cirrhosis of  liver without ascites (HCC) 02/22/2015  . Chronic hepatitis C without hepatic coma (HCC) 02/22/2015  . Chronic low back pain 12/29/2014  . Essential hypertension, benign 12/29/2014  . Hyperlipidemia LDL goal <100 12/29/2014    Expected Discharge Date: Expected Discharge Date: 08/06/17  Team Members Present: Physician leading conference: Dr. Maryla Morrow Social Worker Present: Dossie Der, LCSW Nurse Present: Chana Bode, RN PT Present: Woodfin Ganja, PT OT Present: Perrin Maltese, OT SLP Present: Jackalyn Lombard, SLP PPS Coordinator present : Tora Duck, RN, CRRN     Current Status/Progress Goal Weekly Team Focus  Medical   Decreased functional mobility secondary to left BKA 07/21/2017 as well as history of right BKA, now with Wernicke's encephalopathy  Improve mobility, safety, cognition  See above   Bowel/Bladder   Incontinent of B/B  free of constipation  Will be continent of B/B with min assist free of constipation  Assist with timed toileting laxatives as needed   Swallow/Nutrition/ Hydration             ADL's   Supervision for UB bathing with max assist for LB bathing in supine rolling.  Mod assist for sliding board transfers to the toilet with max assist for toilet hygiene and clothing management.  Still with decreased orientation, awareness, and cognitive processing.   downgraded to min assist overall  selfcare retraining, transfer training, balance retraining, functional transfers, pt/family education, cognitive retraining   Mobility   mod  assist for bed mobility, mod assist for slideboard transfers, total assist for w/c propulsion secondary to confusion/poor awareness  Mod I downgraded to mod assist overall w/c level on 08/02/17  cognitive remediation, carry-over, transfers, d/c planning, sitting balance   Communication             Safety/Cognition/ Behavioral Observations  max-total assist, slightly clearer mentation in comparison to last week   max assist, downgraded    continue to address orientation, basic recall, memory notebook, problem solving, safety awareness    Pain   denies pain at present,   <=3/10  Assess Q4H and prn medicate and assess for relief notify MD for uncontrolled pain   Skin   Surgical incision Left BKA, unstaged pressure wound R stump W-D foam dressing in place change BID, MASD buttocks groin and scrotum gerhardts and nyastatin  continue improvement of RLE wound, skin free from breakdown and infection, improvement of MASD  assess skin qshift and prn continue treatments as ordered      *See Care Plan and progress notes for long and short-term goals.     Barriers to Discharge  Current Status/Progress Possible Resolutions Date Resolved   Physician    Decreased caregiver support;Medical stability;Lack of/limited family support;Weight bearing restrictions     See above  Therapies, follow labs, optimize DM/BP meds, minimize cognitively affecting meds      Nursing  Lack of/limited family support;Medication compliance;Medical stability               PT  Decreased caregiver support;Lack of/limited family support;Home environment access/layout;Medication compliance;Weight bearing restrictions;Behavior                 OT                  SLP                SW Lack of/limited family support;Medical stability;Medication compliance Pt not medically stable for transfer to NH, will await MD input. Has no caregiver at home will need NHP            Discharge Planning/Teaching Needs:  Will need to pursue NHP once pt is medically stable to pursue, he does not have 24 hr care at home.       Team Discussion:  Pt making progress and able to participate in therapies, still confused and has cognitive deficits. Hospitalists consulted yesterday and gave recommendations. BS better and potassium has leveled off. Can be continent at times with urinal timed toilet for bowel. MD feels medically stable to pursue NHP. Using transfer board with therapies-mod  level at times needs plus 2. Begin looking for a bed  Revisions to Treatment Plan:  NHP    Continued Need for Acute Rehabilitation Level of Care: The patient requires daily medical management by a physician with specialized training in physical medicine and rehabilitation for the following conditions: Daily direction of a multidisciplinary physical rehabilitation program to ensure safe treatment while eliciting the highest outcome that is of practical value to the patient.: Yes Daily medical management of patient stability for increased activity during participation in an intensive rehabilitation regime.: Yes Daily analysis of laboratory values and/or radiology reports with any subsequent need for medication adjustment of medical intervention for : Post surgical problems;Diabetes problems;Wound care problems;Other  Lucy ChrisDupree, Trenise Turay G 08/10/2017, 3:44 PM

## 2017-08-10 NOTE — Progress Notes (Signed)
Occupational Therapy Session Note  Patient Details  Name: Justin Stone MRN: [id number] Date of Birth: [birth year]  Today's Date: 08/10/2017 OT Individual Time: 1101-1200 OT Individual Time Calculation (min): 59 min    Short Term Goals: Week 3:  OT Short Term Goal 1 (Week 3): Continue working on established LTGs set a min assist level overall.  Skilled Therapeutic Interventions/Progress Updates:    Pt completed bathing and dressing supine to sit EOB.  He was able to complete washing front peri area with supervision.  He then rolled side to side in supine for washing peri area and donning new brief, with max assist from therapist.  He was able to complete supine to sit with mod assist and then completed washing his UB with max instructional cueing to initiate and sequence.  Max assist for pulling brief over hips with lateral leans side to side.  He then completed sliding board transfer to the wheelchair with mod assist.  Pt left tilted back in wheelchair with call button and phone in reach and safety belts in place.    Therapy Documentation Precautions:  Precautions Precautions: Fall Required Braces or Orthoses: Other Brace/Splint Other Brace/Splint: Has LLE splint for positioning of amputation Restrictions Weight Bearing Restrictions: Yes LLE Weight Bearing: Non weight bearing Other Position/Activity Restrictions: has R prosthtic leg for R BKA (OLD); L BKA (NEW)  Pain: Pain Assessment Pain Assessment: No/denies pain ADL:  See Function Navigator for Current Functional Status.   Therapy/Group: Individual Therapy  Justin Stone OTR/L 08/10/2017, 12:55 PM

## 2017-08-10 NOTE — Progress Notes (Signed)
Physical Therapy Weekly Progress Note  Patient Details  Name: Justin Stone MRN: [id number] Date of Birth: [birth year]   Beginning of progress report period: August 02, 2017 End of progress report period: August 10, 2017  Today's Date: 08/10/2017 PT Individual Time: 1550-1700 PT Individual Time Calculation (min): 70 min   Patient is progressing towards long term goals of moderate assist for bed mobility and transfers however continues to be limited by cognitive impairments including confusion, impaired memory and decreased awareness. This week pt has shown improvements in orientation, alertness and ability to follow commands. The pt is becoming more familiar with his environment at times but this is very inconsistent. The patient is able to carry out functional tasks but is unable to connect functional tasks to his situation.   Patient continues to demonstrate the following deficits muscle weakness, decreased motor planning, decreased awareness, decreased problem solving, decreased memory and delayed processing and decreased sitting balance and therefore will continue to benefit from skilled PT intervention to increase functional independence with mobility.  Patient progressing toward long term goals..  Continue plan of care.  PT Short Term Goals Week 3:  PT Short Term Goal 1 (Week 3): LTG=STG due to estimated length of stay  Skilled Therapeutic Interventions/Progress Updates:    Pt seated in TIS w/c upon PT arrival, agreeable to therapy tx and denies pain. Pt transported to gym total assist. Session focused on seated balance, anterior weightshifting, attention to task and cognitive remediation. Pt oriented x 2 (name, month&year) this session. Pt able to determine that he was in physical therapist after therapist cued pt to look at name badge. Therapist reoriented pt to his situation and to place, pt able to recall therapists name throughout session when asked. Pt engaged in game of horseshoes  while seated in w/c, emphasis on forward leans to throw horse shoes. Therapist continued to work with patient throughout session on orientation to place and situation, pt unable to recall at end of session. Pt transported back to room in w/c. Pt transferred from w/c>bed using slideboard and mod assist, increased time to complete secondary to confusion about getting back in bed. Pt performed rolling with supervision in order to change briefs and for therapist to clean peri area. Pt transferred to supine and sat up in bed in order to doff dirty gown and don clean gown. Pt left supine in bed with needs in reach and bed alarm set.   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  Therapy/Group: Individual Therapy  Cresenciano GenreEmily van Schagen, PT, DPT 08/10/2017, 4:02 PM

## 2017-08-10 NOTE — Progress Notes (Signed)
Speech Language Pathology Daily Session Note  Patient Details  Name: Justin Stone MRN: [id number] Date of Birth: [birth year]  Today's Date: 08/10/2017 SLP Individual Time: 0800-0900 SLP Individual Time Calculation (min): 60 min  Short Term Goals: Week 2: SLP Short Term Goal 1 (Week 2): STG=LTG due to ELOS  Skilled Therapeutic Interventions:  Pt was seen for skilled ST targeting cognitive goals.  Pt needed max assist verbal cues for redirection to task when feeding himself breakfast.  Pt recognized therapist from previous therapy sessions but needed mod-max assist multimodal cues for use of name badge and memory notebook to recall name, therapy discipline, and goals of therapy.  Pt requested to use the bathroom after having been incontinent of urine and bowel.  He was able to follow directions during hygiene and donning clean brief with min assist.  Pt was left in bed with bed alarm set and call bell within reach.  Continue per current plan of care.    Function:  Eating Eating   Modified Consistency Diet: Yes Eating Assist Level: Supervision or verbal cues           Cognition Comprehension Comprehension assist level: Follows basic conversation/direction with extra time/assistive device  Expression   Expression assist level: Expresses basic 90% of the time/requires cueing < 10% of the time.  Social Interaction Social Interaction assist level: Interacts appropriately 75 - 89% of the time - Needs redirection for appropriate language or to initiate interaction.  Problem Solving Problem solving assist level: Solves basic 25 - 49% of the time - needs direction more than half the time to initiate, plan or complete simple activities  Memory Memory assist level: Recognizes or recalls less than 25% of the time/requires cueing greater than 75% of the time    Pain Pain Assessment Pain Assessment: No/denies pain  Therapy/Group: Individual Therapy  Justin Stone, Justin Stone 08/10/2017, 9:20 AM

## 2017-08-11 ENCOUNTER — Inpatient Hospital Stay (HOSPITAL_COMMUNITY): Payer: Medicare Other | Admitting: Physical Therapy

## 2017-08-11 ENCOUNTER — Inpatient Hospital Stay (HOSPITAL_COMMUNITY): Payer: Medicare Other | Admitting: Occupational Therapy

## 2017-08-11 ENCOUNTER — Inpatient Hospital Stay (HOSPITAL_COMMUNITY): Payer: Medicare Other | Admitting: Speech Pathology

## 2017-08-11 LAB — GLUCOSE, CAPILLARY
GLUCOSE-CAPILLARY: 122 mg/dL — AB (ref 65–99)
Glucose-Capillary: 100 mg/dL — ABNORMAL HIGH (ref 65–99)
Glucose-Capillary: 99 mg/dL (ref 65–99)
Glucose-Capillary: 104 mg/dL — ABNORMAL HIGH (ref 65–99)

## 2017-08-11 NOTE — Progress Notes (Signed)
[facility]     PROGRESS NOTE  Subjective/Complaints:  Patient seen lying in bed this morning. No issues reported overnight. He remains pleasantly confused.  ROS: Unreliable due to cognition.  Objective: Vital Signs: Blood pressure (!) 147/79, pulse (!) 102, temperature 98.2 F (36.8 C), temperature source Oral, resp. rate 16, height 5\' 7"  (1.702 m), weight 95.3 kg (210 lb 1.6 oz), SpO2 98 %. No results found. Recent Labs    08/08/17 1034  WBC 7.8  HGB 9.6*  HCT 30.9*  PLT 153   Recent Labs    08/08/17 1034 08/10/17 0525  NA 138 139  K 3.4* 4.0  CL 113* 114*  GLUCOSE 127* 116*  BUN <5* <5*  CREATININE 0.85 0.79  CALCIUM 8.6* 9.2   CBG (last 3)  Recent Labs    08/10/17 1704 08/10/17 2034 08/11/17 0622  GLUCAP 119* 92 100*    Wt Readings from Last 3 Encounters:  08/10/17 95.3 kg (210 lb 1.6 oz)  07/21/17 113.4 kg (250 lb)  07/21/17 113.8 kg (250 lb 12.8 oz)    Physical Exam:  BP (!) 147/79 (BP Location: Right Arm)   Pulse (!) 102   Temp 98.2 F (36.8 C) (Oral)   Resp 16   Ht 5\' 7"  (1.702 m)   Wt 95.3 kg (210 lb 1.6 oz)   SpO2 98%   BMI 32.91 kg/m  General: NAD. Vitals stable. HENT: Normocephalic. Atraumatic Eyes: EOMI. No discharge Cardiac. RRR.  No JVD. Respiratory. Clear to auscultation. Unlabored.  Abdomen: good bowel sounds. nondistended Musculoskeletal: Bilateral BKA Neurological. Alert Motor: B/l UE 5/5 proximal to distal LLE: HF 4-/5 (stable) RLE: HF, KE 4+/5 Skin. Left BKA with dressing C/D/I  Right BKA with dressing c/d/i Psych: Confabulation persistent  Assessment/Plan: 1. Functional deficits secondary to bilateral BKA which require 3+ hours per day of interdisciplinary therapy in a comprehensive inpatient rehab setting. Physiatrist is providing close team supervision and 24 hour management of active medical problems listed below. Physiatrist and rehab team continue to assess barriers to  discharge/monitor patient progress toward functional and medical goals.  Function:  Bathing Bathing position   Position: Sitting EOB  Bathing parts Body parts bathed by patient: Right arm, Chest, Abdomen, Right upper leg, Left upper leg, Left arm, Front perineal area Body parts bathed by helper: Buttocks  Bathing assist Assist Level: Touching or steadying assistance(Pt > 75%)      Upper Body Dressing/Undressing Upper body dressing   What is the patient wearing?: Hospital gown     Pull over shirt/dress - Perfomed by patient: Thread/unthread right sleeve, Thread/unthread left sleeve, Put head through opening, Pull shirt over trunk Pull over shirt/dress - Perfomed by helper: Pull shirt over trunk        Upper body assist Assist Level: Supervision or verbal cues      Lower Body Dressing/Undressing Lower body dressing   What is the patient wearing?: Pants     Pants- Performed by patient: Thread/unthread right pants leg, Thread/unthread left pants leg Pants- Performed by helper: Pull pants up/down                      Lower body assist Assist for lower body dressing: Touching or steadying assistance (Pt > 75%)      Toileting Toileting   Toileting steps completed by patient: Performs perineal hygiene Toileting steps completed by helper: Adjust clothing prior to toileting, Performs perineal hygiene, Adjust clothing after toileting Toileting Assistive  Devices: Prosthesis/orthosis  Toileting assist Assist level: Two helpers   Transfers Chair/bed transfer   Chair/bed transfer method: Lateral scoot Chair/bed transfer assist level: Moderate assist (Pt 50 - 74%/lift or lower) Chair/bed transfer assistive device: Sliding board Mechanical lift: Maximove   Locomotion Ambulation Ambulation activity did not occur: Safety/medical concerns         Wheelchair   Type: Manual Max wheelchair distance: 100' Assist Level: Supervision or verbal cues  Cognition Comprehension  Comprehension assist level: Understands basic 90% of the time/cues < 10% of the time  Expression Expression assist level: Expresses basic 90% of the time/requires cueing < 10% of the time.  Social Interaction Social Interaction assist level: Interacts appropriately 75 - 89% of the time - Needs redirection for appropriate language or to initiate interaction.  Problem Solving Problem solving assist level: Solves basic less than 25% of the time - needs direction nearly all the time or does not effectively solve problems and may need a restraint for safety  Memory Memory assist level: Recognizes or recalls less than 25% of the time/requires cueing greater than 75% of the time    Medical Problem List and Plan: 1.  Decreased functional mobility secondary to left BKA 07/21/2017 as well as history of right BKA. Patient does use a right prosthesis   Cont CIR  2.  DVT Prophylaxis/Anticoagulation: Subcutaneous Lovenox. Monitor for any bleeding episodes 3. Pain Management/chronic pain: Soma 350 mg 3 times a day, Neurontin 1200 mg 3 times a day, MS Contin 15 mg every 12 hours, Robaxin and oxycodone as needed 4. Mood: Effexor 75 mg twice a day Trazodone 25 mg daily at bedtime as needed 5. Neuropsych: This patient is capable of making decisions on his own behalf. 6. Skin/Wound Care: Routine skin checks 7. Fluids/Electrolytes/Nutrition: Routine I&O's   Cr. 0.79 on 1/16 8. Acute blood loss anemia. Continue iron supplement.    Hb 9.6 on 1/14   Hemoccult stools positive   Labs ordered for tomorrow   Cont to monitor 9. Diabetes mellitus and peripheral neuropathy. Hemoglobin A1c 6.5. Currently with SSI. Check blood sugars before meals and at bedtime. Diabetic teaching. Patient on Glucophage 500 mg twice a day prior to admission. Resume as needed   Metformin 500 daily started on 1/2, d/ced due to watery stool   Monitor with increased mobility CBG (last 3)  Recent Labs    08/10/17 1704 08/10/17 2034  08/11/17 0622  GLUCAP 119* 92 100*    Relatively controlled on 1/17 10. Hypertension. Norvasc 10 mg daily.   Hydralazine 10 TID started on 1/9, increased to 25 on 1/10, increased to 50 on 1/16   Lisinopril 2.5 added on 1/12, increased to 10 on 1/13   Appears to be improving on 1/17 11. Hyperlipidemia. Zocor 12.  Ileus: Resolved, now with loose stools   Multifactorial, including cessation of chronic opiods   Pt also recently started on metformin will hold for now   KUB reviewed, improving   C. difficile neg   Fiber added 1/13, increased on 1/15   Appears to be improving 13. Hyponatremia: Resolved   Cont to monitor 14. Leukocytosis: Resolved   Afebrile      Cont to monitor 15.  RUE swelling negative duplex doppler 1/5 16.  Confusion   Likely wernikes encephalopathy   D/ced soma and changed oxyIR to Q6   Gabapentin reduced to 600mg  TID, d/ced on 1/9   MS contin changed to tramadol   LFTs elevated, but improving.  Ammonia WNL on 1/12, lactulose d/ced   UA unremarkable, U culture NG   Seroquel weaned on 1/15, DC'd on 1/17   CXR reviewed, showing likely atelectasis   EEG with generalized slowing   MRI consistent with Wernicke's encephalopathy    Appreciate neurology recs,signed off   Discussed with Hospitalist, notes reviewed, appreciate recs, signed off   Thiamine daily 17. Hypokalemia/Hypomagnesemia   K 4.0 on 1/16, mag 1.6 on 1/14   IV mag daily   K+ 20 meq daily   Labs ordered for tomorrow  LOS (Days) 17 A FACE TO FACE EVALUATION WAS PERFORMED  Jenay Morici Karis Juba 08/11/2017 8:24 AM

## 2017-08-11 NOTE — Progress Notes (Signed)
Speech Language Pathology Daily Session Note  Patient Details  Name: Justin Stone MRN: [id number] Date of Birth: 1960-08-13  Today's Date: 08/11/2017 SLP Individual Time: 1030-1130 SLP Individual Time Calculation (min): 60 min  Short Term Goals: Week 2: SLP Short Term Goal 1 (Week 2): STG=LTG due to ELOS  Skilled Therapeutic Interventions:  Pt was seen for skilled ST targeting cognitive goals.  Pt was pleasantly interactive upon therapist's arrival and was agreeable to participating in ST.  Pt clearly recognizes the faces of familiar staff members as evidenced by him calling out and waving to his primary OT as he walked by with another pt; however, he continues to need max to total cues for recall of goals of therapy or orientation information.  Pt was able to sequence 4 step picture cards for 50% accuracy.   Pt was only able to successfully sequence picture cards if he was accurate on first attempt as he was noted to become more confused and disorganized with cues to correct errors.  Pt needed mod assist verbal cues for regulation of tangentiality/language of confusion to identify safety concerns in pictures as well as to identify similarities and differences between images.  Pt needed total assist to identify matches between picture cards from a field of nine given decreased working memory of task rules and procedures.  Pt was returned to his room and left in wheelchair with call bell within reach.  Continue per current plan of care.    Function:  Eating Eating   Modified Consistency Diet: Yes Eating Assist Level: More than reasonable amount of time;Set up assist for;Supervision or verbal cues   Eating Set Up Assist For: Opening containers;Cutting food       Cognition Comprehension Comprehension assist level: Understands basic 75 - 89% of the time/ requires cueing 10 - 24% of the time  Expression   Expression assist level: Expresses basic 75 - 89% of the time/requires cueing 10 - 24%  of the time. Needs helper to occlude trach/needs to repeat words.  Social Interaction Social Interaction assist level: Interacts appropriately 50 - 74% of the time - May be physically or verbally inappropriate.  Problem Solving Problem solving assist level: Solves basic 25 - 49% of the time - needs direction more than half the time to initiate, plan or complete simple activities  Memory Memory assist level: Recognizes or recalls less than 25% of the time/requires cueing greater than 75% of the time    Pain Pain Assessment Pain Assessment: No/denies pain   Therapy/Group: Individual Therapy  Zanden Colver, [name] 08/11/2017, 11:43 AM

## 2017-08-11 NOTE — Progress Notes (Signed)
Occupational Therapy Session Note  Patient Details  Name: Justin PlateSheldon C Siegmann MRN: [id number] Date of Birth: 22-Feb-1961  Today's Date: 08/11/2017 OT Individual Time: 1450-1534 OT Individual Time Calculation (min): 44 min    Short Term Goals: Week 3:  OT Short Term Goal 1 (Week 3): Continue working on established LTGs set a min assist level overall.  Skilled Therapeutic Interventions/Progress Updates:    Pt completed transfer from wheelchair to therapy mat with mod assist using the sliding board.  Once on the mat, therapist noted wetness on his underpants secondary to bladder incontinence.  Transferred back to the wheelchair with max assist and then back to the room.  He was then able to transfer to the bed in the room for work on cleaning peri area and donning new brief.  Supervision for rolling side to side with max assist for cleaning peri area and donning brief.  Pt left in bed at end of session with bed alarm in place and call button and phone in reach.  Pt was able to recall therapist name "Fayrene FearingJames" at start of session but still not oriented to place, time, or situation.  Continued confabulation of ideas and statements as well including pt stating his brother was in the next room and thinking the he is at home as well.    Therapy Documentation Precautions:  Precautions Precautions: Fall Required Braces or Orthoses: Other Brace/Splint Other Brace/Splint: Has LLE splint for positioning of amputation Restrictions Weight Bearing Restrictions: No LLE Weight Bearing: Non weight bearing Other Position/Activity Restrictions: has R prosthtic leg for R BKA (OLD); L BKA (NEW)  Pain: Pain Assessment Pain Assessment: No/denies pain ADL: See Function Navigator for Current Functional Status.   Therapy/Group: Individual Therapy  Lakeesha Fontanilla OTR/L 08/11/2017, 3:59 PM

## 2017-08-11 NOTE — Progress Notes (Signed)
Occupational Therapy Session Note  Patient Details  Name: Justin Stone MRN: [id number] Date of Birth: 12-19-1960  Today's Date: 08/11/2017 OT Individual Time: 9458-5929 OT Individual Time Calculation (min): 60 min    Short Term Goals: Week 1:  OT Short Term Goal 1 (Week 1): Pt will complete BSC transfer with S OT Short Term Goal 1 - Progress (Week 1): Not met OT Short Term Goal 2 (Week 1): Pt will complete LB washing with S OT Short Term Goal 2 - Progress (Week 1): Not met OT Short Term Goal 3 (Week 1): Pt will complete grooming at sink with S OT Short Term Goal 3 - Progress (Week 1): Not met OT Short Term Goal 4 (Week 1): Pt will use w/c to gather clothes in room for dressing task with S  OT Short Term Goal 4 - Progress (Week 1): Not met Week 2:  OT Short Term Goal 1 (Week 2): Pt will complete all UB and LB bathing sitting with lateral leans side to side and min assist.   OT Short Term Goal 1 - Progress (Week 2): Not met OT Short Term Goal 2 (Week 2): Pt will donn pull up shorts or pants with min assist and lateral leans side to side.  OT Short Term Goal 2 - Progress (Week 2): Not met OT Short Term Goal 3 (Week 2): Pt will complete 3 grooming tasks from wheelchair level at the sink with supervision and no more than min instructional cueing.   OT Short Term Goal 3 - Progress (Week 2): Not met OT Short Term Goal 4 (Week 2): Pt iwll perform toilet transfer with min assist using sliding board to drop arm commode.   OT Short Term Goal 4 - Progress (Week 2): Not met Week 3:  OT Short Term Goal 1 (Week 3): Continue working on established LTGs set a min assist level overall.  Skilled Therapeutic Interventions/Progress Updates:    1:1 P tin bed working on breakfast when arrived. Self care retraining including bathing and dressing at bed level. Supine with rolling with supervision for periarea and bottom hygiene. Min A to come to EOB to complete bathing UB and dressing with lateral leans for  clothing management. Pt was able to perform slide board transfer with min A once slide board setup. Pt participated in tooth brushing at sink once sat up.  Pt was oriented to month and year on 2nd attempt but not aware he was in the hospital post amputation. Pt pleasantly confused again today, Pt did participate in cogntive map finding tasks using functional problem solving and navigation with min to mod A with extra time. Left in tilt in space w/c with quick release.  Therapy Documentation Precautions:  Precautions Precautions: Fall Required Braces or Orthoses: Other Brace/Splint Other Brace/Splint: Has LLE splint for positioning of amputation Restrictions Weight Bearing Restrictions: Yes LLE Weight Bearing: Non weight bearing Other Position/Activity Restrictions: has R prosthtic leg for R BKA (OLD); L BKA (NEW) Pain: No c/o in session _0 See Function Navigator for Current Functional Status.   Therapy/Group: Individual Therapy  Willeen Cass [facility] 08/11/2017, 12:04 PM

## 2017-08-11 NOTE — Progress Notes (Addendum)
Physical Therapy Session Note  Patient Details  Name: Justin Stone MRN: [id number] Date of Birth: 04-27-1961  Today's Date: 08/11/2017 PT Individual Time: 1615-1710 PT Individual Time Calculation (min): 55 min   Short Term Goals: Week 3:  PT Short Term Goal 1 (Week 3): LTG=STG due to estimated length of stay  Skilled Therapeutic Interventions/Progress Updates:   Pt supine and agreeable to therapy, no c/o pain. Focused on independence w/ functional mobility, tolerance to upright activity w/o back support, and cognitive remediation. Transitioned to L sidelying and then to sitting EOB w/ min assist and verbal cues for technique. Maintained static sitting balance at EOB while performing pill sorting task w/ close supervision and verbal cues for safety. Emphasis on maintaining balance w/ anterior weight shift to get close enough to table. Mod assist overall to complete pill sorting task, verbal/visual/tactile cues to initiate task and attend to task. Played word games w/ therapist w/ min-mod assist, emphasis on following multi-step commands, short term memory, and attention to task. Able to consistently follow 1 or 2-step commands this session w/o therapist repeating commands. Practiced multiple sit<>supine transfers w/ verbal and tactile cues for technique and min assist overall, able to verbalize assistance needed from therapist by 3rd trial. Ended session sitting up in supine, call bell within reach and all needs met.   Therapy Documentation Precautions:  Precautions Precautions: Fall Required Braces or Orthoses: Other Brace/Splint Other Brace/Splint: Has LLE splint for positioning of amputation Restrictions Weight Bearing Restrictions: No LLE Weight Bearing: Non weight bearing Other Position/Activity Restrictions: has R prosthtic leg for R BKA (OLD); L BKA (NEW) Vital Signs: Therapy Vitals Temp: 98.3 F (36.8 C) Temp Source: Oral Pulse Rate: (!) 107 Resp: 20 BP: (!) 143/67 Patient  Position (if appropriate): Sitting Oxygen Therapy SpO2: 97 % O2 Device: Not Delivered Pain: Pain Assessment Pain Assessment: No/denies pain   See Function Navigator for Current Functional Status.   Therapy/Group: Individual Therapy  Ariyah Sedlack K Arnette 08/11/2017, 5:20 PM

## 2017-08-12 ENCOUNTER — Inpatient Hospital Stay (HOSPITAL_COMMUNITY): Payer: Medicare Other | Admitting: Occupational Therapy

## 2017-08-12 ENCOUNTER — Inpatient Hospital Stay (HOSPITAL_COMMUNITY): Payer: Medicare Other

## 2017-08-12 ENCOUNTER — Inpatient Hospital Stay (HOSPITAL_COMMUNITY): Payer: Medicare Other | Admitting: Speech Pathology

## 2017-08-12 LAB — CBC WITH DIFFERENTIAL/PLATELET
BASOS ABS: 0 10*3/uL (ref 0.0–0.1)
BASOS PCT: 1 %
Eosinophils Absolute: 0.4 10*3/uL (ref 0.0–0.7)
Eosinophils Relative: 6 %
HEMATOCRIT: 28.5 % — AB (ref 39.0–52.0)
HEMOGLOBIN: 8.9 g/dL — AB (ref 13.0–17.0)
LYMPHS PCT: 19 %
Lymphs Abs: 1.2 10*3/uL (ref 0.7–4.0)
MCH: 30.7 pg (ref 26.0–34.0)
MCHC: 31.2 g/dL (ref 30.0–36.0)
MCV: 98.3 fL (ref 78.0–100.0)
Monocytes Absolute: 0.9 10*3/uL (ref 0.1–1.0)
Monocytes Relative: 14 %
NEUTROS ABS: 3.9 10*3/uL (ref 1.7–7.7)
NEUTROS PCT: 60 %
Platelets: 135 10*3/uL — ABNORMAL LOW (ref 150–400)
RBC: 2.9 MIL/uL — AB (ref 4.22–5.81)
RDW: 15.1 % (ref 11.5–15.5)
WBC: 6.3 10*3/uL (ref 4.0–10.5)

## 2017-08-12 LAB — GLUCOSE, CAPILLARY
GLUCOSE-CAPILLARY: 103 mg/dL — AB (ref 65–99)
Glucose-Capillary: 109 mg/dL — ABNORMAL HIGH (ref 65–99)
Glucose-Capillary: 128 mg/dL — ABNORMAL HIGH (ref 65–99)
Glucose-Capillary: 184 mg/dL — ABNORMAL HIGH (ref 65–99)

## 2017-08-12 LAB — COMPREHENSIVE METABOLIC PANEL
ALBUMIN: 2.8 g/dL — AB (ref 3.5–5.0)
ALK PHOS: 119 U/L (ref 38–126)
ALT: 58 U/L (ref 17–63)
AST: 62 U/L — AB (ref 15–41)
Anion gap: 8 (ref 5–15)
CALCIUM: 8.8 mg/dL — AB (ref 8.9–10.3)
CO2: 17 mmol/L — ABNORMAL LOW (ref 22–32)
CREATININE: 0.86 mg/dL (ref 0.61–1.24)
Chloride: 114 mmol/L — ABNORMAL HIGH (ref 101–111)
GFR calc Af Amer: >60 mL/min (ref >60)
GLUCOSE: 118 mg/dL — AB (ref 65–99)
POTASSIUM: 3.5 mmol/L (ref 3.5–5.1)
Sodium: 139 mmol/L (ref 135–145)
TOTAL PROTEIN: 6.8 g/dL (ref 6.5–8.1)
Total Bilirubin: 0.9 mg/dL (ref 0.3–1.2)

## 2017-08-12 LAB — MAGNESIUM: Magnesium: 1.7 mg/dL (ref 1.7–2.4)

## 2017-08-12 MED ORDER — HYDRALAZINE HCL 50 MG PO TABS
75.0000 mg | ORAL_TABLET | Freq: Three times a day (TID) | ORAL | Status: DC
  Administered 2017-08-12 – 2017-08-18 (×18): 75 mg via ORAL
  Filled 2017-08-12 (×18): qty 1

## 2017-08-12 MED ORDER — ENSURE ENLIVE PO LIQD
237.0000 mL | Freq: Two times a day (BID) | ORAL | Status: DC
  Administered 2017-08-12 – 2017-08-18 (×12): 237 mL via ORAL

## 2017-08-12 MED ORDER — POTASSIUM CHLORIDE CRYS ER 20 MEQ PO TBCR
20.0000 meq | EXTENDED_RELEASE_TABLET | Freq: Two times a day (BID) | ORAL | Status: DC
  Administered 2017-08-12 – 2017-08-16 (×8): 20 meq via ORAL
  Filled 2017-08-12 (×7): qty 1

## 2017-08-12 NOTE — Progress Notes (Addendum)
[facility]IT[facility]     PROGRESS NOTE  Subjective/Complaints:  Patient seen sitting up in bed this morning. No reported issues overnight.  ROS: Unreliable due to cognition.  Objective: Vital Signs: Blood pressure 131/64, pulse (!) 106, temperature 98.3 F (36.8 C), temperature source Oral, resp. rate 18, height 5\' 7"  (1.702 m), weight 95.3 kg (210 lb 1.6 oz), SpO2 96 %. No results found. Recent Labs    08/12/17 0443  WBC 6.3  HGB 8.9*  HCT 28.5*  PLT 135*   Recent Labs    08/10/17 0525 08/12/17 0443  NA 139 139  K 4.0 3.5  CL 114* 114*  GLUCOSE 116* 118*  BUN <5* <5*  CREATININE 0.79 0.86  CALCIUM 9.2 8.8*   CBG (last 3)  Recent Labs    08/11/17 1716 08/11/17 2109 08/12/17 0629  GLUCAP 99 104* 109*    Wt Readings from Last 3 Encounters:  08/10/17 95.3 kg (210 lb 1.6 oz)  07/21/17 113.4 kg (250 lb)  07/21/17 113.8 kg (250 lb 12.8 oz)    Physical Exam:  BP 131/64 (BP Location: Right Arm)   Pulse (!) 106   Temp 98.3 F (36.8 C) (Oral)   Resp 18   Ht 5\' 7"  (1.702 m)   Wt 95.3 kg (210 lb 1.6 oz)   SpO2 96%   BMI 32.91 kg/m  General: NAD. Vitals stable. HENT: Normocephalic. Atraumatic Eyes: EOMI. No discharge Cardiac. RRR.  No JVD. Respiratory. Clear to auscultation. unlabored.  Abdomen: good bowel sounds. nondistended Musculoskeletal: Bilateral BKA Neurological. Alert Motor: B/l UE 5/5 proximal to distal LLE: HF, KE 4+/5  RLE: HF, KE 4+/5 Skin. Left BKA with dressing C/D/I  Right BKA with dressing c/d/i Psych: Confabulation unchanged  Assessment/Plan: 1. Functional deficits secondary to bilateral BKA which require 3+ hours per day of interdisciplinary therapy in a comprehensive inpatient rehab setting. Physiatrist is providing close team supervision and 24 hour management of active medical problems listed below. Physiatrist and rehab team continue to assess barriers to discharge/monitor patient progress toward  functional and medical goals.  Function:  Bathing Bathing position   Position: Sitting EOB  Bathing parts Body parts bathed by patient: Right arm, Chest, Abdomen, Right upper leg, Left upper leg, Left arm, Front perineal area Body parts bathed by helper: Buttocks  Bathing assist Assist Level: Set up      Upper Body Dressing/Undressing Upper body dressing   What is the patient wearing?: Hospital gown     Pull over shirt/dress - Perfomed by patient: Thread/unthread right sleeve, Thread/unthread left sleeve, Put head through opening, Pull shirt over trunk Pull over shirt/dress - Perfomed by helper: Pull shirt over trunk        Upper body assist Assist Level: Set up   Set up : To obtain clothing/put away  Lower Body Dressing/Undressing Lower body dressing   What is the patient wearing?: Pants     Pants- Performed by patient: Thread/unthread right pants leg Pants- Performed by helper: Thread/unthread left pants leg, Pull pants up/down, Thread/unthread right pants leg                      Lower body assist Assist for lower body dressing: Touching or steadying assistance (Pt > 75%)      Toileting Toileting   Toileting steps completed by patient: Performs perineal hygiene Toileting steps completed by helper: Adjust clothing prior to toileting, Performs perineal hygiene, Adjust clothing after toileting Toileting Assistive Devices:  Grab bar or rail, Prosthesis/orthosis  Toileting assist Assist level: Two helpers   Transfers Chair/bed transfer   Chair/bed transfer method: Lateral scoot Chair/bed transfer assist level: Touching or steadying assistance (Pt > 75%) Chair/bed transfer assistive device: Sliding board Mechanical lift: Maximove   Locomotion Ambulation Ambulation activity did not occur: Safety/medical concerns         Wheelchair   Type: Manual Max wheelchair distance: 100' Assist Level: Supervision or verbal cues  Cognition Comprehension  Comprehension assist level: Understands basic 75 - 89% of the time/ requires cueing 10 - 24% of the time  Expression Expression assist level: Expresses basic 75 - 89% of the time/requires cueing 10 - 24% of the time. Needs helper to occlude trach/needs to repeat words.  Social Interaction Social Interaction assist level: Interacts appropriately 25 - 49% of time - Needs frequent redirection.  Problem Solving Problem solving assist level: Solves basic 25 - 49% of the time - needs direction more than half the time to initiate, plan or complete simple activities  Memory Memory assist level: Recognizes or recalls 25 - 49% of the time/requires cueing 50 - 75% of the time    Medical Problem List and Plan: 1.  Decreased functional mobility secondary to left BKA 07/21/2017 as well as history of right BKA. Patient does use a right prosthesis   Cont CIR  2.  DVT Prophylaxis/Anticoagulation: Subcutaneous Lovenox. Monitor for any bleeding episodes 3. Pain Management/chronic pain: Soma 350 mg 3 times a day, Neurontin 1200 mg 3 times a day, MS Contin 15 mg every 12 hours, Robaxin and oxycodone as needed 4. Mood: Effexor 75 mg twice a day Trazodone 25 mg daily at bedtime as needed 5. Neuropsych: This patient is capable of making decisions on his own behalf. 6. Skin/Wound Care: Routine skin checks 7. Fluids/Electrolytes/Nutrition: Routine I&O's   Cr. 0.86 on 1/18 8. Acute blood loss anemia. Continue iron supplement.    Hb 8.9 on 1/18   Hemoccult stools positive   Labs ordered for Monday   Cont to monitor 9. Diabetes mellitus and peripheral neuropathy. Hemoglobin A1c 6.5. Currently with SSI. Check blood sugars before meals and at bedtime. Diabetic teaching. Patient on Glucophage 500 mg twice a day prior to admission. Resume as needed   Metformin 500 daily started on 1/2, d/ced due to watery stool   Monitor with increased mobility CBG (last 3)  Recent Labs    08/11/17 1716 08/11/17 2109 08/12/17 0629   GLUCAP 99 104* 109*    Relatively controlled on 1/18 10. Hypertension. Norvasc 10 mg daily.   Hydralazine 10 TID started on 1/9, increased to 25 on 1/10, increased to 50 on 1/16, increased to 75 on 1/18   Lisinopril 2.5 added on 1/12, increased to 10 on 1/13 11. Hyperlipidemia. Zocor 12.  Ileus: Resolved, now with loose stools   Multifactorial, including cessation of chronic opiods   Pt also recently started on metformin will hold for now   KUB reviewed, improving   C. difficile neg   Fiber added 1/13, increased on 1/15   Compounded by magnesium supplementation   Appears to be improving 13. Hyponatremia: Resolved   Cont to monitor 14. Leukocytosis: Resolved   Afebrile      Cont to monitor 15.  RUE swelling negative duplex doppler 1/5 16.  Confusion   Likely wernikes encephalopathy   D/ced soma and changed oxyIR to Q6   Gabapentin reduced to 600mg  TID, d/ced on 1/9   MS contin changed to tramadol  LFTs elevated, gradually improving.    Ammonia WNL on 1/12, lactulose d/ced   UA unremarkable, U culture NG   Seroquel weaned on 1/15, DC'd on 1/17   CXR reviewed, showing likely atelectasis   EEG with generalized slowing   MRI consistent with Wernicke's encephalopathy    Appreciate neurology recs,signed off   Discussed with Hospitalist, notes reviewed, appreciate recs, signed off   Thiamine daily 17. Hypokalemia/Hypomagnesemia   K 3.5 on 1/18   Mag 1.7 on 1/18   IV mag daily, will transition to by mouth at discharge   K+ 20 meq daily, increased to 20 twice a day on 1/18   Labs ordered for Monday 18. Thrombocytopenia   Platelets 135 on 1/18   Labs ordered for [place]  LOS (Days) 18 A FACE TO FACE EVALUATION WAS PERFORMED  Ankit Karis Jubanil Patel 08/12/2017 8:10 AM

## 2017-08-12 NOTE — Progress Notes (Signed)
Physical Therapy Session Note  Patient Details  Name: Justin PlateSheldon C [name] MRN: [id number] Date of Birth: 12-02-1960  Today's Date: 08/12/2017 PT Individual Time: [id number] PT Individual Time Calculation (min): 58 min   Short Term Goals: Week 3:  PT Short Term Goal 1 (Week 3): LTG=STG due to estimated length of stay  Skilled Therapeutic Interventions/Progress Updates:    Session focused on cognitive remediation during functional mobility tasks. Due to impaired attention, problem solving, and sequencing, pt requires up to mod assist to complete bed mobility and significant amount of extra time with several attempts to come to EOB to prepare for transfer OOB. Required min to mod assist for slideboard transfer from bed -> w/c with multimodal cues for hand placement and technique as well as attention to task. Pt request to brush teeth this morning, so set-up at sink in w/c. Pt requires max cues for sequencing of task and extra time but able to complete with supervision assistance. In quiet environment in ortho gym, pt given simple pipe tree puzzle task to complete. Required 20 min to complete (a cross image) with max cues for problem solving and even simplified for pt by giving only needed pieces to select from. Requires redirection to task throughout. End of session set up in w/c semi reclined and safety belt donned with all needs in reach. RN made aware that IV was alarming. Pt oriented to self and place this morning.   Therapy Documentation Precautions:  Precautions Precautions: Fall Required Braces or Orthoses: Other Brace/Splint Other Brace/Splint: Has LLE splint for positioning of amputation Restrictions Weight Bearing Restrictions: Yes LLE Weight Bearing: Non weight bearing Other Position/Activity Restrictions: has R prosthtic leg for R BKA (OLD); L BKA (NEW)   Pain: No reports of pain, just itchiness on his back.   See Function Navigator for Current Functional Status.   Therapy/Group:  Individual Therapy  [name], [name]  [name], PT, DPT  08/12/2017, 11:56 AM

## 2017-08-12 NOTE — Progress Notes (Signed)
Speech Language Pathology Daily Session Note  Patient Details  Name: Justin [name] MRN: [id number] Date of Birth: 07-06-1961  Today's Date: 08/12/2017 SLP Individual Time: 1440-1525 SLP Individual Time Calculation (min): 45 min  Short Term Goals: Week 2: SLP Short Term Goal 1 (Week 2): STG=LTG due to ELOS  Skilled Therapeutic Interventions:  Pt was seen for skilled ST targeting cognitive goals.  Pt had not eaten lunch yet due to therapy schedule; therefore, SLP provided tray set up and mod assist verbal cues for redirection to task to feed himself his meal in a timely manner.  Pt required mod assist verbal and visual cues to utilize memory notebook for recall of activities from previous therapy sessions as well as to use therapist's name tag to recall her name.   Pt was left in wheelchair with visitor present.  Continue per current plan of care.     Function:  Eating Eating   Modified Consistency Diet: Yes Eating Assist Level: More than reasonable amount of time;Set up assist for;Supervision or verbal cues   Eating Set Up Assist For: Opening containers;Cutting food       Cognition Comprehension Comprehension assist level: Understands basic 75 - 89% of the time/ requires cueing 10 - 24% of the time  Expression   Expression assist level: Expresses basic 90% of the time/requires cueing < 10% of the time.  Social Interaction Social Interaction assist level: Interacts appropriately 90% of the time - Needs monitoring or encouragement for participation or interaction.  Problem Solving Problem solving assist level: Solves basic 25 - 49% of the time - needs direction more than half the time to initiate, plan or complete simple activities  Memory Memory assist level: Recognizes or recalls less than 25% of the time/requires cueing greater than 75% of the time    Pain Pain Assessment Pain Assessment: No/denies pain  Therapy/Group: Individual Therapy  Maretta Overdorf, [name] 08/12/2017, 3:32  PM

## 2017-08-12 NOTE — Progress Notes (Signed)
Nutrition Follow-up  DOCUMENTATION CODES:   Obesity unspecified  INTERVENTION:  D/c Boost Breeze po TID  Ensure Enlive po BID, each supplement provides 350kcals and 20g of protein.  NUTRITION DIAGNOSIS:   Inadequate oral intake related to altered GI function(ileus) as evidenced by (clear liquid diet 7 days). Advanced diet to Dysphagia 3 on 1/17 - Progressing.  GOAL:   Patient will meet greater than or equal to 90% of their needs Progressing.  MONITOR:   Diet advancement, PO intake, Supplement acceptance, Weight trends, Labs, Skin, I & O's  REASON FOR ASSESSMENT:   (Clear liquid diet 7 days)    ASSESSMENT:   57 year old right-handed male with history of diabetes mellitus, chronic pain syndrome, hypertension, right BKA several years ago. Presented 07/21/2017 with ulceration to left foot and no change with conservative care. X-rays of left foot consistent with cellulitis and osteomyelitis. Underwent left BKA 07/21/2017  RD and dietetic intern interviewed pt following diet advancement from clear liquid diet due to ileus. Ileus has resolved and pt reports feeling well. Dietetic intern attempted to obtain dietary history however, pt was confused due to possible Wernikes encephalopathy.   Per recorded meal intake, pt consumed 75% of lunch and 25% of dinner yesterday (1/17).  The pt reports he consumed one Boost Breeze today. He was amenable to trying Ensure Enlive (chocolate) instead of Boost Breeze to increase his protein intake.   NUTRITION - FOCUSED PHYSICAL EXAM:    Most Recent Value  Orbital Region  No depletion  Upper Arm Region  No depletion  Thoracic and Lumbar Region  No depletion  Buccal Region  No depletion  Temple Region  No depletion  Clavicle Bone Region  No depletion  Clavicle and Acromion Bone Region  No depletion  Scapular Bone Region  No depletion  Dorsal Hand  No depletion  Patellar Region  Unable to assess  Anterior Thigh Region  Unable to assess   Posterior Calf Region  Unable to assess  Edema (RD Assessment)  Unable to assess  Hair  Unable to assess  Eyes  Unable to assess  Mouth  Unable to assess  Skin  Unable to assess  Nails  Unable to assess       Diet Order:  DIET DYS 3 Room service appropriate? Yes; Fluid consistency: Thin  EDUCATION NEEDS:   Not appropriate for education at this time  Skin:  Skin Assessment: Skin Integrity Issues: Skin Integrity Issues:: Unstageable, Incisions Unstageable: R leg Incisions: L leg  Last BM:  1/14(type 7, small amount)  Height:   Ht Readings from Last 1 Encounters:  07/25/17 5\' 7"  (1.702 m)    Weight:   Wt Readings from Last 1 Encounters:  08/10/17 210 lb 1.6 oz (95.3 kg)  Based on previous recorded weights, this weight may be inaccurate.   Ideal Body Weight:  58 kg(adjusted for bilateral BKA)  BMI:  Body mass index is 32.91 kg/m.  Estimated Nutritional Needs:   Kcal:  2000-2200  Protein:  105-115 grams  Fluid:  2-2.2 L/day  Tasnia Spegal, MS, Dietetic Intern Pager # [phone]

## 2017-08-12 NOTE — Progress Notes (Signed)
Occupational Therapy Session Note  Patient Details  Name: Justin [name] MRN: [id number] Date of Birth: Mar 12, 1961  Today's Date: 08/12/2017 OT Individual Time: 7741-2878 OT Individual Time Calculation (min): 71 min    Short Term Goals: Week 1:  OT Short Term Goal 1 (Week 1): Pt will complete BSC transfer with S OT Short Term Goal 1 - Progress (Week 1): Not met OT Short Term Goal 2 (Week 1): Pt will complete LB washing with S OT Short Term Goal 2 - Progress (Week 1): Not met OT Short Term Goal 3 (Week 1): Pt will complete grooming at sink with S OT Short Term Goal 3 - Progress (Week 1): Not met OT Short Term Goal 4 (Week 1): Pt will use w/c to gather clothes in room for dressing task with S  OT Short Term Goal 4 - Progress (Week 1): Not met  Skilled Therapeutic Interventions/Progress Updates:    1:1. Pt recived with HOB elevated set up for breakfast by RN. Pt requesting to keep working on breakfast and requires MAX VC for attention to eating breakfast with door open/tv on d/t decreased selective attention. Pt easily distracted by voices/sounds in hallway or content on tv requiring cueing to attend to task. Pt engages in orientation questions while eating. Pt able to use external aides with 2 question cues to be oriented to self and location. Pt states he is at the hospital because, "the food I was eating wasn't good so I came back." Pt completes bathing at bed level with A for washing buttocks and back. Pt dons hospital gown and pants with A to thread LLE into pant legs and advance pants past hips. Pt requires touching A for rolling B to advance pants past hips. Pt requires increased time and multi modal cueing to initiate, sequence and attend to tasks. Pt verbalizes, "Oh I need to stand up now" for advancing pants past hips. OT reorients pt to situation and place and pt able to say it is not safe to stand. Exited session with pt seated in bed, call light in reach and all needs met.  Therapy  Documentation Precautions:  Precautions Precautions: Fall Required Braces or Orthoses: Other Brace/Splint Other Brace/Splint: Has LLE splint for positioning of amputation Restrictions Weight Bearing Restrictions: Yes LLE Weight Bearing: Non weight bearing Other Position/Activity Restrictions: has R prosthtic leg for R BKA (OLD); L BKA (NEW) General:   Vital Signs:   See Function Navigator for Current Functional Status.   Therapy/Group: Individual Therapy  Tonny Branch 08/12/2017, 8:21 AM

## 2017-08-12 NOTE — Progress Notes (Signed)
Social Work Patient ID: Justin [name], male   DOB: 11/20/1960, 57 y.o.   MRN: [id number]  Met with pt and spoke with brother via telephone to discuss team conference progress this week and MD feeling he will be medicaly stable early next week for me to pursue NHP. Brother voiced the facility will need to fax him the paperwork to sing him in due to he is not able to come up here from [place]. Pt wants to be close to home which is [place]. His friends are there also. Pt continues to be confused. Will begin working on NH bed early next week.

## 2017-08-12 NOTE — Progress Notes (Signed)
Occupational Therapy Session Note  Patient Details  Name: Justin [name] MRN: [id number] Date of Birth: 1961/03/29  Today's Date: 08/12/2017 OT Individual Time: 5621-30861401-1433 OT Individual Time Calculation (min): 32 min    Short Term Goals: Week 3:  OT Short Term Goal 1 (Week 3): Continue working on established LTGs set a min assist level overall.  Skilled Therapeutic Interventions/Progress Updates:    Pt pleasant but still confused.  He was not able to state therapist's name and did not recall the name of the visitor that was in his room at the start of session.  [facility] and pointed to his LEs when asked why he was here.  However, he was not able to state specifically that he had surgery to remove his LLE.  Took pt to the orthopedic gym where he transferred to the therapy mat with mod assist using the sliding board.  Once on the mat had pt attempt to complete simple PVC pipe puzzle from picture diagram given.  Max questioning cueing needed to complete this.  Also had pt attempt second slightly harder puzzle as well, but again needed max demonstrational cueing to complete it.  Transferred back to the wheelchair with max assist using the sliding board and then to the returned to the room.  Safety belt in place and call button and phone in reach.  Pt's ex-sister-in-law visiting at end of session.    Therapy Documentation Precautions:  Precautions Precautions: Fall Required Braces or Orthoses: Other Brace/Splint Other Brace/Splint: Has LLE splint for positioning of amputation Restrictions Weight Bearing Restrictions: Yes LLE Weight Bearing: Non weight bearing Other Position/Activity Restrictions: has R prosthtic leg for R BKA (OLD); L BKA (NEW)  Pain: Pain Assessment Pain Assessment: No/denies pain ADL: See Function Navigator for Current Functional Status.   Therapy/Group: Individual Therapy  Jameca Chumley OTR/L 08/12/2017, 3:51 PM

## 2017-08-13 ENCOUNTER — Inpatient Hospital Stay (HOSPITAL_COMMUNITY): Payer: Medicare Other | Admitting: Physical Therapy

## 2017-08-13 LAB — GLUCOSE, CAPILLARY
GLUCOSE-CAPILLARY: 120 mg/dL — AB (ref 65–99)
GLUCOSE-CAPILLARY: 125 mg/dL — AB (ref 65–99)
Glucose-Capillary: 118 mg/dL — ABNORMAL HIGH (ref 65–99)
Glucose-Capillary: 111 mg/dL — ABNORMAL HIGH (ref 65–99)

## 2017-08-13 NOTE — Progress Notes (Signed)
Patient bed alarm sounded and found at edge of bed with legs dangling down, touch down on floor. When prompted patient leaned back and lifted legs back off floor. Had urinated on floor. Reoriented patient. Dressings re-changed and no changes to prior assessment. Intact sutures, no bruising or redness.

## 2017-08-13 NOTE — Progress Notes (Signed)
[facility]IC[facility] & REHABILITATION     PROGRESS NOTE  Subjective/Complaints:  No new complaints.  Patient sitting up in bed and just finished breakfast.  Denies pain.  ROS: Limited due to cognitive/behavioral    Objective: Vital Signs: Blood pressure 135/82, pulse (!) 107, temperature 98.6 F (37 C), temperature source Oral, resp. rate 18, height 5\' 7"  (1.702 m), weight 95.3 kg (210 lb 1.6 oz), SpO2 96 %. No results found. Recent Labs    08/12/17 0443  WBC 6.3  HGB 8.9*  HCT 28.5*  PLT 135*   Recent Labs    08/12/17 0443  NA 139  K 3.5  CL 114*  GLUCOSE 118*  BUN <5*  CREATININE 0.86  CALCIUM 8.8*   CBG (last 3)  Recent Labs    08/12/17 1643 08/12/17 2105 08/13/17 0648  GLUCAP 184* 103* 118*    Wt Readings from Last 3 Encounters:  08/10/17 95.3 kg (210 lb 1.6 oz)  07/21/17 113.4 kg (250 lb)  07/21/17 113.8 kg (250 lb 12.8 oz)    Physical Exam:  BP 135/82   Pulse (!) 107   Temp 98.6 F (37 C) (Oral)   Resp 18   Ht 5\' 7"  (1.702 m)   Wt 95.3 kg (210 lb 1.6 oz)   SpO2 96%   BMI 32.91 kg/m  General: NAD. Vitals stable. HENT: Normocephalic. Atraumatic Eyes: EOMI. No discharge Cardiac. RRR without murmur. No JVD . Respiratory. Clear to auscultation. unlabored.  Abdomen: good bowel sounds. nondistended Musculoskeletal: Bilateral BKA Neurological. Alert Motor: B/l UE 5/5 proximal to distal LLE: HF, KE 4+/5  RLE: HF, KE 4+/5 Skin. Left BKA with dressing C/D/I  Right BKA with dressing c/d/i Psych: Confabulation unchanged  Assessment/Plan: 1. Functional deficits secondary to bilateral BKA which require 3+ hours per day of interdisciplinary therapy in a comprehensive inpatient rehab setting. Physiatrist is providing close team supervision and 24 hour management of active medical problems listed below. Physiatrist and rehab team continue to assess barriers to discharge/monitor patient progress toward functional and medical  goals.  Function:  Bathing Bathing position   Position: Sitting EOB  Bathing parts Body parts bathed by patient: Right arm, Chest, Abdomen, Right upper leg, Left upper leg, Left arm, Front perineal area Body parts bathed by helper: Buttocks  Bathing assist Assist Level: Set up      Upper Body Dressing/Undressing Upper body dressing   What is the patient wearing?: Hospital gown     Pull over shirt/dress - Perfomed by patient: Thread/unthread right sleeve, Thread/unthread left sleeve, Put head through opening, Pull shirt over trunk Pull over shirt/dress - Perfomed by helper: Pull shirt over trunk        Upper body assist Assist Level: Set up   Set up : To obtain clothing/put away  Lower Body Dressing/Undressing Lower body dressing   What is the patient wearing?: Pants     Pants- Performed by patient: Thread/unthread right pants leg Pants- Performed by helper: Thread/unthread left pants leg, Pull pants up/down, Thread/unthread right pants leg                      Lower body assist Assist for lower body dressing: Touching or steadying assistance (Pt > 75%)      Toileting Toileting   Toileting steps completed by patient: Performs perineal hygiene Toileting steps completed by helper: Adjust clothing prior to toileting, Performs perineal hygiene, Adjust clothing after toileting Toileting Assistive Devices: Grab bar or rail, Prosthesis/orthosis  Toileting assist Assist level: Two helpers   Transfers Chair/bed transfer   Chair/bed transfer method: Lateral scoot Chair/bed transfer assist level: Moderate assist (Pt 50 - 74%/lift or lower) Chair/bed transfer assistive device: Sliding board Mechanical lift: Maximove   Locomotion Ambulation Ambulation activity did not occur: Safety/medical concerns         Wheelchair   Type: Manual Max wheelchair distance: 100' Assist Level: Dependent (Pt equals 0%)(TIS)  Cognition Comprehension Comprehension assist level:  Understands basic 75 - 89% of the time/ requires cueing 10 - 24% of the time  Expression Expression assist level: Expresses basic 75 - 89% of the time/requires cueing 10 - 24% of the time. Needs helper to occlude trach/needs to repeat words.  Social Interaction Social Interaction assist level: Interacts appropriately 75 - 89% of the time - Needs redirection for appropriate language or to initiate interaction.  Problem Solving Problem solving assist level: Solves basic 25 - 49% of the time - needs direction more than half the time to initiate, plan or complete simple activities  Memory Memory assist level: Recognizes or recalls less than 25% of the time/requires cueing greater than 75% of the time    Medical Problem List and Plan: 1.  Decreased functional mobility secondary to left BKA 07/21/2017 as well as history of right BKA. Patient does use a right prosthesis   Cont CIR  2.  DVT Prophylaxis/Anticoagulation: Subcutaneous Lovenox. Monitor for any bleeding episodes 3. Pain Management/chronic pain: Soma 350 mg 3 times a day, Neurontin 1200 mg 3 times a day, MS Contin 15 mg every 12 hours, Robaxin and oxycodone as needed 4. Mood: Effexor 75 mg twice a day Trazodone 25 mg daily at bedtime as needed 5. Neuropsych: This patient is capable of making decisions on his own behalf. 6. Skin/Wound Care: Routine skin checks 7. Fluids/Electrolytes/Nutrition: Routine I&O's   Cr. 0.86 on 1/18 8. Acute blood loss anemia. Continue iron supplement.    Hb 8.9 on 1/18   Hemoccult stools positive.  No gross hematuria   Labs ordered for Monday   Cont to monitor 9. Diabetes mellitus and peripheral neuropathy. Hemoglobin A1c 6.5. Currently with SSI. Check blood sugars before meals and at bedtime. Diabetic teaching. Patient on Glucophage 500 mg twice a day prior to admission. Resume as needed   Metformin 500 daily started on 1/2, d/ced due to watery stool   Monitor with increased mobility CBG (last 3)  Recent Labs     08/12/17 1643 08/12/17 2105 08/13/17 0648  GLUCAP 184* 103* 118*    Fair control on 08/13/2017 10. Hypertension. Norvasc 10 mg daily.   Hydralazine 10 TID started on 1/9, increased to 25 on 1/10, increased to 50 on 1/16, increased to 75 on 1/18   Lisinopril 2.5 added on 1/12, increased to 10 on 1/13 11. Hyperlipidemia. Zocor 12.  Ileus: Resolved, now with loose stools   Multifactorial, including cessation of chronic opiods   Pt also recently started on metformin will hold for now   KUB reviewed, improving   C. difficile neg   Fiber added 1/13, increased on 1/15   Compounded by magnesium supplementation   Stools less frequent and more formed 13. Hyponatremia: Resolved   Cont to monitor 14. Leukocytosis: Resolved   Afebrile      Cont to monitor 15.  RUE swelling negative duplex doppler 1/5 16.  Confusion   Likely Wernicke's encephalopathy   D/ced soma and changed oxyIR to Q6   Gabapentin reduced to 600mg  TID, d/ced on  1/9   MS contin changed to tramadol   LFTs elevated, gradually improving.    Ammonia WNL on 1/12, lactulose d/ced   UA unremarkable, U culture NG   Seroquel weaned on 1/15, DC'd on 1/17   CXR reviewed, showing likely atelectasis   EEG with generalized slowing   MRI consistent with Wernicke's encephalopathy    Appreciate neurology recs,signed off   Discussed with Hospitalist, notes reviewed, appreciate recs, signed off   Thiamine daily 17. Hypokalemia/Hypomagnesemia   K 3.5 on 1/18   Mag 1.7 on 1/18   IV mag daily, will transition to by mouth at discharge   K+ 20 meq daily, increased to 20 twice a day on 1/18   Labs ordered for Monday 18. Thrombocytopenia   Platelets 135 on 1/18   Labs ordered for Moday  LOS (Days) 19 A FACE TO FACE EVALUATION WAS PERFORMED  Marybelle Giraldo T 08/13/2017 8:19 AM

## 2017-08-13 NOTE — Progress Notes (Signed)
Physical Therapy Session Note  Patient Details  Name: Justin Stone MRN: [id number] Date of Birth: [birth year]  Today's Date: 08/13/2017 PT Individual Time: 0800-0900 PT Individual Time Calculation (min): 60 min   Short Term Goals: Week 1:  PT Short Term Goal 1 (Week 1): Pt will don and doff RLE BKA prosthesis w/ set-up assist while sitting at EOB or in w/c PT Short Term Goal 1 - Progress (Week 1): Discontinued (comment)(NWB R BKA prosthesis at this time 2/2 wound) PT Short Term Goal 2 (Week 1): Pt will self-propel w/c 150' w/ supervision PT Short Term Goal 2 - Progress (Week 1): Progressing toward goal PT Short Term Goal 3 (Week 1): Pt will transfer bed<>chair via slide board transfer w/ supervision  PT Short Term Goal 3 - Progress (Week 1): Not progressing PT Short Term Goal 4 (Week 1): Pt will perform sit<>stand transfer w/ Max assist using LRAD PT Short Term Goal 4 - Progress (Week 1): Discontinued (comment)(Pt NWB bilaterally at this time) PT Short Term Goal 5 (Week 1): Pt will maintain dynamic sitting balance during functional activity w/ supervision w/o UE support PT Short Term Goal 5 - Progress (Week 1): Not progressing  Skilled Therapeutic Interventions/Progress Updates:  Pt was seen bedside in the am. Pt oriented to self, requiring frequent redirection throughout treatment to remain focused on task at hand. Pt able to pull pants with min A and verbal cues. Pt transferred supine to edge of bed with min to mod A and verbal cues with side rail. Pt tolerated edge of bed with c/s to S and B UE support. Pt transferred edge of bed to w/c with sliding board and min to mod A with verbal cues for safety and technique. Pt left sitting up in tilt in space w/c with quick release belt in place.   Therapy Documentation Precautions:  Precautions Precautions: Fall Required Braces or Orthoses: Other Brace/Splint Other Brace/Splint: Has LLE splint for positioning of  amputation Restrictions Weight Bearing Restrictions: Yes LLE Weight Bearing: Non weight bearing Other Position/Activity Restrictions: has R prosthtic leg for R BKA (OLD); L BKA (NEW) General:   Pain: No c/o pain.   See Function Navigator for Current Functional Status.   Therapy/Group: Individual Therapy  Rayford HalstedMitchell, Thomas Mabry G 08/13/2017, 12:07 PM

## 2017-08-14 ENCOUNTER — Inpatient Hospital Stay (HOSPITAL_COMMUNITY): Payer: Medicare Other

## 2017-08-14 LAB — GLUCOSE, CAPILLARY
GLUCOSE-CAPILLARY: 120 mg/dL — AB (ref 65–99)
GLUCOSE-CAPILLARY: 111 mg/dL — AB (ref 65–99)
GLUCOSE-CAPILLARY: 100 mg/dL — AB (ref 65–99)

## 2017-08-14 NOTE — Progress Notes (Signed)
Occupational Therapy Session Note  Patient Details  Name: EMBER GOTTWALD MRN: [id number] Date of Birth: [birth year]  Today's Date: 08/14/2017 OT Individual Time: 3149-7026 OT Individual Time Calculation (min): 54 min    Short Term Goals: Week 3:  OT Short Term Goal 1 (Week 3): Continue working on established LTGs set a min assist level overall.  Skilled Therapeutic Interventions/Progress Updates:    1:1. Pt requires constant reorientation this session while completing bathing and dressing tasks. Pt only oriented to self and perseverates on being at home/asking where wife is, if the closet was the door to the rest of the house and he needed to get ready for church. Pt requires MAX cueing for initation, sequencing and organizing of bathing tasks. Pt able to wash UB with A to wash back with supervision. OT washes buttocks after finding incontinent BM in brief with pt rolling B for hygiene. Pt threads BLE into pants with A to orient and VC for threading LLE into correct pant leg. Pt supine<>EOB with supervision and dons t shirt with set up. Exited session with pt seated in w/c call light in reach, bed exit alarm on and all needs met.  Therapy Documentation Precautions:  Precautions Precautions: Fall Required Braces or Orthoses: Other Brace/Splint Other Brace/Splint: Has LLE splint for positioning of amputation Restrictions Weight Bearing Restrictions: Yes LLE Weight Bearing: Non weight bearing Other Position/Activity Restrictions: has R prosthtic leg for R BKA (OLD); L BKA (NEW)  See Function Navigator for Current Functional Status.   Therapy/Group: Individual Therapy  Tonny Branch 08/14/2017, 7:20 AM

## 2017-08-14 NOTE — Progress Notes (Signed)
[facility] PHYSICAL MEDICINE & REHABILITATION     PROGRESS NOTE  Subjective/Complaints:  No new issues.  Patient still with poor safety awareness  ROS: pt denies nausea, vomiting, diarrhea, cough, shortness of breath or chest pain   Objective: Vital Signs: Blood pressure (!) 146/79, pulse (!) 107, temperature 98.9 F (37.2 C), temperature source Oral, resp. rate 20, height 5\' 7"  (1.702 m), weight 95.3 kg (210 lb 1.6 oz), SpO2 99 %. No results found. Recent Labs    08/12/17 0443  WBC 6.3  HGB 8.9*  HCT 28.5*  PLT 135*   Recent Labs    08/12/17 0443  NA 139  K 3.5  CL 114*  GLUCOSE 118*  BUN <5*  CREATININE 0.86  CALCIUM 8.8*   CBG (last 3)  Recent Labs    08/13/17 1623 08/13/17 2100 08/14/17 0639  GLUCAP 111* 125* 120*    Wt Readings from Last 3 Encounters:  08/10/17 95.3 kg (210 lb 1.6 oz)  07/21/17 113.4 kg (250 lb)  07/21/17 113.8 kg (250 lb 12.8 oz)    Physical Exam:  BP (!) 146/79 (BP Location: Left Arm)   Pulse (!) 107   Temp 98.9 F (37.2 C) (Oral)   Resp 20   Ht 5\' 7"  (1.702 m)   Wt 95.3 kg (210 lb 1.6 oz)   SpO2 99%   BMI 32.91 kg/m  General: NAD. Vitals stable. HENT: Normocephalic. Atraumatic Eyes: EOMI. No discharge Cardiac. RRR without murmur. No JVD  . Respiratory. Clear to auscultation. unlabored.  Abdomen: good bowel sounds. nondistended Musculoskeletal: Bilateral BKA Neurological. Alert Motor: B/l UE 5/5 proximal to distal LLE: HF, KE 4+/5  RLE: HF, KE 4+/5 Skin. Left BKA with dressing C/D/I  Right BKA with dressing c/d/i Psych: Confused and confabulates  Assessment/Plan: 1. Functional deficits secondary to bilateral BKA which require 3+ hours per day of interdisciplinary therapy in a comprehensive inpatient rehab setting. Physiatrist is providing close team supervision and 24 hour management of active medical problems listed below. Physiatrist and rehab team continue to assess barriers to discharge/monitor patient progress  toward functional and medical goals.  Function:  Bathing Bathing position   Position: Sitting EOB  Bathing parts Body parts bathed by patient: Right arm, Chest, Abdomen, Right upper leg, Left upper leg, Left arm, Front perineal area Body parts bathed by helper: Buttocks  Bathing assist Assist Level: Set up      Upper Body Dressing/Undressing Upper body dressing   What is the patient wearing?: Hospital gown     Pull over shirt/dress - Perfomed by patient: Thread/unthread right sleeve, Thread/unthread left sleeve, Put head through opening, Pull shirt over trunk Pull over shirt/dress - Perfomed by helper: Pull shirt over trunk        Upper body assist Assist Level: Set up   Set up : To obtain clothing/put away  Lower Body Dressing/Undressing Lower body dressing   What is the patient wearing?: Pants     Pants- Performed by patient: Thread/unthread right pants leg Pants- Performed by helper: Thread/unthread left pants leg, Pull pants up/down, Thread/unthread right pants leg                      Lower body assist Assist for lower body dressing: Touching or steadying assistance (Pt > 75%)      Toileting Toileting Toileting activity did not occur: No continent bowel/bladder event Toileting steps completed by patient: Performs perineal hygiene Toileting steps completed by helper: Adjust clothing prior to  toileting, Performs perineal hygiene, Adjust clothing after toileting Toileting Assistive Devices: Grab bar or rail, Prosthesis/orthosis  Toileting assist Assist level: Two helpers   Transfers Chair/bed transfer   Chair/bed transfer method: Lateral scoot Chair/bed transfer assist level: Touching or steadying assistance (Pt > 75%) Chair/bed transfer assistive device: Sliding board Mechanical lift: Maximove   Locomotion Ambulation Ambulation activity did not occur: Safety/medical concerns         Wheelchair   Type: Manual Max wheelchair distance: 100' Assist  Level: Dependent (Pt equals 0%)(TIS)  Cognition Comprehension Comprehension assist level: Understands basic 75 - 89% of the time/ requires cueing 10 - 24% of the time  Expression Expression assist level: Expresses basic 50 - 74% of the time/requires cueing 25 - 49% of the time. Needs to repeat parts of sentences.  Social Interaction Social Interaction assist level: Interacts appropriately 25 - 49% of time - Needs frequent redirection.  Problem Solving Problem solving assist level: Solves basic 25 - 49% of the time - needs direction more than half the time to initiate, plan or complete simple activities  Memory Memory assist level: Recognizes or recalls 25 - 49% of the time/requires cueing 50 - 75% of the time    Medical Problem List and Plan: 1.  Decreased functional mobility secondary to left BKA 07/21/2017 as well as history of right BKA. Patient does use a right prosthesis   Cont CIR  2.  DVT Prophylaxis/Anticoagulation: Subcutaneous Lovenox. Monitor for any bleeding episodes 3. Pain Management/chronic pain: Soma 350 mg 3 times a day, Neurontin 1200 mg 3 times a day, MS Contin 15 mg every 12 hours, Robaxin and oxycodone as needed 4. Mood: Effexor 75 mg twice a day Trazodone 25 mg daily at bedtime as needed 5. Neuropsych: This patient is capable of making decisions on his own behalf. 6. Skin/Wound Care: Routine skin checks 7. Fluids/Electrolytes/Nutrition: Routine I&O's   Cr. 0.86 on 1/18 8. Acute blood loss anemia. Continue iron supplement.    Hb 8.9 on 1/18   Hemoccult stools positive.  No gross hematuria at present   Labs ordered for Monday   Cont to monitor 9. Diabetes mellitus and peripheral neuropathy. Hemoglobin A1c 6.5. Currently with SSI. Check blood sugars before meals and at bedtime. Diabetic teaching. Patient on Glucophage 500 mg twice a day prior to admission. Resume as needed   Metformin 500 daily started on 1/2, d/ced due to watery stool   Monitor with increased  mobility CBG (last 3)  Recent Labs    08/13/17 1623 08/13/17 2100 08/14/17 0639  GLUCAP 111* 125* 120*    Fair control on 08/14/2017 10. Hypertension. Norvasc 10 mg daily.   Hydralazine 10 TID started on 1/9, increased to 25 on 1/10, increased to 50 on 1/16, increased to 75 on 1/18   Lisinopril 2.5 added on 1/12, increased to 10 on 1/13 Good control -as of 08/14/2017 11. Hyperlipidemia. Zocor 12.  Ileus: Resolved, now with loose stools   Multifactorial, including cessation of chronic opiods   Pt also recently started on metformin will hold for now   KUB reviewed, improving   C. difficile neg   Fiber added 1/13, increased on 1/15   Compounded by magnesium supplementation   Stools less frequent and more formed 13. Hyponatremia: Resolved   Cont to monitor 14. Leukocytosis: Resolved   Afebrile      Cont to monitor 15.  RUE swelling negative duplex doppler 1/5 16.  Confusion   Likely Wernicke's encephalopathy   D/ced soma  and changed oxyIR to Q6   Gabapentin reduced to 600mg  TID, d/ced on 1/9   MS contin changed to tramadol   LFTs elevated, gradually improving.    Ammonia WNL on 1/12, lactulose d/ced   UA unremarkable, U culture NG   Seroquel weaned on 1/15, DC'd on 1/17   CXR reviewed, showing likely atelectasis   EEG with generalized slowing   MRI consistent with Wernicke's encephalopathy    Appreciate neurology recs,signed off   Discussed with Hospitalist, notes reviewed, appreciate recs, signed off   Thiamine daily 17. Hypokalemia/Hypomagnesemia   K 3.5 on 1/18   Mag 1.7 on 1/18   IV mag daily, will transition to by mouth at discharge   K+ 20 meq daily, increased to 20 twice a day on 1/18   Labs ordered for Monday 18. Thrombocytopenia   Platelets 135 on 1/18   Labs ordered for Moday  LOS (Days) 20 A FACE TO FACE EVALUATION WAS PERFORMED  SWARTZ,ZACHARY T 08/14/2017 8:02 AM

## 2017-08-15 ENCOUNTER — Inpatient Hospital Stay (HOSPITAL_COMMUNITY): Payer: Medicare Other | Admitting: Speech Pathology

## 2017-08-15 ENCOUNTER — Inpatient Hospital Stay (HOSPITAL_COMMUNITY): Payer: Medicare Other

## 2017-08-15 ENCOUNTER — Inpatient Hospital Stay (HOSPITAL_COMMUNITY): Payer: Medicare Other | Admitting: Occupational Therapy

## 2017-08-15 ENCOUNTER — Encounter (HOSPITAL_COMMUNITY): Payer: Medicare Other | Admitting: Psychology

## 2017-08-15 DIAGNOSIS — F04 Amnestic disorder due to known physiological condition: Secondary | ICD-10-CM

## 2017-08-15 LAB — CBC WITH DIFFERENTIAL/PLATELET
Basophils Absolute: 0.1 10*3/uL (ref 0.0–0.1)
Basophils Relative: 1 %
Eosinophils Absolute: 0.4 10*3/uL (ref 0.0–0.7)
Eosinophils Relative: 5 %
HEMATOCRIT: 31.9 % — AB (ref 39.0–52.0)
HEMOGLOBIN: 10.4 g/dL — AB (ref 13.0–17.0)
LYMPHS ABS: 1.3 10*3/uL (ref 0.7–4.0)
Lymphocytes Relative: 16 %
MCH: 32.2 pg (ref 26.0–34.0)
MCHC: 32.6 g/dL (ref 30.0–36.0)
MCV: 98.8 fL (ref 78.0–100.0)
MONO ABS: 1 10*3/uL (ref 0.1–1.0)
MONOS PCT: 12 %
NEUTROS ABS: 5.4 10*3/uL (ref 1.7–7.7)
NEUTROS PCT: 66 %
Platelets: 217 10*3/uL (ref 150–400)
RBC: 3.23 MIL/uL — ABNORMAL LOW (ref 4.22–5.81)
RDW: 14.9 % (ref 11.5–15.5)
WBC: 8.1 10*3/uL (ref 4.0–10.5)

## 2017-08-15 LAB — BASIC METABOLIC PANEL
ANION GAP: 9 (ref 5–15)
BUN: <5 mg/dL — ABNORMAL LOW (ref 6–20)
CHLORIDE: 112 mmol/L — AB (ref 101–111)
CO2: 18 mmol/L — AB (ref 22–32)
Calcium: 9.3 mg/dL (ref 8.9–10.3)
Creatinine, Ser: 0.82 mg/dL (ref 0.61–1.24)
GFR calc non Af Amer: >60 mL/min (ref >60)
GLUCOSE: 121 mg/dL — AB (ref 65–99)
POTASSIUM: 4.2 mmol/L (ref 3.5–5.1)
Sodium: 139 mmol/L (ref 135–145)

## 2017-08-15 LAB — GLUCOSE, CAPILLARY
GLUCOSE-CAPILLARY: 112 mg/dL — AB (ref 65–99)
Glucose-Capillary: 117 mg/dL — ABNORMAL HIGH (ref 65–99)
Glucose-Capillary: 124 mg/dL — ABNORMAL HIGH (ref 65–99)
Glucose-Capillary: 149 mg/dL — ABNORMAL HIGH (ref 65–99)

## 2017-08-15 NOTE — Progress Notes (Signed)
[facility]ILIT[facility]     PROGRESS NOTE  Subjective/Complaints:  Pt seen lying in bed this AM.  No reported issues overnight.  Remains confused.   ROS: Unable to assess due to cognition.   Objective: Vital Signs: Blood pressure (!) 150/81, pulse (!) 103, temperature 98 F (36.7 C), temperature source Oral, resp. rate 18, height 5\' 7"  (1.702 m), weight 95.3 kg (210 lb 1.6 oz), SpO2 100 %. No results found. No results for input(s): WBC, HGB, HCT, PLT in the last 72 hours. No results for input(s): NA, K, CL, GLUCOSE, BUN, CREATININE, CALCIUM in the last 72 hours.  Invalid input(s): CO CBG (last 3)  Recent Labs    08/14/17 1148 08/14/17 1636 08/15/17 0632  GLUCAP 111* 100* 117*    Wt Readings from Last 3 Encounters:  08/10/17 95.3 kg (210 lb 1.6 oz)  07/21/17 113.4 kg (250 lb)  07/21/17 113.8 kg (250 lb 12.8 oz)    Physical Exam:  BP (!) 150/81 (BP Location: Left Arm)   Pulse (!) 103   Temp 98 F (36.7 C) (Oral)   Resp 18   Ht 5\' 7"  (1.702 m)   Wt 95.3 kg (210 lb 1.6 oz)   SpO2 100%   BMI 32.91 kg/m  General: NAD. Vitals stable. HENT: Normocephalic. Atraumatic Eyes: EOMI. No discharge Cardiac. RRR without murmur. No JVD. Respiratory. Clear to auscultation. Unlabored.  Abdomen: good bowel sounds. nondistended Musculoskeletal: Bilateral BKA Neurological. Alert Motor: B/l UE 5/5 proximal to distal LLE: HF, KE 4+/5  RLE: HF, KE 4+/5 Skin. Left BKA with dressing C/D/I  Right BKA with dressing c/d/i Psych: Confabulation  Assessment/Plan: 1. Functional deficits secondary to bilateral BKA which require 3+ hours per day of interdisciplinary therapy in a comprehensive inpatient rehab setting. Physiatrist is providing close team supervision and 24 hour management of active medical problems listed below. Physiatrist and rehab team continue to assess barriers to discharge/monitor patient progress toward functional and medical  goals.  Function:  Bathing Bathing position   Position: Bed  Bathing parts Body parts bathed by patient: Right arm, Chest, Abdomen, Right upper leg, Left upper leg, Left arm, Front perineal area Body parts bathed by helper: Buttocks  Bathing assist Assist Level: Set up      Upper Body Dressing/Undressing Upper body dressing   What is the patient wearing?: Pull over shirt/dress     Pull over shirt/dress - Perfomed by patient: Thread/unthread right sleeve, Thread/unthread left sleeve, Put head through opening, Pull shirt over trunk Pull over shirt/dress - Perfomed by helper: Pull shirt over trunk        Upper body assist Assist Level: Set up   Set up : To obtain clothing/put away  Lower Body Dressing/Undressing Lower body dressing   What is the patient wearing?: Underwear Underwear - Performed by patient: Thread/unthread right underwear leg, Thread/unthread left underwear leg Underwear - Performed by helper: Pull underwear up/down Pants- Performed by patient: Thread/unthread right pants leg Pants- Performed by helper: Thread/unthread left pants leg, Pull pants up/down, Thread/unthread right pants leg                      Lower body assist Assist for lower body dressing: Touching or steadying assistance (Pt > 75%)      Toileting Toileting Toileting activity did not occur: No continent bowel/bladder event Toileting steps completed by patient: Performs perineal hygiene Toileting steps completed by helper: Adjust clothing prior to toileting, Performs perineal hygiene, Adjust  clothing after toileting Toileting Assistive Devices: Grab bar or rail, Prosthesis/orthosis  Toileting assist Assist level: Two helpers   Transfers Chair/bed transfer   Chair/bed transfer method: Lateral scoot Chair/bed transfer assist level: Touching or steadying assistance (Pt > 75%) Chair/bed transfer assistive device: Sliding board Mechanical lift: Maximove   Locomotion Ambulation  Ambulation activity did not occur: Safety/medical concerns         Wheelchair   Type: Manual Max wheelchair distance: 100' Assist Level: Dependent (Pt equals 0%)(TIS)  Cognition Comprehension Comprehension assist level: Understands basic 75 - 89% of the time/ requires cueing 10 - 24% of the time  Expression Expression assist level: Expresses basic 50 - 74% of the time/requires cueing 25 - 49% of the time. Needs to repeat parts of sentences.  Social Interaction Social Interaction assist level: Interacts appropriately 25 - 49% of time - Needs frequent redirection.  Problem Solving Problem solving assist level: Solves basic 25 - 49% of the time - needs direction more than half the time to initiate, plan or complete simple activities  Memory Memory assist level: Recognizes or recalls 25 - 49% of the time/requires cueing 50 - 75% of the time    Medical Problem List and Plan: 1.  Decreased functional mobility secondary to left BKA 07/21/2017 as well as history of right BKA. Patient does use a right prosthesis   Cont CIR  2.  DVT Prophylaxis/Anticoagulation: Subcutaneous Lovenox. Monitor for any bleeding episodes 3. Pain Management/chronic pain: Soma 350 mg 3 times a day, Neurontin 1200 mg 3 times a day, MS Contin 15 mg every 12 hours, Robaxin and oxycodone as needed 4. Mood: Effexor 75 mg twice a day Trazodone 25 mg daily at bedtime as needed 5. Neuropsych: This patient is not capable of making decisions on his own behalf. 6. Skin/Wound Care: Routine skin checks 7. Fluids/Electrolytes/Nutrition: Routine I&O's   Cr. 0.86 on 1/18 8. Acute blood loss anemia. Continue iron supplement.    Hb 8.9 on 1/18   Hemoccult stools positive.  No gross hematuria at present   Labs pending   Cont to monitor 9. Diabetes mellitus and peripheral neuropathy. Hemoglobin A1c 6.5. Currently with SSI. Check blood sugars before meals and at bedtime. Diabetic teaching. Patient on Glucophage 500 mg twice a day prior to  admission. Resume as needed   Metformin 500 daily started on 1/2, d/ced due to watery stool   Monitor with increased mobility CBG (last 3)  Recent Labs    08/14/17 1148 08/14/17 1636 08/15/17 0632  GLUCAP 111* 100* 117*    Relatively controlled on 1/21 10. Hypertension. Norvasc 10 mg daily.   Hydralazine 10 TID started on 1/9, increased to 25 on 1/10, increased to 50 on 1/16, increased to 75 on 1/18   Lisinopril 2.5 added on 1/12, increased to 10 on 1/13   Slightly elevated on 1/21   Cont to monitor 11. Hyperlipidemia. Zocor 12.  Ileus: Resolved, now with loose stools   Multifactorial, including cessation of chronic opiods   Pt also recently started on metformin will hold for now   KUB reviewed, improving   C. difficile neg   Fiber added 1/13, increased on 1/15   Confounded by magnesium supplementation   Improving 13. Hyponatremia: Resolved   Cont to monitor 14. Leukocytosis: Resolved   Afebrile      Cont to monitor 15.  RUE swelling negative duplex doppler 1/5 16.  Confusion   Likely Wernicke's encephalopathy   D/ced soma and changed oxyIR to Q6  Gabapentin reduced to 600mg  TID, d/ced on 1/9   MS contin changed to tramadol   LFTs elevated, gradually improving.    Ammonia WNL on 1/12, lactulose d/ced   UA unremarkable, U culture NG   Seroquel weaned on 1/15, DC'd on 1/17   CXR reviewed, showing likely atelectasis   EEG with generalized slowing   MRI consistent with Wernicke's encephalopathy    Appreciate neurology recs,signed off   Discussed with Hospitalist, notes reviewed, appreciate recs, signed off   Thiamine daily 17. Hypokalemia/Hypomagnesemia   K 3.5 on 1/18   Mag 1.7 on 1/18   IV mag daily, will transition to by mouth at discharge   K+ 20 meq daily, increased to 20 twice a day on 1/18   Labs pending 18. Thrombocytopenia   Platelets 135 on 1/18   Labs pending  LOS (Days) 21 A FACE TO FACE EVALUATION WAS PERFORMED  Roselle Norton Karis Juba 08/15/2017 8:25 AM

## 2017-08-15 NOTE — Progress Notes (Signed)
Occupational Therapy Session Note  Patient Details  Name: Justin Stone MRN: [id number] Date of Birth: May 31, 1961  Today's Date: 08/15/2017 OT Individual Time: 0800-0900 OT Individual Time Calculation (min): 60 min    Short Term Goals: Week 3:  OT Short Term Goal 1 (Week 3): Continue working on established LTGs set a min assist level overall.  Skilled Therapeutic Interventions/Progress Updates:    Pt completed bathing and dressing supine to sit EOB during session.  He was oriented to place and day of the week but not situation.  Max instructional cueing to sequence through bathing.  He was able to roll side to side with supervision using rails and perform peri washing with setup of washcloth in sidelying.  He sat on EOB with supervision to complete all other bathing and UB dressing.  Once he donned the paper scrub pants, he transitioned to supine rolling with mod assist to pull them over his hips.  Min assist for sliding board transfer to wheelchair.  Finished session with donning of the left leg support and pt completing oral hygiene and brushing hair with setup from wheelchair level.  Pt left in chair with call button and phone in reach and safety belts in place.    Therapy Documentation Precautions:  Precautions Precautions: Fall Required Braces or Orthoses: Other Brace/Splint Other Brace/Splint: Has LLE splint for positioning of amputation Restrictions Weight Bearing Restrictions: Yes LLE Weight Bearing: Non weight bearing Other Position/Activity Restrictions: has R prosthtic leg for R BKA (OLD); L BKA (NEW)  Pain: Pain Assessment Pain Assessment: 0-10 Faces Pain Scale: No hurt Pain Type: Acute pain Pain Location: Arm Pain Orientation: Right;Left Pain Intervention(s): Medication (See eMAR) ADL: See Function Navigator for Current Functional Status.   Therapy/Group: Individual Therapy  Ghazal Pevey OTR/L 08/15/2017, 12:07 PM

## 2017-08-15 NOTE — Progress Notes (Signed)
Physical Therapy Session Note  Patient Details  Name: Justin Stone MRN: [id number] Date of Birth: 09-04-1960  Today's Date: 08/15/2017 PT Individual Time: 1300-1358 PT Individual Time Calculation (min): 58 min   Short Term Goals: Week 3:  PT Short Term Goal 1 (Week 3): LTG=STG due to estimated length of stay  Skilled Therapeutic Interventions/Progress Updates:    Pt seated in TIS w/c upon PT arrival, agreeable to therapy tx and denies pain. Pt oriented x 2 (place, name) this session, able to state the correct month but unable to determine day/year. Pt transported from room>gym total assist in w/c. Pt performed slideboard transfer from w/c<>mat using slideboard and min assist, verbal and tactile cues for techniques. Pt worked on dynamic seated balance edge of mat without UE support in order to reach for/toss bean bags and to participate in ball toss, supervision for seated balance. Pt seated edge of mat performed LE therex, 2 x 10 each bilateraly: LAQ, hip abduction, ball squeezes.  Pt transferred to supine with supervision and performed 2 x 10 hip flexion. Pt transferred into sidelying on each side with supervision and performed 2 x 10 abduction bilaterally for LE strengthening. Pt transferred to prone>prone on elbows for hip flexor stretch. In prone pt performed x 10 hip extension bilaterally for strengthening. Pt transferred to sitting min assist and mat>w/c min assist with slideboard. Pt transported back to room and left seated with needs in reach, QRB in place.   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   Therapy/Group:  Individual Therapy  Cresenciano GenreEmily van Schagen, PT, DPT 08/15/2017, 7:57 AM

## 2017-08-15 NOTE — Consult Note (Signed)
Neuropsychological Consultation   Patient:   Justin Stone   DOB:   Jul 17, 1961  MR Number:  [id number]  Location:  [facility] [address] [facility] [id number] [place] [address] Dept: [phone] Loc: [phone]           Date of Service:   08/15/2017  Start Time:   10 AM End Time:   11 AM  Provider/Observer:  Arley Phenix, Psy.D.       Clinical Neuropsychologist       Billing Code/Service: [phone] Units  Chief Complaint:    [name] is a 57 year old male with history of diabetes mellitus, chronic pain syndrome, hypertension, right BKA several years ago.  Presented on 07/21/2017 with ulcer on left foot.  Underwent left BKA on  07/21/2017.  Initial intermittent bouts of confusion and also acute blood loss anemia.  Cognition and development of symptoms of Korsakoff's psychosis developed over time.    Reason for Service:  HPI: 57 year old right-handed male with history of diabetes mellitus, chronic pain syndrome, hypertension, right BKA several years ago. History taken from chart review and patient. Patient lives alone. Independent with a cane prior to admission as well as right prosthesis. Presented 07/21/2017 with ulceration to left foot and no change with conservative care. X-rays of left foot consistent with cellulitis and osteomyelitis. Underwent left BKA 07/21/2017 per Dr. Victorino Dike. Hospital course pain management. Intermittent bouts of confusion with narcotics adjusted. Acute blood loss anemia 10.2 and monitored. Leukocytosis 16,000. Subcutaneous Lovenox for DVT prophylaxis. Physical and occupational therapy evaluations completed with recommendations of physical medicine rehabilitation consult. Patient was admitted for comprehensive rehabilitation program.  Patient has developed what would appear to be severe Korsakoff's Psychosis.  Delusion like functioning due to near complete anterograde and  retrograde memory loss for events etc with confabulatory efforts to cover for loss of memory.  This does not appear to be purposeful, but more of a psychotic/automatic response.   Current Status:  The patient continues with psychotic symptoms but has been pleasant in his interactions with others.  Severe amnestic symptoms but active psychotic confabulation present.  Behavioral Observation: [name]  presents as a 57 y.o.-year-old Right African American Male who appeared his stated age. his dress was Appropriate and he was Well Groomed and his manners were Appropriate to the situation.  his participation was indicative of Redirectable behaviors.  There were  physical disabilities noted.  he displayed an appropriate level of cooperation and motivation.     Interactions:    Active Monopolizing and Redirectable  Attention:   abnormal and attention span appeared shorter than expected for age  Memory:   abnormal; global memory impairment noted  Visuo-spatial:  not examined  Speech (Volume):  normal  Speech:   normal; with good word knowledge/vocabulary  Thought Process:  Circumstantial, Tangential and Disorganized  Though Content:  Delusions; psychotic confabulations  Orientation:   Patient is only clearly orientated to person.  He still has some confusion about place but was able to say he was at [facility]  Year was 2015 but did get month.  Judgment:   Poor  Planning:   Poor  Affect:    Excited  Mood:    Euthymic  Insight:   Lacking  Intelligence:   normal  Medical History:   Past Medical History:  Diagnosis Date  . Allergy    eye allergies  . Anxiety   . Arthritis   .  Diabetes mellitus without complication (HCC)    takes oral meds now only  . GERD (gastroesophageal reflux disease)    past hx > 2 yrs ago  . Hepatitis    "Hepatitis C"- tx. with Harvoni- now testing negative.  . Hyperlipidemia   . Hypertension   . Neuromuscular disorder (HCC)    neuropathy hands/  feet.  . Osteopenia   . Prosthesis adjustment    right leg- below knee(weighs 10 lbs.)       Psychiatric History:  These psychotic symptoms appear to be new per indirect reports of family.  No prior history of schizophrenia.  Family Med/Psych History:  Family History  Problem Relation Age of Onset  . Diabetes Maternal Uncle   . Diabetes Maternal Grandmother   . Colon cancer Neg Hx   . Colon polyps Neg Hx   . Rectal cancer Neg Hx   . Stomach cancer Neg Hx   . Esophageal cancer Neg Hx   . Sickle cell anemia Neg Hx     Risk of Suicide/Violence: virtually non-existent Patient denies SI and is in good mood state, although there was initially agitation.  Impression/DX:  Rolla PlateSheldon C Balin is a 57 year old male with history of diabetes mellitus, chronic pain syndrome, hypertension, right BKA several years ago.  Presented on 07/21/2017 with ulcer on left foot.  Underwent left BKA on  07/21/2017.  Initial intermittent bouts of confusion and also acute blood loss anemia.  Cognition and development of symptoms of Korsakoff's psychosis developed over time.    Patient has developed what would appear to be severe Korsakoff's Psychosis.  Delusion like functioning due to near complete anterograde and retrograde memory loss for events etc with confabulatory efforts to cover for loss of memory.  This does not appear to be purposeful, but more of a psychotic/automatic response.  The patient continues with psychotic symptoms but has been pleasant in his interactions with others.  Severe amnestic symptoms but active psychotic confabulation present.   Disposition/Plan:  Patient will no longer be able to take care of self and will need placement with 24 hour care.  Diagnosis:    Korsakoff's Psychosis Severe memory loss with extreme confabulation to compensate for total loss of memory        Electronically Signed   _______________________ Arley PhenixJohn Rodenbough, Psy.D.

## 2017-08-15 NOTE — Progress Notes (Signed)
Occupational Therapy Session Note  Patient Details  Name: Justin Stone MRN: [id number] Date of Birth: Mar 27, 1961  Today's Date: 08/15/2017 OT Individual Time: [id number] OT Individual Time Calculation (min): 47 min    Short Term Goals: Week 3:  OT Short Term Goal 1 (Week 3): Continue working on established LTGs set a min assist level overall.  Skilled Therapeutic Interventions/Progress Updates:    Pt confused, unable to state therapist's name during session even though he has stated it when therapist was passing by the room earlier.  Not oriented to place or situation this session, but easily re-directed during all tasks.  Took him down to the therapy gym where he completed transfers from wheelchair to mat and back with min assist using the sliding board.  Had him work on three dimensional puzzle using diagram as well while sitting on the mat.  He needed mod questioning cueing to complete moderately difficult puzzle, but he could complete minimally difficult puzzle with independence.  Pt upon returning to room still mistaking people in the hallway for people he knows and is familiar with, even though they are complete strangers.  Pt left tilted back in the wheelchair with call button and phone in reach and safety belts in place.    Therapy Documentation Precautions:  Precautions Precautions: Fall Required Braces or Orthoses: Other Brace/Splint Other Brace/Splint: Has LLE splint for positioning of amputation Restrictions Weight Bearing Restrictions: Yes LLE Weight Bearing: Non weight bearing Other Position/Activity Restrictions: has R prosthtic leg for R BKA (OLD); L BKA (NEW)  Pain: Pain Assessment Pain Assessment: No/denies pain ADL: See Function Navigator for Current Functional Status.   Therapy/Group: Individual Therapy  Deagan Sevin OTR/L 08/15/2017, 3:47 PM

## 2017-08-15 NOTE — [place] FL2 (Signed)
[place] MEDICAID FL2 LEVEL OF CARE SCREENING TOOL     IDENTIFICATION  Patient Name: Justin Stone Birthdate: 1960/09/18 Sex: male Admission Date (Current Location): 07/25/2017  [facility] and [place] Number:  Producer, television/film/video and Address:  [facility] [place], [address], [place], [place] [address]      Provider Number: [id number]  Attending Physician Name and Address:  Marcello Fennel, MD  Relative Name and Phone Number:  Shawnie Dapper Rico-brother [phone]    Current Level of Care: Other (Comment)(rehab) Recommended Level of Care: Skilled Nursing Facility Prior Approval Number:    Date Approved/Denied:   PASRR Number: [id number]  Discharge Plan: SNF    Current Diagnoses: Patient Active Problem List   Diagnosis Date Noted  . Korsakoff's psychosis or syndrome (nonalcoholic)   . Hypomagnesemia   . Wernicke encephalopathy   . Fall   . Abdominal distention   . Confusion   . Encephalopathy   . Essential hypertension   . Ileus, postoperative (HCC)   . Confusion, postoperative   . Hypokalemia   . Loose stools   . Post-operative pain   . Type 2 diabetes mellitus with peripheral neuropathy (HCC)   . Amputation of left lower extremity below knee (HCC) 07/25/2017  . S/P bilateral BKA (below knee amputation) (HCC)   . Unilateral complete BKA, left, sequela (HCC)   . Chronic pain syndrome   . Benign essential HTN   . Leukocytosis   . Acute blood loss anemia   . Tachycardia   . Hyponatremia   . AKI (acute kidney injury) (HCC) 07/22/2017  . Osteomyelitis of left foot (HCC)   . Diabetic wet gangrene of the foot (HCC) 07/21/2017  . Gangrene of left foot (HCC) 07/21/2017  . Unilateral complete BKA, left, initial encounter (HCC) 07/21/2017  . Insomnia 05/13/2016  . Umbilical hernia without obstruction and without gangrene 05/13/2016  . Spondylosis of lumbar region without myelopathy or radiculopathy 07/22/2015  . Obesity 06/29/2015   . 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 Type II diabetes mellitus, well controlled (HCC) 03/26/2015  . S/P BKA (below knee amputation), right (HCC) 03/26/2015  . Alcoholic cirrhosis of liver without ascites (HCC) 02/22/2015  . Chronic hepatitis C without hepatic coma (HCC) 02/22/2015  . Chronic low back pain 12/29/2014  . Essential hypertension, benign 12/29/2014  . Hyperlipidemia LDL goal <100 12/29/2014    Orientation RESPIRATION BLADDER Height & Weight     Self, Place  Normal Continent Weight: 210 lb 1.6 oz (95.3 kg) Height:  5\' 7"  (170.2 cm)  BEHAVIORAL SYMPTOMS/MOOD NEUROLOGICAL BOWEL NUTRITION STATUS      Incontinent Diet(regular thin liquids)  AMBULATORY STATUS COMMUNICATION OF NEEDS Skin   Extensive Assist Verbally Surgical wounds                       Personal Care Assistance Level of Assistance  Bathing, Dressing Bathing Assistance: Limited assistance Feeding assistance: Independent Dressing Assistance: Limited assistance     Functional Limitations Info  Speech Sight Info: Adequate Hearing Info: Adequate Speech Info: Impaired    SPECIAL CARE FACTORS FREQUENCY  PT (By licensed PT), OT (By licensed OT), Bowel and bladder program, Speech therapy     PT Frequency: 5x week OT Frequency: 5x week Bowel and Bladder Program Frequency: Timed toileting with nursing   Speech Therapy Frequency: 3 x week      Contractures Contractures Info: Not present    Additional Factors  Info  Code Status, Allergies, Insulin Sliding Scale Code Status Info: Full Code Allergies Info: Nk known Allergies   Insulin Sliding Scale Info: 3x daily with meals       Current Medications (08/15/2017):  This is the current hospital active medication list Current Facility-Administered Medications  Medication Dose Route Frequency Provider Last Rate Last Dose  . acetaminophen (TYLENOL) tablet 650 mg  650 mg Oral Q4H PRN Charlton Amorngiulli, Daniel J, PA-C   650 mg at  08/15/17 [id number]   Or  . acetaminophen (TYLENOL) suppository 650 mg  650 mg Rectal Q4H PRN Angiulli, Mcarthur Rossettianiel J, PA-C      . amLODipine (NORVASC) tablet 10 mg  10 mg Oral Daily Charlton Amorngiulli, Daniel J, PA-C   10 mg at 08/15/17 0850  . aspirin EC tablet 81 mg  81 mg Oral Daily Charlton Amorngiulli, Daniel J, PA-C   81 mg at 08/15/17 0850  . bisacodyl (DULCOLAX) suppository 10 mg  10 mg Rectal Daily PRN Charlton Amorngiulli, Daniel J, PA-C   10 mg at 08/01/17 1526  . docusate sodium (COLACE) capsule 100 mg  100 mg Oral BID Charlton Amorngiulli, Daniel J, PA-C   100 mg at 08/15/17 0850  . enoxaparin (LOVENOX) injection 40 mg  40 mg Subcutaneous Q24H AngiulliMcarthur Rossetti, Daniel J, PA-C   40 mg at 08/15/17 0848  . feeding supplement (ENSURE ENLIVE) (ENSURE ENLIVE) liquid 237 mL  237 mL Oral BID BM Marcello FennelPatel, Ankit Anil, MD   237 mL at 08/15/17 1255  . ferrous sulfate tablet 325 mg  325 mg Oral BID WC AngiulliMcarthur Rossetti, Daniel J, PA-C   325 mg at 08/15/17 0850  . Gerhardt's butt cream   Topical BID Angiulli, Mcarthur RossettiDaniel J, PA-C      . hydrALAZINE (APRESOLINE) tablet 75 mg  75 mg Oral Q8H Marcello FennelPatel, Ankit Anil, MD   75 mg at 08/15/17 1255  . insulin aspart (novoLOG) injection 0-15 Units  0-15 Units Subcutaneous TID WC AngiulliMcarthur Rossetti, Daniel J, PA-C   2 Units at 08/15/17 1316  . ketotifen (ZADITOR) 0.025 % ophthalmic solution 1 drop  1 drop Both Eyes BID Charlton Amorngiulli, Daniel J, PA-C   1 drop at 08/15/17 0848  . lisinopril (PRINIVIL,ZESTRIL) tablet 10 mg  10 mg Oral Daily Marcello FennelPatel, Ankit Anil, MD   10 mg at 08/15/17 0850  . LORazepam (ATIVAN) injection 0.5 mg  0.5 mg Intravenous Q4H PRN Charlton Amorngiulli, Daniel J, PA-C   0.5 mg at 08/05/17 1035   Or  . LORazepam (ATIVAN) tablet 0.5 mg  0.5 mg Oral Q4H PRN Charlton Amorngiulli, Daniel J, PA-C   0.5 mg at 08/13/17 2343  . magnesium gluconate (MAGONATE) tablet 500 mg  500 mg Oral Daily Marcello FennelPatel, Ankit Anil, MD   500 mg at 08/15/17 0850  . methocarbamol (ROBAXIN) tablet 500 mg  500 mg Oral Q6H PRN Charlton Amorngiulli, Daniel J, PA-C   500 mg at 08/13/17 0207  . nystatin  (MYCOSTATIN/NYSTOP) topical powder   Topical TID Angiulli, Mcarthur Rossettianiel J, PA-C      . ondansetron [facility](ZOFRAN) tablet 4 mg  4 mg Oral Q6H PRN Angiulli, Mcarthur Rossettianiel J, PA-C       Or  . ondansetron [facility](ZOFRAN) injection 4 mg  4 mg Intravenous Q6H PRN Angiulli, Mcarthur Rossettianiel J, PA-C      . polycarbophil (FIBERCON) tablet 1,250 mg  1,250 mg Oral Daily Marcello FennelPatel, Ankit Anil, MD   1,250 mg at 08/15/17 0849  . polyethylene glycol (MIRALAX / GLYCOLAX) packet 17 g  17 g Oral Daily PRN Angiulli, Mcarthur Rossettianiel J, PA-C  17 g at 07/27/17 1730  . potassium chloride SA (K-DUR,KLOR-CON) CR tablet 20 mEq  20 mEq Oral BID Marcello Fennel, MD   20 mEq at 08/15/17 0850  . QUEtiapine (SEROQUEL) tablet 25 mg  25 mg Oral BID PRN Jacquelynn Cree, PA-C   25 mg at 08/11/17 2246  . simvastatin (ZOCOR) tablet 10 mg  10 mg Oral QHS Charlton Amor, PA-C   10 mg at 08/14/17 2123  . sorbitol 70 % solution 30 mL  30 mL Oral Daily PRN Angiulli, Mcarthur Rossetti, PA-C      . thiamine (VITAMIN B-1) tablet 100 mg  100 mg Oral Daily AngiulliMcarthur Rossetti, PA-C   100 mg at 08/15/17 0850  . traMADol (ULTRAM) tablet 50 mg  50 mg Oral Q6H PRN Erick Colace, MD   50 mg at 08/13/17 2112  . traZODone (DESYREL) tablet 25 mg  25 mg Oral QHS PRN Charlton Amor, PA-C   25 mg at 08/14/17 2118  . venlafaxine [facility]) tablet 75 mg  75 mg Oral BID WC AngiulliMcarthur Rossetti, PA-C   75 mg at 08/15/17 1610     Discharge Medications: Please see discharge summary for a list of discharge medications.  Relevant Imaging Results:  Relevant Lab Results:   Additional Information SSN: [id number]     Rosalene Wardrop, Lemar Livings, LCSW

## 2017-08-15 NOTE — Progress Notes (Signed)
Speech Language Pathology Daily Session Note  Patient Details  Name: Justin Stone MRN: [id number] Date of Birth: [birth year]  Today's Date: 08/15/2017 SLP Individual Time: 1100-1200 SLP Individual Time Calculation (min): 60 min  Short Term Goals: Week 2: SLP Short Term Goal 1 (Week 2): STG=LTG due to ELOS  Skilled Therapeutic Interventions: Skilled treatment session focused on cognition goals. SLP facilitated session by providing Max A cues for orientation to place, situation and time. Further Max A given to maintain topic while looking at simple picture card. Pt difficult to redirect to topic/task at hand given confabulation over "[employer]." Pt was left upright in wheelchair with all needs within reach. Continue per current plan of care.      Function:    Cognition Comprehension Comprehension assist level: Understands basic 25 - 49% of the time/ requires cueing 50 - 75% of the time  Expression   Expression assist level: Expresses basic 25 - 49% of the time/requires cueing 50 - 75% of the time. Uses single words/gestures.  Social Interaction Social Interaction assist level: Interacts appropriately 25 - 49% of time - Needs frequent redirection.  Problem Solving Problem solving assist level: Solves basic less than 25% of the time - needs direction nearly all the time or does not effectively solve problems and may need a restraint for safety  Memory Memory assist level: Recognizes or recalls 25 - 49% of the time/requires cueing 50 - 75% of the time    Pain Pain Assessment Pain Assessment: 0-10 Faces Pain Scale: No hurt Pain Type: Acute pain Pain Location: Arm Pain Orientation: Right;Left Pain Intervention(s): Medication (See eMAR)  Therapy/Group: Individual Therapy  Thailyn Khalid 08/15/2017, 12:03 PM

## 2017-08-16 ENCOUNTER — Inpatient Hospital Stay (HOSPITAL_COMMUNITY): Payer: Medicare Other | Admitting: Occupational Therapy

## 2017-08-16 ENCOUNTER — Inpatient Hospital Stay (HOSPITAL_COMMUNITY): Payer: Medicare Other

## 2017-08-16 ENCOUNTER — Inpatient Hospital Stay (HOSPITAL_COMMUNITY): Payer: Medicare Other | Admitting: Speech Pathology

## 2017-08-16 DIAGNOSIS — F1096 Alcohol use, unspecified with alcohol-induced persisting amnestic disorder: Secondary | ICD-10-CM

## 2017-08-16 LAB — GLUCOSE, CAPILLARY
GLUCOSE-CAPILLARY: 105 mg/dL — AB (ref 65–99)
GLUCOSE-CAPILLARY: 111 mg/dL — AB (ref 65–99)
GLUCOSE-CAPILLARY: 103 mg/dL — AB (ref 65–99)
GLUCOSE-CAPILLARY: 124 mg/dL — AB (ref 65–99)

## 2017-08-16 MED ORDER — POTASSIUM CHLORIDE CRYS ER 20 MEQ PO TBCR
30.0000 meq | EXTENDED_RELEASE_TABLET | Freq: Every day | ORAL | Status: DC
  Administered 2017-08-17 – 2017-08-18 (×2): 30 meq via ORAL
  Filled 2017-08-16 (×2): qty 1

## 2017-08-16 NOTE — Progress Notes (Signed)
[place] PHYSICAL MEDICINE & REHABILITATION     PROGRESS NOTE  Subjective/Complaints:  Pt seen laying in bed this AM.  No reported issues overnight.    ROS: Unable to assess due to cognition.   Objective: Vital Signs: Blood pressure 134/77, pulse (!) 113, temperature 98.4 F (36.9 C), temperature source Oral, resp. rate 18, height 5\' 7"  (1.702 m), weight 95.3 kg (210 lb 1.6 oz), SpO2 94 %. No results found. Recent Labs    08/15/17 0729  WBC 8.1  HGB 10.4*  HCT 31.9*  PLT 217   Recent Labs    08/15/17 0729  NA 139  K 4.2  CL 112*  GLUCOSE 121*  BUN <5*  CREATININE 0.82  CALCIUM 9.3   CBG (last 3)  Recent Labs    08/15/17 1633 08/15/17 2101 08/16/17 0644  GLUCAP 149* 112* 105*    Wt Readings from Last 3 Encounters:  08/10/17 95.3 kg (210 lb 1.6 oz)  07/21/17 113.4 kg (250 lb)  07/21/17 113.8 kg (250 lb 12.8 oz)    Physical Exam:  BP 134/77   Pulse (!) 113   Temp 98.4 F (36.9 C) (Oral)   Resp 18   Ht 5\' 7"  (1.702 m)   Wt 95.3 kg (210 lb 1.6 oz)   SpO2 94%   BMI 32.91 kg/m  General: NAD. Vitals stable. HENT: Normocephalic. Atraumatic Eyes: EOMI. No discharge Cardiac. RRR. No JVD. Respiratory. Clear to auscultation. Unlabored.  Abdomen: good bowel sounds. nondistended Musculoskeletal: Bilateral BKA Neurological. Alert Motor: B/l UE 5/5 proximal to distal LLE: HF, KE 4+/5  RLE: HF, KE 4+/5 Skin. Left BKA with dressing C/D/I  Right BKA with dressing c/d/i Psych: Confabulation, persistent  Assessment/Plan: 1. Functional deficits secondary to bilateral BKA which require 3+ hours per day of interdisciplinary therapy in a comprehensive inpatient rehab setting. Physiatrist is providing close team supervision and 24 hour management of active medical problems listed below. Physiatrist and rehab team continue to assess barriers to discharge/monitor patient progress toward functional and medical goals.  Function:  Bathing Bathing position    Position: Bed  Bathing parts Body parts bathed by patient: Right arm, Chest, Abdomen, Right upper leg, Left upper leg, Left arm, Front perineal area, Buttocks Body parts bathed by helper: Back  Bathing assist Assist Level: Supervision or verbal cues      Upper Body Dressing/Undressing Upper body dressing   What is the patient wearing?: Pull over shirt/dress     Pull over shirt/dress - Perfomed by patient: Thread/unthread right sleeve, Thread/unthread left sleeve, Put head through opening, Pull shirt over trunk Pull over shirt/dress - Perfomed by helper: Pull shirt over trunk        Upper body assist Assist Level: Supervision or verbal cues   Set up : To obtain clothing/put away  Lower Body Dressing/Undressing Lower body dressing   What is the patient wearing?: Pants Underwear - Performed by patient: Thread/unthread right underwear leg, Thread/unthread left underwear leg Underwear - Performed by helper: Pull underwear up/down Pants- Performed by patient: Thread/unthread right pants leg Pants- Performed by helper: Thread/unthread left pants leg, Pull pants up/down, Thread/unthread right pants leg                      Lower body assist Assist for lower body dressing: Touching or steadying assistance (Pt > 75%)      Toileting Toileting Toileting activity did not occur: No continent bowel/bladder event Toileting steps completed by patient: Performs perineal hygiene  Toileting steps completed by helper: Adjust clothing prior to toileting, Performs perineal hygiene, Adjust clothing after toileting Toileting Assistive Devices: Grab bar or rail, Prosthesis/orthosis  Toileting assist Assist level: Two helpers   Transfers Chair/bed transfer   Chair/bed transfer method: Lateral scoot Chair/bed transfer assist level: Touching or steadying assistance (Pt > 75%) Chair/bed transfer assistive device: Sliding board Mechanical lift: Maximove   Locomotion Ambulation Ambulation  activity did not occur: Safety/medical concerns         Wheelchair   Type: Manual Max wheelchair distance: 100' Assist Level: Dependent (Pt equals 0%)(TIS)  Cognition Comprehension Comprehension assist level: Understands basic 25 - 49% of the time/ requires cueing 50 - 75% of the time  Expression Expression assist level: Expresses basic 25 - 49% of the time/requires cueing 50 - 75% of the time. Uses single words/gestures.  Social Interaction Social Interaction assist level: Interacts appropriately 25 - 49% of time - Needs frequent redirection.  Problem Solving Problem solving assist level: Solves basic less than 25% of the time - needs direction nearly all the time or does not effectively solve problems and may need a restraint for safety  Memory Memory assist level: Recognizes or recalls 25 - 49% of the time/requires cueing 50 - 75% of the time    Medical Problem List and Plan: 1.  Decreased functional mobility secondary to left BKA 07/21/2017 as well as history of right BKA. Patient does use a right prosthesis   Cont CIR  2.  DVT Prophylaxis/Anticoagulation: Subcutaneous Lovenox. Monitor for any bleeding episodes 3. Pain Management/chronic pain: Soma 350 mg 3 times a day, Neurontin 1200 mg 3 times a day, MS Contin 15 mg every 12 hours, Robaxin and oxycodone as needed 4. Mood: Effexor 75 mg twice a day Trazodone 25 mg daily at bedtime as needed 5. Neuropsych: This patient is not capable of making decisions on his own behalf. 6. Skin/Wound Care: Routine skin checks 7. Fluids/Electrolytes/Nutrition: Routine I&O's   Cr. 0.82 on 1/21 8. Acute blood loss anemia. Continue iron supplement.    Hb 10.4 on 1/21   Hemoccult stools positive.  No gross hematuria at present   Cont to monitor 9. Diabetes mellitus and peripheral neuropathy. Hemoglobin A1c 6.5. Currently with SSI. Check blood sugars before meals and at bedtime. Diabetic teaching. Patient on Glucophage 500 mg twice a day prior to  admission. Resume as needed   Metformin 500 daily started on 1/2, d/ced due to watery stool   Monitor with increased mobility CBG (last 3)  Recent Labs    08/15/17 1633 08/15/17 2101 08/16/17 0644  GLUCAP 149* 112* 105*    Relatively controlled on 1/22 10. Hypertension. Norvasc 10 mg daily.   Hydralazine 10 TID started on 1/9, increased to 25 on 1/10, increased to 50 on 1/16, increased to 75 on 1/18   Lisinopril 2.5 added on 1/12, increased to 10 on 1/13   Relatively controlled on 1/22   Cont to monitor 11. Hyperlipidemia. Zocor 12.  Ileus: Resolved, now with loose stools   Multifactorial, including cessation of chronic opiods   Pt also recently started on metformin will hold for now   KUB reviewed, improving   C. difficile neg   Fiber added 1/13, increased on 1/15   Confounded by magnesium supplementation   Improving 13. Hyponatremia: Resolved   Cont to monitor 14. Leukocytosis: Resolved   Afebrile      Cont to monitor 15.  RUE swelling negative duplex doppler 1/5 16.  Confusion  Likely Wernicke's encephalopathy   D/ced soma and changed oxyIR to Q6   Gabapentin reduced to 600mg  TID, d/ced on 1/9   MS contin changed to tramadol   LFTs elevated, gradually improving.    Ammonia WNL on 1/12, lactulose d/ced   UA unremarkable, U culture NG   Seroquel weaned on 1/15, DC'd on 1/17   CXR reviewed, showing likely atelectasis   EEG with generalized slowing   MRI consistent with Wernicke's encephalopathy    Appreciate neurology recs,signed off   Discussed with Hospitalist, notes reviewed, appreciate recs, signed off   Thiamine daily 17. Hypokalemia/Hypomagnesemia   K 4.2 on 1/21   Mag 1.7 on 1/18   Mag daily   K+ changed to 30 meq daily on 1/22   Labs ordered for tomorrow 18. Thrombocytopenia: Resolved   Platelets 217 on 1/21   Labs pending  LOS (Days) 22 A FACE TO FACE EVALUATION WAS PERFORMED  Justin Stone Karis Juba 08/16/2017 8:39 AM

## 2017-08-16 NOTE — Progress Notes (Signed)
Occupational Therapy Session Note  Patient Details  Name: Justin Stone MRN: [id number] Date of Birth: [birth year]  Today's Date: 08/16/2017 OT Individual Time: [id number] OT Individual Time Calculation (min): 55 min    Short Term Goals: Week 3:  OT Short Term Goal 1 (Week 3): Continue working on established LTGs set a min assist level overall.  Skilled Therapeutic Interventions/Progress Updates:    Pt confused to start session, not oriented to place, time, or situation.  Max cueing needed for orientation with pt unable to perform any recall of this throughout the session.  He completed bathing and dressing sit to supine with overall min assist and max instructional cueing to sequence.  He completed washing peri area in supine with rolling side to side for buttocks with overall min assist.  Therapist provided total assist for donning brief and then pt transitioned to sitting with min assist from sidelying.  Once on the EOB he completed washing his UB with supervision and donning hospital gown.  Min assist for sliding board transfer to the wheelchair with completion of grooming tasks with supervision.  Pt left in tilt in space wheelchair with two safety belts in place and call button and phone in reach.    Therapy Documentation Precautions:  Precautions Precautions: Fall Required Braces or Orthoses: Other Brace/Splint Other Brace/Splint: Has LLE splint for positioning of amputation Restrictions Weight Bearing Restrictions: Yes LLE Weight Bearing: Non weight bearing Other Position/Activity Restrictions: has R prosthtic leg for R BKA (OLD); L BKA (NEW)  Pain: Pain Assessment Pain Assessment: No/denies pain Pain Score: 0-No pain ADL: See Function Navigator for Current Functional Status.   Therapy/Group: Individual Therapy  Kyvon Hu OTR/L 08/16/2017, 12:09 PM

## 2017-08-16 NOTE — Progress Notes (Signed)
Physical Therapy Session Note  Patient Details  Name: Justin Stone MRN: [id number] Date of Birth: 06-11-1961  Today's Date: 08/16/2017 PT Individual Time: 1102-1159, 1616-1700 PT Individual Time Calculation (min): 57 min , 44 min   Short Term Goals: Week 3:  PT Short Term Goal 1 (Week 3): LTG=STG due to estimated length of stay  Skilled Therapeutic Interventions/Progress Updates:   Session 1:  Pt supine in bed upon PT arrival, agreeable to therapy tx and denies pain. Pt oriented x 2 this session (name, place), able to state correct year but not month or day. Pt performed rolling in each direction with supervision while therapist assisted to don pants. Pt transferred from supine>sitting EOB with min assist. Pt worked on dynamic seated balance without UE support in order to [employer] gown and don shirt. Pt transferred from bed>w/c with min assist using slideboard, verbal cues for techniques. Pt transported to gym in w/c total assist. Pt transferred from w/c>mat using slideboard and min assist. Pt transferred to supine with supervision and performed supine therex for LE strengthening 2 x 10 each: hip flexion in supine, hip abduction in sidelying and hip extension in prone. Pt prone on elbows x 3 minutes for hip flexor stretch and mobility. Pt transferred to sitting with min assist, in sitting worked on dynamic sitting balance to complete peg board puzzle, able to complete correctly without cues. Pt seated edge of mat performed 2 x 10 LAQ bilaterally. Pt transferred back to w/c with slideboard and min assist. Pt transported back to room in w/c total assist, left seated in w/c with QRB in place and needs in reach.   Session 2: Pt seated in TIS w/c upon PT arrival, agreeable to therapy tx and denies pain. Pt transported to gym in w/c total assist. Pt transferred w/c>mat using slideboard and min assist. Therapist provided regular w/c this session to work on w/c propulsion. Pt transferred mat>w/c with  slideboard and min assist. Pt propelled w/c throughout unit >150 ft with supervision using B UEs, requires max verbal cues for path finding secondary to confusion and memory deficits. Pt participated in card game working on cognition and attention. Pt propelled w/c back to room and transferred from w/c>TIS w/c with slideboard and mod assist. Pt left seated in TIS w/c with needs in reach and QRB in place.    Therapy Documentation Precautions:  Precautions Precautions: Fall Required Braces or Orthoses: Other Brace/Splint Other Brace/Splint: Has LLE splint for positioning of amputation Restrictions Weight Bearing Restrictions: Yes LLE Weight Bearing: Non weight bearing(bilateral BKA) Other Position/Activity Restrictions: has R prosthtic leg for R BKA (OLD); L BKA (NEW)     See Function Navigator for Current Functional Status.   Therapy/Group: Individual Therapy  Cresenciano GenreEmily van Schagen, PT, DPT 08/16/2017, 7:51 AM

## 2017-08-16 NOTE — Progress Notes (Signed)
Speech Language Pathology Weekly Progress and Session Note  Patient Details  Name: Justin Stone MRN: [id number] Date of Birth: 1961/06/22  Beginning of progress report period:  August 08, 2017   End of progress report period: August 16, 2017   Today's Date: 08/16/2017 SLP Individual Time: 1035-1100 SLP Individual Time Calculation (min): 25 min  Short Term Goals: Week 2: SLP Short Term Goal 1 (Week 2): STG=LTG due to ELOS    New Short Term Goals: Week 3: SLP Short Term Goal 1 (Week 3): Continue working towards max assist LTG, awaiting SNF placement   Weekly Progress Updates:  Pt has made limited functional gains this reporting period secondary to cognitive changes related to recent diagnosis of Korsakoff's Psychosis.  Pt at times can be a mod assist for basic, structured cognitive tasks in a controlled environment; however, overall he is a max to total assist due to severe cognitive dysfunction primarily related to memory deficits.  Prognosis for recovery is apparently poor per neurology.  Pt would continue to benefit from skilled ST while inpatient in order to maximize functional independence through the use of compensatory strategies.  Pt is awaiting SNF placement.     Intensity: Minumum of 1-2 x/day, 30 to 90 minutes Frequency: 3 to 5 out of 7 days Duration/Length of Stay: awaiting SNF Treatment/Interventions: Cognitive remediation/compensation;Cueing hierarchy;Functional tasks;Environmental controls;Internal/external aids;Patient/family education   Daily Session  Skilled Therapeutic Interventions: Pt was seen for skilled ST targeting cognitive goals. Pt recalled that he had not seen therapist in several days, stating "Where have you been?"  Pt generally oriented to being in a medical facility and could recall that his doctor checked on him this morning.  He needed mod assist multimodal cues for orientation to date to facilitate use of menu to order his lunch and dinner. Pt is  still confabulatory but pleasant and redirectable with mod-max assist verbal cues.  Goals updated on this date to reflect current progress and plan of care.        Function:   Eating Eating                 Cognition Comprehension Comprehension assist level: Understands basic 50 - 74% of the time/ requires cueing 25 - 49% of the time  Expression   Expression assist level: Expresses basic 50 - 74% of the time/requires cueing 25 - 49% of the time. Needs to repeat parts of sentences.  Social Interaction Social Interaction assist level: Interacts appropriately 75 - 89% of the time - Needs redirection for appropriate language or to initiate interaction.  Problem Solving Problem solving assist level: Solves basic 25 - 49% of the time - needs direction more than half the time to initiate, plan or complete simple activities  Memory Memory assist level: Recognizes or recalls less than 25% of the time/requires cueing greater than 75% of the time   General    Pain Pain Assessment Pain Assessment: No/denies pain  Therapy/Group: Individual Therapy  Justin Stone, Justin Stone 08/16/2017, 12:32 PM

## 2017-08-17 ENCOUNTER — Inpatient Hospital Stay (HOSPITAL_COMMUNITY): Payer: Medicare Other | Admitting: Physical Therapy

## 2017-08-17 ENCOUNTER — Inpatient Hospital Stay (HOSPITAL_COMMUNITY): Payer: Medicare Other | Admitting: Occupational Therapy

## 2017-08-17 ENCOUNTER — Inpatient Hospital Stay (HOSPITAL_COMMUNITY): Payer: Medicare Other | Admitting: Speech Pathology

## 2017-08-17 LAB — GLUCOSE, CAPILLARY
GLUCOSE-CAPILLARY: 212 mg/dL — AB (ref 65–99)
GLUCOSE-CAPILLARY: 122 mg/dL — AB (ref 65–99)
GLUCOSE-CAPILLARY: 101 mg/dL — AB (ref 65–99)
Glucose-Capillary: 118 mg/dL — ABNORMAL HIGH (ref 65–99)

## 2017-08-17 MED ORDER — TRAMADOL HCL 50 MG PO TABS: 50.0000 mg | ORAL_TABLET | Freq: Four times a day (QID) | ORAL | 0 refills | Status: DC | PRN

## 2017-08-17 MED ORDER — INSULIN ASPART 100 UNIT/ML ~~LOC~~ SOLN: 0.0000 [IU] | Freq: Three times a day (TID) | SUBCUTANEOUS | 11 refills | Status: AC

## 2017-08-17 MED ORDER — CALCIUM POLYCARBOPHIL 625 MG PO TABS: 1250.0000 mg | ORAL_TABLET | Freq: Every day | ORAL | Status: AC

## 2017-08-17 MED ORDER — TRAZODONE HCL 50 MG PO TABS: 25.0000 mg | ORAL_TABLET | Freq: Every evening | ORAL | 0 refills | Status: AC | PRN

## 2017-08-17 MED ORDER — VENLAFAXINE HCL 75 MG PO TABS: 75.0000 mg | ORAL_TABLET | Freq: Two times a day (BID) | ORAL | 0 refills | Status: AC

## 2017-08-17 MED ORDER — MAGNESIUM GLUCONATE 500 MG PO TABS: 500.0000 mg | ORAL_TABLET | Freq: Every day | ORAL | Status: AC

## 2017-08-17 MED ORDER — DOCUSATE SODIUM 100 MG PO CAPS: 100.0000 mg | ORAL_CAPSULE | Freq: Two times a day (BID) | ORAL | 0 refills | Status: AC

## 2017-08-17 MED ORDER — HYDRALAZINE HCL 25 MG PO TABS: 75.0000 mg | ORAL_TABLET | Freq: Three times a day (TID) | ORAL | Status: AC

## 2017-08-17 MED ORDER — KETOTIFEN FUMARATE 0.025 % OP SOLN: 1.0000 [drp] | Freq: Two times a day (BID) | OPHTHALMIC | 0 refills | Status: AC

## 2017-08-17 MED ORDER — METHOCARBAMOL 500 MG PO TABS: 500.0000 mg | ORAL_TABLET | Freq: Four times a day (QID) | ORAL | 0 refills | Status: DC | PRN

## 2017-08-17 MED ORDER — POTASSIUM CHLORIDE CRYS ER 15 MEQ PO TBCR: 30.0000 meq | EXTENDED_RELEASE_TABLET | Freq: Every day | ORAL | Status: AC

## 2017-08-17 NOTE — Progress Notes (Signed)
Physical Therapy Session Note  Patient Details  Name: Justin Stone MRN: [id number] Date of Birth: [birth year]  Today's Date: 08/17/2017 PT Individual Time: 1005-1045 PT Individual Time Calculation (min): 40 min   Short Term Goals: Week 2:  PT Short Term Goal 1 (Week 2): =LTGs due to ELOS  Skilled Therapeutic Interventions/Progress Updates: Pt received seated in tilt-in-space w/c, denies pain and agreeable to treatment. Transported pt to gym totalA. Cued pt in instructing therapist how to setup transfer, required max question cueing to correctly identify steps needed to setup chair including removing belt, sitting chair upright, placing board. SetupA for transfer; min guard for lateral scoot transfer using transfer board. Sit <>supine with S. Supine SLR 2x15 reps, bridging with bolster BLE 2x15 reps. Long sitting hamstring stretch 3x1 min as active rest breaks. Semi-reclined situps with ball toss 2x10 reps. [place] twist 2x20 reps with 1kg weighted ball. Scapular retraction x15 reps level 2 orange theraband. 2x15 external rotator "W's" with level 2 theraband, min cues for technique to maintain elbows by shoulders. Lateral scoot to transfer back to chair, min guard for uphill transfer. Remained semi-reclined in w/c, quick release and w/c seatbelt intact, all needs in reach.      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   Therapy/Group: Individual Therapy  Vista Lawmanlizabeth J Tygielski 08/17/2017, 11:16 AM

## 2017-08-17 NOTE — Progress Notes (Signed)
Social Work Patient ID: Justin PlateSheldon C Stone, male   DOB: 12-27-1960, 57 y.o.   MRN: [id number] Spoke with [facility] who has offered him a bed for tomorrow. Contact[place] to inform of [place] offer [place] He will call and talk with admissions coordinator-Angela to get his questions answered. Will work on transfer for tomorrow. Facility to get insurance authorization. Pt told of plan.

## 2017-08-17 NOTE — Plan of Care (Signed)
  Progressing RH BOWEL ELIMINATION RH STG MANAGE BOWEL WITH ASSISTANCE Description STG Manage Bowel with max Assistance.   08/17/2017 1524 - Progressing by [name], Maleny Candy J, RN RH STG MANAGE BOWEL W/MEDICATION W/ASSISTANCE Description STG Manage Bowel with Medication with max Assistance.   08/17/2017 1524 - Progressing by [name], Saidee Geremia J, RN RH SKIN INTEGRITY RH STG SKIN FREE OF INFECTION/BREAKDOWN Description Patients skin will remain free from further infection or breakdown with max assist.   08/17/2017 1524 - Progressing by [name], Kurstyn Larios J, RN RH STG MAINTAIN SKIN INTEGRITY WITH ASSISTANCE Description STG Maintain Skin Integrity With max Assistance.   08/17/2017 1524 - Progressing by [name], Carmine Carrozza J, RN RH STG ABLE TO PERFORM INCISION/WOUND CARE W/ASSISTANCE Description STG Able To Perform Incision/Wound Care With total Assistance.  08/17/2017 1524 - Progressing by [name], Jathan Balling J, RN RH PAIN MANAGEMENT RH STG PAIN MANAGED AT OR BELOW PT'S PAIN GOAL Description Pain managed with use of scheduled and PRN medications with min assist  No pain zero pain  08/17/2017 1524 - Progressing by [name], Mariacristina Aday J, RN   Not Progressing Consults RH LIMB LOSS PATIENT EDUCATION Description Description: See Patient Education module for eduction specifics. 08/17/2017 1524 - Not Progressing by [name], Waino Mounsey J, RN Skin Care Protocol Initiated - if Braden Score 18 or less Description If consults are not indicated, leave blank or document N/A 08/17/2017 1524 - Not Progressing by [name], Dontavis Tschantz J, RN Diabetes Guidelines if Diabetic/Glucose > 140 Description If diabetic or lab glucose is > 140 mg/dl - Initiate Diabetes/Hyperglycemia Guidelines & Document Interventions  08/17/2017 1524 - Not Progressing by [name], Mekiah Wahler J, RN RH SAFETY RH STG ADHERE TO SAFETY PRECAUTIONS W/ASSISTANCE/DEVICE Description STG Adhere to Safety Precautions With max Assistance/Device.   08/17/2017 1524  - Not Progressing by [name], Britney Newstrom J, RN RH KNOWLEDGE DEFICIT LIMB LOSS RH STG INCREASE KNOWLEDGE OF SELF CARE AFTER LIMB LOSS Description Patient or caregiver will verbalize understanding of limb loss education materials as presented by staff with mod assist.   08/17/2017 1524 - Not Progressing by [name], Lauri Till J, RN    Patient unable to retain information for adequate teaching.  Being discharged to SNF tomorrow.

## 2017-08-17 NOTE — Patient Care Conference (Signed)
Inpatient RehabilitationTeam Conference and Plan of Care Update Date: 08/17/2017   Time: 2:35 PM    Patient Name: Justin Stone      Medical Record Number: [id number]  Date of Birth: 03-Aug-1960 Sex: Male         Room/Bed: 4M05C/4M05C-01 Payor Info: Payor: [employer] / Plan: [employer]HC MEDICARE / Product Type: *No Product type* /    Admitting Diagnosis: bi lateral  bka  Admit Date/Time:  07/25/2017  3:05 PM Admission Comments: No comment available   Primary Diagnosis:  <principal problem not specified> Principal Problem: <principal problem not specified>  Patient Active Problem List   Diagnosis Date Noted  . Wernicke-Korsakoff syndrome (alcoholic) (HCC)   . Korsakoff's psychosis or syndrome (nonalcoholic)   . Hypomagnesemia   . Wernicke encephalopathy   . Fall   . Abdominal distention   . Confusion   . Encephalopathy   . Essential hypertension   . Ileus, postoperative (HCC)   . Confusion, postoperative   . Hypokalemia   . Loose stools   . Post-operative pain   . Type 2 diabetes mellitus with peripheral neuropathy (HCC)   . Amputation of left lower extremity below knee (HCC) 07/25/2017  . S/P bilateral BKA (below knee amputation) (HCC)   . Unilateral complete BKA, left, sequela (HCC)   . Chronic pain syndrome   . Benign essential HTN   . Leukocytosis   . Acute blood loss anemia   . Tachycardia   . Hyponatremia   . AKI (acute kidney injury) (HCC) 07/22/2017  . Osteomyelitis of left foot (HCC)   . Diabetic wet gangrene of the foot (HCC) 07/21/2017  . Gangrene of left foot (HCC) 07/21/2017  . Unilateral complete BKA, left, initial encounter (HCC) 07/21/2017  . Insomnia 05/13/2016  . Umbilical hernia without obstruction and without gangrene 05/13/2016  . Spondylosis of lumbar region without myelopathy or radiculopathy 07/22/2015  . Obesity 06/29/2015  . 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 Type II diabetes mellitus, well  controlled (HCC) 03/26/2015  . S/P BKA (below knee amputation), right (HCC) 03/26/2015  . Alcoholic cirrhosis of liver without ascites (HCC) 02/22/2015  . Chronic hepatitis C without hepatic coma (HCC) 02/22/2015  . Chronic low back pain 12/29/2014  . Essential hypertension, benign 12/29/2014  . Hyperlipidemia LDL goal <100 12/29/2014    Expected Discharge Date: Expected Discharge Date: 08/18/17  Team Members Present: Physician leading conference: Dr. Maryla Morrow Social Worker Present: Dossie Der, LCSW Nurse Present: Ronny Bacon, RN PT Present: Midge Minium, PT OT Present: Perrin Maltese, OT PPS Coordinator present : Tora Duck, RN, CRRN     Current Status/Progress Goal Weekly Team Focus  Medical   Decreased functional mobility secondary to left BKA 07/21/2017 as well as history of right BKA, now with [facility] Korsakoffs  Improve mobility, safety, cognition  See above   Bowel/Bladder   Remains incontinent of bladder/bowel - LBM 08/16/2017   Will be continent of B/B with min assist free of constipation  Assess QS ,assist with timed toileting, administer prn laxatives if no results within 2-3 days   Swallow/Nutrition/ Hydration             ADL's   supervision for UB selfcare, min assist for LB selfcare supine to sit.  Min assist sliding board transfers to wheelchair and to drop arm commode.  max asssit to toileting one on the commode.   min assist overall  selfcare retraining, balance retraining, transfer training, pt/family education, cognitive  retraining   Mobility   min-mod assist for bed mobility, transfers with slideboard, min guard for seated balance  supervision-min assist  cognition, carry-over, transfers, sitting balance, d/c planning   Communication             Safety/Cognition/ Behavioral Observations  Max-Total assist, Cognition status unchanged , No reports of falls but will attempt to get out of wheelchair,able to remove safety belt and wheelchair elt   Max Assist  ,   Need methods to prevent patient from being able to remove safety belts. Constantly addressing safety issues with patient due to limited comprehension   Pain   Denies pain upon questioning   < 3/10 -pain scale   Assess qs and prn medicate and reassess for the effectiveness of meds  and document, Notify MD or PA prn   Skin   Surgical wounds Left BKA healing well scanty amount of drainage, W-D foam dressing in place, MASD inproving to buttock and groin area with usage of prescrie medications  continue improvement of RLE wound, skin free from breakdown and infection, improvement of MASD         *See Care Plan and progress notes for long and short-term goals.     Barriers to Discharge  Current Status/Progress Possible Resolutions Date Resolved   Physician    Decreased caregiver support;Medical stability;Lack of/limited family support;Weight bearing restrictions     See above  Therapies, follow labs, minimize cognitively affecting meds      Nursing                  PT  Decreased caregiver support;Weight bearing restrictions;Behavior                 OT                  SLP Decreased caregiver support              SW                Discharge Planning/Teaching Needs:  [place] a couple of weeks      Team Discussion:  Pleasantly confused and attending therapies, participating but has no carryover or recall. Ask MD to come and see regarding sutures prior to discharge tomorrow. Needs constant cueing. Pain managed. Obtained a NH bed for tomorrow. Pt and brother agreeable to this plan.   Revisions to Treatment Plan:  NH bed tomorrow    Continued Need for Acute Rehabilitation Level of Care: The patient requires daily medical management by a physician with specialized training in physical medicine and rehabilitation for the following conditions: Daily direction of a multidisciplinary physical  rehabilitation program to ensure safe treatment while eliciting the highest outcome that is of practical value to the patient.: Yes Daily medical management of patient stability for increased activity during participation in an intensive rehabilitation regime.: Yes Daily analysis of laboratory values and/or radiology reports with any subsequent need for medication adjustment of medical intervention for : Post surgical problems;Diabetes problems;Wound care problems;Other  Lucy ChrisDupree, Leana Springston G 08/17/2017, 3:27 PM

## 2017-08-17 NOTE — Progress Notes (Signed)
Speech Language Pathology Daily Session Note  Patient Details  Name: Justin Stone MRN: [id number] Date of Birth: June 16, 1961  Today's Date: 08/17/2017 SLP Individual Time: 0900-1000 SLP Individual Time Calculation (min): 60 min  Short Term Goals: Week 3: SLP Short Term Goal 1 (Week 3): Continue working towards max assist LTG, awaiting SNF placement   Skilled Therapeutic Interventions:  Pt was seen for skilled ST targeting cognitive goals.  Pt needed mod assist verbal cues to initiate and complete self feeding tasks during breakfast meal due to decreased sustained attention to task.  Pt also needed mod-max assist verbal cues for problem solving and task initiation to mix cream and sugar into his coffee.  SLP facilitated the session with basic money management tasks to address goals for attention and problem solving.  Pt was able to count money and make change for ~75% accuracy with min-mod assist verbal cues for working memory and Armed forces operational officertask organization.  Pt needed increased assistance (max to total assist) to complete functional math calculations from word problems due to decreased automaticity of task and increased cognitive load for working memory.  Pt was transported back to room and left in wheelchair with call bell within reach.  Continue per current plan of care.  Function:  Eating Eating                 Cognition Comprehension Comprehension assist level: Understands basic 75 - 89% of the time/ requires cueing 10 - 24% of the time  Expression   Expression assist level: Expresses basic 25 - 49% of the time/requires cueing 50 - 75% of the time. Uses single words/gestures.  Social Interaction Social Interaction assist level: Interacts appropriately 75 - 89% of the time - Needs redirection for appropriate language or to initiate interaction.  Problem Solving Problem solving assist level: Solves basic less than 25% of the time - needs direction nearly all the time or does not effectively  solve problems and may need a restraint for safety  Memory Memory assist level: Recognizes or recalls less than 25% of the time/requires cueing greater than 75% of the time    Pain Pain Assessment Pain Assessment: No/denies pain  Therapy/Group: Individual Therapy  Mame Twombly, [name] 08/17/2017, 9:53 AM

## 2017-08-17 NOTE — Progress Notes (Signed)
Physical Therapy Discharge Summary  Patient Details  Name: Justin Stone MRN: [id number] Date of Birth: 25-May-1961  Today's Date: 08/17/2017   Patient has met 6 of 6 long term goals due to improved activity tolerance, improved balance, improved postural control, increased strength, increased range of motion, decreased pain and ability to compensate for deficits.  Patient to discharge at a wheelchair level [place].   Patient's care partner unavailable to provide the necessary physical and cognitive assistance at discharge; pt to d/c to SNF for continued progress towards functional goals.  Reasons goals not met: All goals met  Recommendation:  Patient will benefit from ongoing skilled PT services in skilled nursing facility setting to continue to advance safe functional mobility, address ongoing impairments in balance, strength, activity tolerance, safety awareness, and minimize fall risk.  Equipment: No equipment provided  Reasons for discharge: treatment goals met and discharge from hospital  Patient/family agrees with progress made and goals achieved: Yes  PT Discharge Precautions/Restrictions Precautions Precautions: Fall Required Braces or Orthoses: Other Brace/Splint Other Brace/Splint: Has LLE limb guard for positioning/protection of amputation Restrictions Weight Bearing Restrictions: Yes RLE Weight Bearing: Non weight bearing LLE Weight Bearing: Non weight bearing Vital Signs Therapy Vitals Temp: 97.6 F (36.4 C) Temp Source: Oral Pulse Rate: (!) 112 Resp: 18 BP: 130/66 Patient Position (if appropriate): Sitting Oxygen Therapy SpO2: 100 % O2 Device: Not Delivered Pain Pain Assessment Pain Assessment: No/denies pain Vision/Perception  Perception Perception: Within Functional Limits Praxis Praxis: Intact  Cognition Overall Cognitive Status: Impaired/Different from baseline Arousal/Alertness: Awake/alert Orientation Level: Disoriented to  situation;Disoriented to place;Disoriented to time;Oriented to person Attention: Focused Focused Attention: Appears intact Sustained Attention: Impaired Sustained Attention Impairment: Functional basic Memory: Impaired Memory Impairment: Storage deficit;Decreased recall of new information;Decreased short term memory Awareness: Impaired Awareness Impairment: Intellectual impairment;Emergent impairment;Anticipatory impairment Problem Solving: Impaired Problem Solving Impairment: Functional basic Reasoning: Impaired Reasoning Impairment: Functional basic Self Correcting: Impaired Self Correcting Impairment: Verbal basic Safety/Judgment: Impaired Sensation Sensation Light Touch: Appears Intact Stereognosis: Appears Intact Hot/Cold: Appears Intact Proprioception: Appears Intact Additional Comments: Sensation intact in BUE/BLE Coordination Gross Motor Movements are Fluid and Coordinated: Yes Fine Motor Movements are Fluid and Coordinated: Yes Coordination and Movement Description: Gross and FM coordination intact in BUEs Heel Shin Test: NT d/t amputations Motor  Motor Motor: Within Functional Limits  Mobility Bed Mobility Bed Mobility: Rolling Right;Rolling Left;Supine to Sit;Sit to Supine Rolling Right: 5: Supervision Rolling Right Details: Verbal cues for technique;Tactile cues for initiation Rolling Left: 5: Supervision Rolling Left Details: Verbal cues for technique;Tactile cues for initiation Supine to Sit: 5: Supervision Sit to Supine: 5: Supervision Transfers Transfers: Yes Lateral/Scoot Transfers: 4: Min guard;5: Supervision;With slide board Lateral/Scoot Transfer Details (indicate cue type and reason): totalA cueing for setup and assist for placement of board, setting up w/c Locomotion  Ambulation Ambulation: No Gait Gait: No Stairs / Additional Locomotion Stairs: No Wheelchair Mobility Wheelchair Mobility: Yes Wheelchair Assistance: 5: Supervision(dependent in  tilt in space w/c) Wheelchair Propulsion: Both upper extremities Wheelchair Parts Management: Needs assistance Distance: 100'  Trunk/Postural Assessment  Cervical Assessment Cervical Assessment: Within Functional Limits Thoracic Assessment Thoracic Assessment: Within Functional Limits Lumbar Assessment Lumbar Assessment: Exceptions to WFL(Posterior pelvic tilt at rest) Postural Control Postural Control: Deficits on evaluation  Balance Balance Balance Assessed: Yes Static Sitting Balance Static Sitting - Balance Support: Feet unsupported;No upper extremity supported Static Sitting - Level of Assistance: 5: Stand by assistance Dynamic Sitting Balance Dynamic Sitting - Balance Support: During functional activity Dynamic  Sitting - Level of Assistance: 5: Stand by assistance Extremity Assessment  RUE Assessment RUE Assessment: Within Functional Limits LUE Assessment LUE Assessment: Within Functional Limits RLE Assessment RLE Assessment: (pre-existing BKA; ROM and strength grossly WFL) LLE Assessment LLE Assessment: Exceptions to WFL(new BKA, assessment limited d/t pain; hip strength and ROM grossly WFL, hamstring length [facility])   See Function Navigator for Current Functional Status.  Benjiman Core Tygielski 08/17/2017, 4:16 PM

## 2017-08-17 NOTE — Progress Notes (Signed)
Speech Language Pathology Discharge Summary  Patient Details  Name: Justin Stone MRN: [id number] Date of Birth: 07-14-1961   Patient has met 2 of 4 long term goals.  Patient to discharge at overall Max level.   Clinical Impression/Discharge Summary:  Pt is discharging having met 2 out of 4 long term goals; however, goals did have two be downgraded while inpatient due to minimal progress.  Pt's primary cognitive deficit is his almost complete incapacity to store information in the setting of recent Korsakoff Psychosis diagnosis.  Prognosis for recovery is apparently poor; however, pt may benefit from ongoing ST at next level of care to address memory compensatory strategies to maximize whatever potential he has for recovery.  Pt is discharging to SNF.  No family has been present for formal education at this time.    Care Partner:  Caregiver Able to Provide Assistance: (SNF)  Type of Caregiver Assistance: (SNF)  Recommendation:  Skilled Nursing facility  Rationale for SLP Follow Up: Maximize cognitive function and independence;Reduce caregiver burden   Equipment: memory notebook    Reasons for discharge: Discharged from hospital   Patient/Family Agrees with Progress Made and Goals Achieved: Yes   Function:  Eating Eating                 Cognition Comprehension Comprehension assist level: Understands basic 90% of the time/cues < 10% of the time  Expression   Expression assist level: Expresses basic 50 - 74% of the time/requires cueing 25 - 49% of the time. Needs to repeat parts of sentences.  Social Interaction Social Interaction assist level: Interacts appropriately 90% of the time - Needs monitoring or encouragement for participation or interaction.  Problem Solving Problem solving assist level: Solves basic 25 - 49% of the time - needs direction more than half the time to initiate, plan or complete simple activities  Memory Memory assist level: Recognizes or recalls less  than 25% of the time/requires cueing greater than 75% of the time   Emilio Math 08/17/2017, 4:19 PM

## 2017-08-17 NOTE — Progress Notes (Signed)
[place] PHYSICAL MEDICINE & REHABILITATION     PROGRESS NOTE  Subjective/Complaints:  Pt seen lying in bed this AM.  No reported issues overnight.  Remains pleasantly confused.   ROS: Unable to assess due to cognition.   Objective: Vital Signs: Blood pressure 140/78, pulse (!) 112, temperature 97.9 F (36.6 C), temperature source Oral, resp. rate 18, height 5\' 7"  (1.702 m), weight 95.3 kg (210 lb 1.6 oz), SpO2 97 %. No results found. Recent Labs    08/15/17 0729  WBC 8.1  HGB 10.4*  HCT 31.9*  PLT 217   Recent Labs    08/15/17 0729  NA 139  K 4.2  CL 112*  GLUCOSE 121*  BUN <5*  CREATININE 0.82  CALCIUM 9.3   CBG (last 3)  Recent Labs    08/16/17 1657 08/16/17 2150 08/17/17 0639  GLUCAP 103* 124* 118*    Wt Readings from Last 3 Encounters:  08/10/17 95.3 kg (210 lb 1.6 oz)  07/21/17 113.4 kg (250 lb)  07/21/17 113.8 kg (250 lb 12.8 oz)    Physical Exam:  BP 140/78   Pulse (!) 112   Temp 97.9 F (36.6 C) (Oral)   Resp 18   Ht 5\' 7"  (1.702 m)   Wt 95.3 kg (210 lb 1.6 oz)   SpO2 97%   BMI 32.91 kg/m  General: NAD. Vitals stable. HENT: Normocephalic. Atraumatic Eyes: EOMI. No discharge Cardiac. RRR. No JVD. Respiratory. Clear to auscultation. Unlabored.  Abdomen: good bowel sounds. nondistended Musculoskeletal: Bilateral BKA Neurological. Alert Motor: B/l UE 5/5 proximal to distal LLE: HF, KE 4+/5  RLE: HF, KE 4+/5 Skin. Left BKA incision C/D/I  Right BKA incision c/d/i Psych: Confabulation, persistent  Assessment/Plan: 1. Functional deficits secondary to bilateral BKA which require 3+ hours per day of interdisciplinary therapy in a comprehensive inpatient rehab setting. Physiatrist is providing close team supervision and 24 hour management of active medical problems listed below. Physiatrist and rehab team continue to assess barriers to discharge/monitor patient progress toward functional and medical goals.  Function:  Bathing Bathing  position   Position: Bed  Bathing parts Body parts bathed by patient: Right arm, Chest, Abdomen, Right upper leg, Left upper leg, Left arm, Front perineal area, Buttocks, Back Body parts bathed by helper: Back  Bathing assist Assist Level: Supervision or verbal cues      Upper Body Dressing/Undressing Upper body dressing   What is the patient wearing?: Pull over shirt/dress     Pull over shirt/dress - Perfomed by patient: Thread/unthread right sleeve, Thread/unthread left sleeve, Put head through opening, Pull shirt over trunk Pull over shirt/dress - Perfomed by helper: Pull shirt over trunk        Upper body assist Assist Level: Supervision or verbal cues   Set up : To obtain clothing/put away  Lower Body Dressing/Undressing Lower body dressing   What is the patient wearing?: Pants Underwear - Performed by patient: Thread/unthread right underwear leg, Thread/unthread left underwear leg, Pull underwear up/down Underwear - Performed by helper: Pull underwear up/down Pants- Performed by patient: Thread/unthread right pants leg, Thread/unthread left pants leg Pants- Performed by helper: Pull pants up/down                      Lower body assist Assist for lower body dressing: Touching or steadying assistance (Pt > 75%)      Toileting Toileting Toileting activity did not occur: No continent bowel/bladder event Toileting steps completed by patient: Performs perineal  hygiene Toileting steps completed by helper: Adjust clothing prior to toileting, Performs perineal hygiene, Adjust clothing after toileting Toileting Assistive Devices: Grab bar or rail, Prosthesis/orthosis  Toileting assist Assist level: Two helpers   Transfers Chair/bed transfer   Chair/bed transfer method: Lateral scoot Chair/bed transfer assist level: Touching or steadying assistance (Pt > 75%) Chair/bed transfer assistive device: Sliding board Mechanical lift: Maximove   Locomotion Ambulation  Ambulation activity did not occur: Safety/medical concerns         Wheelchair   Type: Manual Max wheelchair distance: 100' Assist Level: Dependent (Pt equals 0%)(TIS)  Cognition Comprehension Comprehension assist level: Understands basic 75 - 89% of the time/ requires cueing 10 - 24% of the time  Expression Expression assist level: Expresses basic 25 - 49% of the time/requires cueing 50 - 75% of the time. Uses single words/gestures.  Social Interaction Social Interaction assist level: Interacts appropriately 75 - 89% of the time - Needs redirection for appropriate language or to initiate interaction.  Problem Solving Problem solving assist level: Solves basic less than 25% of the time - needs direction nearly all the time or does not effectively solve problems and may need a restraint for safety  Memory Memory assist level: Recognizes or recalls less than 25% of the time/requires cueing greater than 75% of the time    Medical Problem List and Plan: 1.  Decreased functional mobility secondary to left BKA 07/21/2017 as well as history of right BKA. Patient does use a right prosthesis   Cont CIR  2.  DVT Prophylaxis/Anticoagulation: Subcutaneous Lovenox. Monitor for any bleeding episodes 3. Pain Management/chronic pain: Soma 350 mg 3 times a day, Neurontin 1200 mg 3 times a day, MS Contin 15 mg every 12 hours, Robaxin and oxycodone as needed 4. Mood: Effexor 75 mg twice a day Trazodone 25 mg daily at bedtime as needed 5. Neuropsych: This patient is not capable of making decisions on his own behalf. 6. Skin/Wound Care: Routine skin checks 7. Fluids/Electrolytes/Nutrition: Routine I&O's   Cr. 0.82 on 1/21 8. Acute blood loss anemia. Continue iron supplement.    Hb 10.4 on 1/21   Cont to monitor 9. Diabetes mellitus and peripheral neuropathy. Hemoglobin A1c 6.5. Currently with SSI. Check blood sugars before meals and at bedtime. Diabetic teaching. Patient on Glucophage 500 mg twice a day  prior to admission. Resume as needed   Metformin 500 daily started on 1/2, d/ced due to watery stool   Monitor with increased mobility CBG (last 3)  Recent Labs    08/16/17 1657 08/16/17 2150 08/17/17 0639  GLUCAP 103* 124* 118*    Relatively controlled on 1/23 10. Hypertension. Norvasc 10 mg daily.   Hydralazine 10 TID started on 1/9, increased to 25 on 1/10, increased to 50 on 1/16, increased to 75 on 1/18   Lisinopril 2.5 added on 1/12, increased to 10 on 1/13   Relatively controlled on 1/23   Cont to monitor 11. Hyperlipidemia. Zocor 12.  Ileus: Resolved, now with loose stools   Multifactorial, including cessation of chronic opiods   Pt also recently started on metformin will hold for now   KUB reviewed, improving   C. difficile neg   Fiber added 1/13, increased on 1/15   Confounded by magnesium supplementation   Improving 13. Hyponatremia: Resolved   Cont to monitor 14. Leukocytosis: Resolved   Afebrile      Cont to monitor 15.  RUE swelling negative duplex doppler 1/5 16.  Confusion   Likely Wernicke's encephalopathy  D/ced soma and changed oxyIR to Q6   Gabapentin reduced to 600mg  TID, d/ced on 1/9   MS contin changed to tramadol   LFTs elevated, gradually improving.    Ammonia WNL on 1/12, lactulose d/ced   UA unremarkable, U culture NG   Seroquel weaned on 1/15, DC'd on 1/17   CXR reviewed, showing likely atelectasis   EEG with generalized slowing   MRI consistent with Wernicke's encephalopathy    Appreciate neurology recs,signed off   Discussed with Hospitalist, notes reviewed, appreciate recs, signed off   Thiamine daily 17. Hypokalemia/Hypomagnesemia   K 4.2 on 1/21   Mag 1.7 on 1/18   Mag daily   K+ changed to 30 meq daily on 1/22   Labs pending 18. Thrombocytopenia: Resolved   Platelets 217 on 1/21   Labs pending 19. Tachycardia   ECG ordered  LOS (Days) 23 A FACE TO FACE EVALUATION WAS PERFORMED  Ankit Karis Juba 08/17/2017 8:58 AM

## 2017-08-17 NOTE — Discharge Summary (Signed)
Discharge summary job [id number]

## 2017-08-17 NOTE — Discharge Summary (Signed)
Justin Stone, Justin Stone               ACCOUNT NO.:  [id number]  MEDICAL RECORD NO.:  [id number]  LOCATION:  4M05C                        FACILITY:  [facility]  PHYSICIAN:  Mariam Dollar, P.A.  DATE OF BIRTH:  1961-01-26  DATE OF ADMISSION:  07/25/2017 DATE OF DISCHARGE:  08/18/2017                              DISCHARGE SUMMARY   DISCHARGE DIAGNOSES: 1. Left below knee amputation, July 21, 2017, as well as history     of right below knee amputation. 2. Subcutaneous Lovenox for deep vein thrombosis prophylaxis. 3. Chronic pain management. 4. Mood. 5. Acute blood loss anemia. 6. Diabetes mellitus, peripheral neuropathy. 7. Hypertension. 8. Hyperlipidemia. 9. Ileus, resolved. 10.Confusion - Wernicke's encephalopathy. 11.Hyponatremia. 12.Leukocytosis, resolved. 13.Tachycardia.  HISTORY OF PRESENT ILLNESS:  This is a 57 year old right-handed male with history of diabetes mellitus, chronic pain syndrome, hypertension, and right BKA several years ago.  He lives alone, used a cane prior to admission, had a right prosthesis.  Presented on July 21, 2017, with ulceration to left foot, no change with conservative care.  X-rays of left foot consistent with cellulitis and osteomyelitis.  He underwent left BKA on July 21, 2017, per Dr. Victorino Dike.  Hospital course, pain management.  Bouts of confusion, narcotics adjusted.  Acute blood loss anemia of 10.2 and leukocytosis of 16,000.  Subcutaneous Lovenox for DVT prophylaxis.  Physical and occupational therapy ongoing.  The patient was admitted for comprehensive rehab program.  PAST MEDICAL HISTORY:  See discharge diagnoses.  SOCIAL HISTORY:  Lives alone. Used a cane prior to admission.  He had a right prosthesis.  FUNCTIONAL STATUS:  Upon admission to rehab services; minimal assist, lateral scoot transfers, minimal guard supine to sit, mod max assist with activities of daily living.  PHYSICAL EXAMINATION:  VITAL SIGNS:  Blood  pressure 153/80, pulse 100, temperature 98, and respirations 18. GENERAL:  Alert male, had some delay in processing.  He could not recall his full hospital course.  There was some confabulation.  Followed simple commands. EYES:  EOMs intact. NECK:  Supple.  Nontender.  No JVD. CARDIAC:  Rate mildly tachycardic at 100 beats per minute. LUNGS:  Clear to auscultation.  No wheeze. ABDOMEN:  Soft and nontender.  Good bowel sounds. MUSCULOSKELETAL:  BKA site with staples intact.  Right BKA with small friction area at the base of the stump.  REHABILITATION HOSPITAL COURSE:  The patient was admitted to Inpatient Rehab Services with therapies initiated on a 3-hour daily basis, consisting of physical therapy, occupational therapy, speech therapy, and rehabilitation nursing.  The following issues were addressed during the patient's rehabilitation stay.  Pertaining to Mr. [name], left BKA on July 21, 2017.  Limb guard had since been removed.  He would follow up with Dr. Victorino Dike of Orthopedic Services.  Staples are to be removed. Had a history of a right BKA.  He seldom uses right prosthesis. Subcutaneous Lovenox for DVT prophylaxis.  No bleeding episodes. Chronic pain management, adjusted accordingly for bouts of confusion. Currently maintained on Robaxin as needed for muscle spasms as well as tramadol.  Acute blood loss anemia, stable at 10.4.  He remained on Effexor as well as trazodone as needed at bedtime for sleep.  Diabetes mellitus, peripheral neuropathy.  Hemoglobin A1c of 6.5.  Initially on Glucophage, discontinued due to ongoing bouts of diarrhea.  Blood pressures controlled with present regimen, no orthostatic changes. Hospital course complicated by ileus.  His diet was downgraded.  Latest KUB showed improvement.  Clostridium difficile negative.  Fiber was added to his regimen due to loose stools.  Noted persistent confusion. There was some discussion with the family of the patient's  history of alcohol use and did not appear to be alcohol withdrawal.  Cranial CT scan remarkable for suspect Wernicke's encephalopathy per Neurology Services.  EEG showed generalized slowing and no seizure.  Urine study was unremarkable.  Bouts of tachycardia.  EKG unremarkable.  No chest pain or increasing shortness of breath.  Reviewed by Cardiology Services.  The patient received weekly collaborative interdisciplinary team conferences to discuss estimated length of stay, family teaching, any barriers to his discharge.  Transports to the gym with total assistance, needing cuing for simple instructions on how to set up transfers.  Minimal guard for lateral scoot transfers using transfer sliding board, sit to supine with supervision, activities of daily living and homemaking.  He completed bathing supine to sit with max instructional cuing to sequence and minimal assist overall.  Minimal assist for side-lying to sitting with supervision for sitting balance. He was able to don and pull-over shirt in sitting with supervision, transition back to supine, rolling to pull his pants over his hips.  Due to limited assistance at home, it was felt skilled nursing facility was needed with bed becoming available on August 18, 2017.  DISCHARGE MEDICATIONS:  Included; 1. Norvasc 10 mg p.o. daily. 2. Aspirin 81 mg p.o. daily. 3. Colace 100 mg p.o. b.i.d. 4. Ferrous sulfate 325 mg p.o. b.i.d. 5. Hydralazine 75 mg p.o. every 8 hours. 6. Zaditor one drop to both eyes twice daily. 7. Lisinopril 10 mg p.o. daily. 8. Magnesium gluconate 500 mg p.o. daily. 9. Insulin NovoLog 5 units t.i.d SSI. 10.FiberCon 1250 mg p.o. daily. 11.Zocor 10 mg p.o. at bedtime. 12.Potassium chloride 30 mEq p.o. daily. 13.Effexor 75 mg p.o. b.i.d. 14.Robaxin 500 mg p.o. every 6 hours as needed for muscle spasms. 15.Tramadol 50 mg p.o. every 6 hours as needed for pain 16. Trazodone 25 mg daily at bedtime as needed  sleep  DIET:  Mechanical soft.  FOLLOWUP:  The patient would follow up with Dr. Maryla MorrowAnkit Patel at the Outpatient Rehab [facility] Office as advised; Dr. Toni ArthursJohn Hewitt in 2 weeks.  SPECIAL INSTRUCTIONS:  Continue to monitor chemistries weekly with noted persistent hypokalemia, currently on potassium supplement.  This could be adjusted accordingly based on chemistries.     Mariam Dollaraniel Ellanore Vanhook, P.A.     DA/MEDQ  D:  08/17/2017  T:  08/17/2017  Job:  [id number]  cc:   [facility] Maryla MorrowAnkit Patel, MD Toni ArthursJohn Hewitt, MD

## 2017-08-17 NOTE — Progress Notes (Signed)
Occupational Therapy Session Note  Patient Details  Name: Justin Stone MRN: [id number] Date of Birth: Sep 18, 1960  Today's Date: 08/17/2017 OT Individual Time: 0800-0900 OT Individual Time Calculation (min): 60 min    Short Term Goals: Week 3:  OT Short Term Goal 1 (Week 3): Continue working on established LTGs set a min assist level overall.  Skilled Therapeutic Interventions/Progress Updates:    Pt completed bathing, dressing, and grooming during session.  Not oriented to place, time, or situation.  He completed bathing supine to sit with max instructional cueing to sequence and min assist overall.  Min assist for sidelying to sit with supervision for sitting balance.  He was able to donn pullover shirt in sitting with supervision and then transitioned back to supine rolling to pull pants over hips, after donning them over his legs in sitting.  He then utilized sliding board for transfer over to the wheelchair with min assist after placement.  Finished session with grooming tasks of brushing teeth and hair at the sink with supervision and min instructional cueing.  Pt left at bedside eating breakfast with safety belts in place and call button and phone in place.    Therapy Documentation Precautions:  Precautions Precautions: Fall Required Braces or Orthoses: Other Brace/Splint Other Brace/Splint: Has LLE splint for positioning of amputation Restrictions Weight Bearing Restrictions: Yes LLE Weight Bearing: Non weight bearing Other Position/Activity Restrictions: has R prosthtic leg for R BKA (OLD); L BKA (NEW)  Pain: Pain Assessment Pain Assessment: No/denies pain ADL: See Function Navigator for Current Functional Status.   Therapy/Group: Individual Therapy  Taber Sweetser OTR/L 08/17/2017, 9:01 AM

## 2017-08-17 NOTE — Progress Notes (Signed)
Occupational Therapy Discharge Summary  Patient Details  Name: Justin Stone MRN: [id number] Date of Birth: May 14, 1961  Today's Date: 08/17/2017 OT Individual Time: 1500-1529 OT Individual Time Calculation (min): 29 min   Session Note:  Pt completed therex exercises bilaterally for shoulder flexion, shoulder horizontal abduction, shoulder extension, and elbow extension.  He completed 1 set of 10 fore each exercise using the medium resistant orange theraband.  Mod instructional cueing for form and repetition count.  He also completed 2 sets of 2.5-3 mins on the UE ergonometer with resistance set on level 10 and RPMs maintained at around 25.  Returned to room at end of session with call button and phone in reach.  Safety belt also in place as well with family friend in room.    Patient has met 7 of 8 long term goals due to improved activity tolerance, improved balance, postural control and ability to compensate for deficits.  Patient to discharge at [facility] Assist level.  Patient's care partner unavailable to provide the necessary physical and cognitive assistance at discharge.    Reasons goals not met: Pt needs mod to max assist for toileting in sitting.   Recommendation:  Patient will benefit from ongoing skilled OT services in skilled nursing facility setting to continue to advance functional skills in the area of BADL and Reduce care partner burden.  Pt overall at min assist level but needs max instructional cueing for sustained attention to self care tasks and for sequencing.  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  Will benefit from continued OT to hopefully progress to a supervision level for selfcare tasks, but anticipate pt will need 24 hour for the rest of his life secondary to impaired cognition and problem solving.     Equipment: No equipment provided  Reasons for discharge: treatment goals met and discharge from  hospital  Patient/family agrees with progress made and goals achieved: Yes  OT Discharge Precautions/Restrictions  Precautions Precautions: Fall Required Braces or Orthoses: Other Brace/Splint Other Brace/Splint: Has LLE splint for positioning of amputation  Pain Pain Assessment Pain Assessment: No/denies pain ADL See Function Section of chart for details  Vision Baseline Vision/History: Wears glasses Wears Glasses: At all times Patient Visual Report: No change from baseline Vision Assessment?: No apparent visual deficits Perception  Perception: Within Functional Limits Praxis Praxis: Intact Cognition Overall Cognitive Status: Impaired/Different from baseline Arousal/Alertness: Awake/alert Orientation Level: Disoriented to situation;Disoriented to place;Disoriented to time Attention: Focused Focused Attention: Appears intact Sustained Attention: Impaired Sustained Attention Impairment: Functional basic Memory: Impaired Memory Impairment: Storage deficit;Decreased recall of new information;Decreased short term memory Awareness: Impaired Awareness Impairment: Intellectual impairment;Emergent impairment;Anticipatory impairment Problem Solving: Impaired Problem Solving Impairment: Functional basic Reasoning: Impaired Reasoning Impairment: Functional basic Safety/Judgment: Impaired Sensation Sensation Light Touch: Appears Intact Stereognosis: Appears Intact Hot/Cold: Appears Intact Proprioception: Appears Intact Additional Comments: Sensation intact in BUEs Coordination Gross Motor Movements are Fluid and Coordinated: Yes Fine Motor Movements are Fluid and Coordinated: Yes Coordination and Movement Description: Gross and FM coordination intact in BUEs Motor  Motor Motor: Within Functional Limits Mobility  Bed Mobility Bed Mobility: Rolling Right;Rolling Left;Supine to Sit;Sit to Supine Rolling Right: 5: Supervision Rolling Left: 5: Supervision Supine to Sit:  4: Min assist Sit to Supine: 5: Supervision  Trunk/Postural Assessment  Cervical Assessment Cervical Assessment: Within Functional Limits Thoracic Assessment Thoracic Assessment: Within Functional Limits Lumbar Assessment Lumbar Assessment: Exceptions to WFL(Posterior pelvic tilt at rest) Postural Control Postural Control: Deficits on evaluation  Balance Balance Balance Assessed: Yes Static Sitting Balance Static Sitting - Balance Support: Feet unsupported;No upper extremity supported Static Sitting - Level of Assistance: 5: Stand by assistance Dynamic Sitting Balance Dynamic Sitting - Balance Support: During functional activity Dynamic Sitting - Level of Assistance: 5: Stand by assistance Extremity/Trunk Assessment RUE Assessment RUE Assessment: Within Functional Limits LUE Assessment LUE Assessment: Within Functional Limits   See Function Navigator for Current Functional Status.  Orin Eberwein OTR/L 08/17/2017, 4:04 PM

## 2017-08-18 ENCOUNTER — Inpatient Hospital Stay (HOSPITAL_COMMUNITY): Payer: Medicare Other | Admitting: Occupational Therapy

## 2017-08-18 ENCOUNTER — Inpatient Hospital Stay (HOSPITAL_COMMUNITY): Payer: Medicare Other | Admitting: Speech Pathology

## 2017-08-18 ENCOUNTER — Inpatient Hospital Stay (HOSPITAL_COMMUNITY): Payer: Medicare Other | Admitting: Physical Therapy

## 2017-08-18 DIAGNOSIS — G8911 Acute pain due to trauma: Secondary | ICD-10-CM | POA: Diagnosis not present

## 2017-08-18 DIAGNOSIS — R0989 Other specified symptoms and signs involving the circulatory and respiratory systems: Secondary | ICD-10-CM | POA: Diagnosis not present

## 2017-08-18 DIAGNOSIS — D500 Iron deficiency anemia secondary to blood loss (chronic): Secondary | ICD-10-CM | POA: Diagnosis not present

## 2017-08-18 DIAGNOSIS — I8290 Acute embolism and thrombosis of unspecified vein: Secondary | ICD-10-CM | POA: Diagnosis not present

## 2017-08-18 DIAGNOSIS — I10 Essential (primary) hypertension: Secondary | ICD-10-CM | POA: Diagnosis not present

## 2017-08-18 DIAGNOSIS — M1990 Unspecified osteoarthritis, unspecified site: Secondary | ICD-10-CM | POA: Diagnosis not present

## 2017-08-18 DIAGNOSIS — Z4781 Encounter for orthopedic aftercare following surgical amputation: Secondary | ICD-10-CM | POA: Diagnosis not present

## 2017-08-18 DIAGNOSIS — Z89512 Acquired absence of left leg below knee: Secondary | ICD-10-CM | POA: Diagnosis not present

## 2017-08-18 DIAGNOSIS — M62838 Other muscle spasm: Secondary | ICD-10-CM | POA: Diagnosis not present

## 2017-08-18 DIAGNOSIS — D649 Anemia, unspecified: Secondary | ICD-10-CM | POA: Diagnosis not present

## 2017-08-18 DIAGNOSIS — J849 Interstitial pulmonary disease, unspecified: Secondary | ICD-10-CM | POA: Diagnosis not present

## 2017-08-18 DIAGNOSIS — E871 Hypo-osmolality and hyponatremia: Secondary | ICD-10-CM | POA: Diagnosis not present

## 2017-08-18 DIAGNOSIS — E512 Wernicke's encephalopathy: Secondary | ICD-10-CM | POA: Diagnosis not present

## 2017-08-18 DIAGNOSIS — E1140 Type 2 diabetes mellitus with diabetic neuropathy, unspecified: Secondary | ICD-10-CM | POA: Diagnosis not present

## 2017-08-18 DIAGNOSIS — K219 Gastro-esophageal reflux disease without esophagitis: Secondary | ICD-10-CM | POA: Diagnosis not present

## 2017-08-18 DIAGNOSIS — R140 Abdominal distension (gaseous): Secondary | ICD-10-CM | POA: Diagnosis not present

## 2017-08-18 DIAGNOSIS — M8580 Other specified disorders of bone density and structure, unspecified site: Secondary | ICD-10-CM | POA: Diagnosis not present

## 2017-08-18 DIAGNOSIS — M6281 Muscle weakness (generalized): Secondary | ICD-10-CM | POA: Diagnosis not present

## 2017-08-18 DIAGNOSIS — I517 Cardiomegaly: Secondary | ICD-10-CM | POA: Diagnosis not present

## 2017-08-18 DIAGNOSIS — I739 Peripheral vascular disease, unspecified: Secondary | ICD-10-CM | POA: Diagnosis not present

## 2017-08-18 DIAGNOSIS — G894 Chronic pain syndrome: Secondary | ICD-10-CM | POA: Diagnosis not present

## 2017-08-18 DIAGNOSIS — R278 Other lack of coordination: Secondary | ICD-10-CM | POA: Diagnosis not present

## 2017-08-18 DIAGNOSIS — R1311 Dysphagia, oral phase: Secondary | ICD-10-CM | POA: Diagnosis not present

## 2017-08-18 DIAGNOSIS — E785 Hyperlipidemia, unspecified: Secondary | ICD-10-CM | POA: Diagnosis not present

## 2017-08-18 DIAGNOSIS — Z89511 Acquired absence of right leg below knee: Secondary | ICD-10-CM | POA: Diagnosis not present

## 2017-08-18 DIAGNOSIS — E876 Hypokalemia: Secondary | ICD-10-CM | POA: Diagnosis not present

## 2017-08-18 DIAGNOSIS — R000 Tachycardia, unspecified: Secondary | ICD-10-CM | POA: Diagnosis not present

## 2017-08-18 DIAGNOSIS — M6250 Muscle wasting and atrophy, not elsewhere classified, unspecified site: Secondary | ICD-10-CM | POA: Diagnosis not present

## 2017-08-18 DIAGNOSIS — E1151 Type 2 diabetes mellitus with diabetic peripheral angiopathy without gangrene: Secondary | ICD-10-CM | POA: Diagnosis not present

## 2017-08-18 DIAGNOSIS — G9340 Encephalopathy, unspecified: Secondary | ICD-10-CM | POA: Diagnosis not present

## 2017-08-18 LAB — COMPREHENSIVE METABOLIC PANEL
ALBUMIN: 3 g/dL — AB (ref 3.5–5.0)
ALT: 83 U/L — ABNORMAL HIGH (ref 17–63)
ANION GAP: 11 (ref 5–15)
AST: 101 U/L — AB (ref 15–41)
Alkaline Phosphatase: 138 U/L — ABNORMAL HIGH (ref 38–126)
BILIRUBIN TOTAL: 1 mg/dL (ref 0.3–1.2)
BUN: 8 mg/dL (ref 6–20)
CHLORIDE: 109 mmol/L (ref 101–111)
CO2: 16 mmol/L — ABNORMAL LOW (ref 22–32)
Calcium: 9.1 mg/dL (ref 8.9–10.3)
Creatinine, Ser: 0.92 mg/dL (ref 0.61–1.24)
GFR calc Af Amer: >60 mL/min (ref >60)
GLUCOSE: 122 mg/dL — AB (ref 65–99)
POTASSIUM: 3.9 mmol/L (ref 3.5–5.1)
Sodium: 136 mmol/L (ref 135–145)
TOTAL PROTEIN: 7.4 g/dL (ref 6.5–8.1)

## 2017-08-18 LAB — GLUCOSE, CAPILLARY: GLUCOSE-CAPILLARY: 126 mg/dL — AB (ref 65–99)

## 2017-08-18 LAB — MAGNESIUM: MAGNESIUM: 1.6 mg/dL — AB (ref 1.7–2.4)

## 2017-08-18 NOTE — Progress Notes (Signed)
[facility]  Answered all questions to RN satisfaction.  Patient awaiting transport.  [name], Wilna Pennie J, RN

## 2017-08-18 NOTE — Progress Notes (Signed)
Social Work  Discharge Note  The overall goal for the admission was met for:   Discharge location: Yes-[facility]  Length of Stay: Yes-24 DAYS  Discharge activity level: No-MIN ASSIST LEVEL  Home/community participation: Yes  Services provided included: MD, RD, PT, OT, SLP, RN, CM, TR, Pharmacy, Neuropsych and SW  Financial Services: Private Insurance: [facility]  Follow-up services arranged: Other: NHP  Comments (or additional information):PT HAS NO ONE TO PROVIDE THE CARE HE REQUIRES HOPING MORE THERAPY WILL HELP HIM AND BROTHER TO DECIDE TO MOVE HIM CLOSER TO HIM IN [place]  Patient/Family verbalized understanding of follow-up arrangements: Yes  Individual responsible for coordination of the follow-up plan: [facility] DERRICK-BROTHER  Confirmed correct DME delivered: Elease Hashimoto 08/18/2017    Elease Hashimoto

## 2017-08-18 NOTE — Progress Notes (Signed)
[facility] arrived to transport [facility] to [facility].  Left floor via stretcher.  Appears to be in no immediate distress at this time.  Information packet sent with [facility] staff.  [name], Shera Laubach J, RN

## 2017-08-18 NOTE — Plan of Care (Signed)
  Not Met (add Reason) Consults RH LIMB LOSS PATIENT EDUCATION Description Description: See Patient Education module for eduction specifics. 08/18/2017 6147 - Not Met (add Reason) by Brita Romp, RN Skin Care Protocol Initiated - if Braden Score 18 or less Description If consults are not indicated, leave blank or document N/A 08/18/2017 0854 - Not Met (add Reason) by Brita Romp, RN RH KNOWLEDGE DEFICIT LIMB LOSS RH STG INCREASE KNOWLEDGE OF SELF CARE AFTER LIMB LOSS Description Patient or caregiver will verbalize understanding of limb loss education materials as presented by staff with mod assist.   08/18/2017 0929 - Not Met (add Reason) by Brita Romp, RN   Completed/Met Consults Diabetes Guidelines if Diabetic/Glucose > 140 Description If diabetic or lab glucose is > 140 mg/dl - Initiate Diabetes/Hyperglycemia Guidelines & Document Interventions  08/18/2017 0854 - Completed/Met by Brita Romp, RN RH BOWEL ELIMINATION RH STG MANAGE BOWEL WITH ASSISTANCE Description STG Manage Bowel with max Assistance.   08/18/2017 0854 - Completed/Met by Brita Romp, RN RH STG MANAGE BOWEL W/MEDICATION W/ASSISTANCE Description STG Manage Bowel with Medication with max Assistance.   08/18/2017 0854 - Completed/Met by Brita Romp, RN RH SKIN INTEGRITY RH STG SKIN FREE OF INFECTION/BREAKDOWN Description Patients skin will remain free from further infection or breakdown with max assist.   08/18/2017 0854 - Completed/Met by Brita Romp, RN RH STG MAINTAIN SKIN INTEGRITY WITH ASSISTANCE Description STG Maintain Skin Integrity With max Assistance.   08/18/2017 0854 - Completed/Met by Brita Romp, RN RH STG ABLE TO PERFORM INCISION/WOUND CARE W/ASSISTANCE Description STG Able To Perform Incision/Wound Care With total Assistance.  08/18/2017 0854 - Completed/Met by Brita Romp, RN RH SAFETY RH STG ADHERE TO SAFETY PRECAUTIONS  W/ASSISTANCE/DEVICE Description STG Adhere to Safety Precautions With max Assistance/Device.   08/18/2017 0854 - Completed/Met by Brita Romp, RN RH PAIN MANAGEMENT RH STG PAIN MANAGED AT OR BELOW PT'S PAIN GOAL Description Pain managed with use of scheduled and PRN medications with min assist  No pain zero pain  08/18/2017 0854 - Completed/Met by Brita Romp, RN    Patient unable to retain educational information due to cognition.

## 2017-08-18 NOTE — Progress Notes (Signed)
Patient valuables locked up in security.  Inquired who we should be giving items to since patient is being transferred to SNF and is incompetent to take possession himself.  SW advised to call patients brother, who is making all medical decisions on his behalf.  Justin Stone advised RN to give possessions to patients friend "Park [employer]" and that he would be calling him to come pick them up from us today.  [name], Deyon Chizek J, RN

## 2017-08-18 NOTE — Progress Notes (Signed)
Gave patients belongings (including valuables retrieved from security) to MelvernNed, patients friend with authorization from patients brother, Jonny RuizJohn.  Patient awaiting transport to NH.  [name], Annita Ratliff J, RN

## 2017-08-18 NOTE — Progress Notes (Signed)
[place] PHYSICAL MEDICINE & REHABILITATION     PROGRESS NOTE  Subjective/Complaints:  Pt seen lying in bed this AM.  No reported issues overnight.  Plan to transfer to SNF today.   ROS: Unable to assess due to cognition.   Objective: Vital Signs: Blood pressure 133/82, pulse (!) 105, temperature 98.9 F (37.2 C), temperature source Oral, resp. rate 18, height 5\' 7"  (1.702 m), weight 95.3 kg (210 lb 1.6 oz), SpO2 93 %. No results found. No results for input(s): WBC, HGB, HCT, PLT in the last 72 hours. No results for input(s): NA, K, CL, GLUCOSE, BUN, CREATININE, CALCIUM in the last 72 hours.  Invalid input(s): CO CBG (last 3)  Recent Labs    08/17/17 1624 08/17/17 2205 08/18/17 0653  GLUCAP 122* 101* 126*    Wt Readings from Last 3 Encounters:  08/10/17 95.3 kg (210 lb 1.6 oz)  07/21/17 113.4 kg (250 lb)  07/21/17 113.8 kg (250 lb 12.8 oz)    Physical Exam:  BP 133/82 (BP Location: Left Arm)   Pulse (!) 105   Temp 98.9 F (37.2 C) (Oral)   Resp 18   Ht 5\' 7"  (1.702 m)   Wt 95.3 kg (210 lb 1.6 oz)   SpO2 93%   BMI 32.91 kg/m  General: NAD. Vitals stable. HENT: Normocephalic. Atraumatic Eyes: EOMI. No discharge Cardiac. RRR. No JVD. Respiratory. Clear to auscultation. Unlabored.  Abdomen: good bowel sounds. nondistended Musculoskeletal: Bilateral BKA Neurological. Alert Motor: B/l UE 5/5 proximal to distal LLE: HF, KE 4+/5  RLE: HF, KE 4+/5 (stable) Skin. Left BKA incision C/D/I  Right BKA incision c/d/i Psych: Confabulation, persistent  Assessment/Plan: 1. Functional deficits secondary to bilateral BKA which require 3+ hours per day of interdisciplinary therapy in a comprehensive inpatient rehab setting. Physiatrist is providing close team supervision and 24 hour management of active medical problems listed below. Physiatrist and rehab team continue to assess barriers to discharge/monitor patient progress toward functional and medical  goals.  Function:  Bathing Bathing position   Position: Bed  Bathing parts Body parts bathed by patient: Right arm, Chest, Abdomen, Right upper leg, Left upper leg, Left arm, Front perineal area, Buttocks, Back Body parts bathed by helper: Back  Bathing assist Assist Level: Supervision or verbal cues      Upper Body Dressing/Undressing Upper body dressing   What is the patient wearing?: Pull over shirt/dress     Pull over shirt/dress - Perfomed by patient: Thread/unthread right sleeve, Thread/unthread left sleeve, Put head through opening, Pull shirt over trunk Pull over shirt/dress - Perfomed by helper: Pull shirt over trunk        Upper body assist Assist Level: Supervision or verbal cues   Set up : To obtain clothing/put away  Lower Body Dressing/Undressing Lower body dressing   What is the patient wearing?: Pants Underwear - Performed by patient: Thread/unthread right underwear leg, Thread/unthread left underwear leg, Pull underwear up/down Underwear - Performed by helper: Pull underwear up/down Pants- Performed by patient: Thread/unthread right pants leg, Thread/unthread left pants leg Pants- Performed by helper: Pull pants up/down                      Lower body assist Assist for lower body dressing: Touching or steadying assistance (Pt > 75%)      Toileting Toileting Toileting activity did not occur: No continent bowel/bladder event Toileting steps completed by patient: Performs perineal hygiene Toileting steps completed by helper: Adjust clothing prior to  toileting, Performs perineal hygiene, Adjust clothing after toileting Toileting Assistive Devices: Grab bar or rail, Prosthesis/orthosis  Toileting assist Assist level: Two helpers   Transfers Chair/bed transfer   Chair/bed transfer method: Lateral scoot Chair/bed transfer assist level: Touching or steadying assistance (Pt > 75%) Chair/bed transfer assistive device: Sliding board, Armrests Mechanical  lift: Maximove   Locomotion Ambulation Ambulation activity did not occur: Safety/medical concerns         Wheelchair   Type: Manual Max wheelchair distance: 100' Assist Level: Supervision or verbal cues  Cognition Comprehension Comprehension assist level: Understands basic 90% of the time/cues < 10% of the time  Expression Expression assist level: Expresses basic 50 - 74% of the time/requires cueing 25 - 49% of the time. Needs to repeat parts of sentences.  Social Interaction Social Interaction assist level: Interacts appropriately 90% of the time - Needs monitoring or encouragement for participation or interaction.  Problem Solving Problem solving assist level: Solves basic 25 - 49% of the time - needs direction more than half the time to initiate, plan or complete simple activities  Memory Memory assist level: Recognizes or recalls less than 25% of the time/requires cueing greater than 75% of the time    Medical Problem List and Plan: 1.  Decreased functional mobility secondary to left BKA 07/21/2017 as well as history of right BKA. Patient does use a right prosthesis   D/c today to SNF   Will see patient in 1 month for hospital follow up  2.  DVT Prophylaxis/Anticoagulation: Subcutaneous Lovenox. Monitor for any bleeding episodes 3. Pain Management/chronic pain: Soma 350 mg 3 times a day, Neurontin 1200 mg 3 times a day, MS Contin 15 mg every 12 hours, Robaxin and oxycodone as needed 4. Mood: Effexor 75 mg twice a day Trazodone 25 mg daily at bedtime as needed 5. Neuropsych: This patient is not capable of making decisions on his own behalf. 6. Skin/Wound Care: Routine skin checks 7. Fluids/Electrolytes/Nutrition: Routine I&O's   Cr. 0.82 on 1/21 8. Acute blood loss anemia. Continue iron supplement.    Hb 10.4 on 1/21   Cont to monitor 9. Diabetes mellitus and peripheral neuropathy. Hemoglobin A1c 6.5. Currently with SSI. Check blood sugars before meals and at bedtime. Diabetic  teaching. Patient on Glucophage 500 mg twice a day prior to admission. Resume as needed   Metformin 500 daily started on 1/2, d/ced due to watery stool   Monitor with increased mobility CBG (last 3)  Recent Labs    08/17/17 1624 08/17/17 2205 08/18/17 0653  GLUCAP 122* 101* 126*    Relatively controlled on 1/24 10. Hypertension. Norvasc 10 mg daily.   Hydralazine 10 TID started on 1/9, increased to 25 on 1/10, increased to 50 on 1/16, increased to 75 on 1/18   Lisinopril 2.5 added on 1/12, increased to 10 on 1/13   Relatively controlled on 1/24   Cont to monitor 11. Hyperlipidemia. Zocor 12.  Ileus: Resolved, now with loose stools   Multifactorial, including cessation of chronic opiods   Pt also recently started on metformin will hold for now   KUB reviewed, improving   C. difficile neg   Fiber added 1/13, increased on 1/15   Confounded by magnesium supplementation   Improving 13. Hyponatremia: Resolved   Cont to monitor 14. Leukocytosis: Resolved   Afebrile      Cont to monitor 15.  RUE swelling negative duplex doppler 1/5 16.  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    Ammonia WNL on 1/12, lactulose d/ced   UA unremarkable, U culture NG   Seroquel weaned on 1/15, DC'd on 1/17   CXR reviewed, showing likely atelectasis   EEG with generalized slowing   MRI consistent with Wernicke's encephalopathy    Appreciate neurology recs,signed off   Discussed with Hospitalist, notes reviewed, appreciate recs, signed off   Thiamine daily 17. Hypokalemia/Hypomagnesemia   K 4.2 on 1/21   Mag 1.7 on 1/18   Mag daily   K+ changed to 30 meq daily on 1/22   Labs ordered 18. Thrombocytopenia: Resolved   Platelets 217 on 1/21 19. Tachycardia   ECG reviewed, sinus tachycardia   Slightly improved  LOS (Days)  24 A FACE TO FACE EVALUATION WAS PERFORMED  Ankit Karis Jubanil Patel 08/18/2017 8:19 AM

## 2017-08-19 ENCOUNTER — Telehealth: Payer: Self-pay | Admitting: Physical Medicine & Rehabilitation

## 2017-08-19 NOTE — Telephone Encounter (Signed)
Previous patient of Dr. Wynn BankerKirsteins, was in hospital and saw Dr. Allena KatzPatel.  Patient will be following up with Dr. Allena KatzPatel as hospital follow up, then returning to Kirsteins if patient needs any further injections.  This was okay 'd by Dr. Wynn BankerKirsteins.

## 2017-08-21 DIAGNOSIS — D649 Anemia, unspecified: Secondary | ICD-10-CM | POA: Diagnosis not present

## 2017-08-21 DIAGNOSIS — I10 Essential (primary) hypertension: Secondary | ICD-10-CM | POA: Diagnosis not present

## 2017-08-21 DIAGNOSIS — I739 Peripheral vascular disease, unspecified: Secondary | ICD-10-CM | POA: Diagnosis not present

## 2017-08-25 ENCOUNTER — Other Ambulatory Visit: Payer: Self-pay | Admitting: Emergency Medicine

## 2017-08-25 DIAGNOSIS — E1151 Type 2 diabetes mellitus with diabetic peripheral angiopathy without gangrene: Secondary | ICD-10-CM | POA: Diagnosis not present

## 2017-08-25 DIAGNOSIS — I10 Essential (primary) hypertension: Secondary | ICD-10-CM | POA: Diagnosis not present

## 2017-08-25 DIAGNOSIS — Z89512 Acquired absence of left leg below knee: Secondary | ICD-10-CM | POA: Diagnosis not present

## 2017-08-25 DIAGNOSIS — K219 Gastro-esophageal reflux disease without esophagitis: Secondary | ICD-10-CM | POA: Diagnosis not present

## 2017-08-25 MED ORDER — SIMVASTATIN 10 MG PO TABS: 10.0000 mg | ORAL_TABLET | Freq: Every day | ORAL | 1 refills | Status: AC

## 2017-09-01 DIAGNOSIS — Z89512 Acquired absence of left leg below knee: Secondary | ICD-10-CM | POA: Diagnosis not present

## 2017-09-01 DIAGNOSIS — D649 Anemia, unspecified: Secondary | ICD-10-CM | POA: Diagnosis not present

## 2017-09-01 DIAGNOSIS — E1151 Type 2 diabetes mellitus with diabetic peripheral angiopathy without gangrene: Secondary | ICD-10-CM | POA: Diagnosis not present

## 2017-09-07 DIAGNOSIS — Z23 Encounter for immunization: Secondary | ICD-10-CM | POA: Diagnosis not present

## 2017-09-12 DIAGNOSIS — J810 Acute pulmonary edema: Secondary | ICD-10-CM | POA: Diagnosis not present

## 2017-09-12 DIAGNOSIS — E119 Type 2 diabetes mellitus without complications: Secondary | ICD-10-CM | POA: Diagnosis not present

## 2017-09-12 DIAGNOSIS — R410 Disorientation, unspecified: Secondary | ICD-10-CM | POA: Diagnosis not present

## 2017-09-12 DIAGNOSIS — G8929 Other chronic pain: Secondary | ICD-10-CM | POA: Diagnosis not present

## 2017-09-12 DIAGNOSIS — I10 Essential (primary) hypertension: Secondary | ICD-10-CM | POA: Diagnosis not present

## 2017-09-12 DIAGNOSIS — R7989 Other specified abnormal findings of blood chemistry: Secondary | ICD-10-CM | POA: Diagnosis not present

## 2017-09-12 DIAGNOSIS — R945 Abnormal results of liver function studies: Secondary | ICD-10-CM | POA: Diagnosis not present

## 2017-09-12 DIAGNOSIS — Z7982 Long term (current) use of aspirin: Secondary | ICD-10-CM | POA: Diagnosis not present

## 2017-09-12 DIAGNOSIS — G9349 Other encephalopathy: Secondary | ICD-10-CM | POA: Diagnosis not present

## 2017-09-12 DIAGNOSIS — R079 Chest pain, unspecified: Secondary | ICD-10-CM | POA: Diagnosis not present

## 2017-09-12 DIAGNOSIS — Z89512 Acquired absence of left leg below knee: Secondary | ICD-10-CM | POA: Diagnosis not present

## 2017-09-12 DIAGNOSIS — I348 Other nonrheumatic mitral valve disorders: Secondary | ICD-10-CM | POA: Diagnosis not present

## 2017-09-12 DIAGNOSIS — R451 Restlessness and agitation: Secondary | ICD-10-CM | POA: Diagnosis not present

## 2017-09-12 DIAGNOSIS — Z89511 Acquired absence of right leg below knee: Secondary | ICD-10-CM | POA: Diagnosis not present

## 2017-09-12 DIAGNOSIS — Z87891 Personal history of nicotine dependence: Secondary | ICD-10-CM | POA: Diagnosis not present

## 2017-09-12 DIAGNOSIS — E512 Wernicke's encephalopathy: Secondary | ICD-10-CM | POA: Diagnosis not present

## 2017-09-12 DIAGNOSIS — R000 Tachycardia, unspecified: Secondary | ICD-10-CM | POA: Diagnosis not present

## 2017-09-12 DIAGNOSIS — L89893 Pressure ulcer of other site, stage 3: Secondary | ICD-10-CM | POA: Diagnosis not present

## 2017-09-12 DIAGNOSIS — N390 Urinary tract infection, site not specified: Secondary | ICD-10-CM | POA: Diagnosis not present

## 2017-09-12 DIAGNOSIS — R569 Unspecified convulsions: Secondary | ICD-10-CM | POA: Diagnosis not present

## 2017-09-12 DIAGNOSIS — E785 Hyperlipidemia, unspecified: Secondary | ICD-10-CM | POA: Diagnosis not present

## 2017-09-12 DIAGNOSIS — R402441 Other coma, without documented Glasgow coma scale score, or with partial score reported, in the field [EMT or ambulance]: Secondary | ICD-10-CM | POA: Diagnosis not present

## 2017-09-12 DIAGNOSIS — G9340 Encephalopathy, unspecified: Secondary | ICD-10-CM | POA: Diagnosis not present

## 2017-09-12 DIAGNOSIS — K219 Gastro-esophageal reflux disease without esophagitis: Secondary | ICD-10-CM | POA: Diagnosis not present

## 2017-09-12 DIAGNOSIS — Z79899 Other long term (current) drug therapy: Secondary | ICD-10-CM | POA: Diagnosis not present

## 2017-09-13 ENCOUNTER — Other Ambulatory Visit: Payer: Self-pay | Admitting: Family Medicine

## 2017-09-19 MED ORDER — KETOTIFEN FUMARATE 0.025 % OP SOLN
1.00 | OPHTHALMIC | Status: DC
Start: 2017-09-19 — End: 2017-09-19

## 2017-09-19 MED ORDER — PSEUDOEPHEDRINE HCL 30 MG/5ML PO SYRP
30.00 | ORAL_SOLUTION | ORAL | Status: DC
Start: ? — End: 2017-09-19

## 2017-09-19 MED ORDER — GABAPENTIN 300 MG PO CAPS
300.00 | ORAL_CAPSULE | ORAL | Status: DC
Start: 2017-09-19 — End: 2017-09-19

## 2017-09-19 MED ORDER — ACETAMINOPHEN 325 MG PO TABS
650.00 | ORAL_TABLET | ORAL | Status: DC
Start: ? — End: 2017-09-19

## 2017-09-19 MED ORDER — POTASSIUM CHLORIDE ER 10 MEQ PO CPCR
ORAL_CAPSULE | ORAL | Status: DC
Start: 2017-09-20 — End: 2017-09-19

## 2017-09-19 MED ORDER — METOPROLOL TARTRATE 25 MG PO TABS
12.50 | ORAL_TABLET | ORAL | Status: DC
Start: 2017-09-19 — End: 2017-09-19

## 2017-09-19 MED ORDER — HALOPERIDOL 5 MG PO TABS
5.00 | ORAL_TABLET | ORAL | Status: DC
Start: 2017-09-19 — End: 2017-09-19

## 2017-09-19 MED ORDER — GENERIC EXTERNAL MEDICATION
Status: DC
Start: 2017-09-20 — End: 2017-09-19

## 2017-09-19 MED ORDER — ALUMINUM-MAGNESIUM-SIMETHICONE 200-200-20 MG/5ML PO SUSP
30.00 | ORAL | Status: DC
Start: ? — End: 2017-09-19

## 2017-09-19 MED ORDER — BENZOCAINE-MENTHOL 6-10 MG MT LOZG
1.00 | LOZENGE | OROMUCOSAL | Status: DC
Start: ? — End: 2017-09-19

## 2017-09-19 MED ORDER — LACTULOSE 10 GM/15ML PO SOLN
20.00 g | ORAL | Status: DC
Start: 2017-09-19 — End: 2017-09-19

## 2017-09-19 MED ORDER — DEXTROSE 50 % IV SOLN
12.00 g | INTRAVENOUS | Status: DC
Start: ? — End: 2017-09-19

## 2017-09-19 MED ORDER — LIDOCAINE 5 % EX OINT
TOPICAL_OINTMENT | CUTANEOUS | Status: DC
Start: ? — End: 2017-09-19

## 2017-09-19 MED ORDER — CLONIDINE HCL 0.1 MG PO TABS
.10 | ORAL_TABLET | ORAL | Status: DC
Start: ? — End: 2017-09-19

## 2017-09-19 MED ORDER — INSULIN LISPRO 100 UNIT/ML ~~LOC~~ SOLN
1.00 | SUBCUTANEOUS | Status: DC
Start: 2017-09-19 — End: 2017-09-19

## 2017-09-19 MED ORDER — LISINOPRIL 5 MG PO TABS
10.00 | ORAL_TABLET | ORAL | Status: DC
Start: 2017-09-20 — End: 2017-09-19

## 2017-09-19 MED ORDER — HYDRALAZINE HCL 20 MG/ML IJ SOLN
10.00 | INTRAMUSCULAR | Status: DC
Start: ? — End: 2017-09-19

## 2017-09-19 MED ORDER — FUROSEMIDE 40 MG PO TABS
40.00 | ORAL_TABLET | ORAL | Status: DC
Start: 2017-09-20 — End: 2017-09-19

## 2017-09-19 MED ORDER — BISACODYL 5 MG PO TBEC
10.00 | DELAYED_RELEASE_TABLET | ORAL | Status: DC
Start: ? — End: 2017-09-19

## 2017-09-19 MED ORDER — ENOXAPARIN SODIUM 40 MG/0.4ML ~~LOC~~ SOLN
40.00 | SUBCUTANEOUS | Status: DC
Start: 2017-09-19 — End: 2017-09-19

## 2017-09-19 MED ORDER — GLUCOSE 40 % PO GEL
15.00 g | ORAL | Status: DC
Start: ? — End: 2017-09-19

## 2017-09-19 MED ORDER — ASPIRIN EC 81 MG PO TBEC
81.00 | DELAYED_RELEASE_TABLET | ORAL | Status: DC
Start: 2017-09-20 — End: 2017-09-19

## 2017-09-19 MED ORDER — BENZTROPINE MESYLATE 1 MG PO TABS
.50 | ORAL_TABLET | ORAL | Status: DC
Start: 2017-09-19 — End: 2017-09-19

## 2017-09-19 MED ORDER — GENERIC EXTERNAL MEDICATION
1.00 | Status: DC
Start: 2017-09-20 — End: 2017-09-19

## 2017-09-19 MED ORDER — COLLAGENASE 250 UNIT/GM EX OINT
1.00 | TOPICAL_OINTMENT | CUTANEOUS | Status: DC
Start: 2017-09-19 — End: 2017-09-19

## 2017-09-19 MED ORDER — GENERIC EXTERNAL MEDICATION
75.00 | Status: DC
Start: 2017-09-19 — End: 2017-09-19

## 2017-09-19 MED ORDER — DOCUSATE SODIUM 100 MG PO CAPS
100.00 | ORAL_CAPSULE | ORAL | Status: DC
Start: ? — End: 2017-09-19

## 2017-09-19 MED ORDER — BENZONATATE 100 MG PO CAPS
100.00 | ORAL_CAPSULE | ORAL | Status: DC
Start: ? — End: 2017-09-19

## 2017-09-19 MED ORDER — FERROUS SULFATE 325 (65 FE) MG PO TABS
325.00 | ORAL_TABLET | ORAL | Status: DC
Start: 2017-09-19 — End: 2017-09-19

## 2017-09-19 MED ORDER — ONDANSETRON 4 MG PO TBDP
4.00 | ORAL_TABLET | ORAL | Status: DC
Start: ? — End: 2017-09-19

## 2017-09-19 MED ORDER — GENERIC EXTERNAL MEDICATION
1.00 | Status: DC
Start: ? — End: 2017-09-19

## 2017-09-19 MED ORDER — ASENAPINE MALEATE 5 MG SL SUBL
5.00 | SUBLINGUAL_TABLET | SUBLINGUAL | Status: DC
Start: ? — End: 2017-09-19

## 2017-09-19 MED ORDER — METHOCARBAMOL 500 MG PO TABS
500.00 | ORAL_TABLET | ORAL | Status: DC
Start: ? — End: 2017-09-19

## 2017-09-19 MED ORDER — AMLODIPINE BESYLATE 5 MG PO TABS
10.00 | ORAL_TABLET | ORAL | Status: DC
Start: 2017-09-20 — End: 2017-09-19

## 2017-09-19 MED ORDER — LORAZEPAM 2 MG/ML IJ SOLN
2.00 | INTRAMUSCULAR | Status: DC
Start: ? — End: 2017-09-19

## 2017-09-19 MED ORDER — SODIUM BICARBONATE 650 MG PO TABS
650.00 | ORAL_TABLET | ORAL | Status: DC
Start: 2017-09-19 — End: 2017-09-19

## 2017-09-21 DIAGNOSIS — E512 Wernicke's encephalopathy: Secondary | ICD-10-CM | POA: Diagnosis not present

## 2017-09-22 ENCOUNTER — Encounter: Payer: Self-pay | Admitting: Physical Medicine & Rehabilitation

## 2017-09-22 ENCOUNTER — Encounter: Payer: Medicare Other | Attending: Physical Medicine & Rehabilitation | Admitting: Physical Medicine & Rehabilitation

## 2017-09-22 VITALS — BP 100/63 | HR 99 | Resp 14

## 2017-09-22 DIAGNOSIS — R269 Unspecified abnormalities of gait and mobility: Secondary | ICD-10-CM

## 2017-09-22 DIAGNOSIS — E1142 Type 2 diabetes mellitus with diabetic polyneuropathy: Secondary | ICD-10-CM | POA: Diagnosis not present

## 2017-09-22 DIAGNOSIS — F419 Anxiety disorder, unspecified: Secondary | ICD-10-CM | POA: Diagnosis not present

## 2017-09-22 DIAGNOSIS — Z89511 Acquired absence of right leg below knee: Secondary | ICD-10-CM

## 2017-09-22 DIAGNOSIS — Z89512 Acquired absence of left leg below knee: Secondary | ICD-10-CM | POA: Diagnosis not present

## 2017-09-22 DIAGNOSIS — G894 Chronic pain syndrome: Secondary | ICD-10-CM | POA: Insufficient documentation

## 2017-09-22 DIAGNOSIS — K219 Gastro-esophageal reflux disease without esophagitis: Secondary | ICD-10-CM | POA: Diagnosis not present

## 2017-09-22 DIAGNOSIS — Z833 Family history of diabetes mellitus: Secondary | ICD-10-CM | POA: Diagnosis not present

## 2017-09-22 DIAGNOSIS — I10 Essential (primary) hypertension: Secondary | ICD-10-CM | POA: Diagnosis not present

## 2017-09-22 DIAGNOSIS — Z9889 Other specified postprocedural states: Secondary | ICD-10-CM | POA: Diagnosis not present

## 2017-09-22 DIAGNOSIS — E512 Wernicke's encephalopathy: Secondary | ICD-10-CM | POA: Diagnosis not present

## 2017-09-22 DIAGNOSIS — D62 Acute posthemorrhagic anemia: Secondary | ICD-10-CM

## 2017-09-22 DIAGNOSIS — E1165 Type 2 diabetes mellitus with hyperglycemia: Secondary | ICD-10-CM | POA: Diagnosis not present

## 2017-09-22 DIAGNOSIS — R293 Abnormal posture: Secondary | ICD-10-CM | POA: Diagnosis not present

## 2017-09-22 DIAGNOSIS — T8131XA Disruption of external operation (surgical) wound, not elsewhere classified, initial encounter: Secondary | ICD-10-CM

## 2017-09-22 DIAGNOSIS — Z87891 Personal history of nicotine dependence: Secondary | ICD-10-CM | POA: Insufficient documentation

## 2017-09-22 DIAGNOSIS — E876 Hypokalemia: Secondary | ICD-10-CM | POA: Diagnosis not present

## 2017-09-22 DIAGNOSIS — M1990 Unspecified osteoarthritis, unspecified site: Secondary | ICD-10-CM | POA: Diagnosis not present

## 2017-09-22 NOTE — Progress Notes (Signed)
Subjective:    Patient ID: Justin Stone, male    DOB: 05/08/1961, [age] y.o.   MRN: [id number]  HPI 57 year old right-handed male with history of diabetes mellitus, chronic pain syndrome, hypertension, and right BKA several years presents for hospital follow up after receiving CIR for right BKA with development of Wernicke's encephalopathy.   DATE OF ADMISSION:  07/25/2017 DATE OF DISCHARGE:  08/18/2017  Assistant present.  Unable to obtain history from patient.  He saw Dr. Victorino DikeHewitt. Unaware if labs are being drawn.  Unaware if CBGs are being checked. BP is controlled. No information provided from SNF regarding labs, progress. Pt went to the ED since discharge, notes reviewed, for confusion, Haldol started.   Therapies: ?Unclear DME: At facility Mobility: Wheelchair at all times.  Pain Inventory Average Pain 8 Pain Right Now 8 My pain is sharp, burning, dull and tingling  In the last 24 hours, has pain interfered with the following? General activity 0 Relation with others 0 Enjoyment of life 0 What TIME of day is your pain at its worst? morning, daytime, evening Sleep (in general) Poor  Pain is worse with: bending and sitting Pain improves with: medication and injections Relief from Meds: no selection  Mobility how many minutes can you walk? 0 do you drive?  no use a wheelchair needs help with transfers  Function I need assistance with the following:  dressing, bathing, toileting, meal prep, household duties and shopping  Neuro/Psych trouble walking  Prior Studies hospital f/u  Physicians involved in your care hospital f/u   Family History  Problem Relation Age of Onset  . Diabetes Maternal Uncle   . Diabetes Maternal Grandmother   . Colon cancer Neg Hx   . Colon polyps Neg Hx   . Rectal cancer Neg Hx   . Stomach cancer Neg Hx   . Esophageal cancer Neg Hx   . Sickle cell anemia Neg Hx    Social History   Socioeconomic History  . Marital status: Single      Spouse name: None  . Number of children: None  . Years of education: None  . Highest education level: None  Social Needs  . Financial resource strain: None  . Food insecurity - worry: None  . Food insecurity - inability: None  . Transportation needs - medical: None  . Transportation needs - non-medical: None  Occupational History  . None  Tobacco Use  . Smoking status: Former Smoker    Packs/day: 1.00    Years: 20.00    Pack years: 20.00    Types: Cigarettes    Last attempt to quit: 06/10/1986    Years since quitting: 31.3  . Smokeless tobacco: Never Used  Substance and Sexual Activity  . Alcohol use: Yes    Alcohol/week: 4.8 oz    Types: 8 Glasses of wine per week    Comment: 1 quart every 3 days - 24 oz cans  . Drug use: No  . Sexual activity: None  Other Topics Concern  . None  Social History Narrative  . None   Past Surgical History:  Procedure Laterality Date  . AMPUTATION Left 07/21/2017   Procedure: LEFT  BELOW KNEE AMPUTATION;  Surgeon: Toni ArthursHewitt, John, MD;  Location: [facility];  Service: Orthopedics;  Laterality: Left;  . HERNIA REPAIR Left    LIH  . INSERTION OF MESH N/A 06/15/2016   Procedure: INSERTION OF MESH;  Surgeon: Gaynelle AduEric Wilson, MD;  Location: [facility];  Service: General;  Laterality:  N/A;  . right leg removal     for diabetes, wears prosthesis -Right Below knee since '15  . UMBILICAL HERNIA REPAIR N/A 06/15/2016   Procedure: LAPAROSCOPIC ASSISTED REPAIR OF  UMBILICAL HERNIA;  Surgeon: Gaynelle Adu, MD;  Location: [facility];  Service: General;  Laterality: N/A;   Past Medical History:  Diagnosis Date  . Allergy    eye allergies  . Anxiety   . Arthritis   . Diabetes mellitus without complication (HCC)    takes oral meds now only  . GERD (gastroesophageal reflux disease)    past hx > 2 yrs ago  . Hepatitis    "Hepatitis C"- tx. with Harvoni- now testing negative.  . Hyperlipidemia   . Hypertension   . Neuromuscular disorder (HCC)    neuropathy  hands/ feet.  . Osteopenia   . Prosthesis adjustment    right leg- below knee(weighs 10 lbs.)   BP 100/63 (BP Location: Right Arm, Patient Position: Sitting, Cuff Size: Large)   Pulse 99   Resp 14   SpO2 97%   Opioid Risk Score:   Fall Risk Score:  `1  Depression screen PHQ 2/9  Depression screen [facility] 2/9 11/14/2015 10/14/2015 09/16/2015 08/26/2015 05/26/2015 05/06/2015  Decreased Interest 0 2 0 0 0 1  Down, Depressed, Hopeless 0 0 0 0 0 0  PHQ - 2 Score 0 2 0 0 0 1  Altered sleeping - 3 - - 1 1  Tired, decreased energy - 1 - - 0 0  Change in appetite - 0 - - 0 0  Feeling bad or failure about yourself  - 0 - - 0 0  Trouble concentrating - 0 - - 0 0  Moving slowly or fidgety/restless - 0 - - 0 0  Suicidal thoughts - 0 - - 0 0  PHQ-9 Score - 6 - - 1 2    Review of Systems  Unable to perform ROS: Other  Wernicke's encephalopathy     Objective:   Physical Exam General: NAD. Vitals stable. HENT: Normocephalic. Atraumatic Eyes: EOMI. No discharge Cardiac. RRR. No JVD. Respiratory. Clear to auscultation. Unlabored.  Abdomen: good bowel sounds. nondistended Musculoskeletal: Bilateral BKA Neurological. Alert Motor: B/l UE 5/5 proximal to distal LLE: HF, KE 4+/5  RLE: HF, KE 4+/5  Skin. Left BKA incision with medial area of serous drainage with odor Right BKA incision c/d/i Psych: Confabulation, persistent     Assessment & Plan:  57 year old right-handed male with history of diabetes mellitus, chronic pain syndrome, hypertension, and right BKA several years presents for hospital follow up after receiving CIR for right BKA with development of Wernicke's encephalopathy.   1.  Decreased functional mobility secondary to left BKA 07/21/2017 as well as history of right BKA.    Cont SNF  Will order Doxy 100 BID x 14 days for wound dehiscence  Cont therapies  Pt will require 24/7 Supervision  Cont to follow with Ortho  2. Acute blood loss anemia.    Labs per SNF  3. Diabetes  mellitus and peripheral neuropathy.   CBG checks  4. Hypertension  Cont meds  Controlled today  5. Wernicke's-Korsakoff's encephalopathy   Thiamine daily  6. Hypokalemia/Hypomagnesemia  Labs at SNF  7. Gait abnormality  Cont therapies  Cont wheelchair for safety  Meds reviewed Referrals reviewed All questions answered

## 2017-10-03 DIAGNOSIS — M1990 Unspecified osteoarthritis, unspecified site: Secondary | ICD-10-CM | POA: Diagnosis not present

## 2017-10-03 DIAGNOSIS — R293 Abnormal posture: Secondary | ICD-10-CM | POA: Diagnosis not present

## 2017-10-03 DIAGNOSIS — Z89512 Acquired absence of left leg below knee: Secondary | ICD-10-CM | POA: Diagnosis not present

## 2017-10-06 DIAGNOSIS — R296 Repeated falls: Secondary | ICD-10-CM | POA: Diagnosis not present

## 2017-10-08 ENCOUNTER — Other Ambulatory Visit: Payer: Self-pay | Admitting: Family Medicine

## 2017-10-10 DIAGNOSIS — S81802D Unspecified open wound, left lower leg, subsequent encounter: Secondary | ICD-10-CM | POA: Diagnosis not present

## 2017-10-10 DIAGNOSIS — R296 Repeated falls: Secondary | ICD-10-CM | POA: Diagnosis not present

## 2017-10-10 DIAGNOSIS — D649 Anemia, unspecified: Secondary | ICD-10-CM | POA: Diagnosis not present

## 2017-10-11 DIAGNOSIS — M1990 Unspecified osteoarthritis, unspecified site: Secondary | ICD-10-CM | POA: Diagnosis not present

## 2017-10-11 DIAGNOSIS — Z89512 Acquired absence of left leg below knee: Secondary | ICD-10-CM | POA: Diagnosis not present

## 2017-10-11 DIAGNOSIS — R293 Abnormal posture: Secondary | ICD-10-CM | POA: Diagnosis not present

## 2017-10-12 DIAGNOSIS — N2889 Other specified disorders of kidney and ureter: Secondary | ICD-10-CM | POA: Diagnosis not present

## 2017-10-12 DIAGNOSIS — D500 Iron deficiency anemia secondary to blood loss (chronic): Secondary | ICD-10-CM | POA: Diagnosis not present

## 2017-10-14 DIAGNOSIS — E871 Hypo-osmolality and hyponatremia: Secondary | ICD-10-CM | POA: Diagnosis not present

## 2017-10-14 DIAGNOSIS — E876 Hypokalemia: Secondary | ICD-10-CM | POA: Diagnosis not present

## 2017-10-17 DIAGNOSIS — Z89212 Acquired absence of left upper limb below elbow: Secondary | ICD-10-CM | POA: Diagnosis not present

## 2017-10-17 DIAGNOSIS — T8189XA Other complications of procedures, not elsewhere classified, initial encounter: Secondary | ICD-10-CM | POA: Diagnosis not present

## 2017-10-17 DIAGNOSIS — E11622 Type 2 diabetes mellitus with other skin ulcer: Secondary | ICD-10-CM | POA: Diagnosis not present

## 2017-10-17 DIAGNOSIS — L97822 Non-pressure chronic ulcer of other part of left lower leg with fat layer exposed: Secondary | ICD-10-CM | POA: Diagnosis not present

## 2017-10-18 DIAGNOSIS — M1712 Unilateral primary osteoarthritis, left knee: Secondary | ICD-10-CM | POA: Diagnosis not present

## 2017-10-18 DIAGNOSIS — E785 Hyperlipidemia, unspecified: Secondary | ICD-10-CM | POA: Diagnosis not present

## 2017-10-18 DIAGNOSIS — R293 Abnormal posture: Secondary | ICD-10-CM | POA: Diagnosis not present

## 2017-10-18 DIAGNOSIS — M1990 Unspecified osteoarthritis, unspecified site: Secondary | ICD-10-CM | POA: Diagnosis not present

## 2017-10-18 DIAGNOSIS — Z89512 Acquired absence of left leg below knee: Secondary | ICD-10-CM | POA: Diagnosis not present

## 2017-10-19 DIAGNOSIS — R7989 Other specified abnormal findings of blood chemistry: Secondary | ICD-10-CM | POA: Diagnosis not present

## 2017-10-19 DIAGNOSIS — E512 Wernicke's encephalopathy: Secondary | ICD-10-CM | POA: Diagnosis not present

## 2017-10-19 DIAGNOSIS — K7460 Unspecified cirrhosis of liver: Secondary | ICD-10-CM | POA: Diagnosis not present

## 2017-10-19 DIAGNOSIS — N289 Disorder of kidney and ureter, unspecified: Secondary | ICD-10-CM | POA: Diagnosis not present

## 2017-10-19 DIAGNOSIS — D649 Anemia, unspecified: Secondary | ICD-10-CM | POA: Diagnosis not present

## 2017-10-19 DIAGNOSIS — N2889 Other specified disorders of kidney and ureter: Secondary | ICD-10-CM | POA: Diagnosis not present

## 2017-10-20 ENCOUNTER — Encounter: Payer: Medicare Other | Attending: Physical Medicine & Rehabilitation | Admitting: Physical Medicine & Rehabilitation

## 2017-10-20 DIAGNOSIS — F419 Anxiety disorder, unspecified: Secondary | ICD-10-CM | POA: Insufficient documentation

## 2017-10-20 DIAGNOSIS — G894 Chronic pain syndrome: Secondary | ICD-10-CM | POA: Insufficient documentation

## 2017-10-20 DIAGNOSIS — E1142 Type 2 diabetes mellitus with diabetic polyneuropathy: Secondary | ICD-10-CM | POA: Insufficient documentation

## 2017-10-20 DIAGNOSIS — Z833 Family history of diabetes mellitus: Secondary | ICD-10-CM | POA: Insufficient documentation

## 2017-10-20 DIAGNOSIS — E512 Wernicke's encephalopathy: Secondary | ICD-10-CM | POA: Insufficient documentation

## 2017-10-20 DIAGNOSIS — Z9889 Other specified postprocedural states: Secondary | ICD-10-CM | POA: Insufficient documentation

## 2017-10-20 DIAGNOSIS — D62 Acute posthemorrhagic anemia: Secondary | ICD-10-CM | POA: Insufficient documentation

## 2017-10-20 DIAGNOSIS — R269 Unspecified abnormalities of gait and mobility: Secondary | ICD-10-CM | POA: Insufficient documentation

## 2017-10-20 DIAGNOSIS — Z89512 Acquired absence of left leg below knee: Secondary | ICD-10-CM | POA: Insufficient documentation

## 2017-10-20 DIAGNOSIS — K219 Gastro-esophageal reflux disease without esophagitis: Secondary | ICD-10-CM | POA: Insufficient documentation

## 2017-10-20 DIAGNOSIS — Z87891 Personal history of nicotine dependence: Secondary | ICD-10-CM | POA: Insufficient documentation

## 2017-10-20 DIAGNOSIS — Z89511 Acquired absence of right leg below knee: Secondary | ICD-10-CM | POA: Insufficient documentation

## 2017-10-20 DIAGNOSIS — E876 Hypokalemia: Secondary | ICD-10-CM | POA: Insufficient documentation

## 2017-10-20 DIAGNOSIS — I10 Essential (primary) hypertension: Secondary | ICD-10-CM | POA: Insufficient documentation

## 2017-10-21 DIAGNOSIS — Z89512 Acquired absence of left leg below knee: Secondary | ICD-10-CM | POA: Diagnosis not present

## 2017-10-21 DIAGNOSIS — M1990 Unspecified osteoarthritis, unspecified site: Secondary | ICD-10-CM | POA: Diagnosis not present

## 2017-10-21 DIAGNOSIS — R293 Abnormal posture: Secondary | ICD-10-CM | POA: Diagnosis not present

## 2017-10-21 DIAGNOSIS — D500 Iron deficiency anemia secondary to blood loss (chronic): Secondary | ICD-10-CM | POA: Diagnosis not present

## 2017-10-24 DIAGNOSIS — T8189XD Other complications of procedures, not elsewhere classified, subsequent encounter: Secondary | ICD-10-CM | POA: Diagnosis not present

## 2017-10-24 DIAGNOSIS — Z89512 Acquired absence of left leg below knee: Secondary | ICD-10-CM | POA: Diagnosis not present

## 2017-10-24 DIAGNOSIS — E11622 Type 2 diabetes mellitus with other skin ulcer: Secondary | ICD-10-CM | POA: Diagnosis not present

## 2017-10-24 DIAGNOSIS — L97822 Non-pressure chronic ulcer of other part of left lower leg with fat layer exposed: Secondary | ICD-10-CM | POA: Diagnosis not present

## 2017-11-23 DIAGNOSIS — K219 Gastro-esophageal reflux disease without esophagitis: Secondary | ICD-10-CM | POA: Diagnosis not present

## 2017-11-23 DIAGNOSIS — I10 Essential (primary) hypertension: Secondary | ICD-10-CM | POA: Diagnosis not present

## 2017-11-23 DIAGNOSIS — E1151 Type 2 diabetes mellitus with diabetic peripheral angiopathy without gangrene: Secondary | ICD-10-CM | POA: Diagnosis not present

## 2018-01-19 DIAGNOSIS — J309 Allergic rhinitis, unspecified: Secondary | ICD-10-CM | POA: Diagnosis not present

## 2018-01-19 DIAGNOSIS — R52 Pain, unspecified: Secondary | ICD-10-CM | POA: Diagnosis not present

## 2018-01-19 DIAGNOSIS — K7460 Unspecified cirrhosis of liver: Secondary | ICD-10-CM | POA: Diagnosis not present

## 2018-03-09 DIAGNOSIS — I10 Essential (primary) hypertension: Secondary | ICD-10-CM | POA: Diagnosis not present

## 2018-03-09 DIAGNOSIS — E1151 Type 2 diabetes mellitus with diabetic peripheral angiopathy without gangrene: Secondary | ICD-10-CM | POA: Diagnosis not present

## 2018-03-09 DIAGNOSIS — K219 Gastro-esophageal reflux disease without esophagitis: Secondary | ICD-10-CM | POA: Diagnosis not present

## 2018-04-25 DEATH — deceased

## 2018-06-06 ENCOUNTER — Telehealth: Payer: Self-pay | Admitting: Family Medicine

## 2018-06-06 NOTE — Telephone Encounter (Signed)
Attempted to call patient to schedule AWV. Patient did not answer. Will try to call patient at a later time. SF °

## 2021-09-17 NOTE — Progress Notes (Signed)
Letter not mailed to patient.   LOV with PCP was 2018
